# Patient Record
Sex: Male | Born: 1935
Health system: Southern US, Community
[De-identification: ages and names within clinical notes are randomized; demographics above are authoritative.]

## PROBLEM LIST (undated history)

## (undated) DIAGNOSIS — B029 Zoster without complications: Secondary | ICD-10-CM

## (undated) DIAGNOSIS — I499 Cardiac arrhythmia, unspecified: Secondary | ICD-10-CM

## (undated) DIAGNOSIS — I10 Essential (primary) hypertension: Secondary | ICD-10-CM

## (undated) DIAGNOSIS — E119 Type 2 diabetes mellitus without complications: Secondary | ICD-10-CM

## (undated) DIAGNOSIS — I480 Paroxysmal atrial fibrillation: Secondary | ICD-10-CM

## (undated) DIAGNOSIS — L0591 Pilonidal cyst without abscess: Secondary | ICD-10-CM

## (undated) DIAGNOSIS — I5032 Chronic diastolic (congestive) heart failure: Secondary | ICD-10-CM

## (undated) DIAGNOSIS — E7800 Pure hypercholesterolemia, unspecified: Secondary | ICD-10-CM

## (undated) DIAGNOSIS — H9190 Unspecified hearing loss, unspecified ear: Secondary | ICD-10-CM

## (undated) DIAGNOSIS — M1990 Unspecified osteoarthritis, unspecified site: Secondary | ICD-10-CM

## (undated) DIAGNOSIS — J189 Pneumonia, unspecified organism: Secondary | ICD-10-CM

## (undated) DIAGNOSIS — N400 Enlarged prostate without lower urinary tract symptoms: Secondary | ICD-10-CM

## (undated) DIAGNOSIS — M659 Synovitis and tenosynovitis, unspecified: Secondary | ICD-10-CM

## (undated) HISTORY — DX: Pilonidal cyst without abscess: L05.91

## (undated) HISTORY — PX: SHOULDER OPEN ROTATOR CUFF REPAIR: SHX2407

## (undated) HISTORY — PX: CHOLECYSTECTOMY OPEN: SUR202

## (undated) HISTORY — PX: VASECTOMY: SHX75

## (undated) HISTORY — PX: TONSILLECTOMY: SUR1361

## (undated) HISTORY — PX: JOINT REPLACEMENT: SHX530

## (undated) HISTORY — PX: EYE SURGERY: SHX253

## (undated) HISTORY — PX: APPENDECTOMY: SHX54

---

## 1999-11-03 ENCOUNTER — Encounter: Admission: RE | Admit: 1999-11-03 | Discharge: 1999-11-03 | Payer: Self-pay | Admitting: Orthopedic Surgery

## 1999-11-10 ENCOUNTER — Ambulatory Visit (HOSPITAL_BASED_OUTPATIENT_CLINIC_OR_DEPARTMENT_OTHER): Admission: RE | Admit: 1999-11-10 | Discharge: 1999-11-11 | Payer: Self-pay | Admitting: Orthopedic Surgery

## 2005-08-14 HISTORY — PX: COLONOSCOPY W/ BIOPSIES: SHX1374

## 2005-09-01 ENCOUNTER — Ambulatory Visit (HOSPITAL_COMMUNITY): Admission: RE | Admit: 2005-09-01 | Discharge: 2005-09-01 | Payer: Self-pay | Admitting: Gastroenterology

## 2005-09-01 ENCOUNTER — Encounter (INDEPENDENT_AMBULATORY_CARE_PROVIDER_SITE_OTHER): Payer: Self-pay | Admitting: *Deleted

## 2010-12-23 ENCOUNTER — Ambulatory Visit: Payer: Self-pay | Admitting: Cardiology

## 2011-01-01 ENCOUNTER — Telehealth (INDEPENDENT_AMBULATORY_CARE_PROVIDER_SITE_OTHER): Payer: Self-pay | Admitting: *Deleted

## 2011-01-05 ENCOUNTER — Encounter: Payer: Self-pay | Admitting: Internal Medicine

## 2011-01-05 ENCOUNTER — Encounter (HOSPITAL_COMMUNITY)
Admission: RE | Admit: 2011-01-05 | Discharge: 2011-01-13 | Payer: Self-pay | Source: Home / Self Care | Attending: Cardiology | Admitting: Cardiology

## 2011-01-05 ENCOUNTER — Ambulatory Visit: Admission: RE | Admit: 2011-01-05 | Discharge: 2011-01-05 | Payer: Self-pay | Source: Home / Self Care

## 2011-01-15 NOTE — Assessment & Plan Note (Signed)
Summary: Cardiology Nuclear Testing  Nuclear Med Background Indications for Stress Test: Evaluation for Ischemia    History Comments: No documented CAD  Symptoms: DOE    Nuclear Pre-Procedure Cardiac Risk Factors: Hypertension, Lipids Caffeine/Decaff Intake: none NPO After: 4:30 AM Lungs: Clear IV 0.9% NS with Angio Cath: 20g     IV Site: R Hand IV Started by: Cathlyn Parsons, RN Chest Size (in) 46     Height (in): 70 Weight (lb): 204 BMI: 29.38 Tech Comments: Patient held all diabetic meds this am.  BS 146 at 0920.    Nuclear Med Study 1 or 2 day study:  1 day     Stress Test Type:  Stress Reading MD:  Dietrich Pates, MD     Referring MD:  Cassell Clement, MD Resting Radionuclide:  Technetium 54m Tetrofosmin     Resting Radionuclide Dose:  10.7 mCi  Stress Radionuclide:  Technetium 44m Tetrofosmin     Stress Radionuclide Dose:  33.0 mCi   Stress Protocol Exercise Time (min):  4:15 min     Max HR:  137 bpm     Predicted Max HR:  135 bpm  Max Systolic BP: 180 mm Hg     Percent Max HR:  101.48 %     METS: 5.8 Rate Pressure Product:  84132    Stress Test Technologist:  Rea [employer], CMA-N     Nuclear Technologist:  Doyne Keel, CNMT  Rest Procedure  Myocardial perfusion imaging was performed at rest 45 minutes following the intravenous administration of Technetium 49m Tetrofosmin.  Stress Procedure  The patient exercised for 4:15.  The patient stopped due to fatigue and dyspnea .  He denied any chest pain.  There were diagnostic ST-T wave changes and occasional PAC's and fusion beats.  Technetium 41m Tetrofosmin was injected at peak exercise and myocardial perfusion imaging was performed after a brief delay.  QPS Raw Data Images:  Images were motion corrected.  Soft tissue (diaphragm, breast) surround heart. Stress Images:  Normal homogeneous uptake in all areas of the myocardium. Rest Images:  Normal homogeneous uptake in all areas of the myocardium. Subtraction (SDS):   Normal Transient Ischemic Dilatation:  1.04  (Normal <1.22)  Lung/Heart Ratio:  0.26  (Normal <0.45)  Quantitative Gated Spect Images QGS EDV:  102 ml QGS ESV:  44 ml QGS EF:  57 %   Overall Impression  Exercise Capacity: Poor exercise capacity. BP Response: Normal blood pressure response. Clinical Symptoms: No chest pain ECG Impression: 1 to 2 mm flat ST depression in lease II, III, AVF; V5, V6.  Near normalized by 1 min recovery Overall Impression: Electrically positive for ischemia.  Myoview with normal perfusion.  Appended Document: Cardiology Nuclear Testing copy sent brackbill

## 2011-01-15 NOTE — Progress Notes (Signed)
Summary: Nuc Pre-Procedure  Phone Note Outgoing Call Call back at [facility] Phone [phone]   Call placed by: Antionette Char RN,  January 01, 2011 2:37 PM Call placed to: Patient Reason for Call: Confirm/change Appt Summary of Call: Left message with information on Myoview Information Sheet (see scanned document for details).      Nuclear Med Background Indications for Stress Test: Evaluation for Ischemia     Symptoms: Dizziness, DOE, SOB    Nuclear Pre-Procedure Cardiac Risk Factors: Hypertension, Lipids

## 2011-04-18 ENCOUNTER — Emergency Department (HOSPITAL_COMMUNITY)
Admission: EM | Admit: 2011-04-18 | Discharge: 2011-04-18 | Disposition: A | Discharge status: Home / Self Care | Payer: Medicare Other | Attending: Emergency Medicine | Admitting: Emergency Medicine

## 2011-04-18 DIAGNOSIS — Z79899 Other long term (current) drug therapy: Secondary | ICD-10-CM | POA: Insufficient documentation

## 2011-04-18 DIAGNOSIS — M25529 Pain in unspecified elbow: Secondary | ICD-10-CM | POA: Insufficient documentation

## 2011-04-18 DIAGNOSIS — S5000XA Contusion of unspecified elbow, initial encounter: Secondary | ICD-10-CM | POA: Insufficient documentation

## 2011-04-18 DIAGNOSIS — Y9229 Other specified public building as the place of occurrence of the external cause: Secondary | ICD-10-CM | POA: Insufficient documentation

## 2011-04-18 DIAGNOSIS — E7800 Pure hypercholesterolemia, unspecified: Secondary | ICD-10-CM | POA: Insufficient documentation

## 2011-04-18 DIAGNOSIS — S60229A Contusion of unspecified hand, initial encounter: Secondary | ICD-10-CM | POA: Insufficient documentation

## 2011-04-18 DIAGNOSIS — S6990XA Unspecified injury of unspecified wrist, hand and finger(s), initial encounter: Secondary | ICD-10-CM | POA: Insufficient documentation

## 2011-04-18 DIAGNOSIS — IMO0002 Reserved for concepts with insufficient information to code with codable children: Secondary | ICD-10-CM | POA: Insufficient documentation

## 2011-04-18 DIAGNOSIS — S60219A Contusion of unspecified wrist, initial encounter: Secondary | ICD-10-CM | POA: Insufficient documentation

## 2011-04-18 DIAGNOSIS — S59909A Unspecified injury of unspecified elbow, initial encounter: Secondary | ICD-10-CM | POA: Insufficient documentation

## 2011-04-18 DIAGNOSIS — E119 Type 2 diabetes mellitus without complications: Secondary | ICD-10-CM | POA: Insufficient documentation

## 2011-04-18 DIAGNOSIS — I10 Essential (primary) hypertension: Secondary | ICD-10-CM | POA: Insufficient documentation

## 2011-04-18 LAB — GLUCOSE, CAPILLARY

## 2011-05-01 NOTE — Op Note (Signed)
NAMEMIRO, BALDERSON              ACCOUNT NO.:  [id number]   MEDICAL RECORD NO.:  [id number]          PATIENT TYPE:  AMB   LOCATION:  ENDO                         FACILITY:  [facility]   PHYSICIAN:  Graylin Shiver, M.D.   DATE OF BIRTH:  04-26-1924   DATE OF PROCEDURE:  09/01/2005  DATE OF DISCHARGE:                                 OPERATIVE REPORT   PROCEDURE:  Colonoscopy with biopsy.   INDICATIONS:  Screening.   Informed consent was obtained after explanation of the risks of bleeding,  infection and perforation.   PREMEDICATION:  Fentanyl 75 mcg IV, Versed 5 mg IV.   PROCEDURE:  With the patient in the left lateral decubitus position, a  rectal exam was performed.  No masses were felt.  The Olympus colonoscope  was inserted into the rectum and advanced around colon to the cecum.  Cecal  landmarks were identified.  The cecum looked normal.  In the descending  colon there were few scattered diverticula.  The transverse colon looked  normal.  The descending colon and sigmoid revealed moderate diverticulosis.  The rectum revealed a small 3 mm polyp that was biopsied off with cold  forceps.  He tolerated the procedure well without complications.   IMPRESSION:  1.  Diverticulosis.  2.  Small rectal polyp, biopsied off.   PLAN:  The pathology will be checked.           ______________________________  Graylin Shiver, M.D.     SFG/MEDQ  D:  09/01/2005  T:  09/01/2005  Job:  [id number]

## 2011-11-10 ENCOUNTER — Other Ambulatory Visit: Payer: Self-pay | Admitting: Orthopedic Surgery

## 2012-01-19 ENCOUNTER — Other Ambulatory Visit: Payer: Self-pay | Admitting: Orthopedic Surgery

## 2012-01-25 ENCOUNTER — Encounter (HOSPITAL_COMMUNITY): Payer: Self-pay | Admitting: Pharmacy Technician

## 2012-01-28 ENCOUNTER — Ambulatory Visit (HOSPITAL_COMMUNITY)
Admission: RE | Admit: 2012-01-28 | Discharge: 2012-01-28 | Disposition: A | Discharge status: Home / Self Care | Payer: Medicare Other | Source: Ambulatory Visit | Attending: Orthopedic Surgery | Admitting: Orthopedic Surgery

## 2012-01-28 ENCOUNTER — Encounter (HOSPITAL_COMMUNITY): Payer: Self-pay

## 2012-01-28 ENCOUNTER — Other Ambulatory Visit: Payer: Self-pay

## 2012-01-28 ENCOUNTER — Encounter (HOSPITAL_COMMUNITY)
Admission: RE | Admit: 2012-01-28 | Discharge: 2012-01-28 | Disposition: A | Discharge status: Home / Self Care | Payer: Medicare Other | Source: Ambulatory Visit | Attending: Orthopedic Surgery | Admitting: Orthopedic Surgery

## 2012-01-28 DIAGNOSIS — Z01818 Encounter for other preprocedural examination: Secondary | ICD-10-CM | POA: Insufficient documentation

## 2012-01-28 DIAGNOSIS — Z79899 Other long term (current) drug therapy: Secondary | ICD-10-CM | POA: Insufficient documentation

## 2012-01-28 DIAGNOSIS — E119 Type 2 diabetes mellitus without complications: Secondary | ICD-10-CM | POA: Insufficient documentation

## 2012-01-28 DIAGNOSIS — I10 Essential (primary) hypertension: Secondary | ICD-10-CM | POA: Insufficient documentation

## 2012-01-28 DIAGNOSIS — M1710 Unilateral primary osteoarthritis, unspecified knee: Secondary | ICD-10-CM | POA: Insufficient documentation

## 2012-01-28 DIAGNOSIS — Z01810 Encounter for preprocedural cardiovascular examination: Secondary | ICD-10-CM | POA: Insufficient documentation

## 2012-01-28 DIAGNOSIS — Z01812 Encounter for preprocedural laboratory examination: Secondary | ICD-10-CM | POA: Insufficient documentation

## 2012-01-28 HISTORY — DX: Pure hypercholesterolemia, unspecified: E78.00

## 2012-01-28 HISTORY — DX: Zoster without complications: B02.9

## 2012-01-28 HISTORY — DX: Benign prostatic hyperplasia without lower urinary tract symptoms: N40.0

## 2012-01-28 HISTORY — DX: Unspecified osteoarthritis, unspecified site: M19.90

## 2012-01-28 HISTORY — DX: Unspecified hearing loss, unspecified ear: H91.90

## 2012-01-28 LAB — CBC
Hemoglobin: 11.1 g/dL — ABNORMAL LOW (ref 13.0–17.0)
MCV: 92.3 fL (ref 78.0–100.0)
Platelets: 238 10*3/uL (ref 150–400)
RBC: 3.79 MIL/uL — ABNORMAL LOW (ref 4.22–5.81)
WBC: 5.8 10*3/uL (ref 4.0–10.5)

## 2012-01-28 LAB — COMPREHENSIVE METABOLIC PANEL
ALT: 18 U/L (ref 0–53)
AST: 20 U/L (ref 0–37)
CO2: 28 mEq/L (ref 19–32)
Chloride: 104 mEq/L (ref 96–112)
GFR calc non Af Amer: 49 mL/min — ABNORMAL LOW (ref >90)
Sodium: 140 mEq/L (ref 135–145)
Total Bilirubin: 0.7 mg/dL (ref 0.3–1.2)

## 2012-01-28 LAB — URINALYSIS, ROUTINE W REFLEX MICROSCOPIC
Bilirubin Urine: NEGATIVE
Glucose, UA: NEGATIVE mg/dL
Hgb urine dipstick: NEGATIVE
Ketones, ur: NEGATIVE mg/dL
Leukocytes, UA: NEGATIVE
pH: 5.5 (ref 5.0–8.0)

## 2012-01-28 LAB — SURGICAL PCR SCREEN: Staphylococcus aureus: NEGATIVE

## 2012-01-28 NOTE — Pre-Procedure Instructions (Signed)
PT'S BUN ELEVATED--FAITH AT DR. ALUISIO'S OFFICE NOTIFIED.

## 2012-01-28 NOTE — Pre-Procedure Instructions (Signed)
PREOP EKG, CXR, CBC, CMET, PT, PTT, UA WERE DONE TODAY AT [facility]. MEDICAL CLEARANCE FOR SURGERY ON CHART FROM DR. DONNA GATES. NUCLEAR STRESS TEST REPORT 01/05/11 ON CHART FROM [facility] "MYOVIEW WITH NORMAL PERFUSION"  "ELECTRICALLY POSITIVE FOR ISCHEMIA"--PT STATES HE WAS TOLD NO PROBLEMS WITH HIS HEART.

## 2012-01-28 NOTE — Patient Instructions (Signed)
20 Daniel Cooke  01/28/2012   Your procedure is scheduled on:  Monday 2/25  AT 7:15 AM  Report to [facility] at 5:15 AM.  Call this number if you have problems the morning of surgery: [phone]   Remember:DO NOT TAKE ANY DIABETIC MEDICATIONS THE AM OF YOUR SURGERY   Do not eat food OR DRINK ANYTHING AFTER MIDNIGHT THE NIGHT BEFORE YOUR SURGEY.    Take these medicines the morning of surgery with A SIP OF WATER: NO MEDICINES TO TAKE THE AM OF SURGERY.   Do not wear jewelry  Do not wear lotions    Do not bring valuables to the hospital.  Contacts, dentures or bridgework may not be worn into surgery.  Leave suitcase in the car. After surgery it may be brought to your room.  For patients admitted to the hospital, checkout time is 11:00 AM the day of discharge.   Patients discharged the day of surgery will not be allowed to drive home.    Special Instructions: CHG Shower Use Special Wash: 1/2 bottle night before surgery and 1/2 bottle morning of surgery.   Please read over the following fact sheets that you were given: Blood Transfusion Information and MRSA Information AND INCENTIVE SPIROMETER INFORMATION

## 2012-02-07 ENCOUNTER — Other Ambulatory Visit: Payer: Self-pay | Admitting: Orthopedic Surgery

## 2012-02-07 NOTE — H&P (Signed)
Daniel Cooke  DOB: June 13, 1924 Married / Language: English / Race: White / Male  Date of Admission:  02/08/2012  Chief Complaint:  Left knee pain  History of Present Illness  The patient is a 78 year old male who comes in today for a preoperative History and Physical. The patient is scheduled for a left total knee arthroplasty to be performed by Dr. Gus Rankin. Aluisio, MD at [facility] on 01/19/2012. The patient is a 78 year old male who presents with knee complaints. The patient is seen today for a second opinion. The patient reports left knee (worse) and right knee symptoms including: pain, swelling, instability, stiffness and soreness without any known injury.The patient feels that the symptoms are worsening. Previous work-up for this problem has included knee x-rays. Past treatment for this problem has included intra-articular injection of corticosteroids (and visco ). Mr. Maertens has been treated previously at [facility] by Dr. Farris Has. He has had multiple cortisone injections in both knees and multiple Visco supplement series in both knees. His left knee is currently more symptomatic than the right. It hurts at most times with most activities. Occasionally he has pain at night. It is limiting what he can and cannot do. He would like to go on walks, but doesn't. He has significant longevity in his family. His father lived past 100. His goal is to do the same. He feels like the knees are hampering him from being more active. He would like to proceed with surgery. They have been treated conservatively in the past for the above stated problem and despite conservative measures, they continue to have progressive pain and severe functional limitations and dysfunction. They have failed non-operative management including home exercise, medications, and injections. It is felt that they would benefit from undergoing total joint replacement. Risks and benefits of the procedure have  been discussed with the patient and they elect to proceed with surgery. There are no active contraindications to surgery such as ongoing infection or rapidly progressive neurological disease.  Allergies  Codeine/Codeine Derivatives. Nausea. "crazy"  Medication History  GlipiZIDE (10MG  Tablet, Oral) Active. MetFORMIN HCl (1000MG  Tablet, Oral) Active. Pioglitazone HCl (15MG  Tablet, Oral) Active. Terazosin HCl (5MG  Capsule, Oral) Active. Simvastatin (20MG  Tablet, Oral) Active. Lisinopril (20MG  Tablet, Oral) Active. Aspirin Adult Low Strength (81MG  Tablet DR, Oral) Active.  Past Surgical History  Gallbladder Surgery. open Rotator Cuff Repair. left Tonsillectomy Appendectomy Arthroscopy of Knee. right Cataract Surgery. left Vasectomy  Problem List/Past Medical  Osteoarthritis, Knee Shingles Measles Mumps Diabetes Mellitus, Type II Hypercholesterolemia  Family History  Diabetes Mellitus. mother Heart Disease. mother  Social History  Exercise. Exercises weekly; does running / walking Illicit drug use. no Living situation. live with spouse Current work status. working part time Drug/Alcohol Rehab (Currently). no Drug/Alcohol Rehab (Previously). no Tobacco use. never smoker Marital status. married Number of flights of stairs before winded. 2-3 Pain Contract. no Children. 3 Post-Surgical Plans. Plan to go home.  Review of Systems  General:Not Present- Chills, Fever, Night Sweats, Fatigue, Weight Gain, Weight Loss and Memory Loss. Skin:Not Present- Hives, Itching, Rash, Eczema and Lesions. HEENT:Not Present- Tinnitus, Headache, Double Vision, Visual Loss, Hearing Loss and Dentures. Respiratory:Not Present- Shortness of breath with exertion, Shortness of breath at rest, Allergies, Coughing up blood and Chronic Cough. Cardiovascular:Not Present- Chest Pain, Racing/skipping heartbeats, Difficulty Breathing Lying Down, Murmur, Swelling and  Palpitations. Gastrointestinal:Not Present- Bloody Stool, Heartburn, Abdominal Pain, Vomiting, Nausea, Constipation, Diarrhea, Difficulty Swallowing, Jaundice and Loss of appetitie.  Male Genitourinary:Not Present- Urinary frequency, Blood in Urine, Weak urinary stream, Discharge, Flank Pain, Incontinence, Painful Urination, Urgency, Urinary Retention and Urinating at Night. Musculoskeletal:Not Present- Muscle Weakness, Muscle Pain, Joint Swelling, Joint Pain, Back Pain, Morning Stiffness and Spasms. Neurological:Not Present- Tremor, Dizziness, Blackout spells, Paralysis, Difficulty with balance and Weakness. Psychiatric:Not Present- Insomnia.  Vitals  Weight: 209 lb Height: 70 in Body Surface Area: 2.16 m Body Mass Index: 29.99 kg/m Pulse: 78 (Regular) Resp.: 20 (Unlabored) BP: 136/54 (Sitting, Left Arm, Standard)  Physical Exam The physical exam findings are as follows: Patient is an 78 year old male with continued knee pain. Patient is accompanied today by his wife.  General Mental Status - Alert, cooperative and good historian. General Appearance- pleasant. Not in acute distress. Orientation- Oriented X3. Build & Nutrition- Well nourished and Well developed. Mental Status- cooperative.  Head and Neck Head- normocephalic, atraumatic . Neck Global Assessment- bruit auscultated on the right, bruit auscultated on the left and supple. Carotid Arteries- Left- bruit (scant trace). Right- bruit (faint).  Eye Pupil- Bilateral- Regular and Round. Motion- Bilateral- EOMI. wears glasses Ears  Bilateral hearing aids  Chest and Lung Exam Auscultation: Breath sounds:- clear at anterior chest wall and - clear at posterior chest wall. Adventitious sounds:- No Adventitious sounds.  Cardiovascular Auscultation:Rhythm- Regular rate and rhythm. Heart Sounds- S1 WNL and S2 WNL. Murmurs & Other Heart Sounds: Murmur 1:Location- Aortic Area and  Sternal Border - Left. Timing- Early systolic. Grade- II/VI. Character- Low pitched.  Abdomen Palpation/Percussion:Tenderness- Abdomen is non-tender to palpation. Rigidity (guarding)- Abdomen is soft. Auscultation:Auscultation of the abdomen reveals - Bowel sounds normal.  Male Genitourinary  Not done, not pertinent to present illness  Musculoskeletal Well developed male, alert and oriented in no apparent distress. Evaluation of his hips, normal range of motion with no discomfort. Both knees show no effusion. Left knee shows varus deformity. Range is about 7 to 110. There is marked crepitus on range of motion of the knee. He is somewhat tender medial greater than lateral. There is no instability noted. Pulse, sensation, and motor are intact. Right knee - no effusion. No deformity. Range is 5 to 125, moderate crepitus on range of motion, tender medial greater than lateral. No instability.  RADIOGRAPHS: AP of both knees and lateral show both knees have advanced arthritis, worse certainly on the left than the right. He has bone on bone medial and patellofemoral of both knees with varus deformities and with large osteophytes present.  Assessment & Plan Osteoarthritis, Knee Impression: Left greater than Right Knee  Patient is for a Left Total Knee Replacement and a Right Knee Cortisone Injection by Dr. Lequita Halt.  Plan is to go home after the hospital stay.  PCP - Dr. Shaune Pollack - Patient has been seen preoperatively by Dr. Kevan Ny and felt to be stable for surgery. Dr. Kevan Ny recommended that he not take his diabetic meds the day of surgery.  Avel Peace, PA-C

## 2012-02-08 ENCOUNTER — Encounter (HOSPITAL_COMMUNITY): Payer: Self-pay | Admitting: Anesthesiology

## 2012-02-08 ENCOUNTER — Encounter (HOSPITAL_COMMUNITY): Payer: Self-pay

## 2012-02-08 ENCOUNTER — Encounter (HOSPITAL_COMMUNITY)
Admission: RE | Disposition: A | Discharge status: Home Health | Payer: Self-pay | Source: Ambulatory Visit | Attending: Orthopedic Surgery

## 2012-02-08 ENCOUNTER — Inpatient Hospital Stay (HOSPITAL_COMMUNITY): Payer: Medicare Other | Admitting: Anesthesiology

## 2012-02-08 ENCOUNTER — Inpatient Hospital Stay (HOSPITAL_COMMUNITY)
Admission: RE | Admit: 2012-02-08 | Discharge: 2012-02-11 | DRG: 470 | Disposition: A | Discharge status: Home Health | Payer: Medicare Other | Source: Ambulatory Visit | Attending: Orthopedic Surgery | Admitting: Orthopedic Surgery

## 2012-02-08 ENCOUNTER — Other Ambulatory Visit: Payer: Self-pay

## 2012-02-08 DIAGNOSIS — D62 Acute posthemorrhagic anemia: Secondary | ICD-10-CM | POA: Diagnosis not present

## 2012-02-08 DIAGNOSIS — H9190 Unspecified hearing loss, unspecified ear: Secondary | ICD-10-CM | POA: Diagnosis present

## 2012-02-08 DIAGNOSIS — R112 Nausea with vomiting, unspecified: Secondary | ICD-10-CM | POA: Diagnosis not present

## 2012-02-08 DIAGNOSIS — Z96659 Presence of unspecified artificial knee joint: Secondary | ICD-10-CM

## 2012-02-08 DIAGNOSIS — E119 Type 2 diabetes mellitus without complications: Secondary | ICD-10-CM | POA: Diagnosis present

## 2012-02-08 DIAGNOSIS — R339 Retention of urine, unspecified: Secondary | ICD-10-CM | POA: Diagnosis not present

## 2012-02-08 DIAGNOSIS — M1710 Unilateral primary osteoarthritis, unspecified knee: Principal | ICD-10-CM | POA: Diagnosis present

## 2012-02-08 DIAGNOSIS — R7989 Other specified abnormal findings of blood chemistry: Secondary | ICD-10-CM | POA: Diagnosis not present

## 2012-02-08 DIAGNOSIS — M179 Osteoarthritis of knee, unspecified: Secondary | ICD-10-CM | POA: Diagnosis present

## 2012-02-08 DIAGNOSIS — I10 Essential (primary) hypertension: Secondary | ICD-10-CM | POA: Diagnosis present

## 2012-02-08 HISTORY — PX: TOTAL KNEE ARTHROPLASTY: SHX125

## 2012-02-08 LAB — ABO/RH: ABO/RH(D): A POS

## 2012-02-08 LAB — GLUCOSE, CAPILLARY
Glucose-Capillary: 73 mg/dL (ref 70–99)
Glucose-Capillary: 233 mg/dL — ABNORMAL HIGH (ref 70–99)

## 2012-02-08 LAB — TYPE AND SCREEN

## 2012-02-08 SURGERY — ARTHROPLASTY, KNEE, TOTAL
Anesthesia: Spinal | Site: Knee | Laterality: Left | Wound class: Clean

## 2012-02-08 MED ORDER — WARFARIN VIDEO
Freq: Once | Status: AC
  Administered 2012-02-09: 12:00:00

## 2012-02-08 MED ORDER — ACETAMINOPHEN 325 MG PO TABS
650.0000 mg | ORAL_TABLET | Freq: Four times a day (QID) | ORAL | Status: DC | PRN
  Administered 2012-02-09: 650 mg via ORAL
  Filled 2012-02-08: qty 2

## 2012-02-08 MED ORDER — CEFAZOLIN SODIUM 1-5 GM-% IV SOLN
1.0000 g | Freq: Four times a day (QID) | INTRAVENOUS | Status: AC
  Administered 2012-02-08 – 2012-02-09 (×3): 1 g via INTRAVENOUS
  Filled 2012-02-08 (×3): qty 50

## 2012-02-08 MED ORDER — WARFARIN SODIUM 2.5 MG PO TABS
2.5000 mg | ORAL_TABLET | Freq: Once | ORAL | Status: AC
  Administered 2012-02-08: 2.5 mg via ORAL
  Filled 2012-02-08: qty 1

## 2012-02-08 MED ORDER — INSULIN ASPART 100 UNIT/ML ~~LOC~~ SOLN
0.0000 [IU] | Freq: Three times a day (TID) | SUBCUTANEOUS | Status: DC
  Administered 2012-02-08: 5 [IU] via SUBCUTANEOUS
  Administered 2012-02-08 – 2012-02-09 (×3): 2 [IU] via SUBCUTANEOUS
  Administered 2012-02-09 – 2012-02-10 (×3): 3 [IU] via SUBCUTANEOUS
  Administered 2012-02-11 (×2): 2 [IU] via SUBCUTANEOUS
  Filled 2012-02-08: qty 3

## 2012-02-08 MED ORDER — MENTHOL 3 MG MT LOZG
1.0000 | LOZENGE | OROMUCOSAL | Status: DC | PRN
  Filled 2012-02-08: qty 9

## 2012-02-08 MED ORDER — LACTATED RINGERS IV SOLN
INTRAVENOUS | Status: DC | PRN
  Administered 2012-02-08 (×2): via INTRAVENOUS

## 2012-02-08 MED ORDER — ACETAMINOPHEN 10 MG/ML IV SOLN
1000.0000 mg | Freq: Four times a day (QID) | INTRAVENOUS | Status: AC
  Administered 2012-02-08 – 2012-02-09 (×4): 1000 mg via INTRAVENOUS
  Filled 2012-02-08 (×4): qty 100

## 2012-02-08 MED ORDER — SODIUM CHLORIDE 0.9 % IV SOLN: INTRAVENOUS | Status: DC

## 2012-02-08 MED ORDER — TRAMADOL HCL 50 MG PO TABS
50.0000 mg | ORAL_TABLET | Freq: Four times a day (QID) | ORAL | Status: DC | PRN
  Administered 2012-02-09 – 2012-02-10 (×3): 100 mg via ORAL
  Administered 2012-02-10: 50 mg via ORAL
  Administered 2012-02-10 – 2012-02-11 (×3): 100 mg via ORAL
  Filled 2012-02-08 (×3): qty 2
  Filled 2012-02-08: qty 1
  Filled 2012-02-08 (×3): qty 2

## 2012-02-08 MED ORDER — FENTANYL CITRATE 0.05 MG/ML IJ SOLN
INTRAMUSCULAR | Status: DC | PRN
  Administered 2012-02-08: 50 ug via INTRAVENOUS

## 2012-02-08 MED ORDER — DOCUSATE SODIUM 100 MG PO CAPS
100.0000 mg | ORAL_CAPSULE | Freq: Two times a day (BID) | ORAL | Status: DC
  Administered 2012-02-08 – 2012-02-11 (×7): 100 mg via ORAL
  Filled 2012-02-08 (×8): qty 1

## 2012-02-08 MED ORDER — SIMVASTATIN 20 MG PO TABS
20.0000 mg | ORAL_TABLET | Freq: Every day | ORAL | Status: DC
  Administered 2012-02-08 – 2012-02-10 (×3): 20 mg via ORAL
  Filled 2012-02-08 (×4): qty 1

## 2012-02-08 MED ORDER — FLEET ENEMA 7-19 GM/118ML RE ENEM: 1.0000 | ENEMA | Freq: Once | RECTAL | Status: AC | PRN

## 2012-02-08 MED ORDER — METFORMIN HCL 500 MG PO TABS
1000.0000 mg | ORAL_TABLET | Freq: Two times a day (BID) | ORAL | Status: DC
  Administered 2012-02-08: 1000 mg via ORAL
  Filled 2012-02-08 (×3): qty 2

## 2012-02-08 MED ORDER — SODIUM CHLORIDE 0.9 % IV SOLN
INTRAVENOUS | Status: DC
  Administered 2012-02-08 – 2012-02-10 (×4): via INTRAVENOUS

## 2012-02-08 MED ORDER — PIOGLITAZONE HCL 15 MG PO TABS
15.0000 mg | ORAL_TABLET | Freq: Every day | ORAL | Status: DC
  Administered 2012-02-08 – 2012-02-10 (×3): 15 mg via ORAL
  Filled 2012-02-08 (×4): qty 1

## 2012-02-08 MED ORDER — BUPIVACAINE IN DEXTROSE 0.75-8.25 % IT SOLN
INTRATHECAL | Status: DC | PRN
  Administered 2012-02-08: 2 mL via INTRATHECAL

## 2012-02-08 MED ORDER — POLYETHYLENE GLYCOL 3350 17 G PO PACK
17.0000 g | PACK | Freq: Every day | ORAL | Status: DC | PRN
  Filled 2012-02-08: qty 1

## 2012-02-08 MED ORDER — METOCLOPRAMIDE HCL 5 MG/ML IJ SOLN
5.0000 mg | Freq: Three times a day (TID) | INTRAMUSCULAR | Status: DC | PRN
  Administered 2012-02-08 – 2012-02-09 (×2): 10 mg via INTRAVENOUS
  Filled 2012-02-08 (×2): qty 2

## 2012-02-08 MED ORDER — BISACODYL 10 MG RE SUPP: 10.0000 mg | Freq: Every day | RECTAL | Status: DC | PRN

## 2012-02-08 MED ORDER — ACETAMINOPHEN 10 MG/ML IV SOLN
1000.0000 mg | Freq: Once | INTRAVENOUS | Status: AC
  Administered 2012-02-08: 1000 mg via INTRAVENOUS

## 2012-02-08 MED ORDER — LACTATED RINGERS IV SOLN: INTRAVENOUS | Status: DC

## 2012-02-08 MED ORDER — DIPHENHYDRAMINE HCL 12.5 MG/5ML PO ELIX: 12.5000 mg | ORAL_SOLUTION | ORAL | Status: DC | PRN

## 2012-02-08 MED ORDER — FENTANYL CITRATE 0.05 MG/ML IJ SOLN: 25.0000 ug | INTRAMUSCULAR | Status: DC | PRN

## 2012-02-08 MED ORDER — PHENOL 1.4 % MT LIQD
1.0000 | OROMUCOSAL | Status: DC | PRN
  Filled 2012-02-08: qty 177

## 2012-02-08 MED ORDER — CEFAZOLIN SODIUM-DEXTROSE 2-3 GM-% IV SOLR
2.0000 g | Freq: Once | INTRAVENOUS | Status: AC
  Administered 2012-02-08: 2 g via INTRAVENOUS

## 2012-02-08 MED ORDER — TERAZOSIN HCL 5 MG PO CAPS
5.0000 mg | ORAL_CAPSULE | Freq: Every day | ORAL | Status: DC
  Administered 2012-02-08 – 2012-02-10 (×3): 5 mg via ORAL
  Filled 2012-02-08 (×4): qty 1

## 2012-02-08 MED ORDER — ONDANSETRON HCL 4 MG PO TABS: 4.0000 mg | ORAL_TABLET | Freq: Four times a day (QID) | ORAL | Status: DC | PRN

## 2012-02-08 MED ORDER — BUPIVACAINE ON-Q PAIN PUMP (FOR ORDER SET NO CHG)
INJECTION | Status: DC
  Filled 2012-02-08: qty 1

## 2012-02-08 MED ORDER — GLIPIZIDE 10 MG PO TABS
10.0000 mg | ORAL_TABLET | Freq: Two times a day (BID) | ORAL | Status: DC
  Administered 2012-02-08 – 2012-02-11 (×6): 10 mg via ORAL
  Filled 2012-02-08 (×7): qty 1

## 2012-02-08 MED ORDER — PROMETHAZINE HCL 25 MG/ML IJ SOLN: 6.2500 mg | INTRAMUSCULAR | Status: DC | PRN

## 2012-02-08 MED ORDER — TEMAZEPAM 15 MG PO CAPS: 15.0000 mg | ORAL_CAPSULE | Freq: Every evening | ORAL | Status: DC | PRN

## 2012-02-08 MED ORDER — METHOCARBAMOL 500 MG PO TABS
500.0000 mg | ORAL_TABLET | Freq: Four times a day (QID) | ORAL | Status: DC | PRN
  Administered 2012-02-09 – 2012-02-11 (×7): 500 mg via ORAL
  Filled 2012-02-08 (×7): qty 1

## 2012-02-08 MED ORDER — PROMETHAZINE HCL 25 MG/ML IJ SOLN
12.5000 mg | Freq: Four times a day (QID) | INTRAMUSCULAR | Status: DC | PRN
Start: 2012-02-08 — End: 2012-02-11
  Administered 2012-02-08: 12.5 mg via INTRAVENOUS
  Filled 2012-02-08: qty 1

## 2012-02-08 MED ORDER — HYDROMORPHONE HCL 2 MG PO TABS
2.0000 mg | ORAL_TABLET | ORAL | Status: DC | PRN
  Administered 2012-02-08: 2 mg via ORAL
  Filled 2012-02-08: qty 1

## 2012-02-08 MED ORDER — CHLORHEXIDINE GLUCONATE 4 % EX LIQD: 60.0000 mL | Freq: Once | CUTANEOUS | Status: DC

## 2012-02-08 MED ORDER — ACETAMINOPHEN 650 MG RE SUPP: 650.0000 mg | Freq: Four times a day (QID) | RECTAL | Status: DC | PRN

## 2012-02-08 MED ORDER — HYDROMORPHONE HCL PF 1 MG/ML IJ SOLN
0.5000 mg | INTRAMUSCULAR | Status: DC | PRN
  Administered 2012-02-08: 0.5 mg via INTRAVENOUS
  Administered 2012-02-08 (×2): 1 mg via INTRAVENOUS
  Filled 2012-02-08 (×3): qty 1

## 2012-02-08 MED ORDER — SODIUM CHLORIDE 0.9 % IR SOLN
Status: DC | PRN
  Administered 2012-02-08: 3000 mL

## 2012-02-08 MED ORDER — BUPIVACAINE 0.25 % ON-Q PUMP SINGLE CATH 300ML: 300.0000 mL | INJECTION | Status: DC

## 2012-02-08 MED ORDER — METHOCARBAMOL 100 MG/ML IJ SOLN
500.0000 mg | Freq: Four times a day (QID) | INTRAVENOUS | Status: DC | PRN
  Administered 2012-02-08 (×2): 500 mg via INTRAVENOUS
  Filled 2012-02-08 (×2): qty 5

## 2012-02-08 MED ORDER — METOCLOPRAMIDE HCL 10 MG PO TABS: 5.0000 mg | ORAL_TABLET | Freq: Three times a day (TID) | ORAL | Status: DC | PRN

## 2012-02-08 MED ORDER — BUPIVACAINE 0.25 % ON-Q PUMP SINGLE CATH 300ML
INJECTION | Status: DC | PRN
  Administered 2012-02-08: 300 mL

## 2012-02-08 MED ORDER — EPHEDRINE SULFATE 50 MG/ML IJ SOLN
INTRAMUSCULAR | Status: DC | PRN
  Administered 2012-02-08: 5 mg via INTRAVENOUS

## 2012-02-08 MED ORDER — ONDANSETRON HCL 4 MG/2ML IJ SOLN
4.0000 mg | Freq: Four times a day (QID) | INTRAMUSCULAR | Status: DC | PRN
  Administered 2012-02-08 – 2012-02-09 (×2): 4 mg via INTRAVENOUS
  Filled 2012-02-08 (×2): qty 2

## 2012-02-08 MED ORDER — DEXAMETHASONE SODIUM PHOSPHATE 10 MG/ML IJ SOLN
10.0000 mg | Freq: Once | INTRAMUSCULAR | Status: AC
  Administered 2012-02-08: 10 mg via INTRAVENOUS

## 2012-02-08 MED ORDER — ZOLPIDEM TARTRATE 5 MG PO TABS
5.0000 mg | ORAL_TABLET | Freq: Every evening | ORAL | Status: DC | PRN
Start: 2012-02-08 — End: 2012-02-11

## 2012-02-08 MED ORDER — PROPOFOL 10 MG/ML IV EMUL
INTRAVENOUS | Status: DC | PRN
  Administered 2012-02-08: 75 ug/kg/min via INTRAVENOUS

## 2012-02-08 MED ORDER — COUMADIN BOOK
Freq: Once | Status: AC
  Administered 2012-02-08: 18:00:00
  Filled 2012-02-08: qty 1

## 2012-02-08 MED ORDER — SCOPOLAMINE 1 MG/3DAYS TD PT72
1.0000 | MEDICATED_PATCH | TRANSDERMAL | Status: DC
  Administered 2012-02-08: 1.5 mg via TRANSDERMAL
  Filled 2012-02-08: qty 1

## 2012-02-08 SURGICAL SUPPLY — 52 items
BAG SPEC THK2 15X12 ZIP CLS (MISCELLANEOUS) ×1
BAG ZIPLOCK 12X15 (MISCELLANEOUS) ×2 IMPLANT
BANDAGE ELASTIC 6 VELCRO ST LF (GAUZE/BANDAGES/DRESSINGS) ×2 IMPLANT
BANDAGE ESMARK 6X9 LF (GAUZE/BANDAGES/DRESSINGS) ×1 IMPLANT
BLADE SAG 18X100X1.27 (BLADE) ×2 IMPLANT
BLADE SAW SGTL 11.0X1.19X90.0M (BLADE) ×2 IMPLANT
BNDG CMPR 9X6 STRL LF SNTH (GAUZE/BANDAGES/DRESSINGS) ×1
BNDG ESMARK 6X9 LF (GAUZE/BANDAGES/DRESSINGS) ×2
BOWL SMART MIX CTS (DISPOSABLE) ×2 IMPLANT
CATH KIT ON-Q SILVERSOAK 5 (CATHETERS) ×1 IMPLANT
CATH KIT ON-Q SILVERSOAK 5IN (CATHETERS) ×2 IMPLANT
CEMENT HV SMART SET (Cement) ×3 IMPLANT
CLOTH BEACON ORANGE TIMEOUT ST (SAFETY) ×2 IMPLANT
CUFF TOURN SGL QUICK 34 (TOURNIQUET CUFF) ×2
CUFF TRNQT CYL 34X4X40X1 (TOURNIQUET CUFF) ×1 IMPLANT
DRAPE EXTREMITY T 121X128X90 (DRAPE) ×2 IMPLANT
DRAPE POUCH INSTRU U-SHP 10X18 (DRAPES) ×2 IMPLANT
DRAPE U-SHAPE 47X51 STRL (DRAPES) ×2 IMPLANT
DRSG ADAPTIC 3X8 NADH LF (GAUZE/BANDAGES/DRESSINGS) ×2 IMPLANT
DURAPREP 26ML APPLICATOR (WOUND CARE) ×2 IMPLANT
ELECT REM PT RETURN 9FT ADLT (ELECTROSURGICAL) ×2
ELECTRODE REM PT RTRN 9FT ADLT (ELECTROSURGICAL) ×1 IMPLANT
EVACUATOR 1/8 PVC DRAIN (DRAIN) ×2 IMPLANT
FACESHIELD LNG OPTICON STERILE (SAFETY) ×10 IMPLANT
GLOVE BIO SURGEON STRL SZ7.5 (GLOVE) ×2 IMPLANT
GLOVE BIO SURGEON STRL SZ8 (GLOVE) ×2 IMPLANT
GLOVE BIOGEL PI IND STRL 8 (GLOVE) ×2 IMPLANT
GLOVE BIOGEL PI INDICATOR 8 (GLOVE) ×2
GOWN STRL NON-REIN LRG LVL3 (GOWN DISPOSABLE) ×2 IMPLANT
GOWN STRL REIN XL XLG (GOWN DISPOSABLE) ×2 IMPLANT
HANDPIECE INTERPULSE COAX TIP (DISPOSABLE) ×2
IMMOBILIZER KNEE 20 (SOFTGOODS) ×2
IMMOBILIZER KNEE 20 THIGH 36 (SOFTGOODS) ×1 IMPLANT
KIT BASIN OR (CUSTOM PROCEDURE TRAY) ×2 IMPLANT
MANIFOLD NEPTUNE II (INSTRUMENTS) ×2 IMPLANT
NS IRRIG 1000ML POUR BTL (IV SOLUTION) ×2 IMPLANT
PACK TOTAL JOINT (CUSTOM PROCEDURE TRAY) ×2 IMPLANT
PAD ABD 7.5X8 STRL (GAUZE/BANDAGES/DRESSINGS) ×2 IMPLANT
PADDING CAST COTTON 6X4 STRL (CAST SUPPLIES) ×6 IMPLANT
POSITIONER SURGICAL ARM (MISCELLANEOUS) ×2 IMPLANT
SET HNDPC FAN SPRY TIP SCT (DISPOSABLE) ×1 IMPLANT
SPONGE GAUZE 4X4 12PLY (GAUZE/BANDAGES/DRESSINGS) ×2 IMPLANT
STRIP CLOSURE SKIN 1/2X4 (GAUZE/BANDAGES/DRESSINGS) ×4 IMPLANT
SUCTION FRAZIER 12FR DISP (SUCTIONS) ×2 IMPLANT
SUT MNCRL AB 4-0 PS2 18 (SUTURE) ×2 IMPLANT
SUT PDS AB 1 CT1 27 (SUTURE) ×6 IMPLANT
SUT VIC AB 2-0 CT1 27 (SUTURE) ×6
SUT VIC AB 2-0 CT1 TAPERPNT 27 (SUTURE) ×3 IMPLANT
TOWEL OR 17X26 10 PK STRL BLUE (TOWEL DISPOSABLE) ×4 IMPLANT
TRAY FOLEY CATH 14FRSI W/METER (CATHETERS) ×2 IMPLANT
WATER STERILE IRR 1500ML POUR (IV SOLUTION) ×2 IMPLANT
WRAP KNEE MAXI GEL POST OP (GAUZE/BANDAGES/DRESSINGS) ×4 IMPLANT

## 2012-02-08 NOTE — Anesthesia Preprocedure Evaluation (Signed)
Anesthesia Evaluation  Patient identified by MRN, date of birth, ID band Patient awake    Reviewed: Allergy & Precautions, H&P , NPO status , Patient's Chart, lab work & pertinent test results  History of Anesthesia Complications Negative for: history of anesthetic complications  Airway Mallampati: II TM Distance: >3 FB Neck ROM: Full    Dental  (+) Teeth Intact and Caps Patient grinds teeth: all upper and lower incisors ground down:   Pulmonary neg pulmonary ROS,  clear to auscultation  Pulmonary exam normal       Cardiovascular hypertension, Pt. on medications Regular Normal    Neuro/Psych Hard of hearing Negative Neurological ROS  Negative Psych ROS   GI/Hepatic negative GI ROS, Neg liver ROS,   Endo/Other  Diabetes mellitus-, Well Controlled, Type 2, Oral Hypoglycemic Agents  Renal/GU negative Renal ROS   BPH Genitourinary negative   Musculoskeletal negative musculoskeletal ROS (+)   Abdominal   Peds negative pediatric ROS (+)  Hematology negative hematology ROS (+)   Anesthesia Other Findings   Reproductive/Obstetrics negative OB ROS                           Anesthesia Physical Anesthesia Plan  ASA: II  Anesthesia Plan: Spinal   Post-op Pain Management:    Induction: Intravenous  Airway Management Planned: Simple Face Mask  Additional Equipment:   Intra-op Plan:   Post-operative Plan:   Informed Consent: I have reviewed the patients History and Physical, chart, labs and discussed the procedure including the risks, benefits and alternatives for the proposed anesthesia with the patient or authorized representative who has indicated his/her understanding and acceptance.   Dental advisory given  Plan Discussed with: CRNA  Anesthesia Plan Comments:         Anesthesia Quick Evaluation

## 2012-02-08 NOTE — Op Note (Signed)
Pre-operative diagnosis- Osteoarthritis  Left knee(s)  Post-operative diagnosis- Osteoarthritis Left knee(s)  Procedure-  Left  Total Knee Arthroplasty  Surgeon- Daniel Rankin. Eliana Lueth, MD  Assistant- Avel Peace, PA-C   Anesthesia-  Spinal EBL-* No blood loss amount entered *  Drains Hemovac  Tourniquet time-  Total Tourniquet Time Documented: Thigh (Left) - 31 minutes   Complications- None  Condition-PACU - hemodynamically stable.   Brief Clinical Note   Daniel Cooke is a 78 y.o. year old male with end stage OA of his left knee with progressively worsening pain and dysfunction. He has constant pain, with activity and at rest and significant functional deficits with difficulties even with ADLs. He has had extensive non-op management including analgesics, injections of cortisone and viscosupplements, and home exercise program, but remains in significant pain with significant dysfunction. Radiographs show bone on bone arthritis medial and patellofemoral with varus deformity. He presents now for left Total Knee Arthroplasty.    Procedure in detail---   The patient is brought into the operating room and positioned supine on the operating table. After successful administration of  Spinal,   a tourniquet is placed high on the  Left thigh(s) and the lower extremity is prepped and draped in the usual sterile fashion. Time out is performed by the operating team and then the  Left lower extremity is wrapped in Esmarch, knee flexed and the tourniquet inflated to 300 mmHg.       A midline incision is made with a ten blade through the subcutaneous tissue to the level of the extensor mechanism. A fresh blade is used to make a medial parapatellar arthrotomy. Soft tissue over the proximal medial tibia is subperiosteally elevated to the joint line with a knife and into the semimembranosus bursa with a Cobb elevator. Soft tissue over the proximal lateral tibia is elevated with attention being paid to  avoiding the patellar tendon on the tibial tubercle. The patella is everted, knee flexed 90 degrees and the ACL and PCL are removed. Findings are bone on bone medial and patellofemoral with large osteophytes.        The drill is used to create a starting hole in the distal femur and the canal is thoroughly irrigated with sterile saline to remove the fatty contents. The 5 degree Left  valgus alignment guide is placed into the femoral canal and the distal femoral cutting block is pinned to remove 11 mm off the distal femur. Resection is made with an oscillating saw.      The tibia is subluxed forward and the menisci are removed. The extramedullary alignment guide is placed referencing proximally at the medial aspect of the tibial tubercle and distally along the second metatarsal axis and tibial crest. The block is pinned to remove 2mm off the more deficient medial  side. Resection is made with an oscillating saw. Size 4is the most appropriate size for the tibia and the proximal tibia is prepared with the modular drill and keel punch for that size.      The femoral sizing guide is placed and size 4 is most appropriate. Rotation is marked off the epicondylar axis and confirmed by creating a rectangular flexion gap at 90 degrees. The size 4 cutting block is pinned in this rotation and the anterior, posterior and chamfer cuts are made with the oscillating saw. The intercondylar block is then placed and that cut is made.      Trial size 4 tibial component, trial size 4 posterior stabilized femur and  a 12.5  mm posterior stabilized rotating platform insert trial is placed. Full extension is achieved with excellent varus/valgus and anterior/posterior balance throughout full range of motion. The patella is everted and thickness measured to be 24  mm. Free hand resection is taken to 14 mm, a 38 template is placed, lug holes are drilled, trial patella is placed, and it tracks normally. Osteophytes are removed off the  posterior femur with the trial in place. All trials are removed and the cut bone surfaces prepared with pulsatile lavage. Cement is mixed and once ready for implantation, the size 4 tibial implant, size  4 posterior stabilized femoral component, and the size 38 patella are cemented in place and the patella is held with the clamp. The trial insert is placed and the knee held in full extension. All extruded cement is removed and once the cement is hard the permanent 12.5 mm posterior stabilized rotating platform insert is placed into the tibial tray.      The wound is copiously irrigated with saline solution and the extensor mechanism closed over a hemovac drain with #1 PDS suture. The tourniquet is released for a total tourniquet time of 31  minutes. Flexion against gravity is 135 degrees and the patella tracks normally. Subcutaneous tissue is closed with 2.0 vicryl and subcuticular with running 4.0 Monocryl. The catheter for the Marcaine pain pump is placed and the pump is initiated. The incision is cleaned and dried and steri-strips and a bulky sterile dressing are applied. The limb is placed into a knee immobilizer and the patient is awakened and transported to recovery in stable condition.      Please note that a surgical assistant was a medical necessity for this procedure in order to perform it in a safe and expeditious manner. Surgical assistant was necessary to retract the ligaments and vital neurovascular structures to prevent injury to them and also necessary for proper positioning of the limb to allow for anatomic placement of the prosthesis.   Daniel Rankin Ezriel Boffa, MD    02/08/2012, 8:19 AM

## 2012-02-08 NOTE — Interval H&P Note (Signed)
History and Physical Interval Note:  02/08/2012 6:59 AM  Daniel Cooke  has presented today for surgery, with the diagnosis of Osteoarthritis of the Left Knee  The various methods of treatment have been discussed with the patient and family. After consideration of risks, benefits and other options for treatment, the patient has consented to  Procedure(s) (LRB): TOTAL KNEE ARTHROPLASTY (Left) as a surgical intervention .  The patients' history has been reviewed, patient examined, no change in status, stable for surgery.  I have reviewed the patients' chart and labs.  Questions were answered to the patient's satisfaction.     [name]

## 2012-02-08 NOTE — Anesthesia Procedure Notes (Signed)
Spinal  Patient location during procedure: OR Start time: 02/08/2012 7:20 AM End time: 02/08/2012 7:25 AM Staffing Anesthesiologist: Lucille Passy F Performed by: anesthesiologist  Preanesthetic Checklist Completed: patient identified, site marked, surgical consent, pre-op evaluation, timeout performed, IV checked, risks and benefits discussed and monitors and equipment checked Spinal Block Patient position: sitting Prep: Betadine Patient monitoring: heart rate, continuous pulse ox and blood pressure Approach: midline Location: L3-4 Injection technique: single-shot Needle Needle type: Quincke  Needle gauge: 22 G Needle length: 9 cm Additional Notes Expiration date of kit checked and confirmed. Patient tolerated procedure well, without complications.  Lot [id number] DOE 05/2013

## 2012-02-08 NOTE — Progress Notes (Signed)
Utilization review completed.  

## 2012-02-08 NOTE — Progress Notes (Signed)
ANTICOAGULATION CONSULT NOTE - Initial Consult  Pharmacy Consult for Warfarin Indication: VTE prophylaxis s/p L-TKA  Allergies  Allergen Reactions  . Codeine Nausea And Vomiting  . Oxycodone Nausea And Vomiting    Patient Measurements: Height: 5\' 10"  (177.8 cm) Weight: 208 lb 15.9 oz (94.8 kg) IBW/kg (Calculated) : 73   Vital Signs: Temp: 97.3 F (36.3 C) (02/25 1036) Temp src: Oral (02/25 0527) BP: 150/75 mmHg (02/25 1036) Pulse Rate: 50  (02/25 1036)  Labs: No results found for this basename: HGB:2,HCT:3,PLT:3,APTT:3,LABPROT:3,INR:3,HEPARINUNFRC:3,CREATININE:3,CKTOTAL:3,CKMB:3,TROPONINI:3 in the last 72 hours Estimated Creatinine Clearance: 47.9 ml/min (by C-G formula based on Cr of 1.28).  Medical History: Past Medical History  Diagnosis Date  . Arthritis     both knees -left  pain is worse  . Shingles     "long time ago"  . Diabetes mellitus     oral meds - no insulin  . Hypercholesterolemia   . BPH (benign prostatic hypertrophy)   . Hard of hearing     wears bilateral hearing aids    Medications:  Scheduled:    . acetaminophen  1,000 mg Intravenous Once  . acetaminophen  1,000 mg Intravenous Q6H  .  ceFAZolin (ANCEF) IV  1 g Intravenous Q6H  .  ceFAZolin (ANCEF) IV  2 g Intravenous Once  . dexamethasone  10 mg Intravenous Once  . docusate sodium  100 mg Oral BID  . glipiZIDE  10 mg Oral BID AC  . insulin aspart  0-15 Units Subcutaneous TID WC  . metFORMIN  1,000 mg Oral BID WC  . pioglitazone  15 mg Oral QHS  . simvastatin  20 mg Oral QHS  . terazosin  5 mg Oral QHS  . DISCONTD: chlorhexidine  60 mL Topical Once    Assessment:  88 YOM  warfarin dosing  s/p L-TKA.  No anticoagulation therapy PTA  Baseline INR (2/14) - 1.04  Given patient age, expect increased sensitivity to anticoagulation therapy, will initiate warfarin therapy at 5mg   Goal of Therapy:   INR 2-3   Plan:   Warfarin 2.5mg  x1 tonight  Monitor INR and adjust dose as  necessary  Warfarin education materials and video  Marijo Conception, Pharmacy Student 02/08/2012,11:22 AM   Hessie Knows, PharmD, BCPS pager [phone] 02/08/2012 11:43 AM

## 2012-02-08 NOTE — Transfer of Care (Signed)
Immediate Anesthesia Transfer of Care Note  Patient: Daniel Cooke  Procedure(s) Performed: Procedure(s) (LRB): TOTAL KNEE ARTHROPLASTY (Left)  Patient Location: PACU  Anesthesia Type: Spinal  Level of Consciousness: sedated, patient cooperative and responds to stimulaton  Airway & Oxygen Therapy: Patient Spontanous Breathing and Patient connected to face mask oxgen  Post-op Assessment: Report given to PACU RN and Post -op Vital signs reviewed and stable  Post vital signs: Reviewed and stable  Complications: No apparent anesthesia complications

## 2012-02-08 NOTE — H&P (View-Only) (Signed)
Daniel Cooke  DOB: 06/03/1924 Married / Language: English / Race: White / Male  Date of Admission:  02/08/2012  Chief Complaint:  Left knee pain  History of Present Illness  The patient is a 78 year old male who comes in today for a preoperative History and Physical. The patient is scheduled for a left total knee arthroplasty to be performed by Dr. Frank V. Aluisio, MD at  Hospital on 01/19/2012. The patient is a 78 year old male who presents with knee complaints. The patient is seen today for a second opinion. The patient reports left knee (worse) and right knee symptoms including: pain, swelling, instability, stiffness and soreness without any known injury.The patient feels that the symptoms are worsening. Previous work-up for this problem has included knee x-rays. Past treatment for this problem has included intra-articular injection of corticosteroids (and visco ). Mr. [name] has been treated previously at [facility] by Dr. Kramer. He has had multiple cortisone injections in both knees and multiple Visco supplement series in both knees. His left knee is currently more symptomatic than the right. It hurts at most times with most activities. Occasionally he has pain at night. It is limiting what he can and cannot do. He would like to go on walks, but doesn't. He has significant longevity in his family. His father lived past 100. His goal is to do the same. He feels like the knees are hampering him from being more active. He would like to proceed with surgery. They have been treated conservatively in the past for the above stated problem and despite conservative measures, they continue to have progressive pain and severe functional limitations and dysfunction. They have failed non-operative management including home exercise, medications, and injections. It is felt that they would benefit from undergoing total joint replacement. Risks and benefits of the procedure have  been discussed with the patient and they elect to proceed with surgery. There are no active contraindications to surgery such as ongoing infection or rapidly progressive neurological disease.  Allergies  Codeine/Codeine Derivatives. Nausea. "crazy"  Medication History  GlipiZIDE (10MG Tablet, Oral) Active. MetFORMIN HCl (1000MG Tablet, Oral) Active. Pioglitazone HCl (15MG Tablet, Oral) Active. Terazosin HCl (5MG Capsule, Oral) Active. Simvastatin (20MG Tablet, Oral) Active. Lisinopril (20MG Tablet, Oral) Active. Aspirin Adult Low Strength (81MG Tablet DR, Oral) Active.  Past Surgical History  Gallbladder Surgery. open Rotator Cuff Repair. left Tonsillectomy Appendectomy Arthroscopy of Knee. right Cataract Surgery. left Vasectomy  Problem List/Past Medical  Osteoarthritis, Knee Shingles Measles Mumps Diabetes Mellitus, Type II Hypercholesterolemia  Family History  Diabetes Mellitus. mother Heart Disease. mother  Social History  Exercise. Exercises weekly; does running / walking Illicit drug use. no Living situation. live with spouse Current work status. working part time Drug/Alcohol Rehab (Currently). no Drug/Alcohol Rehab (Previously). no Tobacco use. never smoker Marital status. married Number of flights of stairs before winded. 2-3 Pain Contract. no Children. 3 Post-Surgical Plans. Plan to go home.  Review of Systems  General:Not Present- Chills, Fever, Night Sweats, Fatigue, Weight Gain, Weight Loss and Memory Loss. Skin:Not Present- Hives, Itching, Rash, Eczema and Lesions. HEENT:Not Present- Tinnitus, Headache, Double Vision, Visual Loss, Hearing Loss and Dentures. Respiratory:Not Present- Shortness of breath with exertion, Shortness of breath at rest, Allergies, Coughing up blood and Chronic Cough. Cardiovascular:Not Present- Chest Pain, Racing/skipping heartbeats, Difficulty Breathing Lying Down, Murmur, Swelling and  Palpitations. Gastrointestinal:Not Present- Bloody Stool, Heartburn, Abdominal Pain, Vomiting, Nausea, Constipation, Diarrhea, Difficulty Swallowing, Jaundice and Loss of appetitie.   Male Genitourinary:Not Present- Urinary frequency, Blood in Urine, Weak urinary stream, Discharge, Flank Pain, Incontinence, Painful Urination, Urgency, Urinary Retention and Urinating at Night. Musculoskeletal:Not Present- Muscle Weakness, Muscle Pain, Joint Swelling, Joint Pain, Back Pain, Morning Stiffness and Spasms. Neurological:Not Present- Tremor, Dizziness, Blackout spells, Paralysis, Difficulty with balance and Weakness. Psychiatric:Not Present- Insomnia.  Vitals  Weight: 209 lb Height: 70 in Body Surface Area: 2.16 m Body Mass Index: 29.99 kg/m Pulse: 78 (Regular) Resp.: 20 (Unlabored) BP: 136/54 (Sitting, Left Arm, Standard)  Physical Exam The physical exam findings are as follows: Patient is an 78 year old male with continued knee pain. Patient is accompanied today by his wife.  General Mental Status - Alert, cooperative and good historian. General Appearance- pleasant. Not in acute distress. Orientation- Oriented X3. Build & Nutrition- Well nourished and Well developed. Mental Status- cooperative.  Head and Neck Head- normocephalic, atraumatic . Neck Global Assessment- bruit auscultated on the right, bruit auscultated on the left and supple. Carotid Arteries- Left- bruit (scant trace). Right- bruit (faint).  Eye Pupil- Bilateral- Regular and Round. Motion- Bilateral- EOMI. wears glasses Ears  Bilateral hearing aids  Chest and Lung Exam Auscultation: Breath sounds:- clear at anterior chest wall and - clear at posterior chest wall. Adventitious sounds:- No Adventitious sounds.  Cardiovascular Auscultation:Rhythm- Regular rate and rhythm. Heart Sounds- S1 WNL and S2 WNL. Murmurs & Other Heart Sounds: Murmur 1:Location- Aortic Area and  Sternal Border - Left. Timing- Early systolic. Grade- II/VI. Character- Low pitched.  Abdomen Palpation/Percussion:Tenderness- Abdomen is non-tender to palpation. Rigidity (guarding)- Abdomen is soft. Auscultation:Auscultation of the abdomen reveals - Bowel sounds normal.  Male Genitourinary  Not done, not pertinent to present illness  Musculoskeletal Well developed male, alert and oriented in no apparent distress. Evaluation of his hips, normal range of motion with no discomfort. Both knees show no effusion. Left knee shows varus deformity. Range is about 7 to 110. There is marked crepitus on range of motion of the knee. He is somewhat tender medial greater than lateral. There is no instability noted. Pulse, sensation, and motor are intact. Right knee - no effusion. No deformity. Range is 5 to 125, moderate crepitus on range of motion, tender medial greater than lateral. No instability.  RADIOGRAPHS: AP of both knees and lateral show both knees have advanced arthritis, worse certainly on the left than the right. He has bone on bone medial and patellofemoral of both knees with varus deformities and with large osteophytes present.  Assessment & Plan Osteoarthritis, Knee Impression: Left greater than Right Knee  Patient is for a Left Total Knee Replacement and a Right Knee Cortisone Injection by Dr. Aluisio.  Plan is to go home after the hospital stay.  PCP - Dr. Donna Gates - Patient has been seen preoperatively by Dr. Gates and felt to be stable for surgery. Dr. Gates recommended that he not take his diabetic meds the day of surgery.  Drew Shimon [place], PA-C    

## 2012-02-08 NOTE — Progress Notes (Signed)
Pt vomited small amt of undigested food after lunch. Medicated w/ zofran, states he felt better after vomiting. No further nausea. Denies pain at present. Will monitor.

## 2012-02-08 NOTE — Progress Notes (Signed)
Pt has had bloody drainage from hemovac since arrival to floor (1045).  Drew, PA, aware and orders to clamp x 4 hrs. Pt VSS.

## 2012-02-08 NOTE — Anesthesia Postprocedure Evaluation (Signed)
Anesthesia Post Note  Patient: Daniel Cooke  Procedure(s) Performed: Procedure(s) (LRB): TOTAL KNEE ARTHROPLASTY (Left)  Anesthesia type: Spinal  Patient location: PACU  Post pain: Pain level controlled  Post assessment: Post-op Vital signs reviewed  Last Vitals:  Filed Vitals:   02/08/12 0840  BP:   Pulse:   Temp: 36.4 C  Resp: 20    Post vital signs: Reviewed  Level of consciousness: sedated  Complications: No apparent anesthesia complications

## 2012-02-08 NOTE — Progress Notes (Signed)
Pt arrived to room 1610 in bed on CPM. Pt A&Ox4, denies pain/nausea at present. Assessment as charted. IS taught and pt uses effectively. Oriented to callbell and environment.  POC discussed w/ pt and family at bedside. Sipping water at bedside.

## 2012-02-09 LAB — CBC
HCT: 26.7 % — ABNORMAL LOW (ref 39.0–52.0)
MCV: 90.5 fL (ref 78.0–100.0)
Platelets: 173 10*3/uL (ref 150–400)
RBC: 2.95 MIL/uL — ABNORMAL LOW (ref 4.22–5.81)
WBC: 9.2 10*3/uL (ref 4.0–10.5)

## 2012-02-09 LAB — BASIC METABOLIC PANEL
BUN: 44 mg/dL — ABNORMAL HIGH (ref 6–23)
CO2: 26 mEq/L (ref 19–32)
Calcium: 8.9 mg/dL (ref 8.4–10.5)
Chloride: 105 mEq/L (ref 96–112)
Creatinine, Ser: 1.45 mg/dL — ABNORMAL HIGH (ref 0.50–1.35)
Creatinine, Ser: 1.52 mg/dL — ABNORMAL HIGH (ref 0.50–1.35)
GFR calc non Af Amer: 40 mL/min — ABNORMAL LOW (ref >90)
Glucose, Bld: 172 mg/dL — ABNORMAL HIGH (ref 70–99)
Potassium: 5.2 mEq/L — ABNORMAL HIGH (ref 3.5–5.1)
Sodium: 139 mEq/L (ref 135–145)

## 2012-02-09 LAB — PROTIME-INR: Prothrombin Time: 14.8 seconds (ref 11.6–15.2)

## 2012-02-09 MED ORDER — MORPHINE SULFATE 2 MG/ML IJ SOLN
1.0000 mg | Freq: Once | INTRAMUSCULAR | Status: DC
  Filled 2012-02-09: qty 1

## 2012-02-09 MED ORDER — MORPHINE SULFATE 2 MG/ML IJ SOLN
1.0000 mg | INTRAMUSCULAR | Status: DC | PRN
  Administered 2012-02-09 (×3): 2 mg via INTRAVENOUS
  Filled 2012-02-09 (×2): qty 1

## 2012-02-09 MED ORDER — SODIUM CHLORIDE 0.9 % IV BOLUS (SEPSIS)
250.0000 mL | Freq: Once | INTRAVENOUS | Status: AC
  Administered 2012-02-09: 250 mL via INTRAVENOUS

## 2012-02-09 MED ORDER — MORPHINE SULFATE 2 MG/ML IJ SOLN
INTRAMUSCULAR | Status: AC
  Administered 2012-02-09: 1 mg via INTRAVENOUS
  Filled 2012-02-09: qty 1

## 2012-02-09 MED ORDER — FUROSEMIDE 10 MG/ML IJ SOLN
40.0000 mg | Freq: Once | INTRAMUSCULAR | Status: AC
  Administered 2012-02-09: 40 mg via INTRAVENOUS
  Filled 2012-02-09: qty 4

## 2012-02-09 MED ORDER — NON FORMULARY: 150.0000 | Status: DC

## 2012-02-09 MED ORDER — WARFARIN SODIUM 2.5 MG PO TABS
2.5000 mg | ORAL_TABLET | Freq: Once | ORAL | Status: AC
  Administered 2012-02-09: 2.5 mg via ORAL
  Filled 2012-02-09: qty 1

## 2012-02-09 MED ORDER — POLYSACCHARIDE IRON COMPLEX 150 MG PO CAPS
150.0000 mg | ORAL_CAPSULE | Freq: Every day | ORAL | Status: DC
  Administered 2012-02-09 – 2012-02-11 (×3): 150 mg via ORAL
  Filled 2012-02-09 (×3): qty 1

## 2012-02-09 NOTE — Progress Notes (Signed)
Subjective: 1 Day Post-Op Procedure(s) (LRB): TOTAL KNEE ARTHROPLASTY (Left) Patient reports pain as mild and moderate.   Patient seen in rounds with Dr. Lequita Halt. Patient has complaints of nausea and vomiting every time with pain meds either PO or IV forms. We will start therapy today. Plan is to go home after hospital stay.  Objective: Vital signs in last 24 hours: Temp:  [97.2 F (36.2 C)-98.4 F (36.9 C)] 97.6 F (36.4 C) (02/26 0441) Pulse Rate:  [47-67] 57  (02/26 0441) Resp:  [15-23] 16  (02/26 0441) BP: (121-150)/(44-75) 131/56 mmHg (02/26 0441) SpO2:  [96 %-100 %] 100 % (02/26 0441) Weight:  [94.8 kg (208 lb 15.9 oz)] 94.8 kg (208 lb 15.9 oz) (02/25 1036)  Intake/Output from previous day:  Intake/Output Summary (Last 24 hours) at 02/09/12 0755 Last data filed at 02/09/12 0602  Gross per 24 hour  Intake   4060 ml  Output   2437 ml  Net   1623 ml    Intake/Output this shift: UOP 250  Labs:  [facility] 02/09/12 0351  HGB 8.8*    Basename 02/09/12 0351  WBC 9.2  RBC 2.95*  HCT 26.7*  PLT 173    Basename 02/09/12 0351  NA 137  K 5.2*  CL 105  CO2 26  BUN 44*  CREATININE 1.45*  GLUCOSE 233*  CALCIUM 9.2    Basename 02/09/12 0351  LABPT --  INR 1.14    Exam - Neurovascular intact Sensation intact distally Dressing - clean, dry, no drainage Motor function intact - moving foot and toes well on exam.  Hemovac pulled without difficulty.  Past Medical History  Diagnosis Date  . Arthritis     both knees -left  pain is worse  . Shingles     "long time ago"  . Diabetes mellitus     oral meds - no insulin  . Hypercholesterolemia   . BPH (benign prostatic hypertrophy)   . Hard of hearing     wears bilateral hearing aids    Assessment/Plan: 1 Day Post-Op Procedure(s) (LRB): TOTAL KNEE ARTHROPLASTY (Left) Principal Problem:  *OA (osteoarthritis) of knee  Slight bump in BUN and Creat - Monitor, Give fluids due to nausea and vomiting.  Recheck  labs. Hold Glucophage Advance diet Up with therapy Continue foley due to strict I&O and urinary output monitoring  DVT Prophylaxis - Coumadin Protocol Weight-Bearing as tolerated to left leg Keep foley until tomorrow. No vaccines. D/C PCA Dilaudid, D/C IV push Dilaudid D/C O2 and Pulse OX and try on Room [address]  Patrica Duel 02/09/2012, 7:55 AM

## 2012-02-09 NOTE — Progress Notes (Addendum)
Physical Therapy Treatment Patient Details Name: Daniel Cooke MRN: [id number] DOB: June 25, 1924 Today's Date: 02/09/2012  PT Assessment/Plan  PT - Assessment/Plan Comments on Treatment Session: Tx focused on there ex for strengthening and ROM as well as gait training with RW.  Pt continues to make good progress in therapy, but needs cues for safety and technique.  PT Plan: Discharge plan remains appropriate PT Frequency: 7X/week Follow Up Recommendations: Home health PT;Supervision/Assistance - 24 hour Equipment Recommended: 3 in 1 bedside comode;Rolling walker with 5" wheels PT Goals  Acute Rehab PT Goals PT Goal: Sit to Supine/Side - Progress: Progressing toward goal PT Goal: Sit to Stand - Progress: Progressing toward goal PT Goal: Stand to Sit - Progress: Progressing toward goal PT Goal: Ambulate - Progress: Progressing toward goal PT Goal: Perform Home Exercise Program - Progress: Progressing toward goal  PT Treatment Precautions/Restrictions  Precautions Precautions: Knee;Fall Restrictions Weight Bearing Restrictions: No Mobility (including Balance) Bed Mobility Bed Mobility: Yes Sit to Supine: 4: Min assist;HOB flat Sit to Supine - Details (indicate cue type and reason): Cues for technique Transfers Transfers: Yes Sit to Stand: From toilet;With upper extremity assist;4: Min assist Sit to Stand Details (indicate cue type and reason): Min A for steadying, cues to keep RW with pt to turn to sink Stand to Sit: 5: Supervision;With upper extremity assist;To bed Stand to Sit Details: Cues for hand placement to control descent Ambulation/Gait Ambulation/Gait: Yes Ambulation/Gait Assistance: 4: Min assist Ambulation/Gait Assistance Details (indicate cue type and reason): Steadyign assist and cues for even step length, heel strike, and posture with fatigue Ambulation Distance (Feet): 160 Feet Assistive device: Rolling walker Gait Pattern: Step-through pattern;Decreased stride  length;Antalgic    Exercise  Total Joint Exercises Ankle Circles/Pumps: Supine;20 reps;Both;Strengthening;AROM Quad Sets: Supine;10 reps;Both;Strengthening;AROM Towel Squeeze: Supine;10 reps;Both;Strengthening;AROM Heel Slides: Supine;10 reps;Left;Strengthening;AAROM Hip ABduction/ADduction: Supine;10 reps;Left;Strengthening;AROM Straight Leg Raises: Supine;10 reps;Left;Strengthening;AROM Long Arc Quad: Seated;10 reps;Left;Strengthening;AROM Knee Flexion: Seated;10 reps;Left;AAROM End of Session PT - End of Session Equipment Utilized During Treatment: Gait belt Activity Tolerance: Patient tolerated treatment well Patient left: in bed;with call bell in reach;with family/visitor present General Behavior During Session: [facility] for tasks performed Cognition: [facility] for tasks performed  [facility], PT 02/09/2012, 3:39 PM

## 2012-02-09 NOTE — Evaluation (Addendum)
Physical Therapy Evaluation Patient Details Name: Daniel Cooke MRN: [id number] DOB: Aug 02, 1924 Today's Date: 02/09/2012  Problem List:  Patient Active Problem List  Diagnoses  . OA (osteoarthritis) of knee    Past Medical History:  Past Medical History  Diagnosis Date  . Arthritis     both knees -left  pain is worse  . Shingles     "long time ago"  . Diabetes mellitus     oral meds - no insulin  . Hypercholesterolemia   . BPH (benign prostatic hypertrophy)   . Hard of hearing     wears bilateral hearing aids   Past Surgical History:  Past Surgical History  Procedure Date  . Cholecystectomy   . Left rotator cuff repair   . Appendectomy   . Tonsillectomy   . Right knee arthroscopy   . Eye surgery     left traumatic cataract removed--injury to the eye--states his eyesight is ok in left eye  . Vascectomy     PT Assessment/Plan/Recommendation PT Assessment: Pt is 78 y.o. Male, young for his years, admitted for L TKA and presents with acute pain, decreased strength, and ROM, limiting gait and functional mobility.  Pt will benefit from skilled PT to address these deficits and reduce falls risk and caregiver burden at home. At time of eval, pt was able to ambulate 150' with RW. Pt will ideally be at S level for safe DC home. Pt educated on optimal positioning and alignment for TKA.  PT Recommendation/Assessment: Patient will need skilled PT in the acute care venue PT Problem List: Decreased strength;Decreased range of motion;Decreased activity tolerance;Decreased balance;Decreased mobility;Decreased knowledge of use of DME;Decreased knowledge of precautions;Decreased safety awareness;Pain Barriers to Discharge: None PT Therapy Diagnosis : Difficulty walking;Acute pain PT Plan PT Frequency: 7X/week PT Treatment/Interventions: DME instruction;Gait training;Stair training;Functional mobility training;Therapeutic activities;Therapeutic exercise;Balance training;Neuromuscular  re-education;Patient/family education PT Recommendation Follow Up Recommendations: Home health PT;Supervision/Assistance - 24 hour Equipment Recommended: Rolling walker with 5" wheels PT Goals  Acute Rehab PT Goals PT Goal Formulation: With patient Time For Goal Achievement: 7 days Pt will go Supine/Side to Sit: with modified independence;with HOB 0 degrees PT Goal: Supine/Side to Sit - Progress: Goal set today Pt will go Sit to Supine/Side: with modified independence;with HOB 0 degrees PT Goal: Sit to Supine/Side - Progress: Goal set today Pt will go Sit to Stand: with supervision;with upper extremity assist PT Goal: Sit to Stand - Progress: Goal set today Pt will go Stand to Sit: with supervision PT Goal: Stand to Sit - Progress: Goal set today Pt will Ambulate: >150 feet;with supervision;with rolling walker PT Goal: Ambulate - Progress: Goal set today Pt will Go Up / Down Stairs: 6-9 stairs;with supervision;with rail(s) (1 rail on R) PT Goal: Up/Down Stairs - Progress: Goal set today Pt will Perform Home Exercise Program: Independently PT Goal: Perform Home Exercise Program - Progress: Goal set today  PT Evaluation Precautions/Restrictions  Precautions Precautions: Knee;Fall Restrictions Weight Bearing Restrictions: No Prior Functioning  Home Living Lives With: Spouse Receives Help From: Family (2 Daughters live nearby but work days, flexible schedule) Type of Home: House Home Layout: One level Home Access: Stairs to enter Entrance Stairs-Rails: Right Secretary/administrator of Steps: 6 Bathroom Shower/Tub: Health visitor: Standard Bathroom Accessibility: Yes How Accessible: Accessible via walker Home Adaptive Equipment: None Additional Comments: Wife has macular degeneration, but can provide S assist at home Prior Function Level of Independence: Independent with basic ADLs;Independent with gait;Independent with transfers Able to Take  Stairs?:  Yes Driving: Yes Vocation: Part time employment Vocation Requirements: Owns Industrial/product designer Arousal/Alertness: Awake/alert Overall Cognitive Status: Appears within functional limits for tasks assessed Orientation Level: Oriented X4 Sensation/Coordination Sensation Light Touch: Appears Intact Coordination Gross Motor Movements are Fluid and Coordinated: Yes Extremity Assessment RLE Assessment RLE Assessment: Within Functional Limits LLE Assessment LLE Assessment: Exceptions to WFL LLE PROM (degrees) LLE Overall PROM Comments: Grossly -3 ext to 50 flexion LLE Strength LLE Overall Strength Comments: descent quad strength noted, all other joints WFL Mobility (including Balance) Bed Mobility Bed Mobility: Yes Supine to Sit: 5: Supervision Supine to Sit Details (indicate cue type and reason): Cues for sequencing, increased time required Transfers Transfers: Yes Sit to Stand: 4: Min assist;With upper extremity assist;From bed Sit to Stand Details (indicate cue type and reason): Cues for safe hand placment, steadying assist as pt with increase pain upon standing Stand to Sit: 4: Min assist;With upper extremity assist;To chair/3-in-1 Stand to Sit Details: Cues for hand placement and to extend LE for comfort Ambulation/Gait Ambulation/Gait: Yes Ambulation/Gait Assistance: 4: Min assist Ambulation/Gait Assistance Details (indicate cue type and reason): Cues for posture, gait pattern, and foot clearance due to fatigue Ambulation Distance (Feet): 150 Feet Assistive device: Rolling walker Gait Pattern: Step-to pattern;Decreased stance time - right;Decreased weight shift to left Stairs: No  Posture/Postural Control Posture/Postural Control: No significant limitations Exercise  Total Joint Exercises Ankle Circles/Pumps: Supine;20 reps;Both;Strengthening;AROM Quad Sets: Supine;10 reps;Both;Strengthening;AROM Heel Slides: Supine;10 reps;Left;AAROM Knee Flexion: Seated;10  reps;Left;AAROM End of Session PT - End of Session Equipment Utilized During Treatment: Gait belt Activity Tolerance: Patient tolerated treatment well Patient left: in chair;with call bell in reach Nurse Communication: Mobility status for transfers;Mobility status for ambulation General Behavior During Session: [facility] for tasks performed Cognition: [facility] for tasks performed  [facility], PT 02/09/2012, 11:02 AM

## 2012-02-09 NOTE — Progress Notes (Signed)
PT Note  PT orders received, chart reviewed and eval attempted, but will try back later this AM as pt about to receive bolus due to N/V with pain meds.   [facility] [place], [place]  02/09/2012, 8:41 AM

## 2012-02-09 NOTE — Progress Notes (Signed)
Pt c/o bladder fullness, states he feels like he has to void. Foley draining clear, yellow urine.  No kinks noted in tubing. Bladder scan shows no urine in bladder. Muscle relaxer given. Tubing manipulated, pt states some relief at present. Will monitor closely.

## 2012-02-09 NOTE — Progress Notes (Signed)
ANTICOAGULATION CONSULT NOTE - Follow Up Consult  Pharmacy Consult for Warfarin Indication: VTE prophylaxis s/p L TKA  Allergies  Allergen Reactions  . Codeine Nausea And Vomiting  . Oxycodone Nausea And Vomiting    Patient Measurements: Height: 5\' 10"  (177.8 cm) Weight: 208 lb 15.9 oz (94.8 kg) IBW/kg (Calculated) : 73    Vital Signs: Temp: 97.4 F (36.3 C) (02/26 1044) Temp src: Oral (02/26 1044) BP: 122/63 mmHg (02/26 1044) Pulse Rate: 66  (02/26 1044)  Labs:  [facility] 02/09/12 0351  HGB 8.8*  HCT 26.7*  PLT 173  APTT --  LABPROT 14.8  INR 1.14  HEPARINUNFRC --  CREATININE 1.45*  CKTOTAL --  CKMB --  TROPONINI --   Estimated Creatinine Clearance: 42.3 ml/min (by C-G formula based on Cr of 1.45).   Medications:  Scheduled:    . acetaminophen  1,000 mg Intravenous Q6H  .  ceFAZolin (ANCEF) IV  1 g Intravenous Q6H  . coumadin book   Does not apply Once  . docusate sodium  100 mg Oral BID  . glipiZIDE  10 mg Oral BID AC  . insulin aspart  0-15 Units Subcutaneous TID WC  . iron polysaccharides  150 mg Oral Daily  . pioglitazone  15 mg Oral QHS  . simvastatin  20 mg Oral QHS  . sodium chloride  250 mL Intravenous Once  . terazosin  5 mg Oral QHS  . warfarin  2.5 mg Oral ONCE-1800  . warfarin  2.5 mg Oral ONCE-1800  . warfarin   Does not apply Once  . DISCONTD: metFORMIN  1,000 mg Oral BID WC  . DISCONTD: NON FORMULARY 150 capsule  150 capsule Per post-pyloric tube 1 day or 1 dose  . DISCONTD: NON FORMULARY 150 capsule  150 capsule Oral 1 day or 1 dose  . DISCONTD: scopolamine  1 patch Transdermal Q72H    Assessment:  Baseline INR normal (1.04)  INR today 1.14 after 2.5mg  Coumadin  Goal of Therapy:  INR 2-3   Plan:  Repeat 2.5mg  Coumadin today. Daily protimes.   Otho Bellows PharmD Pager [phone] 02/09/2012,12:13 PM

## 2012-02-09 NOTE — Evaluation (Signed)
Occupational Therapy Evaluation Patient Details Name: Daniel Cooke MRN: [id number] DOB: 06-29-1924 Today's Date: 02/09/2012  Problem List:  Patient Active Problem List  Diagnoses  . OA (osteoarthritis) of knee    Past Medical History:  Past Medical History  Diagnosis Date  . Arthritis     both knees -left  pain is worse  . Shingles     "long time ago"  . Diabetes mellitus     oral meds - no insulin  . Hypercholesterolemia   . BPH (benign prostatic hypertrophy)   . Hard of hearing     wears bilateral hearing aids   Past Surgical History:  Past Surgical History  Procedure Date  . Cholecystectomy   . Left rotator cuff repair   . Appendectomy   . Tonsillectomy   . Right knee arthroscopy   . Eye surgery     left traumatic cataract removed--injury to the eye--states his eyesight is ok in left eye  . Vascectomy     OT Assessment/Plan/Recommendation OT Assessment Clinical Impression Statement: Pt is an 78 yo male doing exceptionally well POD#1 TKR. All education completed. Pt will have necessary level of A by family upon d/c. Will need 3:1 for home use. OT Recommendation/Assessment: Patient does not need any further OT services OT Recommendation Follow Up Recommendations: No OT follow up Equipment Recommended: 3 in 1 bedside comode;Rolling walker with 5" wheels OT Goals    OT Evaluation Precautions/Restrictions  Precautions Precautions: Knee;Fall Restrictions Weight Bearing Restrictions: No Prior Functioning Home Living Lives With: Spouse Receives Help From: Family Type of Home: House Home Layout: One level Home Access: Stairs to enter Entrance Stairs-Rails: Right Entrance Stairs-Number of Steps: 6 Bathroom Shower/Tub: Health visitor: Standard Bathroom Accessibility: Yes How Accessible: Accessible via walker Home Adaptive Equipment: None Additional Comments: Wife has macular degeneration, but can provide S assist at home Prior  Function Level of Independence: Independent with basic ADLs;Independent with transfers;Independent with gait;Independent with homemaking with ambulation Able to Take Stairs?: Yes Driving: Yes Vocation: Part time employment 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 ADL ADL Grooming: Simulated;Supervision/safety Where Assessed - Grooming: Standing at sink Upper Body Bathing: Simulated;Set up Where Assessed - Upper Body Bathing: Sitting, chair;Unsupported Lower Body Bathing: Simulated;Minimal assistance Where Assessed - Lower Body Bathing: Sit to stand from chair Upper Body Dressing: Simulated;Set up Where Assessed - Upper Body Dressing: Sit to stand from chair Lower Body Dressing: Simulated;Minimal assistance Where Assessed - Lower Body Dressing: Sit to stand from chair Toilet Transfer: Performed;Supervision/safety Toilet Transfer Method: Proofreader: Raised toilet seat with arms (or 3-in-1 over toilet) Toileting - Clothing Manipulation: Performed;Supervision/safety Where Assessed - Toileting Clothing Manipulation: Sit to stand from 3-in-1 or toilet Toileting - Hygiene: Simulated;Supervision/safety Where Assessed - Toileting Hygiene: Sit to stand from 3-in-1 or toilet Tub/Shower Transfer: Not assessed (did discuss with pt how to safely step into shower.) Tub/Shower Transfer Method: Not assessed Equipment Used: Rolling walker;Other (comment) (3:1) Vision/Perception    Cognition Cognition Arousal/Alertness: Awake/alert Overall Cognitive Status: Appears within functional limits for tasks assessed Orientation Level: Oriented X4 Sensation/Coordination Sensation Light Touch: Appears Intact Coordination Gross Motor Movements are Fluid and Coordinated: Yes Extremity Assessment RUE Assessment RUE Assessment: Within Functional Limits LUE Assessment LUE Assessment: Within Functional Limits Mobility  Bed Mobility Bed Mobility: Yes Supine to Sit: 5:  Supervision Supine to Sit Details (indicate cue type and reason): Cues for sequencing, increased time required Transfers Sit to Stand: From chair/3-in-1;From toilet;With upper extremity assist;With armrests (minguard A) Sit  to Stand Details (indicate cue type and reason): Cues for safe hand placment, steadying assist as pt with increase pain upon standing Stand to Sit: To chair/3-in-1;With upper extremity assist;With armrests (Minguard A) Stand to Sit Details: Cues for hand placement and to extend LE for comfort Exercises  End of Session OT - End of Session Equipment Utilized During Treatment: Gait belt Activity Tolerance: Patient tolerated treatment well Patient left: in chair;with call bell in reach General Behavior During Session: [facility] for tasks performed Cognition: [facility] for tasks performed   Oleda Borski A, OTR/L [phone] 02/09/2012, 12:55 PM

## 2012-02-10 LAB — PROTIME-INR: Prothrombin Time: 14.4 seconds (ref 11.6–15.2)

## 2012-02-10 LAB — CBC
HCT: 26.6 % — ABNORMAL LOW (ref 39.0–52.0)
MCH: 30 pg (ref 26.0–34.0)
MCHC: 32.7 g/dL (ref 30.0–36.0)
MCV: 91.7 fL (ref 78.0–100.0)
Platelets: 170 10*3/uL (ref 150–400)
RDW: 14.1 % (ref 11.5–15.5)
WBC: 8.2 10*3/uL (ref 4.0–10.5)

## 2012-02-10 LAB — BASIC METABOLIC PANEL
BUN: 41 mg/dL — ABNORMAL HIGH (ref 6–23)
Calcium: 9.1 mg/dL (ref 8.4–10.5)
Creatinine, Ser: 1.49 mg/dL — ABNORMAL HIGH (ref 0.50–1.35)
GFR calc Af Amer: 47 mL/min — ABNORMAL LOW (ref >90)
GFR calc non Af Amer: 41 mL/min — ABNORMAL LOW (ref >90)

## 2012-02-10 LAB — GLUCOSE, CAPILLARY
Glucose-Capillary: 155 mg/dL — ABNORMAL HIGH (ref 70–99)
Glucose-Capillary: 176 mg/dL — ABNORMAL HIGH (ref 70–99)
Glucose-Capillary: 151 mg/dL — ABNORMAL HIGH (ref 70–99)

## 2012-02-10 MED ORDER — METHOCARBAMOL 500 MG PO TABS
500.0000 mg | ORAL_TABLET | Freq: Four times a day (QID) | ORAL | Status: AC | PRN
Start: 2012-02-10 — End: 2012-02-20

## 2012-02-10 MED ORDER — WARFARIN SODIUM 5 MG PO TABS
5.0000 mg | ORAL_TABLET | Freq: Once | ORAL | Status: AC
  Administered 2012-02-10: 5 mg via ORAL
  Filled 2012-02-10: qty 1

## 2012-02-10 MED ORDER — WARFARIN - PHARMACIST DOSING INPATIENT
Freq: Every day | Status: DC
  Filled 2012-02-10 (×2): qty 1

## 2012-02-10 MED ORDER — POLYSACCHARIDE IRON COMPLEX 150 MG PO CAPS: 150.0000 mg | ORAL_CAPSULE | Freq: Every day | ORAL | Status: DC

## 2012-02-10 MED ORDER — TRAMADOL HCL 50 MG PO TABS: 50.0000 mg | ORAL_TABLET | Freq: Four times a day (QID) | ORAL | Status: AC | PRN

## 2012-02-10 NOTE — Progress Notes (Signed)
Coumadin education completed at 1400 on 2/27 Daughter and wife at beside. Daniel Cooke wife is on Coumadin currently - expressed understanding.    Geoffry Paradise, PharmD.   Pager:  [phone] 2:38 PM

## 2012-02-10 NOTE — Progress Notes (Signed)
Physical Therapy Treatment Patient Details Name: Daniel Cooke MRN: [id number] DOB: 27-Oct-1924 Today's Date: 02/10/2012  PT Assessment/Plan   PT Goals  Acute Rehab PT Goals PT Goal: Sit to Stand - Progress: Progressing toward goal PT Goal: Stand to Sit - Progress: Progressing toward goal PT Goal: Ambulate - Progress: Progressing toward goal PT Goal: Perform Home Exercise Program - Progress: Progressing toward goal  PT Treatment Precautions/Restrictions  Precautions Precautions: Knee;Fall Required Braces or Orthoses: Yes Knee Immobilizer: Discontinue once straight leg raise with < 10 degree lag Restrictions Weight Bearing Restrictions: No Mobility (including Balance) Bed Mobility Bed Mobility: No Transfers Transfers: Yes Sit to Stand: 4: Min assist Sit to Stand Details (indicate cue type and reason): assist to managed left leg while scooting Stand to Sit: 5: Supervision Ambulation/Gait Ambulation/Gait: Yes Ambulation/Gait Assistance: 4: Min assist Ambulation Distance (Feet): 72 Feet Assistive device: Rolling walker Gait Pattern: Step-through pattern;Decreased stride length;Antalgic    Exercise  Total Joint Exercises Ankle Circles/Pumps: 20 reps;Strengthening;AROM;Left;Seated Quad Sets: 10 reps;Both;Strengthening;AROM;Seated Heel Slides: AAROM;Left;Strengthening;10 reps;Seated Hip ABduction/ADduction: AAROM;Strengthening;Left;10 reps;Seated Straight Leg Raises: AAROM;Left;10 reps;Seated End of Session PT - End of Session Equipment Utilized During Treatment: Gait belt Activity Tolerance: Patient tolerated treatment well Patient left: in chair;with call bell in reach  Anza, PTA 02/10/2012, 12:02 PM

## 2012-02-10 NOTE — Progress Notes (Signed)
Physical Therapy Treatment Patient Details Name: Daniel Cooke MRN: [id number] DOB: June 15, 1924 Today's Date: 02/10/2012  PT Assessment/Plan  PT - Assessment/Plan Comments on Treatment Session: Pt progressing well. Pt did not want to perform therex this afternoon but agreed to ambulation. Pt still requires cueing for safety with RW PT Plan: Discharge plan remains appropriate PT Frequency: 7X/week Follow Up Recommendations: Home health PT;Supervision/Assistance - 24 hour Equipment Recommended: 3 in 1 bedside comode;Rolling walker with 5" wheels PT Goals  Acute Rehab PT Goals PT Goal Formulation: With patient PT Goal: Supine/Side to Sit - Progress: Progressing toward goal PT Goal: Sit to Supine/Side - Progress: Progressing toward goal PT Goal: Sit to Stand - Progress: Progressing toward goal PT Goal: Stand to Sit - Progress: Met PT Goal: Ambulate - Progress: Progressing toward goal  PT Treatment Precautions/Restrictions  Precautions Precautions: Knee;Fall Required Braces or Orthoses: Yes Knee Immobilizer: Discontinue once straight leg raise with < 10 degree lag Restrictions Weight Bearing Restrictions: No Mobility (including Balance) Bed Mobility Bed Mobility: Yes Supine to Sit: 4: Min assist;With rails;HOB elevated (Comment degrees) (30) Supine to Sit Details (indicate cue type and reason): Min assist with LLE into sitting. VC for sequencing Sitting - Scoot to Edge of Bed: 5: Supervision Sitting - Scoot to [place] of Bed Details (indicate cue type and reason): VC for weight shifting to edge of bed Sit to Supine: 4: Min assist;With rail;HOB elevated (comment degrees) (30) Sit to Supine - Details (indicate cue type and reason): Min assist with LLE. VC for sequencing and technique Transfers Transfers: Yes Sit to Stand: 4: Min assist;With upper extremity assist;From bed Sit to Stand Details (indicate cue type and reason): VC for hand placement. Assist for stabiltiy into standing and  for anterior translation Stand to Sit: 5: Supervision;With upper extremity assist;To bed Stand to Sit Details: VC for hand placement for safety from RW Ambulation/Gait Ambulation/Gait: Yes Ambulation/Gait Assistance: 4: Min assist (Minguard assist) Ambulation/Gait Assistance Details (indicate cue type and reason): VC throughout for safety with distance to RW as well as for proper gait sequencing  Ambulation Distance (Feet): 90 Feet Assistive device: Rolling walker Gait Pattern: Step-through pattern;Decreased stride length;Antalgic Gait velocity: Decreased gait speed Stairs: No    Exercise    End of Session PT - End of Session Equipment Utilized During Treatment: Gait belt Activity Tolerance: Patient tolerated treatment well Patient left: in bed;with call bell in reach Nurse Communication: Mobility status for transfers;Mobility status for ambulation General Behavior During Session: [facility] for tasks performed Cognition: [facility] for tasks performed  Milana Kidney 02/10/2012, 4:14 PM  02/10/2012 Milana Kidney DPT PAGER: [phone] OFFICE: [phone]

## 2012-02-10 NOTE — Progress Notes (Signed)
ANTICOAGULATION CONSULT NOTE - Follow Up Consult  Pharmacy Consult for Warfarin Indication: VTE prophylaxis s/p L TKA  Allergies  Allergen Reactions  . Codeine Nausea And Vomiting  . Oxycodone Nausea And Vomiting    Patient Measurements: Height: 5\' 10"  (177.8 cm) Weight: 208 lb 15.9 oz (94.8 kg) IBW/kg (Calculated) : 73    Vital Signs: Temp: 101.6 F (38.7 C) (02/27 0655) Temp src: Oral (02/27 0655) BP: 119/78 mmHg (02/27 0655) Pulse Rate: 97  (02/27 0655)  Labs:  Basename 02/10/12 0425 02/09/12 1545 02/09/12 0351  HGB 8.7* -- 8.8*  HCT 26.6* -- 26.7*  PLT 170 -- 173  APTT -- -- --  LABPROT 14.4 -- 14.8  INR 1.10 -- 1.14  HEPARINUNFRC -- -- --  CREATININE 1.49* 1.52* 1.45*  CKTOTAL -- -- --  CKMB -- -- --  TROPONINI -- -- --   Estimated Creatinine Clearance: 41.1 ml/min (by C-G formula based on Cr of 1.49).  Assessment:  78 YOM s/p L TKA, Coumadin started 2/25 for VTE prophylaxis  Coumadin started at low dose of 2.5 mg and repeated x 2 days given age of 78 YO.   Coumadin score = 5   INR remains at 1.10, Scr stable at 1.49  No bleeding/complications noted.  Hgb 8.7  Goal of Therapy:  INR 2-3   Plan:   Will increase Coumadin to 5 mg po x 1 tonight  Daily PT/INR  Will provide education today   Geoffry Paradise, PharmD.   Pager:  [phone] 11:01 AM

## 2012-02-10 NOTE — Discharge Instructions (Signed)
Knee Rehabilitation, Guidelines Following Surgery Results after knee surgery are often greatly improved when you follow the exercise, range of motion and muscle strengthening exercises prescribed by your doctor. Safety measures are also important to protect the knee from further injury. Any time any of these exercises cause you to have increased pain or swelling in your knee joint, decrease the amount until you are comfortable again and slowly increase them. If you have problems or questions, call your caregiver or physical therapist for advice. HOME CARE INSTRUCTIONS   Remove items at home which could result in a fall. This includes throw rugs or furniture in walking pathways.   Continue medications as instructed.   You may shower or take tub baths when your staples or stitches are removed or as instructed.   Walk using crutches or walker as instructed.   Put weight on your legs and walk as much as is comfortable.   You may resume a sexual relationship in one month or when given the OK by your doctor.   Return to work as instructed by your doctor.   Do not drive a car for 6 weeks or as instructed.   Wear elastic stockings until instructed not to.   Make sure you keep all of your appointments after your operation with all of your doctors and caregivers.  RANGE OF MOTION AND STRENGTHENING EXERCISES Rehabilitation of the knee is important following a knee injury or an operation. After just a few days of immobilization, the muscles of the thigh which control the knee become weakened and shrink (atrophy). Knee exercises are designed to build up the tone and strength of the thigh muscles and to improve knee motion. Often times heat used for twenty to thirty minutes before working out will loosen up your tissues and help with improving the range of motion. These exercises can be done on a training (exercise) mat, on the floor, on a table or on a bed. Use what ever works the best and is most  comfortable for you Knee exercises include:  Leg Lifts - While your knee is still immobilized in a splint or cast, you can do straight leg raises. Lift the leg to 60 degrees, hold for 3 sec, and slowly lower the leg. Repeat 10-20 times 2-3 times daily. Perform this exercise against resistance later as your knee gets better.   Quad and Hamstring Sets - Tighten up the muscle on the front of the thigh (Quad) and hold for 5-10 sec. Repeat this 10-20 times hourly. Hamstring sets are done by pushing the foot backward against an object and holding for 5-10 sec. Repeat as with quad sets.   A rehabilitation program following serious knee injuries can speed recovery and prevent re-injury in the future due to weakened muscles. Contact your doctor or a physical therapist for more information on knee rehabilitation. MAKE SURE YOU:   Understand these instructions.   Will watch your condition.   Will get help right away if you are not doing well or get worse.  Document Released: 11/30/2005 Document Revised: 08/12/2011 Document Reviewed: 05/20/2007 [employer] Patient Information 2012 [employer].  Pick up stool softner and laxative for home. Do not submerge incision under water. May shower. Continue to use ice for pain and swelling from surgery.  

## 2012-02-10 NOTE — Progress Notes (Signed)
Subjective: 2 Days Post-Op Procedure(s) (LRB): TOTAL KNEE ARTHROPLASTY (Left) Patient reports pain as mild.   Patient has complaints of pain yesterday with nausea and vomiting.  N/V has resolved and tolerating Ultram for now.  He had urinary retention yesterday requiring Lasix dose. He had a slight bump in his renal function also.  That is stable today.  We were watching his I&O's very closely and had ordered for the foley to stay in until today.  The foley was removed this morning due to hospital regulations for the 48 hour requirement despite having an order to remain in and not remove.  Fortunately, his renal function stabilized and his urinary output increased. Hi is voiding on his own now and probably has avoided any acute renal failure.  Overall, he is doing better now. Continue therapy and the home when met goals.  Objective: Vital signs in last 24 hours: Temp:  [97.4 F (36.3 C)-101.6 F (38.7 C)] 101.6 F (38.7 C) (02/27 0655) Pulse Rate:  [62-97] 97  (02/27 0655) Resp:  [16-18] 16  (02/27 0655) BP: (119-139)/(61-78) 119/78 mmHg (02/27 0655) SpO2:  [90 %-98 %] 98 % (02/27 0655)  Intake/Output from previous day:  Intake/Output Summary (Last 24 hours) at 02/10/12 1212 Last data filed at 02/10/12 0800  Gross per 24 hour  Intake 1387.5 ml  Output   3751 ml  Net -2363.5 ml    Intake/Output this shift: Total I/O In: 240 [P.O.:240] Out: 200 [Urine:200]  Labs:  [facility] 02/10/12 0425 02/09/12 0351  HGB 8.7* 8.8*    Basename 02/10/12 0425 02/09/12 0351  WBC 8.2 9.2  RBC 2.90* 2.95*  HCT 26.6* 26.7*  PLT 170 173    Basename 02/10/12 0425 02/09/12 1545  NA 143 139  K 4.6 4.5  CL 108 106  CO2 28 25  BUN 41* 47*  CREATININE 1.49* 1.52*  GLUCOSE 167* 172*  CALCIUM 9.1 8.9    Basename 02/10/12 0425 02/09/12 0351  LABPT -- --  INR 1.10 1.14    Exam - Neurovascular intact Sensation intact distally Dressing/Incision - clean, dry, no drainage Motor function intact  - moving foot and toes well on exam.   Past Medical History  Diagnosis Date  . Arthritis     both knees -left  pain is worse  . Shingles     "long time ago"  . Diabetes mellitus     oral meds - no insulin  . Hypercholesterolemia   . BPH (benign prostatic hypertrophy)   . Hard of hearing     wears bilateral hearing aids    Assessment/Plan: 2 Days Post-Op Procedure(s) (LRB): TOTAL KNEE ARTHROPLASTY (Left) Principal Problem:  *OA (osteoarthritis) of knee   Advance diet Up with therapy D/C IV fluids Discharge home with home health  DVT Prophylaxis - Coumadin Protocol Weight-Bearing as tolerated to left leg  Malyiah Fellows 02/10/2012, 12:12 PM

## 2012-02-11 LAB — CBC
MCV: 91.9 fL (ref 78.0–100.0)
Platelets: 157 10*3/uL (ref 150–400)
RBC: 2.84 MIL/uL — ABNORMAL LOW (ref 4.22–5.81)
WBC: 7.7 10*3/uL (ref 4.0–10.5)

## 2012-02-11 LAB — PROTIME-INR
INR: 1.3 (ref 0.00–1.49)
Prothrombin Time: 16.4 seconds — ABNORMAL HIGH (ref 11.6–15.2)

## 2012-02-11 LAB — GLUCOSE, CAPILLARY: Glucose-Capillary: 133 mg/dL — ABNORMAL HIGH (ref 70–99)

## 2012-02-11 MED ORDER — WARFARIN SODIUM 5 MG PO TABS
5.0000 mg | ORAL_TABLET | Freq: Every day | ORAL | Status: DC
  Filled 2012-02-11: qty 1

## 2012-02-11 MED ORDER — ENOXAPARIN SODIUM 40 MG/0.4ML ~~LOC~~ SOLN
40.0000 mg | SUBCUTANEOUS | Status: DC
  Administered 2012-02-11: 40 mg via SUBCUTANEOUS
  Filled 2012-02-11: qty 0.4

## 2012-02-11 MED ORDER — ENOXAPARIN SODIUM 40 MG/0.4ML ~~LOC~~ SOLN: 40.0000 mg | SUBCUTANEOUS | Status: DC

## 2012-02-11 MED ORDER — WARFARIN SODIUM 5 MG PO TABS: 5.0000 mg | ORAL_TABLET | Freq: Every day | ORAL | Status: DC

## 2012-02-11 NOTE — Progress Notes (Signed)
Physical Therapy Treatment Patient Details Name: Daniel Cooke MRN: [id number] DOB: Feb 02, 1924 Today's Date: 02/11/2012  L TKR POD #3 pm session 13:05 - 13:30 1 gt  1 ta  PT Assessment/Plan  PT - Assessment/Plan Comments on Treatment Session: Pt has met goals to D/C to home with family support.  Daughter present and educated on [place] use, when to D/C and proper application.  Daughter observed/instructed on proper tech to acend steps using one rail and one crutch.  Daughter educated on TKR HEP and use of ICE.   PT Plan: Discharge plan remains appropriate Follow Up Recommendations: Home health PT Equipment Recommended: Other (comment) (equipment delivered and in room) PT Goals  Acute Rehab PT Goals PT Goal Formulation: With patient Pt will go Supine/Side to Sit: with modified independence PT Goal: Supine/Side to Sit - Progress: Partly met Pt will go Sit to Supine/Side: with modified independence PT Goal: Sit to Supine/Side - Progress: Partly met Pt will go Sit to Stand: with supervision PT Goal: Sit to Stand - Progress: Met Pt will go Stand to Sit: with supervision PT Goal: Stand to Sit - Progress: Met Pt will Ambulate: >150 feet;with supervision;with rolling walker PT Goal: Ambulate - Progress: Partly met Pt will Go Up / Down Stairs: 6-9 stairs;with supervision;with least restrictive assistive device PT Goal: Up/Down Stairs - Progress: Partly met Pt will Perform Home Exercise Program: with supervision, verbal cues required/provided PT Goal: Perform Home Exercise Program - Progress: Partly met  PT Treatment Precautions/Restrictions  Precautions Precautions: Knee Precaution Comments: Pt instructed on KI use for amb and proper application Required Braces or Orthoses: Yes Knee Immobilizer: Discontinue once straight leg raise with < 10 degree lag Restrictions Weight Bearing Restrictions: No Other Position/Activity Restrictions: WBAT Mobility (including Balance) Bed Mobility Bed  Mobility: No (Pt OOB in recliner) Supine to Sit: 5: Supervision Supine to Sit Details (indicate cue type and reason): HOB increased 45', increased time and Min Assist to support L LE off bed Sitting - Scoot to Edge of Bed: 5: Supervision Sitting - Scoot to [place] of Bed Details (indicate cue type and reason): Min Assist to support L LE while pt scooted to EOB Transfers Transfers: Yes Sit to Stand: 5: Supervision Sit to Stand Details (indicate cue type and reason): 25% VC's on proper tech and hand placement to push up from bed vs pull up from walker Stand to Sit: 5: Supervision Stand to Sit Details: 25% VC's to extend L LE prior to sit and control decend with hand placement on chair Ambulation/Gait Ambulation/Gait: Yes Ambulation/Gait Assistance: 5: Supervision Ambulation/Gait Assistance Details (indicate cue type and reason): to and from steps approx 15" Ambulation Distance (Feet): 15 Feet Assistive device: Rolling walker Gait Pattern: Step-to pattern;Trunk flexed;Decreased stance time - left Gait velocity: Pt c/o 8/10 L Knee pain with act, not yet time for meds.  Applied ICE pack and rest. Stairs: Yes Stairs Assistance: 4: Min assist Stairs Assistance Details (indicate cue type and reason): with dghtr, one rail and one crutch.  Pt issued crutch to take home.  Advised dghtr she may need extra help as pt has 6 steps and fatigues quickly. Stair Management Technique: One rail Right;With crutches Number of Stairs: 4  Wheelchair Mobility Wheelchair Mobility: No    End of Session PT - End of Session Equipment Utilized During Treatment: Gait belt;Left knee immobilizer Activity Tolerance: Patient tolerated treatment well Patient left: in chair;with call bell in reach;with family/visitor present Nurse Communication: Other (comment) (Pt ready for D/C to  home) General Behavior During Session: [facility] for tasks performed Cognition: [facility] for tasks performed  Felecia Shelling  PTA [facility]  Acute   Rehab Pager     [phone]

## 2012-02-11 NOTE — Progress Notes (Signed)
Physical Therapy Treatment Patient Details Name: Daniel Cooke MRN: [id number] DOB: 09/29/1924 Today's Date: 02/11/2012  L TKR POD #3 am session 11:55 - 12:25 1 gt  1 te  PT Assessment/Plan  PT - Assessment/Plan Comments on Treatment Session: Pt plans to D/C to home today.  Instructed pt on KI use for amb and when to D/C.  Asssited pt OOB to amb in hallway, then to bathroom for a wash up then to recliner to perform TKR TE's.  Will return to practice steps when daughter arrives. PT Plan: Discharge plan remains appropriate Follow Up Recommendations: Home health PT Equipment Recommended: Other (comment) (equipment delivered and in room) PT Goals  Acute Rehab PT Goals PT Goal Formulation: With patient Pt will go Supine/Side to Sit: with modified independence PT Goal: Supine/Side to Sit - Progress: Progressing toward goal Pt will go Sit to Supine/Side: with modified independence PT Goal: Sit to Supine/Side - Progress: Progressing toward goal Pt will go Sit to Stand: with supervision PT Goal: Sit to Stand - Progress: Progressing toward goal Pt will go Stand to Sit: with supervision PT Goal: Stand to Sit - Progress: Progressing toward goal Pt will Ambulate: >150 feet;with rolling walker;with supervision PT Goal: Ambulate - Progress: Progressing toward goal Pt will Go Up / Down Stairs: 6-9 stairs;with supervision;with least restrictive assistive device PT Goal: Up/Down Stairs - Progress: Not met Pt will Perform Home Exercise Program: with supervision, verbal cues required/provided PT Goal: Perform Home Exercise Program - Progress: Progressing toward goal  PT Treatment Precautions/Restrictions  Precautions Precautions: Knee Precaution Comments: Pt instructed on KI use for amb and proper application Required Braces or Orthoses: Yes Knee Immobilizer: Discontinue once straight leg raise with < 10 degree lag Restrictions Weight Bearing Restrictions: No Other Position/Activity  Restrictions: WBAT Mobility (including Balance) Bed Mobility Bed Mobility: Yes Supine to Sit: 5: Supervision Supine to Sit Details (indicate cue type and reason): HOB increased 45', increased time and Min Assist to support L LE off bed Sitting - Scoot to Edge of Bed: 5: Supervision Sitting - Scoot to [employer] of Bed Details (indicate cue type and reason): Min Assist to support L LE while pt scooted to EOB Transfers Transfers: Yes Sit to Stand: Other (comment);From bed;From elevated surface (MinGuard Assist) Sit to Stand Details (indicate cue type and reason): 25% VC's on proper tech and hand placement to push up from bed vs pull up from walker Stand to Sit: To chair/3-in-1;5: Supervision Stand to Sit Details: 25% VC's to extend L LE prior to sit and control decend with hand placement on chair Ambulation/Gait Ambulation/Gait: Yes Ambulation/Gait Assistance: Other (comment) (MinGuard Assist) Ambulation/Gait Assistance Details (indicate cue type and reason): with KI and increased tiem.  50% VC's to increased WB thru L LE vs thru RW. Ambulation Distance (Feet): 110 Feet Assistive device: Rolling walker Gait Pattern: Step-to pattern;Decreased stance time - left;Trunk flexed Gait velocity: Pt c/o 8/10 L Knee pain with act, not yet time for meds.  Applied ICE pack and rest. Stairs: No (Will perform steps when daughter arrives for education) Wheelchair Mobility Wheelchair Mobility: No    Exercise  Total Joint Exercises Ankle Circles/Pumps: AROM;Both;10 reps;Seated Quad Sets: AROM;Both;10 reps;Seated Gluteal Sets: AROM;Both;10 reps;Seated Towel Squeeze: AROM;Both;10 reps;Seated Short Arc Quad: AAROM;Left;5 reps;Supine Heel Slides: AAROM;Left;5 reps;Supine Hip ABduction/ADduction: AAROM;Left;10 reps;Supine Straight Leg Raises: AAROM;Left;10 reps;Supine End of Session PT - End of Session Equipment Utilized During Treatment: Gait belt;Left knee immobilizer Activity Tolerance: Patient tolerated  treatment well Patient left: in chair;with call  bell in reach Nurse Communication: Other (comment) (will return to practice steps when dghtr arrives) General Behavior During Session: [facility] for tasks performed Cognition: [facility] for tasks performed  Felecia Shelling  PTA [facility]  Acute  Rehab Pager     [phone]

## 2012-02-11 NOTE — Progress Notes (Signed)
Subjective: 3 Days Post-Op Procedure(s) (LRB): TOTAL KNEE ARTHROPLASTY (Left) Patient reports pain as mild.  Patient has complaints of pain with therapy but doing OK.  Ready to go home.  Will get one more session before discharge.  His renal function stabilized.  He states that he is voiding well.  Plan home today after therapy.  Objective: Vital signs in last 24 hours: Temp:  [97.5 F (36.4 C)-99 F (37.2 C)] 98.3 F (36.8 C) (02/28 0607) Pulse Rate:  [84-95] 84  (02/28 0607) Resp:  [16] 16  (02/28 0607) BP: (139-148)/(61-75) 139/61 mmHg (02/28 0607) SpO2:  [92 %-95 %] 92 % (02/28 0607)  Intake/Output from previous day:  Intake/Output Summary (Last 24 hours) at 02/11/12 1011 Last data filed at 02/11/12 1000  Gross per 24 hour  Intake   2105 ml  Output   1850 ml  Net    255 ml    Intake/Output this shift: Total I/O In: 100 [P.O.:100] Out: 175 [Urine:175]  Labs: Results for orders placed during the hospital encounter of 02/08/12  TYPE AND SCREEN      Component Value Range   ABO/RH(D) A POS     Antibody Screen NEG     Sample Expiration 02/11/2012    GLUCOSE, CAPILLARY      Component Value Range   Glucose-Capillary 73  70 - 99 (mg/dL)  ABO/RH      Component Value Range   ABO/RH(D) A POS    GLUCOSE, CAPILLARY      Component Value Range   Glucose-Capillary 78  70 - 99 (mg/dL)   Comment 1 Documented in Chart    GLUCOSE, CAPILLARY      Component Value Range   Glucose-Capillary 146 (*) 70 - 99 (mg/dL)   Comment 1 Notify RN     Comment 2 Documented in Chart    CBC      Component Value Range   WBC 9.2  4.0 - 10.5 (K/uL)   RBC 2.95 (*) 4.22 - 5.81 (MIL/uL)   Hemoglobin 8.8 (*) 13.0 - 17.0 (g/dL)   HCT 16.1 (*) 09.6 - 52.0 (%)   MCV 90.5  78.0 - 100.0 (fL)   MCH 29.8  26.0 - 34.0 (pg)   MCHC 33.0  30.0 - 36.0 (g/dL)   RDW 04.5  40.9 - 81.1 (%)   Platelets 173  150 - 400 (K/uL)  BASIC METABOLIC PANEL      Component Value Range   Sodium 137  135 - 145 (mEq/L)   Potassium 5.2 (*) 3.5 - 5.1 (mEq/L)   Chloride 105  96 - 112 (mEq/L)   CO2 26  19 - 32 (mEq/L)   Glucose, Bld 233 (*) 70 - 99 (mg/dL)   BUN 44 (*) 6 - 23 (mg/dL)   Creatinine, Ser 9.14 (*) 0.50 - 1.35 (mg/dL)   Calcium 9.2  8.4 - 78.2 (mg/dL)   GFR calc non Af Amer 42 (*) >90 (mL/min)   GFR calc Af Amer 49 (*) >90 (mL/min)  PROTIME-INR      Component Value Range   Prothrombin Time 14.8  11.6 - 15.2 (seconds)   INR 1.14  0.00 - 1.49   GLUCOSE, CAPILLARY      Component Value Range   Glucose-Capillary 233 (*) 70 - 99 (mg/dL)   Comment 1 Notify RN     Comment 2 Documented in Chart    GLUCOSE, CAPILLARY      Component Value Range   Glucose-Capillary 261 (*) 70 - 99 (  mg/dL)  GLUCOSE, CAPILLARY      Component Value Range   Glucose-Capillary 159 (*) 70 - 99 (mg/dL)  BASIC METABOLIC PANEL      Component Value Range   Sodium 139  135 - 145 (mEq/L)   Potassium 4.5  3.5 - 5.1 (mEq/L)   Chloride 106  96 - 112 (mEq/L)   CO2 25  19 - 32 (mEq/L)   Glucose, Bld 172 (*) 70 - 99 (mg/dL)   BUN 47 (*) 6 - 23 (mg/dL)   Creatinine, Ser 1.61 (*) 0.50 - 1.35 (mg/dL)   Calcium 8.9  8.4 - 09.6 (mg/dL)   GFR calc non Af Amer 40 (*) >90 (mL/min)   GFR calc Af Amer 46 (*) >90 (mL/min)  GLUCOSE, CAPILLARY      Component Value Range   Glucose-Capillary 191 (*) 70 - 99 (mg/dL)  CBC      Component Value Range   WBC 8.2  4.0 - 10.5 (K/uL)   RBC 2.90 (*) 4.22 - 5.81 (MIL/uL)   Hemoglobin 8.7 (*) 13.0 - 17.0 (g/dL)   HCT 04.5 (*) 40.9 - 52.0 (%)   MCV 91.7  78.0 - 100.0 (fL)   MCH 30.0  26.0 - 34.0 (pg)   MCHC 32.7  30.0 - 36.0 (g/dL)   RDW 81.1  91.4 - 78.2 (%)   Platelets 170  150 - 400 (K/uL)  BASIC METABOLIC PANEL      Component Value Range   Sodium 143  135 - 145 (mEq/L)   Potassium 4.6  3.5 - 5.1 (mEq/L)   Chloride 108  96 - 112 (mEq/L)   CO2 28  19 - 32 (mEq/L)   Glucose, Bld 167 (*) 70 - 99 (mg/dL)   BUN 41 (*) 6 - 23 (mg/dL)   Creatinine, Ser 9.56 (*) 0.50 - 1.35 (mg/dL)   Calcium 9.1   8.4 - 10.5 (mg/dL)   GFR calc non Af Amer 41 (*) >90 (mL/min)   GFR calc Af Amer 47 (*) >90 (mL/min)  PROTIME-INR      Component Value Range   Prothrombin Time 14.4  11.6 - 15.2 (seconds)   INR 1.10  0.00 - 1.49   GLUCOSE, CAPILLARY      Component Value Range   Glucose-Capillary 140 (*) 70 - 99 (mg/dL)  GLUCOSE, CAPILLARY      Component Value Range   Glucose-Capillary 191 (*) 70 - 99 (mg/dL)  GLUCOSE, CAPILLARY      Component Value Range   Glucose-Capillary 155 (*) 70 - 99 (mg/dL)  GLUCOSE, CAPILLARY      Component Value Range   Glucose-Capillary 176 (*) 70 - 99 (mg/dL)  GLUCOSE, CAPILLARY      Component Value Range   Glucose-Capillary 118 (*) 70 - 99 (mg/dL)  CBC      Component Value Range   WBC 7.7  4.0 - 10.5 (K/uL)   RBC 2.84 (*) 4.22 - 5.81 (MIL/uL)   Hemoglobin 8.5 (*) 13.0 - 17.0 (g/dL)   HCT 21.3 (*) 08.6 - 52.0 (%)   MCV 91.9  78.0 - 100.0 (fL)   MCH 29.9  26.0 - 34.0 (pg)   MCHC 32.6  30.0 - 36.0 (g/dL)   RDW 57.8  46.9 - 62.9 (%)   Platelets 157  150 - 400 (K/uL)  PROTIME-INR      Component Value Range   Prothrombin Time 16.4 (*) 11.6 - 15.2 (seconds)   INR 1.30  0.00 - 1.49   GLUCOSE, CAPILLARY  Component Value Range   Glucose-Capillary 151 (*) 70 - 99 (mg/dL)  GLUCOSE, CAPILLARY      Component Value Range   Glucose-Capillary 133 (*) 70 - 99 (mg/dL)   Comment 1 Notify RN      Exam: Neurovascular intact Sensation intact distally Incision - clean, dry, no drainage Motor function intact - moving foot and toes well on exam.   Assessment/Plan: 3 Days Post-Op Procedure(s) (LRB): TOTAL KNEE ARTHROPLASTY (Left) Procedure(s) (LRB): TOTAL KNEE ARTHROPLASTY (Left) Past Medical History  Diagnosis Date  . Arthritis     both knees -left  pain is worse  . Shingles     "long time ago"  . Diabetes mellitus     oral meds - no insulin  . Hypercholesterolemia   . BPH (benign prostatic hypertrophy)   . Hard of hearing     wears bilateral hearing aids    Principal Problem:  *OA (osteoarthritis) of knee   Up with therapy Discharge home with home health Diet - carb modified - medium Follow up - in 2 weeks Activity - WBAT Condition Upon Discharge - Good D/C Meds - See DC Summary DVT Prophylaxis - Coumadin Protocol with Lovenox coverage.    Taji Sather 02/11/2012, 10:11 AM

## 2012-02-11 NOTE — Progress Notes (Signed)
CARE MANAGEMENT NOTE 02/11/2012  Patient:  Daniel Cooke, Daniel Cooke   Account Number:  [id number]  Date Initiated:  02/11/2012  Documentation initiated by:  Colleen Can  Subjective/Objective Assessment:   DX osteoarthritis left knee; Total knee replacemnt on day of admission     Action/Plan:   CM spoke with patient regarding d/c plans. Plans are for patient to return to his home in [place], [place] where his spouse and daughter will be caregivers. States he already has RW and 3N1   Anticipated DC Date:  02/11/2012   Anticipated DC Plan:  HOME W HOME HEALTH SERVICES  In-house referral  NA      DC Planning Services  CM consult      [facility] Choice  HOME HEALTH   Choice offered to / List presented to:  C-1 Patient   DME arranged  NA      DME agency  NA     HH arranged  HH-1 RN  HH-2 PT      [facility] agency  [facility]   Status of service:  Completed, signed off Medicare Important Message given?  NA - LOS <3 / Initial given by admissions (If response is "NO", the following Medicare IM given date fields will be blank) Date Medicare IM given:   Date Additional Medicare IM given:    Discharge Disposition:  HOME W HOME HEALTH SERVICES  Per UR Regulation:    Comments:  02/11/2012 Pt is requesting Genevieve Norlander for [facility] services. Service were pre-aranged prior to hospital admission. Genevieve Norlander will start [facility] services 02/12/2012 (RN for coumadin management and PT for therapy). List of agencies for [facility] placed on shadow chart.

## 2012-02-12 ENCOUNTER — Encounter (HOSPITAL_COMMUNITY): Payer: Self-pay | Admitting: Orthopedic Surgery

## 2012-02-15 DIAGNOSIS — R7989 Other specified abnormal findings of blood chemistry: Secondary | ICD-10-CM | POA: Diagnosis not present

## 2012-02-15 DIAGNOSIS — D62 Acute posthemorrhagic anemia: Secondary | ICD-10-CM | POA: Diagnosis not present

## 2012-02-15 NOTE — Discharge Summary (Signed)
Physician Discharge Summary   Patient ID: Daniel Cooke MRN: [id number] DOB/AGE: 16-Feb-1924 78 y.o.  Admit date: 02/08/2012 Discharge date: 02/11/2012  Primary Diagnosis: Osteoarthritis Left greater than Right Knee  Admission Diagnoses: Past Medical History  Diagnosis Date  . Arthritis     both knees -left  pain is worse  . Shingles     "long time ago"  . Diabetes mellitus     oral meds - no insulin  . Hypercholesterolemia   . BPH (benign prostatic hypertrophy)   . Hard of hearing     wears bilateral hearing aids    Discharge Diagnoses:  Principal Problem:  *OA (osteoarthritis) of knee Active Problems:  Postop Azotemia  Postop Acute blood loss anemia   Procedure: Procedure(s) (LRB): TOTAL KNEE ARTHROPLASTY (Left)   Consults: None  HPI:  Daniel Cooke is a 78 y.o. year old male with end stage OA of his left knee with progressively worsening pain and dysfunction. He has constant pain, with activity and at rest and significant functional deficits with difficulties even with ADLs. He has had extensive non-op management including analgesics, injections of cortisone and viscosupplements, and home exercise program, but remains in significant pain with significant dysfunction. Radiographs show bone on bone arthritis medial and patellofemoral with varus deformity. He presents now for left Total Knee Arthroplasty.   Laboratory Data: Hospital Outpatient Visit on 01/28/2012  Component Date Value Range Status  . MRSA, PCR  01/28/2012 NEGATIVE  NEGATIVE Final  . Staphylococcus aureus  01/28/2012 NEGATIVE  NEGATIVE Final   Comment:                                 The Xpert SA Assay (FDA                          approved for NASAL specimens                          only), is one component of                          a comprehensive surveillance                          program.  It is not intended                          to diagnose infection nor to    guide or monitor treatment.  Marland Kitchen aPTT (seconds) 01/28/2012 35  24-37 Final  . WBC (K/uL) 01/28/2012 5.8  4.0-10.5 Final  . RBC (MIL/uL) 01/28/2012 3.79* 4.22-5.81 Final  . Hemoglobin (g/dL) 40/98/1191 47.8* 29.5-62.1 Final  . HCT (%) 01/28/2012 35.0* 39.0-52.0 Final  . MCV (fL) 01/28/2012 92.3  78.0-100.0 Final  . MCH (pg) 01/28/2012 29.3  26.0-34.0 Final  . MCHC (g/dL) 30/86/5784 69.6  29.5-28.4 Final  . RDW (%) 01/28/2012 13.4  11.5-15.5 Final  . Platelets (K/uL) 01/28/2012 238  150-400 Final  . Sodium (mEq/L) 01/28/2012 140  135-145 Final  . Potassium (mEq/L) 01/28/2012 4.5  3.5-5.1 Final  . Chloride (mEq/L) 01/28/2012 104  96-112 Final  . CO2 (mEq/L) 01/28/2012 28  19-32 Final  . Glucose, Bld (mg/dL) 13/24/4010 83  27-25 Final  . BUN (mg/dL) 36/64/4034 32* 7-42 Final  .  Creatinine, Ser (mg/dL) 45/40/9811 9.14  7.82-9.56 Final  . Calcium (mg/dL) 21/30/8657 84.6  9.6-29.5 Final  . Total Protein (g/dL) 28/41/3244 6.9  0.1-0.2 Final  . Albumin (g/dL) 72/53/6644 3.8  0.3-4.7 Final  . AST (U/L) 01/28/2012 20  0-37 Final  . ALT (U/L) 01/28/2012 18  0-53 Final  . Alkaline Phosphatase (U/L) 01/28/2012 79  39-117 Final  . Total Bilirubin (mg/dL) 42/59/5638 0.7  7.5-6.4 Final  . GFR calc non Af Amer (mL/min) 01/28/2012 49* >90 Final  . GFR calc Af Amer (mL/min) 01/28/2012 57* >90 Final   Comment:                                 The eGFR has been calculated                          using the CKD EPI equation.                          This calculation has not been                          validated in all clinical                          situations.                          eGFR's persistently                          <90 mL/min signify                          possible Chronic Kidney Disease.  Marland Kitchen Prothrombin Time (seconds) 01/28/2012 13.8  11.6-15.2 Final  . INR  01/28/2012 1.04  0.00-1.49 Final  . Color, Urine  01/28/2012 YELLOW  YELLOW Final  . APPearance  01/28/2012 CLEAR  CLEAR  Final  . Specific Gravity, Urine  01/28/2012 1.013  1.005-1.030 Final  . pH  01/28/2012 5.5  5.0-8.0 Final  . Glucose, UA (mg/dL) 33/29/5188 NEGATIVE  NEGATIVE Final  . Hgb urine dipstick  01/28/2012 NEGATIVE  NEGATIVE Final  . Bilirubin Urine  01/28/2012 NEGATIVE  NEGATIVE Final  . Ketones, ur (mg/dL) 41/66/0630 NEGATIVE  NEGATIVE Final  . Protein, ur (mg/dL) 16/12/930 NEGATIVE  NEGATIVE Final  . Urobilinogen, UA (mg/dL) 35/57/3220 0.2  2.5-4.2 Final  . Nitrite  01/28/2012 NEGATIVE  NEGATIVE Final  . Leukocytes, UA  01/28/2012 NEGATIVE  NEGATIVE Final   MICROSCOPIC NOT DONE ON URINES WITH NEGATIVE PROTEIN, BLOOD, LEUKOCYTES, NITRITE, OR GLUCOSE <1000 mg/dL.   No results found for this basename: HGB:5 in the last 72 hours No results found for this basename: WBC:2,RBC:2,HCT:2,PLT:2 in the last 72 hours No results found for this basename: NA:2,K:2,CL:2,CO2:2,BUN:2,CREATININE:2,GLUCOSE:2,CALCIUM:2 in the last 72 hours No results found for this basename: LABPT:2,INR:2 in the last 72 hours  X-Rays: Chest 2 View  01/28/2012  *RADIOLOGY REPORT*  Clinical Data: Preop knee replacement surgery  CHEST - 2 VIEW  Comparison: None.  Findings: Vascular clips in the right upper abdomen.  Heart size upper limits normal.  Lungs clear.  No effusion. Minimal spurring in the mid thoracic spine.  IMPRESSION:  1.  No acute disease  Original Report Authenticated By: Osa Craver, M.D.    EKG: Orders placed during the hospital encounter of 02/08/12  . EKG     Hospital Course: Patient was admitted to [facility] and taken to the OR and underwent the above state procedure without complications.  Patient tolerated the procedure well and was later transferred to the recovery room and then to the orthopaedic floor for postoperative care.  They were given PO and IV analgesics for pain control following their surgery.  They were given 24 hours of postoperative antibiotics and started on DVT  prophylaxis.   PT and OT were ordered for total joint protocol.  Discharge planning consulted to help with postop disposition and equipment needs.  Patient had a very tough night on the evening of surgery due to pain and nausea and vomiting but started to get up with therapy on day one.  PCA was discontinued and they were weaned over to PO meds.  Hemovac drain was pulled without difficulty.  Slight bump in BUN and Creat - Monitor, Give fluids due to nausea and vomiting. Recheck labs. Hold Glucophage.  Seen on day two. Patient had complaints of pain yesterday with nausea and vomiting. N/V has resolved and tolerating Ultram for now. He had urinary retention yesterday requiring Lasix dose. He had a slight bump in his renal function also. That is stable today. We were watching his I&O's very closely and had ordered for the foley to stay in until POD 2. The foley was removed this morning due to hospital regulations for the 48 hour requirement despite having an order to remain in and not remove. Fortunately, his renal function stabilized and his urinary output increased. Hi was voiding on his own now and probably had avoided any acute renal failure. Overall, he was doing better now. He continued  to progress with therapy into day two once he was feeling better and walked over 90 feet.  Dressing was changed on day two and the incision was healing well.  By day three, the patient had progressed with therapy and meeting goals.  Incision was healing well. His renal function stabilized. He stated that he was voiding well. Patient was seen in rounds and was ready to go home.  Discharge Medications: Prior to Admission medications   Medication Sig Start Date End Date Taking? Authorizing Provider  glipiZIDE (GLUCOTROL) 10 MG tablet Take 10 mg by mouth 2 (two) times daily before a meal.   Yes Historical Provider, MD  lisinopril (PRINIVIL,ZESTRIL) 20 MG tablet Take 20 mg by mouth at bedtime.   Yes Historical Provider, MD    metFORMIN (GLUCOPHAGE) 1000 MG tablet Take 1,000 mg by mouth 2 (two) times daily with a meal.   Yes Historical Provider, MD  pioglitazone (ACTOS) 15 MG tablet Take 15 mg by mouth at bedtime.   Yes Historical Provider, MD  simvastatin (ZOCOR) 20 MG tablet Take 20 mg by mouth at bedtime.   Yes Historical Provider, MD  terazosin (HYTRIN) 5 MG capsule Take 5 mg by mouth at bedtime.   Yes Historical Provider, MD  aspirin EC 81 MG tablet Take 81 mg by mouth at bedtime.    Historical Provider, MD  enoxaparin (LOVENOX) 40 MG/0.4ML SOLN Inject 0.4 mLs (40 mg total) into the skin daily. Injection daily for three more days until Coumadin Level has been titrated up. 02/11/12   Annalia Metzger Julien Girt, PA  iron polysaccharides (FERREX 150) 150 MG capsule Take 1 capsule (150  mg total) by mouth daily. 02/10/12 02/09/13  Ebelin Dillehay Julien Girt, PA  methocarbamol (ROBAXIN) 500 MG tablet Take 1 tablet (500 mg total) by mouth every 6 (six) hours as needed. 02/10/12 02/20/12  Chimamanda Siegfried Julien Girt, PA  traMADol (ULTRAM) 50 MG tablet Take 1-2 tablets (50-100 mg total) by mouth every 6 (six) hours as needed. 02/10/12 02/20/12  Blanche Scovell, PA  warfarin (COUMADIN) 5 MG tablet Take 1 tablet (5 mg total) by mouth daily at 6 PM. Take for three weeks and then discontinue.  Dose may be adjusted to titrate INR level between 2.0 and 3.0. 02/11/12 02/10/13  Marquet Faircloth Julien Girt, PA    Diet: carb modified - medium Activity:WBAT Follow-up:in 2 weeks Disposition: Home Discharged Condition: good   Discharge Orders    Future Orders Please Complete By Expires   Diet - low sodium heart healthy      Call MD / Call 911      Comments:   If you experience chest pain or shortness of breath, CALL 911 and be transported to the hospital emergency room.  If you develope a fever above 101 F, pus (white drainage) or increased drainage or redness at the wound, or calf pain, call your surgeon's office.   Constipation Prevention      Comments:   Drink  plenty of fluids.  Prune juice may be helpful.  You may use a stool softener, such as Colace (over the counter) 100 mg twice a day.  Use MiraLax (over the counter) for constipation as needed.   Increase activity slowly as tolerated      Weight Bearing as taught in Physical Therapy      Comments:   Use a walker or crutches as instructed.   Discharge instructions      Comments:   Pick up stool softner and laxative for home. Do not submerge incision under water. May shower. Continue to use ice for pain and swelling from surgery.    Driving restrictions      Comments:   No driving   Lifting restrictions      Comments:   No lifting   TED hose      Comments:   Use stockings (TED hose) for 3 weeks on both leg(s).  You may remove them at night for sleeping.   Change dressing      Comments:   Change dressing daily with sterile 4 x 4 inch gauze dressing and apply TED hose.   Do not put a pillow under the knee. Place it under the heel.        Medication List  As of 02/15/2012 10:47 AM   STOP taking these medications         naproxen sodium 220 MG tablet         TAKE these medications         aspirin EC 81 MG tablet   Take 81 mg by mouth at bedtime.      enoxaparin 40 MG/0.4ML Soln   Commonly known as: LOVENOX   Inject 0.4 mLs (40 mg total) into the skin daily. Injection daily for three more days until Coumadin Level has been titrated up.      glipiZIDE 10 MG tablet   Commonly known as: GLUCOTROL   Take 10 mg by mouth 2 (two) times daily before a meal.      iron polysaccharides 150 MG capsule   Commonly known as: NIFEREX   Take 1 capsule (150 mg total) by mouth daily.  lisinopril 20 MG tablet   Commonly known as: PRINIVIL,ZESTRIL   Take 20 mg by mouth at bedtime.      metFORMIN 1000 MG tablet   Commonly known as: GLUCOPHAGE   Take 1,000 mg by mouth 2 (two) times daily with a meal.      methocarbamol 500 MG tablet   Commonly known as: ROBAXIN   Take 1 tablet (500 mg  total) by mouth every 6 (six) hours as needed.      pioglitazone 15 MG tablet   Commonly known as: ACTOS   Take 15 mg by mouth at bedtime.      simvastatin 20 MG tablet   Commonly known as: ZOCOR   Take 20 mg by mouth at bedtime.      terazosin 5 MG capsule   Commonly known as: HYTRIN   Take 5 mg by mouth at bedtime.      traMADol 50 MG tablet   Commonly known as: ULTRAM   Take 1-2 tablets (50-100 mg total) by mouth every 6 (six) hours as needed.      warfarin 5 MG tablet   Commonly known as: COUMADIN   Take 1 tablet (5 mg total) by mouth daily at 6 PM. Take for three weeks and then discontinue.  Dose may be adjusted to titrate INR level between 2.0 and 3.0.           Follow-up Information    Follow up with [name], MD. Schedule an appointment as soon as possible for a visit in 2 weeks.   Contact information:   [facility] [address] [place] [address] [phone]          Signed: Patrica Duel 02/15/2012, 10:47 AM

## 2012-06-13 ENCOUNTER — Ambulatory Visit (INDEPENDENT_AMBULATORY_CARE_PROVIDER_SITE_OTHER): Payer: Medicare Other | Admitting: Ophthalmology

## 2012-06-13 DIAGNOSIS — H35039 Hypertensive retinopathy, unspecified eye: Secondary | ICD-10-CM

## 2012-06-13 DIAGNOSIS — I10 Essential (primary) hypertension: Secondary | ICD-10-CM

## 2012-06-13 DIAGNOSIS — H33009 Unspecified retinal detachment with retinal break, unspecified eye: Secondary | ICD-10-CM

## 2012-06-13 DIAGNOSIS — E1139 Type 2 diabetes mellitus with other diabetic ophthalmic complication: Secondary | ICD-10-CM

## 2012-06-13 DIAGNOSIS — H43819 Vitreous degeneration, unspecified eye: Secondary | ICD-10-CM

## 2012-06-13 DIAGNOSIS — E11319 Type 2 diabetes mellitus with unspecified diabetic retinopathy without macular edema: Secondary | ICD-10-CM

## 2012-06-13 DIAGNOSIS — H3530 Unspecified macular degeneration: Secondary | ICD-10-CM

## 2012-06-13 DIAGNOSIS — E1165 Type 2 diabetes mellitus with hyperglycemia: Secondary | ICD-10-CM

## 2012-07-11 ENCOUNTER — Telehealth (INDEPENDENT_AMBULATORY_CARE_PROVIDER_SITE_OTHER): Payer: Self-pay | Admitting: General Surgery

## 2012-07-12 ENCOUNTER — Encounter (INDEPENDENT_AMBULATORY_CARE_PROVIDER_SITE_OTHER): Payer: Self-pay | Admitting: Surgery

## 2012-07-12 ENCOUNTER — Ambulatory Visit (INDEPENDENT_AMBULATORY_CARE_PROVIDER_SITE_OTHER): Payer: Medicare Other | Admitting: Surgery

## 2012-07-12 VITALS — BP 138/56 | HR 64 | Temp 97.8°F | Resp 16 | Ht 70.5 in | Wt 199.8 lb

## 2012-07-12 DIAGNOSIS — L0501 Pilonidal cyst with abscess: Secondary | ICD-10-CM

## 2012-07-12 NOTE — Progress Notes (Signed)
Subjective:     Patient ID: Daniel Cooke, male   DOB: May 29, 1924, 78 y.o.   MRN: [id number]  HPI  STEVIN BIELINSKI  Aug 29, 1924 [id number]  Patient Care Team: Hollice Espy, MD as PCP - General (Family Medicine)  This patient is a 78 y.o.male who presents today for surgical evaluation at the request of Dr. Kevan Ny.   Reason for visit: Drain off of buttock.  Probable abscess.  Patient is a pleasant elderly male.  No prior history of skin infections.  Felt pain last week on his tailbone.  Became worse.  Began to open up.  Saw his primary care physician yesterday.  She was concerned and recommended surgical evaluation.  He was started on oral doxycycline.  No fevers chills or sweats  Patient Active Problem List  Diagnosis  . OA (osteoarthritis) of knee  . Postop Azotemia  . Postop Acute blood loss anemia  . Pilonidal abscess    Past Medical History  Diagnosis Date  . Arthritis     both knees -left  pain is worse  . Shingles     "long time ago"  . Diabetes mellitus     oral meds - no insulin  . Hypercholesterolemia   . BPH (benign prostatic hypertrophy)   . Hard of hearing     wears bilateral hearing aids    Past Surgical History  Procedure Date  . Cholecystectomy   . Left rotator cuff repair   . Appendectomy   . Tonsillectomy   . Right knee arthroscopy   . Eye surgery     left traumatic cataract removed--injury to the eye--states his eyesight is ok in left eye  . Vascectomy   . Total knee arthroplasty 02/08/2012    Procedure: TOTAL KNEE ARTHROPLASTY;  Surgeon: [name], MD;  Location: [facility];  Service: Orthopedics;  Laterality: Left;    History   Social History  . Marital Status: Married    Spouse Name: N/A    Number of Children: N/A  . Years of Education: N/A   Occupational History  . Not on file.   Social History Main Topics  . Smoking status: Never Smoker   . Smokeless tobacco: Never Used  . Alcohol Use: Yes     occasionally  . Drug Use: No   . Sexually Active:    Other Topics Concern  . Not on file   Social History Narrative  . No narrative on file    History reviewed. No pertinent family history.  Current Outpatient Prescriptions  Medication Sig Dispense Refill  . aspirin EC 81 MG tablet Take 81 mg by mouth at bedtime.      Marland Kitchen doxycycline (VIBRA-TABS) 100 MG tablet Take 100 mg by mouth Twice daily.      Marland Kitchen enoxaparin (LOVENOX) 40 MG/0.4ML SOLN Inject 0.4 mLs (40 mg total) into the skin daily. Injection daily for three more days until Coumadin Level has been titrated up.  3 Syringe  0  . glipiZIDE (GLUCOTROL) 10 MG tablet Take 10 mg by mouth 2 (two) times daily before a meal.      . iron polysaccharides (FERREX 150) 150 MG capsule Take 1 capsule (150 mg total) by mouth daily.  21 capsule  0  . lisinopril (PRINIVIL,ZESTRIL) 20 MG tablet Take 20 mg by mouth at bedtime.      . metFORMIN (GLUCOPHAGE) 1000 MG tablet Take 1,000 mg by mouth 2 (two) times daily with a meal.      .  pioglitazone (ACTOS) 15 MG tablet Take 15 mg by mouth at bedtime.      . simvastatin (ZOCOR) 20 MG tablet Take 20 mg by mouth at bedtime.      Marland Kitchen terazosin (HYTRIN) 5 MG capsule Take 5 mg by mouth at bedtime.         Allergies  Allergen Reactions  . Codeine Nausea And Vomiting  . Oxycodone Nausea And Vomiting    BP 138/56  Pulse 64  Temp 97.8 F (36.6 C) (Temporal)  Resp 16  Ht 5' 10.5" (1.791 m)  Wt 199 lb 12.8 oz (90.629 kg)  BMI 28.26 kg/m2  No results found.   Review of Systems  Constitutional: Negative for fever, chills and diaphoresis.  HENT: Positive for hearing loss. Negative for sore throat, trouble swallowing and neck pain.   Eyes: Negative for photophobia and visual disturbance.  Respiratory: Negative for choking and shortness of breath.   Cardiovascular: Negative for chest pain and palpitations.  Gastrointestinal: Negative for nausea, vomiting, abdominal distention, anal bleeding and rectal pain.  Genitourinary: Negative for  dysuria, urgency, difficulty urinating and testicular pain.  Musculoskeletal: Positive for arthralgias. Negative for myalgias and gait problem.  Skin: Positive for wound. Negative for color change and rash.  Neurological: Negative for dizziness, speech difficulty, weakness and numbness.  Hematological: Negative for adenopathy.  Psychiatric/Behavioral: Negative for hallucinations, confusion and agitation.       Objective:   Physical Exam  Constitutional: He is oriented to person, place, and time. He appears well-developed and well-nourished. No distress.  HENT:  Head: Normocephalic.  Mouth/Throat: Oropharynx is clear and moist. No oropharyngeal exudate.  Eyes: Conjunctivae and EOM are normal. Pupils are equal, round, and reactive to light. No scleral icterus.  Neck: Normal range of motion. No tracheal deviation present.  Cardiovascular: Normal rate, normal heart sounds and intact distal pulses.   Pulmonary/Chest: Effort normal. No respiratory distress.  Abdominal: Soft. He exhibits no distension. There is no tenderness. Hernia confirmed negative in the right inguinal area and confirmed negative in the left inguinal area.  Musculoskeletal: Normal range of motion. He exhibits no tenderness.       Back:  Neurological: He is alert and oriented to person, place, and time. No cranial nerve deficit. He exhibits normal muscle tone. Coordination normal.  Skin: Skin is warm and dry. No rash noted. He is not diaphoretic.  Psychiatric: He has a normal mood and affect. His behavior is normal.       Assessment:     Pilonidal abscess - draining    Plan:     I explored the wound.  I debrided out some necrotic soft tissue and abscess.  I packed the wound with some iodoform gauze.  He tolerated well.  Continue oral doxycycline antibiotics.  Will need which change in a few days in followup to make sure this area heals up.  If it worsens or does not heal up, he may require a more aggressive  incision and debridement or even excision.  I would like to hold off and see if the area closes down with packing and antibiotics

## 2012-07-12 NOTE — Telephone Encounter (Signed)
openned in error

## 2012-07-12 NOTE — Patient Instructions (Addendum)
WOUND CARE  It is important that the wound be kept open.   -Keeping the skin edges apart will allow the wound to gradually heal from the base upwards.   - If the skin edges of the wound close too early, a new fluid pocket can form and infection can occur. -This is the reason to pack deeper wounds with gauze or ribbon -This is why drained wounds cannot be sewed closed right away  A healthy wound should form a lining of bright red "beefy" granulating tissue that will help shrink the wound and help the edges grow new skin into it.   -A little mucus / yellow discharge is normal (the body's natural way to try and form a scab) and should be gently washed off with soap and water with daily dressing changes.  -Green or foul smelling drainage implies bacterial colonization and can slow wound healing - a short course of antibiotic ointment (3-5 days) can help it clear up.  Call the doctor if it does not improve or worsens  -Avoid use of antibiotic ointments for more than a week as they can slow wound healing over time.    -Sometimes other wound care products will be used to reduce need for dressing changes and/or help clean up dirty wounds -Sometimes the surgeon needs to debride the wound in the office to remove dead or infected tissue out of the wound so it can heal more quickly and safely.    Change the dressing at least once a day -Wash the wound with mild soap and water gently every day.  It is good to shower or bathe the wound to help it clean out. -Use clean 4x4 gauze for medium/large wounds or ribbon plain NU-gauze for smaller wounds (it does not need to be sterile, just clean) -Keep the raw wound moist with a little saline or KY (saline) gel on the gauze.  -A dry wound will take longer to heal.  -Keep the skin dry around the wound to prevent breakdown and irritation. -Pack the wound down to the base -The goal is to keep the skin apart, not overpack the wound -Use a Q-tip or blunt-tipped kabob  stick toothpick to push the gauze down to the base in narrow or deep wounds   -Cover with a clean gauze and tape -paper or Medipore tape tend to be gentle on the skin -rotate the orientation of the tape to avoid repeated stress/trauma on the skin -using an ACE or Coban wrap on wounds on arms or legs can be used instead.  Complete all antibiotics through the entire prescription to help the infection heal and prevent new places of infection   Returning the see the surgeon is helpful to follow the healing process and help the wound close as fast as possible.  Pilonidal Cyst A pilonidal cyst occurs when hairs get trapped (ingrown) beneath the skin in the crease between the buttocks over your sacrum (the bone under that crease). Pilonidal cysts are most common in young men with a lot of body hair. When the cyst is ruptured (breaks) or leaking, fluid from the cyst may cause burning and itching. If the cyst becomes infected, it causes a painful swelling filled with pus (abscess). The pus and trapped hairs need to be removed (often by lancing) so that the infection can heal. However, recurrence is common and an operation may be needed to remove the cyst. HOME CARE INSTRUCTIONS   If the cyst was NOT INFECTED:   Keep the  area clean and dry. Bathe or shower daily. Wash the area well with a germ-killing soap. Warm tub baths may help prevent infection and help with drainage. Dry the area well with a towel.   Avoid tight clothing to keep area as moisture free as possible.   Keep area between buttocks as free of hair as possible. A depilatory may be used.   If the cyst WAS INFECTED and needed to be drained:   Your caregiver packed the wound with gauze to keep the wound open. This allows the wound to heal from the inside outwards and continue draining.   Return for a wound check in 1 day or as suggested.   If you take tub baths or showers, repack the wound with gauze following them. Sponge baths (at the  sink) are a good alternative.   If an antibiotic was ordered to fight the infection, take as directed.   Only take over-the-counter or prescription medicines for pain, discomfort, or fever as directed by your caregiver.   After the drain is removed, use sitz baths for 20 minutes 4 times per day. Clean the wound gently with mild unscented soap, pat dry, and then apply a dry dressing.  SEEK MEDICAL CARE IF:   You have increased pain, swelling, redness, drainage, or bleeding from the area.   You have a fever.   You have muscles aches, dizziness, or a general ill feeling.  Document Released: 11/27/2000 Document Revised: 11/19/2011 Document Reviewed: 01/25/2009 [facility] Patient Information 2012 [place], [place].

## 2012-07-15 ENCOUNTER — Ambulatory Visit (INDEPENDENT_AMBULATORY_CARE_PROVIDER_SITE_OTHER): Payer: Medicare Other | Admitting: General Surgery

## 2012-07-15 DIAGNOSIS — Z4800 Encounter for change or removal of nonsurgical wound dressing: Secondary | ICD-10-CM

## 2012-07-15 NOTE — Patient Instructions (Signed)
You can change the gauze daily until it stops draining. Keep appt next week.

## 2012-07-15 NOTE — Progress Notes (Signed)
Patient comes in status post I and D of a pilonidal abscess by Dr Michaell Cowing on Tuesday 07/12/12. He comes in today for a packing change. I removed gauze and there was no 1/4 inch iodoform in place. I had Dr Magnus Ivan look at the wound. He probed this and also found no iodoform. I repacked with the corner of a sterile 2x2 gauze per Dr Magnus Ivan. I advised patient he could change this outside packing daily and to call with any trouble prior to his follow up already scheduled with Dr Michaell Cowing.

## 2012-07-19 ENCOUNTER — Telehealth (INDEPENDENT_AMBULATORY_CARE_PROVIDER_SITE_OTHER): Payer: Self-pay | Admitting: General Surgery

## 2012-07-19 NOTE — Telephone Encounter (Signed)
Patient's daughter was concerned when she changed his dressing and there was no wick. I explained to the daughter we did not place another wick but just tucked the corner of a gauze into it and that would have came out with the dressing change. She will call with any problems with the wound prior to appt next Monday.

## 2012-07-25 ENCOUNTER — Ambulatory Visit (INDEPENDENT_AMBULATORY_CARE_PROVIDER_SITE_OTHER): Payer: Medicare Other | Admitting: Surgery

## 2012-07-25 ENCOUNTER — Encounter (INDEPENDENT_AMBULATORY_CARE_PROVIDER_SITE_OTHER): Payer: Self-pay | Admitting: Surgery

## 2012-07-25 VITALS — BP 132/68 | HR 72 | Temp 97.4°F | Resp 16 | Ht 70.5 in | Wt 200.0 lb

## 2012-07-25 DIAGNOSIS — L0501 Pilonidal cyst with abscess: Secondary | ICD-10-CM

## 2012-07-25 NOTE — Progress Notes (Signed)
Subjective:     Patient ID: Daniel Cooke, male   DOB: 11/04/1924, 78 y.o.   MRN: [id number]  HPI   Daniel Cooke  [birth year] [id number]  Patient Care Team: Hollice Espy, MD as PCP - General (Family Medicine)  This patient is a 78 y.o.male who presents today for surgical evaluation at the request of Dr. Kevan Ny.   Reason for visit: Followup on drainage of pilonidal abscess. 07/12/2012  Patient is a pleasant elderly male.  No prior history of skin infections.  Felt pain on his tailbone.  Became worse.  Began to open up.  Saw his primary care physician.  He was started on oral doxycycline.  No fevers chills or sweats.  I hope open up and drain an abscess near his tailbone.  Probably pilonidal.  He is no longer able to pack it.  His complete antibiotics.  He no longer has pain.  He feels much better.  Patient Active Problem List  Diagnosis  . OA (osteoarthritis) of knee  . Postop Azotemia  . Postop Acute blood loss anemia  . Pilonidal abscess    Past Medical History  Diagnosis Date  . Arthritis     both knees -left  pain is worse  . Shingles     "long time ago"  . Diabetes mellitus     oral meds - no insulin  . Hypercholesterolemia   . BPH (benign prostatic hypertrophy)   . Hard of hearing     wears bilateral hearing aids  . Pilonidal cyst     Past Surgical History  Procedure Date  . Cholecystectomy   . Left rotator cuff repair   . Appendectomy   . Tonsillectomy   . Right knee arthroscopy   . Eye surgery     left traumatic cataract removed--injury to the eye--states his eyesight is ok in left eye  . Vascectomy   . Total knee arthroplasty 02/08/2012    Procedure: TOTAL KNEE ARTHROPLASTY;  Surgeon: [name], MD;  Location: [facility];  Service: Orthopedics;  Laterality: Left;    History   Social History  . Marital Status: Married    Spouse Name: N/A    Number of Children: N/A  . Years of Education: N/A   Occupational History  . Not on file.    Social History Main Topics  . Smoking status: Never Smoker   . Smokeless tobacco: Never Used  . Alcohol Use: Yes     occasionally  . Drug Use: No  . Sexually Active: Not on file   Other Topics Concern  . Not on file   Social History Narrative  . No narrative on file    No family history on file.  Current Outpatient Prescriptions  Medication Sig Dispense Refill  . aspirin EC 81 MG tablet Take 81 mg by mouth at bedtime.      Marland Kitchen doxycycline (VIBRA-TABS) 100 MG tablet Take 100 mg by mouth Twice daily.      Marland Kitchen glipiZIDE (GLUCOTROL) 10 MG tablet Take 10 mg by mouth 2 (two) times daily before a meal.      . iron polysaccharides (FERREX 150) 150 MG capsule Take 1 capsule (150 mg total) by mouth daily.  21 capsule  0  . lisinopril (PRINIVIL,ZESTRIL) 20 MG tablet Take 20 mg by mouth at bedtime.      . metFORMIN (GLUCOPHAGE) 1000 MG tablet Take 1,000 mg by mouth 2 (two) times daily with a meal.      .  pioglitazone (ACTOS) 15 MG tablet Take 15 mg by mouth at bedtime.      . simvastatin (ZOCOR) 20 MG tablet Take 20 mg by mouth at bedtime.      Marland Kitchen terazosin (HYTRIN) 5 MG capsule Take 5 mg by mouth at bedtime.         Allergies  Allergen Reactions  . Codeine Nausea And Vomiting  . Oxycodone Nausea And Vomiting    BP 132/68  Pulse 72  Temp 97.4 F (36.3 C) (Temporal)  Resp 16  Ht 5' 10.5" (1.791 m)  Wt 200 lb (90.719 kg)  BMI 28.29 kg/m2  No results found.   Review of Systems  Constitutional: Negative for fever, chills and diaphoresis.  HENT: Positive for hearing loss. Negative for sore throat, trouble swallowing and neck pain.   Eyes: Negative for photophobia and visual disturbance.  Respiratory: Negative for choking and shortness of breath.   Cardiovascular: Negative for chest pain and palpitations.  Gastrointestinal: Negative for nausea, vomiting, abdominal distention, anal bleeding and rectal pain.  Genitourinary: Negative for dysuria, urgency, difficulty urinating and  testicular pain.  Musculoskeletal: Positive for arthralgias. Negative for myalgias and gait problem.  Skin: Positive for wound. Negative for color change and rash.  Neurological: Negative for dizziness, speech difficulty, weakness and numbness.  Hematological: Negative for adenopathy.  Psychiatric/Behavioral: Negative for hallucinations, confusion and agitation.       Objective:   Physical Exam  Constitutional: He is oriented to person, place, and time. He appears well-developed and well-nourished. No distress.  HENT:  Head: Normocephalic.  Mouth/Throat: Oropharynx is clear and moist. No oropharyngeal exudate.  Eyes: Conjunctivae and EOM are normal. Pupils are equal, round, and reactive to light. No scleral icterus.  Neck: Normal range of motion. No tracheal deviation present.  Cardiovascular: Normal rate, normal heart sounds and intact distal pulses.   Pulmonary/Chest: Effort normal. No respiratory distress.  Abdominal: Soft. He exhibits no distension. There is no tenderness. Hernia confirmed negative in the right inguinal area and confirmed negative in the left inguinal area.  Musculoskeletal: Normal range of motion. He exhibits no tenderness.       Back:  Neurological: He is alert and oriented to person, place, and time. No cranial nerve deficit. He exhibits normal muscle tone. Coordination normal.  Skin: Skin is warm and dry. No rash noted. He is not diaphoretic.  Psychiatric: He has a normal mood and affect. His behavior is normal.       Assessment:     Pilonidal abscess - nearly healed after draining    Plan:     Stay off antibiotics  Keep the area clean and dry.  If he has a persistent mass or it recurs, he may need more formal excision.  However given his advanced age with no prior history of problems, it is reasonable just to follow this conservatively.  Because he is improved I think he can followup when necessary.  He feels reassured.

## 2012-07-25 NOTE — Patient Instructions (Addendum)
Pilonidal Cyst A pilonidal cyst occurs when hairs get trapped (ingrown) beneath the skin in the crease between the buttocks over your sacrum (the bone under that crease). Pilonidal cysts are most common in young men with a lot of body hair. When the cyst is ruptured (breaks) or leaking, fluid from the cyst may cause burning and itching. If the cyst becomes infected, it causes a painful swelling filled with pus (abscess). The pus and trapped hairs need to be removed (often by lancing) so that the infection can heal. However, recurrence is common and an operation may be needed to remove the cyst. HOME CARE INSTRUCTIONS   If the cyst was NOT INFECTED:   Keep the area clean and dry. Bathe or shower daily. Wash the area well with a germ-killing soap. Warm tub baths may help prevent infection and help with drainage. Dry the area well with a towel.   Avoid tight clothing to keep area as moisture free as possible.   Keep area between buttocks as free of hair as possible. A depilatory may be used.   If the cyst WAS INFECTED and needed to be drained:   Your caregiver packed the wound with gauze to keep the wound open. This allows the wound to heal from the inside outwards and continue draining.   Return for a wound check in 1 day or as suggested.   If you take tub baths or showers, repack the wound with gauze following them. Sponge baths (at the sink) are a good alternative.   If an antibiotic was ordered to fight the infection, take as directed.   Only take over-the-counter or prescription medicines for pain, discomfort, or fever as directed by your caregiver.   After the drain is removed, use sitz baths for 20 minutes 4 times per day. Clean the wound gently with mild unscented soap, pat dry, and then apply a dry dressing.  SEEK MEDICAL CARE IF:   You have increased pain, swelling, redness, drainage, or bleeding from the area.   You have a fever.   You have muscles aches, dizziness, or a  general ill feeling.  Document Released: 11/27/2000 Document Revised: 11/19/2011 Document Reviewed: 01/25/2009 Exit[employer] Patient Information 2012 [employer]. 

## 2013-06-08 ENCOUNTER — Encounter: Payer: Self-pay | Admitting: Internal Medicine

## 2013-06-13 ENCOUNTER — Ambulatory Visit (INDEPENDENT_AMBULATORY_CARE_PROVIDER_SITE_OTHER): Payer: Self-pay | Admitting: Ophthalmology

## 2014-02-13 ENCOUNTER — Other Ambulatory Visit: Payer: Self-pay | Admitting: Orthopedic Surgery

## 2014-02-19 ENCOUNTER — Encounter (HOSPITAL_COMMUNITY): Payer: Self-pay | Admitting: Pharmacy Technician

## 2014-02-22 ENCOUNTER — Ambulatory Visit (HOSPITAL_COMMUNITY)
Admission: RE | Admit: 2014-02-22 | Discharge: 2014-02-22 | Disposition: A | Discharge status: Home / Self Care | Payer: Medicare Other | Source: Ambulatory Visit | Attending: Orthopedic Surgery | Admitting: Orthopedic Surgery

## 2014-02-22 ENCOUNTER — Encounter (HOSPITAL_COMMUNITY)
Admission: RE | Admit: 2014-02-22 | Discharge: 2014-02-22 | Disposition: A | Discharge status: Home / Self Care | Payer: Medicare Other | Source: Ambulatory Visit | Attending: Orthopedic Surgery | Admitting: Orthopedic Surgery

## 2014-02-22 ENCOUNTER — Encounter (HOSPITAL_COMMUNITY): Payer: Self-pay

## 2014-02-22 ENCOUNTER — Other Ambulatory Visit: Payer: Self-pay

## 2014-02-22 DIAGNOSIS — Z01812 Encounter for preprocedural laboratory examination: Secondary | ICD-10-CM | POA: Insufficient documentation

## 2014-02-22 DIAGNOSIS — Z01818 Encounter for other preprocedural examination: Secondary | ICD-10-CM | POA: Insufficient documentation

## 2014-02-22 DIAGNOSIS — Z01810 Encounter for preprocedural cardiovascular examination: Secondary | ICD-10-CM | POA: Insufficient documentation

## 2014-02-22 DIAGNOSIS — R9431 Abnormal electrocardiogram [ECG] [EKG]: Secondary | ICD-10-CM | POA: Insufficient documentation

## 2014-02-22 LAB — BASIC METABOLIC PANEL
BUN: 35 mg/dL — ABNORMAL HIGH (ref 6–23)
CHLORIDE: 107 meq/L (ref 96–112)
CO2: 27 meq/L (ref 19–32)
Calcium: 10 mg/dL (ref 8.4–10.5)
Creatinine, Ser: 1.59 mg/dL — ABNORMAL HIGH (ref 0.50–1.35)
GFR calc Af Amer: 43 mL/min — ABNORMAL LOW (ref >90)
GFR calc non Af Amer: 37 mL/min — ABNORMAL LOW (ref >90)
Glucose, Bld: 165 mg/dL — ABNORMAL HIGH (ref 70–99)
POTASSIUM: 4.5 meq/L (ref 3.7–5.3)
SODIUM: 144 meq/L (ref 137–147)

## 2014-02-22 LAB — CBC
HCT: 34.1 % — ABNORMAL LOW (ref 39.0–52.0)
Hemoglobin: 11.7 g/dL — ABNORMAL LOW (ref 13.0–17.0)
MCH: 31.9 pg (ref 26.0–34.0)
MCHC: 34.3 g/dL (ref 30.0–36.0)
MCV: 92.9 fL (ref 78.0–100.0)
PLATELETS: 168 10*3/uL (ref 150–400)
RBC: 3.67 MIL/uL — AB (ref 4.22–5.81)
RDW: 13.6 % (ref 11.5–15.5)
WBC: 5.2 10*3/uL (ref 4.0–10.5)

## 2014-02-22 NOTE — Pre-Procedure Instructions (Signed)
PT'S PREOP BMET REPORT FAXED TO DR. ALUISIO'S OFFICE FOR REVIEW OF ABNORMALS

## 2014-02-22 NOTE — Patient Instructions (Signed)
   YOUR SURGERY IS SCHEDULED AT [facility]  ON:  Wednesday  3/18  REPORT TO  SHORT STAY CENTER AT:  8:00 AM      PHONE # FOR SHORT STAY IS [phone]  DO NOT EAT OR DRINK ANYTHING AFTER MIDNIGHT THE NIGHT BEFORE YOUR SURGERY.  YOU MAY BRUSH YOUR TEETH, RINSE OUT YOUR MOUTH--BUT NO WATER, NO FOOD, NO CHEWING GUM, NO MINTS, NO CANDIES, NO CHEWING TOBACCO.  PLEASE TAKE THE FOLLOWING MEDICATIONS THE AM OF YOUR SURGERY WITH A FEW SIPS OF WATER:   NO MEDICINES TO TAKE AM SURGERY   IF YOU ARE DIABETIC:  DO NOT TAKE ANY DIABETIC MEDICATIONS THE AM OF YOUR SURGERY.    DO NOT BRING VALUABLES, MONEY, CREDIT CARDS.  DO NOT WEAR JEWELRY, MAKE-UP, NAIL POLISH AND NO METAL PINS OR CLIPS IN YOUR HAIR. CONTACT LENS, DENTURES / PARTIALS, GLASSES SHOULD NOT BE WORN TO SURGERY AND IN MOST CASES-HEARING AIDS WILL NEED TO BE REMOVED.  BRING YOUR GLASSES CASE, ANY EQUIPMENT NEEDED FOR YOUR CONTACT LENS. FOR PATIENTS ADMITTED TO THE HOSPITAL--CHECK OUT TIME THE DAY OF DISCHARGE IS 11:00 AM.  ALL INPATIENT ROOMS ARE PRIVATE - WITH BATHROOM, TELEPHONE, TELEVISION AND WIFI INTERNET.  IF YOU ARE BEING DISCHARGED THE SAME DAY OF YOUR SURGERY--YOU CAN NOT DRIVE YOURSELF HOME--AND SHOULD NOT GO HOME ALONE BY TAXI OR BUS.  NO DRIVING OR OPERATING MACHINERY, OR MAKING LEGAL DECISIONS FOR 24 HOURS FOLLOWING ANESTHESIA / PAIN MEDICATIONS.  PLEASE MAKE ARRANGEMENTS FOR SOMEONE TO BE WITH YOU AT HOME THE FIRST 24 HOURS AFTER SURGERY. RESPONSIBLE DRIVER'S NAME / PHONE                            CONNIE DODGIN  [phone]      -  PT'S DAUGHTER                                                   PLEASE READ OVER ANY  FACT SHEETS THAT YOU WERE GIVEN: INCENTIVE SPIROMETER INFORMATION.  FAILURE TO FOLLOW THESE INSTRUCTIONS MAY RESULT IN THE CANCELLATION OF YOUR SURGERY. PLEASE BE AWARE THAT YOU MAY NEED ADDITIONAL BLOOD DRAWN DAY OF YOUR SURGERY  PATIENT SIGNATURE_________________________________

## 2014-02-22 NOTE — Pre-Procedure Instructions (Signed)
EKG AND CXR WERE DONE TODAY - PREOP AT [facility]. 

## 2014-02-27 DIAGNOSIS — M659 Synovitis and tenosynovitis, unspecified: Secondary | ICD-10-CM | POA: Diagnosis present

## 2014-02-27 DIAGNOSIS — M65969 Unspecified synovitis and tenosynovitis, unspecified lower leg: Secondary | ICD-10-CM

## 2014-02-27 HISTORY — DX: Unspecified synovitis and tenosynovitis, unspecified lower leg: M65.969

## 2014-02-27 HISTORY — DX: Synovitis and tenosynovitis, unspecified: M65.9

## 2014-02-27 NOTE — H&P (Signed)
CC- Daniel Cooke is a 80 y.o. male who presents with left knee pain.  HPI- . Knee Pain: Patient presents with knee pain involving the  left knee. Onset of the symptoms was several months ago. Inciting event: none known. Current symptoms include crepitus sensation and swelling. Pain is aggravated by going up and down stairs and rising after sitting.  Patient has had prior knee problems. Evaluation to date: plain films: TKA in normal position with no periprosthetic abnormalities. Treatment to date: aspiration with negative infection work-up. He has an exam and history consistent with patellar clunk syndrome and presents now for arthroscopy and synovectomy..  Past Medical History  Diagnosis Date  . Arthritis     both knees -left  pain is worse  . Shingles     "long time ago"  . Diabetes mellitus     oral meds - no insulin  . Hypercholesterolemia   . BPH (benign prostatic hypertrophy)   . Hard of hearing     wears bilateral hearing aids  . Pilonidal cyst     PAST HX - NO PROBLEM NOW    Past Surgical History  Procedure Laterality Date  . Cholecystectomy    . Left rotator cuff repair    . Appendectomy    . Tonsillectomy    . Right knee arthroscopy    . Eye surgery      left traumatic cataract removed--injury to the eye--states his eyesight is ok in left eye  . Vascectomy    . Total knee arthroplasty  02/08/2012    Procedure: TOTAL KNEE ARTHROPLASTY;  Surgeon: [name], MD;  Location: [facility];  Service: Orthopedics;  Laterality: Left;    Prior to Admission medications   Medication Sig Start Date End Date Taking? Authorizing Provider  aspirin EC 81 MG tablet Take 81 mg by mouth at bedtime.    Historical Provider, MD  glipiZIDE (GLUCOTROL) 10 MG tablet Take 10 mg by mouth every evening.     Historical Provider, MD  iron polysaccharides (FERREX 150) 150 MG capsule Take 1 capsule (150 mg total) by mouth daily. 02/10/12 02/09/13  Alexzandrew Perkins, PA-C  lisinopril  (PRINIVIL,ZESTRIL) 20 MG tablet Take 20 mg by mouth at bedtime.    Historical Provider, MD  metFORMIN (GLUCOPHAGE) 1000 MG tablet Take 1,000 mg by mouth 2 (two) times daily with a meal.    Historical Provider, MD  Multiple Vitamin (MULTIVITAMIN WITH MINERALS) TABS tablet Take 1 tablet by mouth daily.    Historical Provider, MD  naproxen sodium (ANAPROX) 220 MG tablet Take 440 mg by mouth 2 (two) times daily as needed (pain).    Historical Provider, MD  pioglitazone (ACTOS) 15 MG tablet Take 15 mg by mouth at bedtime.    Historical Provider, MD  simvastatin (ZOCOR) 20 MG tablet Take 20 mg by mouth at bedtime.    Historical Provider, MD  tamsulosin (FLOMAX) 0.4 MG CAPS capsule Take 0.4 mg by mouth daily after supper.    Historical Provider, MD  terazosin (HYTRIN) 5 MG capsule Take 5 mg by mouth at bedtime.    Historical Provider, MD   KNEE EXAM antalgic gait, effusion, collateral ligaments intact, no warmth or erythema,ROM- 0-120  Physical Examination: General appearance - alert, well appearing, and in no distress Mental status - alert, oriented to person, place, and time Chest - clear to auscultation, no wheezes, rales or rhonchi, symmetric air entry Heart - normal rate, regular rhythm, normal S1, S2, no murmurs, rubs, clicks or  gallops Abdomen - soft, nontender, nondistended, no masses or organomegaly Neurological - alert, oriented, normal speech, no focal findings or movement disorder noted   Asessment/Plan--- Left knee hypertrophic synovitis- - Plan left knee arthroscopy with synovectomy. Procedure risks and potential comps discussed with patient who elects to proceed. Goals are decreased pain and increased function with a high likelihood of achieving both

## 2014-02-28 ENCOUNTER — Ambulatory Visit (HOSPITAL_COMMUNITY): Payer: Medicare Other | Admitting: Certified Registered Nurse Anesthetist

## 2014-02-28 ENCOUNTER — Encounter (HOSPITAL_COMMUNITY)
Admission: RE | Disposition: A | Discharge status: Home / Self Care | Payer: Self-pay | Source: Ambulatory Visit | Attending: Orthopedic Surgery

## 2014-02-28 ENCOUNTER — Ambulatory Visit (HOSPITAL_COMMUNITY)
Admission: RE | Admit: 2014-02-28 | Discharge: 2014-02-28 | Disposition: A | Discharge status: Home / Self Care | Payer: Medicare Other | Source: Ambulatory Visit | Attending: Orthopedic Surgery | Admitting: Orthopedic Surgery

## 2014-02-28 ENCOUNTER — Encounter (HOSPITAL_COMMUNITY): Payer: Medicare Other | Admitting: Certified Registered Nurse Anesthetist

## 2014-02-28 ENCOUNTER — Encounter (HOSPITAL_COMMUNITY): Payer: Self-pay | Admitting: Certified Registered Nurse Anesthetist

## 2014-02-28 DIAGNOSIS — E119 Type 2 diabetes mellitus without complications: Secondary | ICD-10-CM | POA: Insufficient documentation

## 2014-02-28 DIAGNOSIS — I10 Essential (primary) hypertension: Secondary | ICD-10-CM | POA: Insufficient documentation

## 2014-02-28 DIAGNOSIS — M65969 Unspecified synovitis and tenosynovitis, unspecified lower leg: Secondary | ICD-10-CM | POA: Diagnosis present

## 2014-02-28 DIAGNOSIS — M6580 Other synovitis and tenosynovitis, unspecified site: Secondary | ICD-10-CM | POA: Insufficient documentation

## 2014-02-28 DIAGNOSIS — M659 Synovitis and tenosynovitis, unspecified: Secondary | ICD-10-CM | POA: Diagnosis present

## 2014-02-28 HISTORY — PX: KNEE ARTHROSCOPY: SHX127

## 2014-02-28 LAB — GLUCOSE, CAPILLARY
Glucose-Capillary: 126 mg/dL — ABNORMAL HIGH (ref 70–99)
Glucose-Capillary: 127 mg/dL — ABNORMAL HIGH (ref 70–99)

## 2014-02-28 SURGERY — ARTHROSCOPY, KNEE
Anesthesia: General | Site: Knee | Laterality: Left

## 2014-02-28 MED ORDER — TRAMADOL HCL 50 MG PO TABS: 50.0000 mg | ORAL_TABLET | Freq: Four times a day (QID) | ORAL | Status: DC | PRN

## 2014-02-28 MED ORDER — SODIUM CHLORIDE 0.9 % IV SOLN: INTRAVENOUS | Status: DC

## 2014-02-28 MED ORDER — GLYCOPYRROLATE 0.2 MG/ML IJ SOLN
INTRAMUSCULAR | Status: DC | PRN
  Administered 2014-02-28: .3 mg via INTRAVENOUS

## 2014-02-28 MED ORDER — GLYCOPYRROLATE 0.2 MG/ML IJ SOLN
INTRAMUSCULAR | Status: AC
  Filled 2014-02-28: qty 3

## 2014-02-28 MED ORDER — SODIUM CHLORIDE 0.9 % IJ SOLN
INTRAMUSCULAR | Status: AC
  Filled 2014-02-28: qty 10

## 2014-02-28 MED ORDER — PROPOFOL 10 MG/ML IV BOLUS
INTRAVENOUS | Status: AC
  Filled 2014-02-28: qty 20

## 2014-02-28 MED ORDER — METHOCARBAMOL 500 MG PO TABS: 500.0000 mg | ORAL_TABLET | Freq: Four times a day (QID) | ORAL | Status: DC

## 2014-02-28 MED ORDER — CHLORHEXIDINE GLUCONATE 4 % EX LIQD: 60.0000 mL | Freq: Once | CUTANEOUS | Status: DC

## 2014-02-28 MED ORDER — ONDANSETRON HCL 4 MG/2ML IJ SOLN
INTRAMUSCULAR | Status: DC | PRN
  Administered 2014-02-28 (×2): 2 mg via INTRAVENOUS

## 2014-02-28 MED ORDER — CEFAZOLIN SODIUM-DEXTROSE 2-3 GM-% IV SOLR
INTRAVENOUS | Status: AC
  Filled 2014-02-28: qty 50

## 2014-02-28 MED ORDER — FENTANYL CITRATE 0.05 MG/ML IJ SOLN
INTRAMUSCULAR | Status: AC
  Filled 2014-02-28: qty 5

## 2014-02-28 MED ORDER — PROMETHAZINE HCL 25 MG/ML IJ SOLN: 6.2500 mg | INTRAMUSCULAR | Status: DC | PRN

## 2014-02-28 MED ORDER — DEXAMETHASONE SODIUM PHOSPHATE 10 MG/ML IJ SOLN: 10.0000 mg | Freq: Once | INTRAMUSCULAR | Status: DC

## 2014-02-28 MED ORDER — PROPOFOL 10 MG/ML IV BOLUS
INTRAVENOUS | Status: DC | PRN
  Administered 2014-02-28: 150 mg via INTRAVENOUS

## 2014-02-28 MED ORDER — LIDOCAINE HCL (CARDIAC) 20 MG/ML IV SOLN
INTRAVENOUS | Status: DC | PRN
  Administered 2014-02-28: 100 mg via INTRAVENOUS

## 2014-02-28 MED ORDER — LACTATED RINGERS IR SOLN
Status: DC | PRN
  Administered 2014-02-28: 8000 mL

## 2014-02-28 MED ORDER — FENTANYL CITRATE 0.05 MG/ML IJ SOLN
INTRAMUSCULAR | Status: DC | PRN
  Administered 2014-02-28: 50 ug via INTRAVENOUS
  Administered 2014-02-28 (×2): 25 ug via INTRAVENOUS
  Administered 2014-02-28 (×2): 50 ug via INTRAVENOUS

## 2014-02-28 MED ORDER — EPHEDRINE SULFATE 50 MG/ML IJ SOLN
INTRAMUSCULAR | Status: AC
  Filled 2014-02-28: qty 1

## 2014-02-28 MED ORDER — LACTATED RINGERS IV SOLN
INTRAVENOUS | Status: DC | PRN
  Administered 2014-02-28 (×2): via INTRAVENOUS

## 2014-02-28 MED ORDER — ACETAMINOPHEN 10 MG/ML IV SOLN
1000.0000 mg | Freq: Once | INTRAVENOUS | Status: AC
  Administered 2014-02-28: 1000 mg via INTRAVENOUS
  Filled 2014-02-28: qty 100

## 2014-02-28 MED ORDER — LIDOCAINE HCL (CARDIAC) 20 MG/ML IV SOLN
INTRAVENOUS | Status: AC
  Filled 2014-02-28: qty 5

## 2014-02-28 MED ORDER — MEPERIDINE HCL 50 MG/ML IJ SOLN: 6.2500 mg | INTRAMUSCULAR | Status: DC | PRN

## 2014-02-28 MED ORDER — FENTANYL CITRATE 0.05 MG/ML IJ SOLN: 25.0000 ug | INTRAMUSCULAR | Status: DC | PRN

## 2014-02-28 MED ORDER — ATROPINE SULFATE 0.4 MG/ML IJ SOLN
INTRAMUSCULAR | Status: AC
  Filled 2014-02-28: qty 1

## 2014-02-28 MED ORDER — CEFAZOLIN SODIUM-DEXTROSE 2-3 GM-% IV SOLR
2.0000 g | INTRAVENOUS | Status: AC
  Administered 2014-02-28: 2 g via INTRAVENOUS

## 2014-02-28 MED ORDER — BUPIVACAINE-EPINEPHRINE PF 0.25-1:200000 % IJ SOLN
INTRAMUSCULAR | Status: AC
  Filled 2014-02-28: qty 30

## 2014-02-28 MED ORDER — PHENYLEPHRINE HCL 10 MG/ML IJ SOLN
INTRAMUSCULAR | Status: AC
Start: 2014-02-28 — End: 2014-02-28
  Filled 2014-02-28: qty 1

## 2014-02-28 MED ORDER — BUPIVACAINE-EPINEPHRINE 0.25% -1:200000 IJ SOLN
INTRAMUSCULAR | Status: DC | PRN
  Administered 2014-02-28: 20 mL

## 2014-02-28 SURGICAL SUPPLY — 26 items
BANDAGE ELASTIC 6 VELCRO ST LF (GAUZE/BANDAGES/DRESSINGS) ×1 IMPLANT
BLADE 4.2CUDA (BLADE) ×2 IMPLANT
CLOTH BEACON ORANGE TIMEOUT ST (SAFETY) ×2 IMPLANT
COUNTER NEEDLE 20 DBL MAG RED (NEEDLE) ×2 IMPLANT
CUFF TOURN SGL QUICK 34 (TOURNIQUET CUFF) ×2
CUFF TRNQT CYL 34X4X40X1 (TOURNIQUET CUFF) ×1 IMPLANT
DRAPE U-SHAPE 47X51 STRL (DRAPES) ×2 IMPLANT
DRSG EMULSION OIL 3X3 NADH (GAUZE/BANDAGES/DRESSINGS) ×2 IMPLANT
DURAPREP 26ML APPLICATOR (WOUND CARE) ×2 IMPLANT
GLOVE BIO SURGEON STRL SZ8 (GLOVE) ×2 IMPLANT
GLOVE BIOGEL PI IND STRL 8 (GLOVE) ×1 IMPLANT
GLOVE BIOGEL PI INDICATOR 8 (GLOVE) ×1
GOWN STRL REUS W/TWL LRG LVL3 (GOWN DISPOSABLE) ×2 IMPLANT
MANIFOLD NEPTUNE II (INSTRUMENTS) ×2 IMPLANT
PACK ARTHROSCOPY WL (CUSTOM PROCEDURE TRAY) ×2 IMPLANT
PACK ICE MAXI GEL EZY WRAP (MISCELLANEOUS) ×6 IMPLANT
PAD ABD 8X10 STRL (GAUZE/BANDAGES/DRESSINGS) ×1 IMPLANT
PADDING CAST COTTON 6X4 STRL (CAST SUPPLIES) ×3 IMPLANT
POSITIONER SURGICAL ARM (MISCELLANEOUS) ×2 IMPLANT
SET ARTHROSCOPY TUBING (MISCELLANEOUS) ×2
SET ARTHROSCOPY TUBING LN (MISCELLANEOUS) ×1 IMPLANT
SPONGE GAUZE 4X4 12PLY (GAUZE/BANDAGES/DRESSINGS) ×1 IMPLANT
SUT ETHILON 4 0 PS 2 18 (SUTURE) ×2 IMPLANT
TOWEL OR 17X26 10 PK STRL BLUE (TOWEL DISPOSABLE) ×2 IMPLANT
WAND 90 DEG TURBOVAC W/CORD (SURGICAL WAND) ×1 IMPLANT
WRAP KNEE MAXI GEL POST OP (GAUZE/BANDAGES/DRESSINGS) ×2 IMPLANT

## 2014-02-28 NOTE — Anesthesia Preprocedure Evaluation (Addendum)
Anesthesia Evaluation  Patient identified by MRN, date of birth, ID band Patient awake    Reviewed: Allergy & Precautions, H&P , NPO status , Patient's Chart, lab work & pertinent test results  History of Anesthesia Complications Negative for: history of anesthetic complications  Airway Mallampati: II TM Distance: >3 FB Neck ROM: Full    Dental  (+) Teeth Intact, Caps Patient grinds teeth: all upper and lower incisors ground down:   Pulmonary neg pulmonary ROS,  breath sounds clear to auscultation  Pulmonary exam normal       Cardiovascular hypertension, Pt. on medications Rhythm:Regular Rate:Normal     Neuro/Psych Hard of hearing negative neurological ROS  negative psych ROS   GI/Hepatic negative GI ROS, Neg liver ROS,   Endo/Other  diabetes, Well Controlled, Type 2, Oral Hypoglycemic Agents  Renal/GU negative Renal ROS   BPH negative genitourinary   Musculoskeletal negative musculoskeletal ROS (+)   Abdominal   Peds negative pediatric ROS (+)  Hematology negative hematology ROS (+)   Anesthesia Other Findings   Reproductive/Obstetrics negative OB ROS                           Anesthesia Physical  Anesthesia Plan  ASA: II  Anesthesia Plan:    Post-op Pain Management:    Induction: Intravenous  Airway Management Planned: LMA  Additional Equipment:   Intra-op Plan:   Post-operative Plan:   Informed Consent: I have reviewed the patients History and Physical, chart, labs and discussed the procedure including the risks, benefits and alternatives for the proposed anesthesia with the patient or authorized representative who has indicated his/her understanding and acceptance.   Dental advisory given  Plan Discussed with: CRNA  Anesthesia Plan Comments:        Anesthesia Quick Evaluation

## 2014-02-28 NOTE — Op Note (Signed)
NAMBing Neighbors:  Panchal, Eliyohu              ACCOUNT NO.:  [id number]  MEDICAL RECORD NO.:  [id number]  LOCATION:  [facility]                         [facility]  PHYSICIAN:  Ollen GrossFrank Kerrington Sova, M.D.    DATE OF BIRTH:  1924/11/28  DATE OF PROCEDURE:  02/28/2014 DATE OF DISCHARGE:  02/28/2014                              OPERATIVE REPORT   PREOPERATIVE DIAGNOSIS:  Left knee hypertrophic synovitis.  POSTOPERATIVE DIAGNOSIS:  Left knee hypertrophic synovitis.  PROCEDURE:  Left knee arthroscopy with synovectomy.  SURGEON:  Ollen GrossFrank Yannis Gumbs, MD.  ASSISTANT:  None.  ANESTHESIA:  General.  ESTIMATED BLOOD LOSS:  Minimal.  DRAINS:  None.  COMPLICATIONS:  None.  CONDITION:  Stable to recovery.  BRIEF CLINICAL NOTE:  Mr. Mary SellaCockman is an 80 year old male, who had a left total knee arthroplasty done several years ago.  He was doing well and then developed painful popping in the knee consistent with the patellar clunk syndrome.  He presents now for arthroscopy and synovectomy.  PROCEDURE IN DETAIL:  After successful administration of general anesthetic, a tourniquet was placed high on the left thigh.  The left lower extremity was prepped and draped in the usual sterile fashion. Standard superomedial and inferolateral portals were placed.  The inferior cannula passed, superomedial camera passed inferolateral. Arthroscopic visualization proceeded.  There was a large area of hypertrophic tissue at the junction of the quadriceps tendon and the patellar component.  This was occupying a large amount of the suprapatellar pouch.  The knife was passed in the site marked for the superolateral portal and then the dilator placed.  We then placed a shaver, and using a combination of the shaver and the ArthroCare, the hypertrophic tissue was debrided.  The ArthroCare was used for hemostasis.  This effectively recreated the suprapatellar pouch.  We similarly removed any inflamed tissue from the lateral medial  gutters. The joint looked fine at this point.  The arthroscopic equipments were removed from the lateral portals, which were closed with interrupted 4-0 nylon.  A 20 mL of 0.25% Marcaine with epinephrine was injected through the inflow cannula which was removed and that portal closed with nylon.  Incisions cleaned and dried and bulky sterile dressing applied.  He was then awakened and transported to recovery in stable condition.     Ollen GrossFrank Aprill Banko, M.D.     FA/MEDQ  D:  02/28/2014  T:  02/28/2014  Job:  [id number]

## 2014-02-28 NOTE — Preoperative (Addendum)
Beta Blockers   Reason not to administer Beta Blockers:Not Applicable 

## 2014-02-28 NOTE — Interval H&P Note (Signed)
History and Physical Interval Note:  02/28/2014 9:45 AM  Daniel Cooke  has presented today for surgery, with the diagnosis of Left Knee Hypertrophic Synovitis  The various methods of treatment have been discussed with the patient and family. After consideration of risks, benefits and other options for treatment, the patient has consented to  Procedure(s): ARTHROSCOPY LEFT KNEE WITH SYNOVECTOMY (Left) as a surgical intervention .  The patient's history has been reviewed, patient examined, no change in status, stable for surgery.  I have reviewed the patient's chart and labs.  Questions were answered to the patient's satisfaction.     Loanne DrillingALUISIO,Tamsin Nader V

## 2014-02-28 NOTE — Brief Op Note (Signed)
02/28/2014  10:35 AM  PATIENT:  Floy SabinaErnest P Lasota  80 y.o. male  PRE-OPERATIVE DIAGNOSIS:  Left Knee Hypertrophic Synovitis  POST-OPERATIVE DIAGNOSIS:  Left Knee Hypertrophic Synovitis  PROCEDURE:  Procedure(s): ARTHROSCOPY LEFT KNEE WITH SYNOVECTOMY (Left)  SURGEON:  Surgeon(s) and Role:    * Loanne DrillingFrank V Gerrie Castiglia, MD - Primary  PHYSICIAN ASSISTANT:   ASSISTANTS: none   ANESTHESIA:   general  EBL:  Total I/O In: 700 [I.V.:700] Out: -   BLOOD ADMINISTERED:none  DRAINS: none   LOCAL MEDICATIONS USED:  MARCAINE     SPECIMEN:  No Specimen  DISPOSITION OF SPECIMEN:  N/A  COUNTS:  YES  TOURNIQUET:    DICTATION: .Other Dictation: Dictation Number [id number]  PLAN OF CARE: Discharge to home after PACU  PATIENT DISPOSITION:  PACU - hemodynamically stable.

## 2014-02-28 NOTE — Discharge Instructions (Signed)
Arthroscopic Procedure, Knee °An arthroscopic procedure can find what is wrong with your knee. °PROCEDURE °Arthroscopy is a surgical technique that allows your orthopedic surgeon to diagnose and treat your knee injury with accuracy. They will look into your knee through a small instrument. This is almost like a small (pencil sized) telescope. Because arthroscopy affects your knee less than open knee surgery, you can anticipate a more rapid recovery. Taking an active role by following your caregiver's instructions will help with rapid and complete recovery. Use crutches, rest, elevation, ice, and knee exercises as instructed. The length of recovery depends on various factors including type of injury, age, physical condition, medical conditions, and your rehabilitation. °Your knee is the joint between the large bones (femur and tibia) in your leg. Cartilage covers these bone ends which are smooth and slippery and allow your knee to bend and move smoothly. Two menisci, thick, semi-lunar shaped pads of cartilage which form a rim inside the joint, help absorb shock and stabilize your knee. Ligaments bind the bones together and support your knee joint. Muscles move the joint, help support your knee, and take stress off the joint itself. Because of this all programs and physical therapy to rehabilitate an injured or repaired knee require rebuilding and strengthening your muscles. °AFTER THE PROCEDURE °· After the procedure, you will be moved to a recovery area until most of the effects of the medication have worn off. Your caregiver will discuss the test results with you.  °· Only take over-the-counter or prescription medicines for pain, discomfort, or fever as directed by your caregiver.  °SEEK MEDICAL CARE IF:  °· You have increased bleeding from your wounds.  °· You see redness, swelling, or have increasing pain in your wounds.  °· You have pus coming from your wound.  °· You have an oral temperature above 102° F (38.9°  C).  °· You notice a bad smell coming from the wound or dressing.  °· You have severe pain with any motion of your knee.  °SEEK IMMEDIATE MEDICAL CARE IF:  °· You develop a rash.  °· You have difficulty breathing.  °· You have any allergic problems.  °FURTHER INSTRUCTIONS: °· You may start showering two days after being discharged home but do not submerge the incisions under water.  °· Change dressing 48 hours after the procedure and then cover the small incisions with band aids until your follow up visit. °· Avoid periods of inactivity such as sitting longer than an hour when not asleep. This helps prevent blood clots.  °· You may put full weight on your legs and walk as much as is comfortable.  °· Do not drive while taking narcotics.  °Wear the elastic stockings for three weeks following surgery during the day but you may remove then at night. °· Make sure you keep all of your appointments after your operation with all of your doctors and caregivers. You should call the office at [phone] and make an appointment for approximately one week after the date of your surgery. °· Please pick up a stool softener and laxative for home use as long as you are requiring pain medications. °· Continue to use ice on the knee for pain and swelling from surgery. You may notice swelling that will progress down to the foot and ankle.  This is normal after surgery.  Elevate the leg when you are not up walking on it.   °RANGE OF MOTION AND STRENGTHENING EXERCISES  °Rehabilitation of the knee is   important following a knee injury or an operation. After just a few days of immobilization, the muscles of the thigh which control the knee become weakened and shrink (atrophy). Knee exercises are designed to build up the tone and strength of the thigh muscles and to improve knee motion. Often times heat used for twenty to thirty minutes before working out will loosen up your tissues and help with improving the range of motion but do not  use heat for the first two weeks following surgery. These exercises can be done on a training (exercise) mat, on the floor, on a table or on a bed. Use what ever works the best and is most comfortable for you Knee exercises include: ° ° ° ° ° ° °QUAD STRENGTHENING EXERCISES °Strengthening Quadriceps Sets ° °Tighten muscles on top of thigh by pushing knees down into floor or table. °Hold for 20 seconds. Repeat 10 times. °Do 2 sessions per day. ° ° ° °Strengthening Terminal Knee Extension ° °With knee bent over bolster, straighten knee by tightening muscle on top of thigh. Be sure to keep bottom of knee on bolster. °Hold for 20 seconds. Repeat 10 times. °Do 2 sessions per day. ° ° °Straight Leg with Bent Knee ° °Lie on back with opposite leg bent. Keep involved knee slightly bent at knee and raise leg 4-6". Hold for 10 seconds. °Repeat 20 times per set. °Do 2 sets per session. °Do 2 sessions per day. ° °

## 2014-02-28 NOTE — Transfer of Care (Signed)
Immediate Anesthesia Transfer of Care Note  Patient: Daniel Cooke  Procedure(s) Performed: Procedure(s): ARTHROSCOPY LEFT KNEE WITH SYNOVECTOMY (Left)  Patient Location: PACU  Anesthesia Type:General  Level of Consciousness: awake, alert , oriented and patient cooperative  Airway & Oxygen Therapy: Patient Spontanous Breathing and Patient connected to face mask oxygen  Post-op Assessment: Report given to PACU RN, Post -op Vital signs reviewed and stable and Patient moving all extremities  Post vital signs: Reviewed and stable  Complications: No apparent anesthesia complications

## 2014-02-28 NOTE — Anesthesia Procedure Notes (Signed)
Procedure Name: LMA Insertion Date/Time: 02/28/2014 9:54 AM Performed by: Edison PaceGRAY, Jameson Tormey E Pre-anesthesia Checklist: Patient identified, Patient being monitored, Timeout performed, Emergency Drugs available and Suction available Patient Re-evaluated:Patient Re-evaluated prior to inductionOxygen Delivery Method: Circle system utilized Preoxygenation: Pre-oxygenation with 100% oxygen Intubation Type: IV induction LMA: LMA inserted LMA Size: 4.0 Number of attempts: 1 Placement Confirmation: positive ETCO2 Tube secured with: Tape Dental Injury: Teeth and Oropharynx as per pre-operative assessment

## 2014-03-01 ENCOUNTER — Encounter (HOSPITAL_COMMUNITY): Payer: Self-pay | Admitting: Orthopedic Surgery

## 2014-03-01 NOTE — Anesthesia Postprocedure Evaluation (Addendum)
  Anesthesia Post-op Note  Patient: Daniel Cooke  Procedure(s) Performed: Procedure(s) (LRB): ARTHROSCOPY LEFT KNEE WITH SYNOVECTOMY (Left)  Patient Location: PACU  Anesthesia Type: GA  Level of Consciousness: awake and alert   Airway and Oxygen Therapy: Patient Spontanous Breathing  Post-op Pain: mild  Post-op Assessment: Post-op Vital signs reviewed, Patient's Cardiovascular Status Stable, Respiratory Function Stable, Patent Airway and No signs of Nausea or vomiting  Last Vitals:  Filed Vitals:   02/28/14 1143  BP: 138/64  Pulse: 66  Temp: 36.1 C  Resp: 18    Post-op Vital Signs: stable   Complications: No apparent anesthesia complications

## 2014-03-02 NOTE — Addendum Note (Signed)
Addendum created 03/02/14 1310 by Phillips GroutPeter Katrinka Herbison, MD   Modules edited: Anesthesia Responsible Staff

## 2015-12-15 HISTORY — PX: KYPHOPLASTY: SHX5884

## 2016-01-16 ENCOUNTER — Other Ambulatory Visit: Payer: Self-pay | Admitting: Physical Medicine and Rehabilitation

## 2016-01-16 DIAGNOSIS — M4856XA Collapsed vertebra, not elsewhere classified, lumbar region, initial encounter for fracture: Secondary | ICD-10-CM | POA: Diagnosis not present

## 2016-01-16 DIAGNOSIS — S32010A Wedge compression fracture of first lumbar vertebra, initial encounter for closed fracture: Secondary | ICD-10-CM

## 2016-01-16 DIAGNOSIS — M545 Low back pain: Secondary | ICD-10-CM | POA: Diagnosis not present

## 2016-01-17 DIAGNOSIS — S32000A Wedge compression fracture of unspecified lumbar vertebra, initial encounter for closed fracture: Secondary | ICD-10-CM | POA: Diagnosis not present

## 2016-01-17 DIAGNOSIS — M4856XD Collapsed vertebra, not elsewhere classified, lumbar region, subsequent encounter for fracture with routine healing: Secondary | ICD-10-CM | POA: Diagnosis not present

## 2016-01-21 ENCOUNTER — Other Ambulatory Visit: Payer: Self-pay | Admitting: Physical Medicine and Rehabilitation

## 2016-01-21 ENCOUNTER — Ambulatory Visit
Admission: RE | Admit: 2016-01-21 | Discharge: 2016-01-21 | Disposition: A | Discharge status: Home / Self Care | Payer: Medicare Other | Source: Ambulatory Visit | Attending: Physical Medicine and Rehabilitation | Admitting: Physical Medicine and Rehabilitation

## 2016-01-21 DIAGNOSIS — S32010A Wedge compression fracture of first lumbar vertebra, initial encounter for closed fracture: Secondary | ICD-10-CM

## 2016-01-21 DIAGNOSIS — S32010S Wedge compression fracture of first lumbar vertebra, sequela: Secondary | ICD-10-CM
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
## 2016-01-23 ENCOUNTER — Ambulatory Visit
Admission: RE | Admit: 2016-01-23 | Discharge: 2016-01-23 | Disposition: A | Discharge status: Home / Self Care | Payer: Medicare Other | Source: Ambulatory Visit | Attending: Physical Medicine and Rehabilitation | Admitting: Physical Medicine and Rehabilitation

## 2016-01-23 ENCOUNTER — Other Ambulatory Visit: Payer: Self-pay | Admitting: Physical Medicine and Rehabilitation

## 2016-01-23 VITALS — BP 136/76 | HR 81 | Temp 97.7°F | Resp 21

## 2016-01-23 DIAGNOSIS — S32010S Wedge compression fracture of first lumbar vertebra, sequela: Secondary | ICD-10-CM

## 2016-01-23 DIAGNOSIS — S32010G Wedge compression fracture of first lumbar vertebra, subsequent encounter for fracture with delayed healing: Secondary | ICD-10-CM

## 2016-01-23 DIAGNOSIS — S32000G Wedge compression fracture of unspecified lumbar vertebra, subsequent encounter for fracture with delayed healing: Secondary | ICD-10-CM

## 2016-01-23 MED ORDER — KETOROLAC TROMETHAMINE 30 MG/ML IJ SOLN
30.0000 mg | Freq: Once | INTRAMUSCULAR | Status: AC
  Administered 2016-01-23: 30 mg via INTRAVENOUS

## 2016-01-23 MED ORDER — FENTANYL CITRATE (PF) 100 MCG/2ML IJ SOLN
25.0000 ug | INTRAMUSCULAR | Status: DC | PRN
  Administered 2016-01-23 (×2): 25 ug via INTRAVENOUS

## 2016-01-23 MED ORDER — MIDAZOLAM HCL 2 MG/2ML IJ SOLN
1.0000 mg | INTRAMUSCULAR | Status: DC | PRN
  Administered 2016-01-23: 1 mg via INTRAVENOUS

## 2016-01-23 MED ORDER — CEFAZOLIN SODIUM-DEXTROSE 2-3 GM-% IV SOLR
2.0000 g | Freq: Once | INTRAVENOUS | Status: AC
  Administered 2016-01-23: 2 g via INTRAVENOUS

## 2016-01-23 MED ORDER — SODIUM CHLORIDE 0.9 % IV SOLN
Freq: Once | INTRAVENOUS | Status: AC
  Administered 2016-01-23: 10:00:00 via INTRAVENOUS

## 2016-01-23 NOTE — Progress Notes (Addendum)
34 minutes of sedation for L1 VP.  Dr. Alfredo Batty notified at 1110 of patient being in AFib/AFlutter.  Ventricular rate 70s-90s.  Vital signs stable, otherwise.

## 2016-01-23 NOTE — Discharge Instructions (Signed)
Vertebroplasty Post Procedure Discharge Instructions  1. May resume a regular diet and any medications that you routinely take (including pain medications). 2. No driving day of procedure. 3. Upon discharge go home and rest for at least 4 hours.  May use an ice pack as needed to injection sites on back. 4. Remove bandades after shower in the morning, replace with bandade daily until healed. 5. Follow up with your physician re: bone therapy. 6. Follow up with Dr. Alfredo Batty in 2 weeks.    Please contact our office at [phone] for the following symptoms:   Fever greater than 100 degrees  Increased swelling, pain, or redness at injection site.   Thank you for visiting [facility].

## 2016-02-02 NOTE — Progress Notes (Signed)
Electrophysiology Office Note   Date:  02/04/2016   ID:  Daniel Cooke, DOB 14-Dec-1924, MRN [id number]  PCP:  Hollice Espy, MD  Electrophysiologist:  Regan Lemming, MD    Chief Complaint  Patient presents with  . New Patient (Initial Visit)     History of Present Illness: Daniel Cooke is a 82 y.o. male who presents today for electrophysiology evaluation.   He has a history of hypertension, diabetes, and hyperlipidemia. He fell 2 weeks ago and had a vertebral fracture. He says that when he was being seen for that it was noticed that he was in atrial fibrillation. He says that he has some minor shortness of breath and fatigue that he initially attributed to old age. He says that he is very active and continues to work on the weekends at a [employer]. He is otherwise asymptomatic and has no palpitations.   Today, he denies symptoms of palpitations, chest pain, shortness of breath, orthopnea, PND, lower extremity edema, claudication, dizziness, presyncope, syncope, bleeding, or neurologic sequela. The patient is tolerating medications without difficulties and is otherwise without complaint today.    Past Medical History  Diagnosis Date  . Arthritis     both knees -left  pain is worse  . Shingles     "long time ago"  . Diabetes mellitus     oral meds - no insulin  . Hypercholesterolemia   . BPH (benign prostatic hypertrophy)   . Hard of hearing     wears bilateral hearing aids  . Pilonidal cyst     PAST HX - NO PROBLEM NOW   Past Surgical History  Procedure Laterality Date  . Cholecystectomy    . Left rotator cuff repair    . Appendectomy    . Tonsillectomy    . Right knee arthroscopy    . Eye surgery      left traumatic cataract removed--injury to the eye--states his eyesight is ok in left eye  . Vascectomy    . Total knee arthroplasty  02/08/2012    Procedure: TOTAL KNEE ARTHROPLASTY;  Surgeon: [name], MD;  Location: [facility];  Service:  Orthopedics;  Laterality: Left;  . Knee arthroscopy Left 02/28/2014    Procedure: ARTHROSCOPY LEFT KNEE WITH SYNOVECTOMY;  Surgeon: [name], MD;  Location: [facility];  Service: Orthopedics;  Laterality: Left;     Current Outpatient Prescriptions  Medication Sig Dispense Refill  . aspirin EC 81 MG tablet Take 81 mg by mouth at bedtime.    . calcium carbonate (OSCAL) 1500 (600 Ca) MG TABS tablet Take 1 tablet by mouth 2 (two) times daily with a meal.    . glipiZIDE (GLUCOTROL) 10 MG tablet Take 10 mg by mouth every evening.     Marland Kitchen lisinopril (PRINIVIL,ZESTRIL) 20 MG tablet Take 20 mg by mouth at bedtime.    . metFORMIN (GLUCOPHAGE) 1000 MG tablet Take 1,000 mg by mouth 2 (two) times daily with a meal.    . Multiple Vitamin (MULTIVITAMIN WITH MINERALS) TABS tablet Take 1 tablet by mouth daily.    . pioglitazone (ACTOS) 15 MG tablet Take 15 mg by mouth at bedtime.    . simvastatin (ZOCOR) 20 MG tablet Take 20 mg by mouth at bedtime.    Marland Kitchen terazosin (HYTRIN) 5 MG capsule Take 5 mg by mouth at bedtime.     No current facility-administered medications for this visit.    Allergies:   Codeine and Oxycodone  Social History:  The patient  reports that he has never smoked. He has never used smokeless tobacco. He reports that he drinks alcohol. He reports that he does not use illicit drugs.   Family History:  The patient's family history includes Heart attack in his brother; Heart disease in his brother and mother.    ROS:  Please see the history of present illness.   Otherwise, review of systems is positive for snoring, back pain, muscle pain, bruising.   All other systems are reviewed and negative.    PHYSICAL EXAM: VS:  BP 130/72 mmHg  Pulse 69  Ht  (1.778 m)  Wt 191 lb 12.8 oz (87 kg)  BMI 27.52 kg/m2 , BMI Body mass index is 27.52 kg/(m^2). GEN: Well nourished, well developed, in no acute distress HEENT: normal Neck: no JVD, carotid bruits, or masses Cardiac: irregular; no  murmurs, rubs, or gallops,no edema  Respiratory:  clear to auscultation bilaterally, normal work of breathing GI: soft, nontender, nondistended, + BS MS: no deformity or atrophy Skin: warm and dry Neuro:  Strength and sensation are intact Psych: euthymic mood, full affect  EKG:  EKG is ordered today. The ekg ordered today shows atrial fibrillatin, rate 69  Recent Labs: 02/03/2016: BUN 27*; Creat 1.36*; Potassium 4.8; Sodium 143; TSH 4.27    Lipid Panel  No results found for: CHOL, TRIG, HDL, CHOLHDL, VLDL, LDLCALC, LDLDIRECT   Wt Readings from Last 3 Encounters:  02/03/16 191 lb 12.8 oz (87 kg)  02/22/14 203 lb (92.08 kg)  07/25/12 200 lb (90.719 kg)      Other studies Reviewed: Additional studies/ records that were reviewed today include: 12/2010 MPI  Review of the above records today demonstrates:  ECG positive for ischemia, myoview with normal perfusion   ASSESSMENT AND PLAN:  1.  Atrial fibrillation: was noted to incidentally have atrial fibrillation. He is minimally some symptomatic with shortness of breath and fatigue. He has a CHADS2VASC score of 4 and therefore does require anticoagulation.I have discussed with him the options including Coumadin versus normal anticoagulants. He prefers a novel anticoagulant and we will start him on his relatively today. We will also get an echocardiogram to see what his LV function is and he has any further valvular abnormalities. We'll also get a TSH.  Due to his symptomatic atrial fibrillation, we will plan cardioversion 3 weeks after the initiation of his relative. I will see him back in 3 weeks.   Current medicines are reviewed at length with the patient today.   The patient does not have concerns regarding his medicines.  The following changes were made today:  Xarelto  Labs/ tests ordered today include:  Orders Placed This Encounter  Procedures  . TSH  . Basic metabolic panel  . EKG 12-Lead  . ECHOCARDIOGRAM COMPLETE      Disposition:   FU with Will Camnitz 3 months  Signed, Will Jorja Loa, MD  02/04/2016 7:45 AM     [facility] [address] [place] [address] [phone] (office) [phone] (fax)

## 2016-02-03 ENCOUNTER — Ambulatory Visit (INDEPENDENT_AMBULATORY_CARE_PROVIDER_SITE_OTHER): Payer: Medicare Other | Admitting: Cardiology

## 2016-02-03 ENCOUNTER — Encounter: Payer: Self-pay | Admitting: Cardiology

## 2016-02-03 VITALS — BP 130/72 | HR 69 | Ht 70.0 in | Wt 191.8 lb

## 2016-02-03 DIAGNOSIS — I4891 Unspecified atrial fibrillation: Secondary | ICD-10-CM | POA: Diagnosis not present

## 2016-02-03 LAB — BASIC METABOLIC PANEL
BUN: 27 mg/dL — AB (ref 7–25)
CHLORIDE: 104 mmol/L (ref 98–110)
CO2: 30 mmol/L (ref 20–31)
Calcium: 9.8 mg/dL (ref 8.6–10.3)
Creat: 1.36 mg/dL — ABNORMAL HIGH (ref 0.70–1.11)
Glucose, Bld: 83 mg/dL (ref 65–99)
Potassium: 4.8 mmol/L (ref 3.5–5.3)
Sodium: 143 mmol/L (ref 135–146)

## 2016-02-03 NOTE — Patient Instructions (Addendum)
Medication Instructions:  Your physician has recommended you make the following change in your medication:  1) START Xarelto - we will determine dosage once blood work has resulted.  Labwork: BMET & TSH  today  Testing/Procedures: Your physician has requested that you have an echocardiogram. Echocardiography is a painless test that uses sound waves to create images of your heart. It provides your doctor with information about the size and shape of your heart and how well your heart's chambers and valves are working. This procedure takes approximately one hour. There are no restrictions for this procedure.  Your physician has recommended that you have a Cardioversion (DCCV). Electrical Cardioversion uses a jolt of electricity to your heart either through paddles or wired patches attached to your chest. This is a controlled, usually prescheduled, procedure. Defibrillation is done under light anesthesia in the hospital, and you usually go home the day of the procedure. This is done to get your heart back into a normal rhythm. You are not awake for the procedure.   Sherri will call you to arrange this procedure.  You must taking Xarelto for 3 weeks before you can have this procedure done.  Follow-Up: Your physician recommends that you schedule a follow-up appointment in: 3 months with Dr. Elberta Fortis.  If you need a refill on your cardiac medications before your next appointment, please call your pharmacy.  Thank you for choosing [facility]!!   Dory Horn, RN [phone]  Electrical Cardioversion Electrical cardioversion is the delivery of a jolt of electricity to change the rhythm of the heart. Sticky patches or metal paddles are placed on the chest to deliver the electricity from a device. This is done to restore a normal rhythm. A rhythm that is too fast or not regular keeps the heart from pumping well. Electrical cardioversion is done in an emergency if:   There is low or no blood  pressure as a result of the heart rhythm.   Normal rhythm must be restored as fast as possible to protect the brain and heart from further damage.   It may save a life. Cardioversion may be done for heart rhythms that are not immediately life threatening, such as atrial fibrillation or flutter, in which:   The heart is beating too fast or is not regular.   Medicine to change the rhythm has not worked.   It is safe to wait in order to allow time for preparation.  Symptoms of the abnormal rhythm are bothersome.  The risk of stroke and other serious problems can be reduced. LET [facility] CARE PROVIDER KNOW ABOUT:   Any allergies you have.  All medicines you are taking, including vitamins, herbs, eye drops, creams, and over-the-counter medicines.  Previous problems you or members of your family have had with the use of anesthetics.   Any blood disorders you have.   Previous surgeries you have had.   Medical conditions you have. RISKS AND COMPLICATIONS  Generally, this is a safe procedure. However, problems can occur and include:   Breathing problems related to the anesthetic used.  A blood clot that breaks free and travels to other parts of your body. This could cause a stroke or other problems. The risk of this is lowered by use of blood-thinning medicine (anticoagulant) prior to the procedure.  Cardiac arrest (rare). BEFORE THE PROCEDURE   You may have tests to detect blood clots in your heart and to evaluate heart function.  You may start taking anticoagulants so your blood  does not clot as easily.   Medicines may be given to help stabilize your heart rate and rhythm. PROCEDURE  You will be given medicine through an IV tube to reduce discomfort and make you sleepy (sedative).   An electrical shock will be delivered. AFTER THE PROCEDURE Your heart rhythm will be watched to make sure it does not change.    This information is not intended to replace advice  given to you by your health care provider. Make sure you discuss any questions you have with your health care provider.   Document Released: 11/20/2002 Document Revised: 12/21/2014 Document Reviewed: 06/14/2013 Elsevier Interactive Patient Education 2016 Elsevier Inc.   Atrial Fibrillation Atrial fibrillation is a type of irregular or rapid heartbeat (arrhythmia). In atrial fibrillation, the heart quivers continuously in a chaotic pattern. This occurs when parts of the heart receive disorganized signals that make the heart unable to pump blood normally. This can increase the risk for stroke, heart failure, and other heart-related conditions. There are different types of atrial fibrillation, including:  Paroxysmal atrial fibrillation. This type starts suddenly, and it usually stops on its own shortly after it starts.  Persistent atrial fibrillation. This type often lasts longer than a week. It may stop on its own or with treatment.  Long-lasting persistent atrial fibrillation. This type lasts longer than 12 months.  Permanent atrial fibrillation. This type does not go away. Talk with your health care provider to learn about the type of atrial fibrillation that you have. CAUSES This condition is caused by some heart-related conditions or procedures, including:  A heart attack.  Coronary artery disease.  Heart failure.  Heart valve conditions.  High blood pressure.  Inflammation of the sac that surrounds the heart (pericarditis).  Heart surgery.  Certain heart rhythm disorders, such as Wolf-Parkinson-White syndrome. Other causes include:  Pneumonia.  Obstructive sleep apnea.  Blockage of an artery in the lungs (pulmonary embolism, or PE).  Lung cancer.  Chronic lung disease.  Thyroid problems, especially if the thyroid is overactive (hyperthyroidism).  Caffeine.  Excessive alcohol use or illegal drug use.  Use of some medicines, including certain decongestants and  diet pills. Sometimes, the cause cannot be found. RISK FACTORS This condition is more likely to develop in:  People who are older in age.  People who smoke.  People who have diabetes mellitus.  People who are overweight (obese).  Athletes who exercise vigorously. SYMPTOMS Symptoms of this condition include:  A feeling that your heart is beating rapidly or irregularly.  A feeling of discomfort or pain in your chest.  Shortness of breath.  Sudden light-headedness or weakness.  Getting tired easily during exercise. In some cases, there are no symptoms. DIAGNOSIS Your health care provider may be able to detect atrial fibrillation when taking your pulse. If detected, this condition may be diagnosed with:  An electrocardiogram (ECG).  A Holter monitor test that records your heartbeat patterns over a 24-hour period.  Transthoracic echocardiogram (TTE) to evaluate how blood flows through your heart.  Transesophageal echocardiogram (TEE) to view more detailed images of your heart.  A stress test.  Imaging tests, such as a CT scan or chest X-ray.  Blood tests. TREATMENT The main goals of treatment are to prevent blood clots from forming and to keep your heart beating at a normal rate and rhythm. The type of treatment that you receive depends on many factors, such as your underlying medical conditions and how you feel when you are experiencing atrial fibrillation.  This condition may be treated with:  Medicine to slow down the heart rate, bring the heart's rhythm back to normal, or prevent clots from forming.  Electrical cardioversion. This is a procedure that resets your heart's rhythm by delivering a controlled, low-energy shock to the heart through your skin.  Different types of ablation, such as catheter ablation, catheter ablation with pacemaker, or surgical ablation. These procedures destroy the heart tissues that send abnormal signals. When the pacemaker is used, it is  placed under your skin to help your heart beat in a regular rhythm. HOME CARE INSTRUCTIONS  Take over-the counter and prescription medicines only as told by your health care provider.  If your health care provider prescribed a blood-thinning medicine (anticoagulant), take it exactly as told. Taking too much blood-thinning medicine can cause bleeding. If you do not take enough blood-thinning medicine, you will not have the protection that you need against stroke and other problems.  Do not use tobacco products, including cigarettes, chewing tobacco, and e-cigarettes. If you need help quitting, ask your health care provider.  If you have obstructive sleep apnea, manage your condition as told by your health care provider.  Do not drink alcohol.  Do not drink beverages that contain caffeine, such as coffee, soda, and tea.  Maintain a healthy weight. Do not use diet pills unless your health care provider approves. Diet pills may make heart problems worse.  Follow diet instructions as told by your health care provider.  Exercise regularly as told by your health care provider.  Keep all follow-up visits as told by your health care provider. This is important. PREVENTION  Avoid drinking beverages that contain caffeine or alcohol.  Avoid certain medicines, especially medicines that are used for breathing problems.  Avoid certain herbs and herbal medicines, such as those that contain ephedra or ginseng.  Do not use illegal drugs, such as cocaine and amphetamines.  Do not smoke.  Manage your high blood pressure. SEEK MEDICAL CARE IF:  You notice a change in the rate, rhythm, or strength of your heartbeat.  You are taking an anticoagulant and you notice increased bruising.  You tire more easily when you exercise or exert yourself. SEEK IMMEDIATE MEDICAL CARE IF:  You have chest pain, abdominal pain, sweating, or weakness.  You feel nauseous.  You notice blood in your vomit, bowel  movement, or urine.  You have shortness of breath.  You suddenly have swollen feet and ankles.  You feel dizzy.  You have sudden weakness or numbness of the face, arm, or leg, especially on one side of the body.  You have trouble speaking, trouble understanding, or both (aphasia).  Your face or your eyelid droops on one side. These symptoms may represent a serious problem that is an emergency. Do not wait to see if the symptoms will go away. Get medical help right away. Call your local emergency services (911 in the [place] Do not drive yourself to the hospital.   This information is not intended to replace advice given to you by your health care provider. Make sure you discuss any questions you have with your health care provider.   Document Released: 11/30/2005 Document Revised: 08/21/2015 Document Reviewed: 03/27/2015 Elsevier Interactive Patient Education [employer]

## 2016-02-04 ENCOUNTER — Other Ambulatory Visit: Payer: Self-pay | Admitting: *Deleted

## 2016-02-04 LAB — TSH: TSH: 4.27 mIU/L (ref 0.40–4.50)

## 2016-02-04 MED ORDER — RIVAROXABAN 15 MG PO TABS: 15.0000 mg | ORAL_TABLET | Freq: Every day | ORAL | Status: DC

## 2016-02-11 ENCOUNTER — Ambulatory Visit
Admission: RE | Admit: 2016-02-11 | Discharge: 2016-02-11 | Disposition: A | Discharge status: Home / Self Care | Payer: Medicare Other | Source: Ambulatory Visit | Attending: Physical Medicine and Rehabilitation | Admitting: Physical Medicine and Rehabilitation

## 2016-02-11 DIAGNOSIS — S32010S Wedge compression fracture of first lumbar vertebra, sequela: Secondary | ICD-10-CM | POA: Diagnosis not present

## 2016-02-11 DIAGNOSIS — S32000G Wedge compression fracture of unspecified lumbar vertebra, subsequent encounter for fracture with delayed healing: Secondary | ICD-10-CM

## 2016-02-11 NOTE — Consult Note (Signed)
Chief Complaint: Patient was seen in follow up today for L1 compression fracture treated with vertebroplasty 01/23/16.   Referring Physician(s): Clarene Duke, Rosanne Ashing  History of Present Illness: Daniel Cooke is a 82 y.o. male who reports significant improvement in his low back pain following vertebroplasty 01/23/16. His pain is now more fluctuant. He describes pain across is low back but not into his hips. He is able perform most activities of daily living. His pain is fairly low today, but he did have more pain yesterday.  His score is 18/24 on the [employer] disability questionnaire. He did not assign a score on the visual analog pain scale.    Past Medical History  Diagnosis Date  . Arthritis     both knees -left  pain is worse  . Shingles     "long time ago"  . Diabetes mellitus     oral meds - no insulin  . Hypercholesterolemia   . BPH (benign prostatic hypertrophy)   . Hard of hearing     wears bilateral hearing aids  . Pilonidal cyst     PAST HX - NO PROBLEM NOW    Past Surgical History  Procedure Laterality Date  . Cholecystectomy    . Left rotator cuff repair    . Appendectomy    . Tonsillectomy    . Right knee arthroscopy    . Eye surgery      left traumatic cataract removed--injury to the eye--states his eyesight is ok in left eye  . Vascectomy    . Total knee arthroplasty  02/08/2012    Procedure: TOTAL KNEE ARTHROPLASTY;  Surgeon: [name], MD;  Location: [facility];  Service: Orthopedics;  Laterality: Left;  . Knee arthroscopy Left 02/28/2014    Procedure: ARTHROSCOPY LEFT KNEE WITH SYNOVECTOMY;  Surgeon: [name], MD;  Location: [facility];  Service: Orthopedics;  Laterality: Left;    Allergies: Codeine and Oxycodone  Medications: Prior to Admission medications   Medication Sig Start Date End Date Taking? Authorizing Provider  calcium carbonate (OSCAL) 1500 (600 Ca) MG TABS tablet Take 1 tablet by mouth 2 (two) times daily with a  meal.    Historical Provider, MD  glipiZIDE (GLUCOTROL) 10 MG tablet Take 10 mg by mouth every evening.     Historical Provider, MD  lisinopril (PRINIVIL,ZESTRIL) 20 MG tablet Take 20 mg by mouth at bedtime.    Historical Provider, MD  metFORMIN (GLUCOPHAGE) 1000 MG tablet Take 1,000 mg by mouth 2 (two) times daily with a meal.    Historical Provider, MD  Multiple Vitamin (MULTIVITAMIN WITH MINERALS) TABS tablet Take 1 tablet by mouth daily.    Historical Provider, MD  pioglitazone (ACTOS) 15 MG tablet Take 15 mg by mouth at bedtime.    Historical Provider, MD  Rivaroxaban (XARELTO) 15 MG TABS tablet Take 1 tablet (15 mg total) by mouth daily with supper. 02/04/16   Will Jorja Loa, MD  Rivaroxaban (XARELTO) 15 MG TABS tablet Take 1 tablet (15 mg total) by mouth daily with supper. 02/04/16   Will Jorja Loa, MD  simvastatin (ZOCOR) 20 MG tablet Take 20 mg by mouth at bedtime.    Historical Provider, MD  terazosin (HYTRIN) 5 MG capsule Take 5 mg by mouth at bedtime.    Historical Provider, MD     Family History  Problem Relation Age of Onset  . Heart disease Mother   . Heart disease Brother   . Heart attack Brother  Social History   Social History  . Marital Status: Married    Spouse Name: N/A  . Number of Children: N/A  . Years of Education: N/A   Social History Main Topics  . Smoking status: Never Smoker   . Smokeless tobacco: Never Used  . Alcohol Use: Yes     Comment: occasionally  . Drug Use: No  . Sexual Activity: Not on file   Other Topics Concern  . Not on file   Social History Narrative    ECOG Status: 1 - Symptomatic but completely ambulatory  Review of Systems: A 12 point ROS discussed and pertinent positives are indicated in the HPI above.  All other systems are negative.  Review of Systems  Vital Signs: BP 135/64 mmHg  Pulse 78  Temp(Src) 97.6 F (36.4 C) (Oral)  SpO2 95%  Physical Exam  He does not have pain with palpation at the  thorocolumbar junction.  He is mildly tender to palpation over the lower lumbar spine.  His gait is unremarkable.  He is able to easily get up from a chair and ambulate without assistance.    Mallampati Score:     Imaging: Dg Epidural Veno/verte Bropl  01/30/2016  ADDENDUM REPORT: 01/30/2016 10:43 ADDENDUM: The exam should read:  L1 vertebroplasty. Electronically Signed   By: Marin Roberts M.D.   On: 01/30/2016 10:43  01/30/2016  INDICATION: Symptomatic nonhealing osteoporotic compression fracture after a fall 6 weeks ago. EXAM: EPIDURAL VENOGRAPHY MEDICATIONS: As antibiotic prophylaxis, Ancef 2 g was ordered pre-procedure and administered intravenously within 1 hour of incision. ANESTHESIA/SEDATION: Moderate (conscious) sedation was employed during this procedure. A total of Versed 1 mg and Fentanyl 50 mcg was administered intravenously. Moderate Sedation Time: 34 minutes. The patient's level of consciousness and vital signs were monitored continuously by radiology nursing throughout the procedure under my direct supervision. FLUOROSCOPY TIME:  Fluoroscopy Time: 4 minutes minutes 35 seconds COMPLICATIONS: None immediate. TECHNIQUE: Informed written consent was obtained from the patient after a thorough discussion of the procedural risks, benefits and alternatives. All questions were addressed. Maximal Sterile Barrier Technique was utilized including caps, mask, sterile gowns, sterile gloves, sterile drape, hand hygiene and skin antiseptic. A timeout was performed prior to the initiation of the procedure. PROCEDURE: The patient was placed prone on the fluoroscopic table. Nasal oxygen was administered. Physiologic monitoring was performed throughout the duration of the procedure. The skin overlying the upper lumbar region was prepped and draped in the usual sterile fashion. The L1 vertebral body was identified and the right pedicle was infiltrated with 2% lidocaine. This was then followed by the  advancement of a 13-gauge Cook needle through the right pedicle into the anterior one-third at L1. At this time, methylmethacrylate mixture was reconstituted. Under intermittent fluoroscopy, the methylmethacrylate was then injected into the L1 vertebral body with filling of the vertebral body and superior endplate fissure. No extravasation was noted into the disk spaces or posteriorly into the spinal canal. No epidural venous contamination was seen. The needle was then removed. Hemostasis was achieved at the skin entry site. There were no acute complications. Patient tolerated the procedure well. The patient was observed for 1.5 hours and discharged in good condition. IMPRESSION: 1. Status post vertebral body augmentation for painful compression fracture at L1 using vertebroplasty technique. Electronically Signed: By: Marin Roberts M.D. On: 01/23/2016 14:54    Labs:  CBC: No results for input(s): WBC, HGB, HCT, PLT in the last 8760 hours.  COAGS: No results for  input(s): INR, APTT in the last 8760 hours.  BMP:  Recent Labs  02/03/16 1504  NA 143  K 4.8  CL 104  CO2 30  GLUCOSE 83  BUN 27*  CALCIUM 9.8  CREATININE 1.36*    LIVER FUNCTION TESTS: No results for input(s): BILITOT, AST, ALT, ALKPHOS, PROT, ALBUMIN in the last 8760 hours.  TUMOR MARKERS: No results for input(s): AFPTM, CEA, CA199, CHROMGRNA in the last 8760 hours.  Assessment and Plan:  1.  Healed L1 Comrpession fracture following vertebroplasty 01/23/16.  2.  Possible osteoporosis.  Recommend evaluation of bone density and treatment as appropriate.  3.  Atrial Fibrillation - Evaluated and followed by Dr. Loman Brooklyn, Cardiology  I greatly enjoyed working  [employer].  A copy of this report was sent to the requesting provider on this date.  Electronically Signed: Hamdi Kley W 02/11/2016, 10:08 AM   I spent a total of 10 Minutes in face to face in clinical consultation, greater than 50% of  which was counseling/coordinating care for Mr. Creason

## 2016-02-13 ENCOUNTER — Ambulatory Visit (HOSPITAL_COMMUNITY): Payer: Medicare Other | Attending: Cardiology

## 2016-02-13 ENCOUNTER — Other Ambulatory Visit: Payer: Self-pay

## 2016-02-13 DIAGNOSIS — E119 Type 2 diabetes mellitus without complications: Secondary | ICD-10-CM | POA: Diagnosis not present

## 2016-02-13 DIAGNOSIS — I351 Nonrheumatic aortic (valve) insufficiency: Secondary | ICD-10-CM | POA: Insufficient documentation

## 2016-02-13 DIAGNOSIS — M545 Low back pain: Secondary | ICD-10-CM | POA: Diagnosis not present

## 2016-02-13 DIAGNOSIS — I119 Hypertensive heart disease without heart failure: Secondary | ICD-10-CM | POA: Insufficient documentation

## 2016-02-13 DIAGNOSIS — I4891 Unspecified atrial fibrillation: Secondary | ICD-10-CM | POA: Insufficient documentation

## 2016-02-13 DIAGNOSIS — I340 Nonrheumatic mitral (valve) insufficiency: Secondary | ICD-10-CM | POA: Diagnosis not present

## 2016-02-13 DIAGNOSIS — M4856XD Collapsed vertebra, not elsewhere classified, lumbar region, subsequent encounter for fracture with routine healing: Secondary | ICD-10-CM | POA: Diagnosis not present

## 2016-02-13 DIAGNOSIS — M47817 Spondylosis without myelopathy or radiculopathy, lumbosacral region: Secondary | ICD-10-CM | POA: Diagnosis not present

## 2016-02-20 ENCOUNTER — Other Ambulatory Visit: Payer: Self-pay | Admitting: *Deleted

## 2016-02-20 ENCOUNTER — Telehealth: Payer: Self-pay | Admitting: *Deleted

## 2016-02-20 ENCOUNTER — Encounter: Payer: Self-pay | Admitting: *Deleted

## 2016-02-20 MED ORDER — CARVEDILOL 12.5 MG PO TABS: 12.5000 mg | ORAL_TABLET | Freq: Two times a day (BID) | ORAL | Status: DC

## 2016-02-20 NOTE — Telephone Encounter (Signed)
Called patient to schedule DCCV. Patient tells me that he is and has been feeling much better.  Denies any fatigue/SOB (mild symptoms he voiced at 2/20 OV). He tells me that he actually worked last weekend. States he does not feel that he needs DCCV at this time. Patient aware I will review update w/ Dr. Elberta Fortisamnitz and call him back with recommendations.

## 2016-02-20 NOTE — Telephone Encounter (Signed)
Attempted to reach pt's dtr.  Phone number is changed/disconnected/or no longer in service. Called pt who confirmed that number is correct for his dtr and to try calling her tomorrow, states she is going to pay her bill.

## 2016-02-20 NOTE — Telephone Encounter (Addendum)
error 

## 2016-02-20 NOTE — Telephone Encounter (Signed)
Please call his daughter,she wants to know about his Xarelto.

## 2016-02-20 NOTE — Telephone Encounter (Signed)
This encounter was created in error - please disregard.

## 2016-02-26 NOTE — Telephone Encounter (Signed)
Check in with patient who states he is still feeling ok and would like to hold off on DCCV at this time. Denies SOB/fatigue/palpitations or other symptoms. Advised to call office if he develops symptoms and we will discuss DCCV, if needed, at that time. Patient verbalized understanding and agreeable to plan.   Left another message for dtr to call me back to discuss her call about Xarelto.

## 2016-03-10 ENCOUNTER — Telehealth: Payer: Self-pay | Admitting: Cardiology

## 2016-03-10 DIAGNOSIS — M25561 Pain in right knee: Secondary | ICD-10-CM | POA: Diagnosis not present

## 2016-03-10 DIAGNOSIS — Z471 Aftercare following joint replacement surgery: Secondary | ICD-10-CM | POA: Diagnosis not present

## 2016-03-10 DIAGNOSIS — Z96652 Presence of left artificial knee joint: Secondary | ICD-10-CM | POA: Diagnosis not present

## 2016-03-10 DIAGNOSIS — R400 Somnolence: Secondary | ICD-10-CM

## 2016-03-10 DIAGNOSIS — M1711 Unilateral primary osteoarthritis, right knee: Secondary | ICD-10-CM | POA: Diagnosis not present

## 2016-03-10 DIAGNOSIS — R0683 Snoring: Secondary | ICD-10-CM

## 2016-03-10 NOTE — Telephone Encounter (Signed)
Spoke with wife (ok per pt). He cannot afford Xarelto and they would like to switch to Coumadin. She states she has been taking for years and done fine and he would like to switch. She is aware I will her her tomorrow after reviewing request w/ Dr. Elberta Fortisamnitz. She was very thankful for calling her back and staying late to take care of patients.

## 2016-03-10 NOTE — Telephone Encounter (Signed)
New Message:  Pt's wife is calling back to speak with Roanna RaiderSherri because the husband did not understand the instructions she gave him after the he had his Echo. Please f/u with the wife

## 2016-03-11 NOTE — Telephone Encounter (Signed)
Spoke to patient and discussed sleep study.  He is aware office will call to arrange this testing. (I will finish arranging when I am back in the office on Friday)  Informed ok to switch to Coumadin, per Dr. Elberta Fortisamnitz. Spoke with his wife and explained that I will discuss this with coumadin clinic on Friday and we would contact them to arrange. She thanks me for "saving their life" since other medication is not affordable for them.

## 2016-03-13 ENCOUNTER — Telehealth: Payer: Self-pay | Admitting: *Deleted

## 2016-03-13 NOTE — Telephone Encounter (Signed)
Sleep study order placed in [facility]. [facility]

## 2016-03-13 NOTE — Telephone Encounter (Signed)
-----   Message from Baird LyonsSherri L Price, RN sent at 03/13/2016  9:24 AM EDT ----- Regarding: Switch to Coumadin Patient would like to switch to Coumadin secondary to cost of Xarelto.  Pt's wife comes here for her coumadin checks and they would like to come together once he is established here.   thx :)

## 2016-03-13 NOTE — Telephone Encounter (Signed)
Pt will overlap Coumadin 5mg s with Xarelto for 3 days starting today, then discontinue Xarelto and continue coumadin 5mg  QD until follow up on Tuesday.

## 2016-03-16 ENCOUNTER — Telehealth: Payer: Self-pay | Admitting: Cardiology

## 2016-03-16 ENCOUNTER — Other Ambulatory Visit: Payer: Self-pay | Admitting: *Deleted

## 2016-03-16 MED ORDER — WARFARIN SODIUM 5 MG PO TABS: 5.0000 mg | ORAL_TABLET | Freq: Every day | ORAL | Status: DC

## 2016-03-16 NOTE — Telephone Encounter (Signed)
New Message  Pt calling to speak w/ Coum- about starting coumadin- Please call back today and discuss.

## 2016-03-16 NOTE — Telephone Encounter (Signed)
Spoke with pt & sent in prescription for Coumadin to requested Pharmacy.  Instructed pt to overlap Xarelto & Coumadin for the next 3 days then take Coumadin only. Appt made for new CVRR follow-up on Friday, April 7th.

## 2016-03-20 ENCOUNTER — Ambulatory Visit (INDEPENDENT_AMBULATORY_CARE_PROVIDER_SITE_OTHER): Payer: Medicare Other | Admitting: Pharmacist

## 2016-03-20 DIAGNOSIS — Z5181 Encounter for therapeutic drug level monitoring: Secondary | ICD-10-CM | POA: Diagnosis not present

## 2016-03-20 DIAGNOSIS — I4891 Unspecified atrial fibrillation: Secondary | ICD-10-CM

## 2016-03-20 DIAGNOSIS — I482 Chronic atrial fibrillation, unspecified: Secondary | ICD-10-CM | POA: Insufficient documentation

## 2016-03-20 LAB — POCT INR: INR: 1.5

## 2016-03-27 ENCOUNTER — Ambulatory Visit (INDEPENDENT_AMBULATORY_CARE_PROVIDER_SITE_OTHER): Payer: Medicare Other | Admitting: Pharmacist

## 2016-03-27 DIAGNOSIS — Z5181 Encounter for therapeutic drug level monitoring: Secondary | ICD-10-CM

## 2016-03-27 DIAGNOSIS — I4891 Unspecified atrial fibrillation: Secondary | ICD-10-CM | POA: Diagnosis not present

## 2016-03-27 LAB — POCT INR: INR: 2.4

## 2016-04-03 ENCOUNTER — Ambulatory Visit (INDEPENDENT_AMBULATORY_CARE_PROVIDER_SITE_OTHER): Payer: Medicare Other | Admitting: Surgery

## 2016-04-03 DIAGNOSIS — I4891 Unspecified atrial fibrillation: Secondary | ICD-10-CM | POA: Diagnosis not present

## 2016-04-03 DIAGNOSIS — Z5181 Encounter for therapeutic drug level monitoring: Secondary | ICD-10-CM

## 2016-04-03 LAB — POCT INR: INR: 2.1

## 2016-04-13 ENCOUNTER — Ambulatory Visit (INDEPENDENT_AMBULATORY_CARE_PROVIDER_SITE_OTHER): Payer: Medicare Other | Admitting: Pharmacist

## 2016-04-13 DIAGNOSIS — Z5181 Encounter for therapeutic drug level monitoring: Secondary | ICD-10-CM | POA: Diagnosis not present

## 2016-04-13 DIAGNOSIS — I4891 Unspecified atrial fibrillation: Secondary | ICD-10-CM

## 2016-04-13 LAB — POCT INR: INR: 2.1

## 2016-04-24 ENCOUNTER — Other Ambulatory Visit: Payer: Self-pay | Admitting: Physical Medicine and Rehabilitation

## 2016-04-24 DIAGNOSIS — IMO0002 Reserved for concepts with insufficient information to code with codable children: Secondary | ICD-10-CM

## 2016-04-29 ENCOUNTER — Ambulatory Visit (INDEPENDENT_AMBULATORY_CARE_PROVIDER_SITE_OTHER): Payer: Medicare Other | Admitting: *Deleted

## 2016-04-29 DIAGNOSIS — Z5181 Encounter for therapeutic drug level monitoring: Secondary | ICD-10-CM

## 2016-04-29 DIAGNOSIS — I4891 Unspecified atrial fibrillation: Secondary | ICD-10-CM

## 2016-04-29 LAB — POCT INR: INR: 2.8

## 2016-05-04 NOTE — Progress Notes (Signed)
Electrophysiology Office Note   Date:  05/05/2016   ID:  Daniel Sabinarnest P Dobie, DOB 01-Oct-1924, MRN [id number]  PCP:  Hollice EspyGATES,DONNA RUTH, MD  Electrophysiologist:  Regan LemmingWill Martin Camnitz, MD    Chief Complaint  Patient presents with  . Follow-up    3 months  . Atrial Fibrillation     History of Present Illness: Daniel Cooke is a 82 y.o. male who presents today for electrophysiology evaluation.   He has a history of hypertension, diabetes, and hyperlipidemia. He was found to be in atrial fibrillation after a fall where he suffered a vertebral fracture.  Had planned cardioversion but was feeling well and thus canceled.  He is currently feeling well without any major complaints. He says that he is continuing to have back pain and is seeing his surgeon soon.  Today, he denies symptoms of palpitations, chest pain, shortness of breath, orthopnea, PND, lower extremity edema, claudication, dizziness, presyncope, syncope, bleeding, or neurologic sequela. The patient is tolerating medications without difficulties and is otherwise without complaint today.   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  Past Medical History  Diagnosis Date  . Arthritis     both knees -left  pain is worse  . Shingles     "long time ago"  . Diabetes mellitus     oral meds - no insulin  . Hypercholesterolemia   . BPH (benign prostatic hypertrophy)   . Hard of hearing     wears bilateral hearing aids  . Pilonidal cyst     PAST HX - NO PROBLEM NOW   Past Surgical History  Procedure Laterality Date  . Cholecystectomy    . Left rotator cuff repair    . Appendectomy    . Tonsillectomy    . Right knee arthroscopy    . Eye surgery      left traumatic cataract removed--injury to the eye--states his eyesight is ok in left eye  . Vascectomy    . Total knee arthroplasty  02/08/2012    Procedure: TOTAL KNEE ARTHROPLASTY;  Surgeon: Loanne DrillingFrank V Aluisio, MD;  Location: [facility];  Service: Orthopedics;  Laterality: Left;  . Knee arthroscopy Left 02/28/2014    Procedure: ARTHROSCOPY LEFT KNEE WITH SYNOVECTOMY;  Surgeon: Loanne DrillingFrank V Aluisio, MD;  Location: [facility];  Service: Orthopedics;  Laterality: Left;     Current Outpatient Prescriptions  Medication Sig Dispense Refill  . calcium carbonate (OSCAL) 1500 (600 Ca) MG TABS tablet Take 1 tablet by mouth 2 (two) times daily with a meal.    . glipiZIDE (GLUCOTROL) 10 MG tablet Take 10 mg by mouth every evening.     Marland Kitchen. lisinopril (PRINIVIL,ZESTRIL) 20 MG tablet Take 20 mg by mouth at bedtime.    . metFORMIN (GLUCOPHAGE) 1000 MG tablet Take 1,000 mg by mouth 2 (two) times daily with a meal.    . Multiple Vitamin (MULTIVITAMIN WITH MINERALS) TABS tablet Take 1 tablet by mouth daily.    . pioglitazone (ACTOS) 15 MG tablet Take 15 mg by mouth at bedtime.    . simvastatin (ZOCOR) 20 MG tablet Take 20 mg by mouth at bedtime.    Marland Kitchen. terazosin (HYTRIN) 5 MG capsule Take 5 mg by mouth at bedtime.    Marland Kitchen. warfarin (COUMADIN) 5 MG tablet Take 1 tablet (5 mg total) by mouth daily. 30 tablet 1   No current facility-administered medications for this visit.  Allergies:   Codeine and Oxycodone   Social History:  The patient  reports that he has never smoked. He has never used smokeless tobacco. He reports that he drinks alcohol. He reports that he does not use illicit drugs.   Family History:  The patient's family history includes Heart attack in his brother; Heart disease in his brother and mother.    ROS:  Please see the history of present illness.   Otherwise, review of systems is positive for hearing loss.   All other systems are reviewed and negative.    PHYSICAL EXAM: VS:  BP 120/72 mmHg  Pulse 58  Ht 5' 10.5" (1.791 m)  Wt 191 lb (86.637 kg)  BMI 27.01 kg/m2 , BMI Body mass index is 27.01 kg/(m^2). GEN: Well nourished, well developed, in no acute distress HEENT: normal Neck: no JVD, carotid bruits, or masses Cardiac:  RRR; no murmurs, rubs, or gallops,no edema  Respiratory:  clear to auscultation bilaterally, normal work of breathing GI: soft, nontender, nondistended, + BS MS: no deformity or atrophy Skin: warm and dry Neuro:  Strength and sensation are intact Psych: euthymic mood, full affect  EKG:  EKG is ordered today. The ekg ordered today shows atrial fibrillatin, rate 69  Recent Labs: 02/03/2016: BUN 27*; Creat 1.36*; Potassium 4.8; Sodium 143; TSH 4.27    Lipid Panel  No results found for: CHOL, TRIG, HDL, CHOLHDL, VLDL, LDLCALC, LDLDIRECT   Wt Readings from Last 3 Encounters:  05/05/16 191 lb (86.637 kg)  02/03/16 191 lb 12.8 oz (87 kg)  02/22/14 203 lb (92.08 kg)      Other studies Reviewed: Additional studies/ records that were reviewed today include: 12/2010 MPI  Review of the above records today demonstrates:  ECG positive for ischemia, myoview with normal perfusion  02/13/16 TTE - Left ventricle: The cavity size was mildly dilated. Systolic  function was normal. The estimated ejection fraction was in the  range of 60% to 65%. Wall motion was normal; there were no  regional wall motion abnormalities. - Aortic valve: There was trivial regurgitation. - Mitral valve: There was mild regurgitation. - Left atrium: The atrium was mildly to moderately dilated. - Right ventricle: The cavity size was mildly dilated. Wall  thickness was normal. - Right atrium: The atrium was moderately dilated. - Pulmonary arteries: Systolic pressure was moderately increased.  PA peak pressure: 71 mm Hg (S).  ASSESSMENT AND PLAN:  1.  Atrial fibrillation: was noted to incidentally have atrial fibrillation. He is minimally some symptomatic with shortness of breath and fatigue. He has a CHADS2VASC score of 4 and therefore does require anticoagulation.  On warfarin for anticoagulation.  Was planned for DCCV but canceled as he was feeling well.On auscultation, his heart is regular and likely is back in  sinus rhythm. No changes made today.   Current medicines are reviewed at length with the patient today.   The patient does not have concerns regarding his medicines.  The following changes were made today:  none  Labs/ tests ordered today include:  No orders of the defined types were placed in this encounter.     Disposition:   FU with Will Camnitz 6  months  Signed, Will Jorja Loa, MD  05/05/2016 9:46 AM     [facility] [address] [place] [address] [phone] (office) [phone] (fax)

## 2016-05-05 ENCOUNTER — Other Ambulatory Visit: Payer: Self-pay | Admitting: *Deleted

## 2016-05-05 ENCOUNTER — Encounter: Payer: Self-pay | Admitting: Cardiology

## 2016-05-05 ENCOUNTER — Telehealth: Payer: Self-pay | Admitting: Cardiology

## 2016-05-05 ENCOUNTER — Ambulatory Visit (INDEPENDENT_AMBULATORY_CARE_PROVIDER_SITE_OTHER): Payer: Medicare Other | Admitting: Cardiology

## 2016-05-05 VITALS — BP 120/72 | HR 58 | Ht 70.5 in | Wt 191.0 lb

## 2016-05-05 DIAGNOSIS — I480 Paroxysmal atrial fibrillation: Secondary | ICD-10-CM

## 2016-05-05 MED ORDER — WARFARIN SODIUM 5 MG PO TABS: ORAL_TABLET | ORAL | Status: DC

## 2016-05-05 NOTE — Telephone Encounter (Signed)
Routing to [place] to call patient to address upcoming sleep study scheduled for 6/1

## 2016-05-05 NOTE — Telephone Encounter (Signed)
New Message:   Pt saw Dr Elberta Fortisamnitz this morning,at one time he had said something about a sleep study. Does he still want him to have one?

## 2016-05-05 NOTE — Patient Instructions (Signed)
Medication Instructions:    Your physician recommends that you continue on your current medications as directed. Please refer to the Current Medication list given to you today.  Labwork:  None ordered  Testing/Procedures:  None ordered  Follow-Up:  Your physician wants you to follow-up in: 6 months with Dr. Camnitz.  You will receive a reminder letter in the mail two months in advance. If you don't receive a letter, please call our office to schedule the follow-up appointment.  - If you need a refill on your cardiac medications before your next appointment, please call your pharmacy.    Thank you for choosing [facility]!!   Leron Stoffers, RN [phone]         

## 2016-05-05 NOTE — Telephone Encounter (Signed)
Attempted to call multiple times, sounded as though  Someone would answer but then never say anything.  Will try again.

## 2016-05-05 NOTE — Telephone Encounter (Signed)
Refill done as requested 

## 2016-05-06 ENCOUNTER — Telehealth: Payer: Self-pay | Admitting: Cardiology

## 2016-05-06 NOTE — Telephone Encounter (Signed)
F/u  Pt returning Call- Please call back and discuss.

## 2016-05-07 ENCOUNTER — Ambulatory Visit
Admission: RE | Admit: 2016-05-07 | Discharge: 2016-05-07 | Disposition: A | Discharge status: Home / Self Care | Payer: Medicare Other | Source: Ambulatory Visit | Attending: Physical Medicine and Rehabilitation | Admitting: Physical Medicine and Rehabilitation

## 2016-05-07 DIAGNOSIS — IMO0002 Reserved for concepts with insufficient information to code with codable children: Secondary | ICD-10-CM

## 2016-05-07 NOTE — Telephone Encounter (Signed)
See previous phone note.  

## 2016-05-07 NOTE — Telephone Encounter (Signed)
Per Dr. Elberta Fortisamnitz, it is okay to cancel sleep study.  I have called patients daughter to let her know, and I will call the sleep lab for her and cancel that appointment.  DPR form has been faxed to daughter for them to fill out.  Until we receive that, I have made a comment in the chart that he gave verbal permission to disclose information to his daughter. Daughter was very appreciative for the assistance.

## 2016-05-07 NOTE — Telephone Encounter (Signed)
Spoke with patient's daughter and she says that her father really does not want to have the sleep study.  I spoke with Dr. Elberta Fortisamnitz nurse, and she has sent him a message to see if it is okay to cancel this study.   I have spoken with the patient, and he gives verbal approval for us to speak with his daughter Nicola PoliceGayle Myrick concerning his health/results.    I called Inocencio HomesGayle and let her know that I will be sending them a DPR to fill out, and she stated verbal understanding.  I let her know that I would enter her contact information and that I would call her as soon as we hear back from Dr. Elberta Fortisamnitz.

## 2016-05-08 ENCOUNTER — Other Ambulatory Visit: Payer: Self-pay | Admitting: Physical Medicine and Rehabilitation

## 2016-05-08 DIAGNOSIS — M545 Low back pain: Secondary | ICD-10-CM

## 2016-05-14 ENCOUNTER — Encounter (HOSPITAL_BASED_OUTPATIENT_CLINIC_OR_DEPARTMENT_OTHER): Payer: Medicare Other

## 2016-05-18 ENCOUNTER — Other Ambulatory Visit: Payer: Medicare Other

## 2016-05-19 ENCOUNTER — Ambulatory Visit
Admission: RE | Admit: 2016-05-19 | Discharge: 2016-05-19 | Disposition: A | Discharge status: Home / Self Care | Payer: Medicare Other | Source: Ambulatory Visit | Attending: Physical Medicine and Rehabilitation | Admitting: Physical Medicine and Rehabilitation

## 2016-05-19 DIAGNOSIS — M5126 Other intervertebral disc displacement, lumbar region: Secondary | ICD-10-CM | POA: Diagnosis not present

## 2016-05-19 DIAGNOSIS — M545 Low back pain: Secondary | ICD-10-CM

## 2016-05-20 ENCOUNTER — Ambulatory Visit (INDEPENDENT_AMBULATORY_CARE_PROVIDER_SITE_OTHER): Payer: Medicare Other | Admitting: Pharmacist

## 2016-05-20 DIAGNOSIS — I4891 Unspecified atrial fibrillation: Secondary | ICD-10-CM | POA: Diagnosis not present

## 2016-05-20 DIAGNOSIS — Z5181 Encounter for therapeutic drug level monitoring: Secondary | ICD-10-CM

## 2016-05-20 LAB — POCT INR: INR: 2.8

## 2016-05-22 NOTE — Progress Notes (Signed)
Chief Complaint: Patient was seen in follow-up today for persistent low back and radicular pain despite L1 Vertebrolplasty  Referring Physician(s): Ibazebo,[place]  Patient Status: In-pt     History of Present Illness: Daniel Cooke is a 82 y.o. male who underwent L1 vertebroplasty 01/21/16.  He had some relief of his low back pain, but continues to have radicular symptoms, right greater than left.  He is able to drive and perform most activities of daily living.  A repeat Roland-Morris disability questionnaire revealed a score of 16/24.    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    Past Medical History  Diagnosis Date  . Arthritis     both knees -left  pain is worse  . Shingles     "long time ago"  . Diabetes mellitus     oral meds - no insulin  . Hypercholesterolemia   . BPH (benign prostatic hypertrophy)   . Hard of hearing     wears bilateral hearing aids  . Pilonidal cyst     PAST HX - NO PROBLEM NOW    Past Surgical History  Procedure Laterality Date  . Cholecystectomy    . Left rotator cuff repair    . Appendectomy    . Tonsillectomy    . Right knee arthroscopy    . Eye surgery      left traumatic cataract removed--injury to the eye--states his eyesight is ok in left eye  . Vascectomy    . Total knee arthroplasty  02/08/2012    Procedure: TOTAL KNEE ARTHROPLASTY;  Surgeon: [name], MD;  Location: [facility];  Service: Orthopedics;  Laterality: Left;  . Knee arthroscopy Left 02/28/2014    Procedure: ARTHROSCOPY LEFT KNEE WITH SYNOVECTOMY;  Surgeon: [name], MD;  Location: [facility];  Service: Orthopedics;  Laterality: Left;    Allergies: Codeine and Oxycodone  Medications: Prior to Admission medications   Medication Sig Start Date End Date Taking? Authorizing Provider  calcium carbonate (OSCAL) 1500 (600 Ca) MG TABS tablet Take 1 tablet by mouth 2 (two) times daily with a meal.    Historical  Provider, MD  glipiZIDE (GLUCOTROL) 10 MG tablet Take 10 mg by mouth every evening.     Historical Provider, MD  lisinopril (PRINIVIL,ZESTRIL) 20 MG tablet Take 20 mg by mouth at bedtime.    Historical Provider, MD  metFORMIN (GLUCOPHAGE) 1000 MG tablet Take 1,000 mg by mouth 2 (two) times daily with a meal.    Historical Provider, MD  Multiple Vitamin (MULTIVITAMIN WITH MINERALS) TABS tablet Take 1 tablet by mouth daily.    Historical Provider, MD  pioglitazone (ACTOS) 15 MG tablet Take 15 mg by mouth at bedtime.    Historical Provider, MD  simvastatin (ZOCOR) 20 MG tablet Take 20 mg by mouth at bedtime.    Historical Provider, MD  terazosin (HYTRIN) 5 MG capsule Take 5 mg by mouth at bedtime.    Historical Provider, MD  warfarin (COUMADIN) 5 MG tablet Take as directed by coumadin clinic 05/05/16   Will Jorja Loa, MD     Family History  Problem Relation Age of Onset  . Heart disease Mother   . Heart disease Brother   . Heart attack Brother     Social History   Social History  . Marital Status: Married    Spouse Name: N/A  . Number of Children: N/A  . Years of Education: N/A  Social History Main Topics  . Smoking status: Never Smoker   . Smokeless tobacco: Never Used  . Alcohol Use: Yes     Comment: occasionally  . Drug Use: No  . Sexual Activity: Not Asked   Other Topics Concern  . None   Social History Narrative     Review of Systems: A 12 point ROS discussed and pertinent positives are indicated in the HPI above.  All other systems are negative.  Review of Systems  Vital Signs: BP 137/65 mmHg  Pulse 61  Temp(Src) 97.5 F (36.4 C)  Resp 20  SpO2 97%  Physical Exam   The wound site has healed well.  He has no tenderness to palpation at the L1 level.  He does have some tenderness in the lower lumbar spine with palpation.     Assessment and Plan:  1.  Persistent radicular symptoms and lumbago despite L1 vertebroplasty  These symptoms appear to be  radicular in nature.  He has known spinal stenosis.  I recommend a repeat MRi of the lumbar spine to evaluate the L1 vertebroplasty and the possibility of any new fractures.  Assessment of spinal stensosis could direct therapy for the radicular symptoms as well.   Electronically Signed: Arliene Rosenow W 05/22/2016, 11:02 AM   I spent a total of  10 Minutes in face to face in clinical consultation, greater than 50% of which was counseling/coordinating care for low back pain and lower extremity radiculitis.

## 2016-05-28 DIAGNOSIS — M4856XD Collapsed vertebra, not elsewhere classified, lumbar region, subsequent encounter for fracture with routine healing: Secondary | ICD-10-CM | POA: Diagnosis not present

## 2016-05-28 DIAGNOSIS — M545 Low back pain: Secondary | ICD-10-CM | POA: Diagnosis not present

## 2016-05-28 DIAGNOSIS — M47817 Spondylosis without myelopathy or radiculopathy, lumbosacral region: Secondary | ICD-10-CM | POA: Diagnosis not present

## 2016-06-05 ENCOUNTER — Telehealth: Payer: Self-pay | Admitting: Pharmacist

## 2016-06-05 NOTE — Telephone Encounter (Signed)
Received fax from Dr. Maurice SmallIbazebo with [facility] that pt needs lumbar injection. Pt takes Coumadin for afib and has a CHADS2 score of 3 (age, HTN, DM) but no hx of stroke. Ok to hold Coumadin x5 days prior to procedure, pt will need INR morning of his injection. Clearance faxed back to #[phone].

## 2016-06-12 ENCOUNTER — Ambulatory Visit (INDEPENDENT_AMBULATORY_CARE_PROVIDER_SITE_OTHER): Payer: Medicare Other

## 2016-06-12 DIAGNOSIS — I4891 Unspecified atrial fibrillation: Secondary | ICD-10-CM | POA: Diagnosis not present

## 2016-06-12 DIAGNOSIS — Z5181 Encounter for therapeutic drug level monitoring: Secondary | ICD-10-CM | POA: Diagnosis not present

## 2016-06-12 DIAGNOSIS — M47817 Spondylosis without myelopathy or radiculopathy, lumbosacral region: Secondary | ICD-10-CM | POA: Diagnosis not present

## 2016-06-12 DIAGNOSIS — M545 Low back pain: Secondary | ICD-10-CM | POA: Diagnosis not present

## 2016-06-12 LAB — POCT INR: INR: 1.3

## 2016-07-06 DIAGNOSIS — M47817 Spondylosis without myelopathy or radiculopathy, lumbosacral region: Secondary | ICD-10-CM | POA: Diagnosis not present

## 2016-07-06 DIAGNOSIS — M545 Low back pain: Secondary | ICD-10-CM | POA: Diagnosis not present

## 2016-07-06 DIAGNOSIS — M25561 Pain in right knee: Secondary | ICD-10-CM | POA: Diagnosis not present

## 2016-07-22 ENCOUNTER — Ambulatory Visit (INDEPENDENT_AMBULATORY_CARE_PROVIDER_SITE_OTHER): Payer: Medicare Other | Admitting: *Deleted

## 2016-07-22 DIAGNOSIS — Z5181 Encounter for therapeutic drug level monitoring: Secondary | ICD-10-CM | POA: Diagnosis not present

## 2016-07-22 DIAGNOSIS — I4891 Unspecified atrial fibrillation: Secondary | ICD-10-CM

## 2016-07-22 LAB — POCT INR: INR: 1.8

## 2016-07-30 DIAGNOSIS — I10 Essential (primary) hypertension: Secondary | ICD-10-CM | POA: Diagnosis not present

## 2016-07-30 DIAGNOSIS — H9222 Otorrhagia, left ear: Secondary | ICD-10-CM | POA: Diagnosis not present

## 2016-07-30 DIAGNOSIS — E119 Type 2 diabetes mellitus without complications: Secondary | ICD-10-CM | POA: Diagnosis not present

## 2016-07-30 DIAGNOSIS — E7800 Pure hypercholesterolemia, unspecified: Secondary | ICD-10-CM | POA: Diagnosis not present

## 2016-07-30 DIAGNOSIS — Z794 Long term (current) use of insulin: Secondary | ICD-10-CM | POA: Diagnosis not present

## 2016-07-30 DIAGNOSIS — Z0000 Encounter for general adult medical examination without abnormal findings: Secondary | ICD-10-CM | POA: Diagnosis not present

## 2016-07-30 DIAGNOSIS — I4891 Unspecified atrial fibrillation: Secondary | ICD-10-CM | POA: Diagnosis not present

## 2016-07-30 DIAGNOSIS — N3281 Overactive bladder: Secondary | ICD-10-CM | POA: Diagnosis not present

## 2016-08-10 ENCOUNTER — Other Ambulatory Visit: Payer: Self-pay | Admitting: Family Medicine

## 2016-08-10 DIAGNOSIS — R7989 Other specified abnormal findings of blood chemistry: Secondary | ICD-10-CM

## 2016-08-10 DIAGNOSIS — R945 Abnormal results of liver function studies: Principal | ICD-10-CM

## 2016-08-11 ENCOUNTER — Ambulatory Visit
Admission: RE | Admit: 2016-08-11 | Discharge: 2016-08-11 | Disposition: A | Discharge status: Home / Self Care | Payer: Medicare Other | Source: Ambulatory Visit | Attending: Family Medicine | Admitting: Family Medicine

## 2016-08-11 DIAGNOSIS — R7989 Other specified abnormal findings of blood chemistry: Secondary | ICD-10-CM

## 2016-08-11 DIAGNOSIS — R945 Abnormal results of liver function studies: Principal | ICD-10-CM

## 2016-08-19 ENCOUNTER — Encounter (INDEPENDENT_AMBULATORY_CARE_PROVIDER_SITE_OTHER): Payer: Self-pay

## 2016-08-19 ENCOUNTER — Ambulatory Visit (INDEPENDENT_AMBULATORY_CARE_PROVIDER_SITE_OTHER): Payer: Medicare Other | Admitting: *Deleted

## 2016-08-19 DIAGNOSIS — Z5181 Encounter for therapeutic drug level monitoring: Secondary | ICD-10-CM | POA: Diagnosis not present

## 2016-08-19 DIAGNOSIS — I4891 Unspecified atrial fibrillation: Secondary | ICD-10-CM

## 2016-08-19 LAB — POCT INR: INR: 1.8

## 2016-08-20 DIAGNOSIS — R748 Abnormal levels of other serum enzymes: Secondary | ICD-10-CM | POA: Diagnosis not present

## 2016-09-02 ENCOUNTER — Ambulatory Visit (INDEPENDENT_AMBULATORY_CARE_PROVIDER_SITE_OTHER): Payer: Medicare Other | Admitting: *Deleted

## 2016-09-02 DIAGNOSIS — I4891 Unspecified atrial fibrillation: Secondary | ICD-10-CM

## 2016-09-02 DIAGNOSIS — Z5181 Encounter for therapeutic drug level monitoring: Secondary | ICD-10-CM | POA: Diagnosis not present

## 2016-09-02 LAB — POCT INR: INR: 2.2

## 2016-09-10 ENCOUNTER — Encounter (INDEPENDENT_AMBULATORY_CARE_PROVIDER_SITE_OTHER): Payer: Self-pay | Admitting: Ophthalmology

## 2016-09-10 DIAGNOSIS — R748 Abnormal levels of other serum enzymes: Secondary | ICD-10-CM | POA: Diagnosis not present

## 2016-09-15 DIAGNOSIS — Z974 Presence of external hearing-aid: Secondary | ICD-10-CM | POA: Diagnosis not present

## 2016-09-15 DIAGNOSIS — H6123 Impacted cerumen, bilateral: Secondary | ICD-10-CM | POA: Insufficient documentation

## 2016-09-15 DIAGNOSIS — H6122 Impacted cerumen, left ear: Secondary | ICD-10-CM | POA: Diagnosis not present

## 2016-09-15 DIAGNOSIS — Z7901 Long term (current) use of anticoagulants: Secondary | ICD-10-CM | POA: Diagnosis not present

## 2016-09-15 DIAGNOSIS — H9193 Unspecified hearing loss, bilateral: Secondary | ICD-10-CM | POA: Diagnosis not present

## 2016-09-16 ENCOUNTER — Ambulatory Visit (INDEPENDENT_AMBULATORY_CARE_PROVIDER_SITE_OTHER): Payer: Medicare Other | Admitting: *Deleted

## 2016-09-16 DIAGNOSIS — Z5181 Encounter for therapeutic drug level monitoring: Secondary | ICD-10-CM | POA: Diagnosis not present

## 2016-09-16 DIAGNOSIS — I4891 Unspecified atrial fibrillation: Secondary | ICD-10-CM

## 2016-09-16 LAB — POCT INR: INR: 1.6

## 2016-09-18 ENCOUNTER — Encounter (INDEPENDENT_AMBULATORY_CARE_PROVIDER_SITE_OTHER): Payer: Medicare Other | Admitting: Ophthalmology

## 2016-09-18 DIAGNOSIS — I10 Essential (primary) hypertension: Secondary | ICD-10-CM | POA: Diagnosis not present

## 2016-09-18 DIAGNOSIS — H4423 Degenerative myopia, bilateral: Secondary | ICD-10-CM

## 2016-09-18 DIAGNOSIS — E11319 Type 2 diabetes mellitus with unspecified diabetic retinopathy without macular edema: Secondary | ICD-10-CM | POA: Diagnosis not present

## 2016-09-18 DIAGNOSIS — H338 Other retinal detachments: Secondary | ICD-10-CM | POA: Diagnosis not present

## 2016-09-18 DIAGNOSIS — H2512 Age-related nuclear cataract, left eye: Secondary | ICD-10-CM | POA: Diagnosis not present

## 2016-09-18 DIAGNOSIS — H43813 Vitreous degeneration, bilateral: Secondary | ICD-10-CM

## 2016-09-18 DIAGNOSIS — E113293 Type 2 diabetes mellitus with mild nonproliferative diabetic retinopathy without macular edema, bilateral: Secondary | ICD-10-CM | POA: Diagnosis not present

## 2016-09-18 DIAGNOSIS — H35033 Hypertensive retinopathy, bilateral: Secondary | ICD-10-CM | POA: Diagnosis not present

## 2016-09-22 DIAGNOSIS — Z23 Encounter for immunization: Secondary | ICD-10-CM | POA: Diagnosis not present

## 2016-09-30 ENCOUNTER — Ambulatory Visit (INDEPENDENT_AMBULATORY_CARE_PROVIDER_SITE_OTHER): Payer: Medicare Other | Admitting: *Deleted

## 2016-09-30 DIAGNOSIS — Z5181 Encounter for therapeutic drug level monitoring: Secondary | ICD-10-CM

## 2016-09-30 DIAGNOSIS — I4891 Unspecified atrial fibrillation: Secondary | ICD-10-CM

## 2016-09-30 LAB — POCT INR: INR: 1.8

## 2016-09-30 MED ORDER — WARFARIN SODIUM 5 MG PO TABS: ORAL_TABLET | ORAL | 2 refills | Status: DC

## 2016-10-01 DIAGNOSIS — H43822 Vitreomacular adhesion, left eye: Secondary | ICD-10-CM | POA: Diagnosis not present

## 2016-10-01 DIAGNOSIS — H2512 Age-related nuclear cataract, left eye: Secondary | ICD-10-CM | POA: Diagnosis not present

## 2016-10-01 DIAGNOSIS — E113292 Type 2 diabetes mellitus with mild nonproliferative diabetic retinopathy without macular edema, left eye: Secondary | ICD-10-CM | POA: Diagnosis not present

## 2016-10-01 DIAGNOSIS — H35033 Hypertensive retinopathy, bilateral: Secondary | ICD-10-CM | POA: Diagnosis not present

## 2016-10-01 DIAGNOSIS — E113291 Type 2 diabetes mellitus with mild nonproliferative diabetic retinopathy without macular edema, right eye: Secondary | ICD-10-CM | POA: Diagnosis not present

## 2016-10-01 DIAGNOSIS — H25012 Cortical age-related cataract, left eye: Secondary | ICD-10-CM | POA: Diagnosis not present

## 2016-10-13 DIAGNOSIS — H2512 Age-related nuclear cataract, left eye: Secondary | ICD-10-CM | POA: Diagnosis not present

## 2016-10-14 ENCOUNTER — Ambulatory Visit (INDEPENDENT_AMBULATORY_CARE_PROVIDER_SITE_OTHER): Payer: Medicare Other | Admitting: *Deleted

## 2016-10-14 DIAGNOSIS — Z5181 Encounter for therapeutic drug level monitoring: Secondary | ICD-10-CM

## 2016-10-14 DIAGNOSIS — I4891 Unspecified atrial fibrillation: Secondary | ICD-10-CM | POA: Diagnosis not present

## 2016-10-14 HISTORY — PX: CATARACT EXTRACTION W/ INTRAOCULAR LENS IMPLANT: SHX1309

## 2016-10-14 LAB — POCT INR: INR: 1.9

## 2016-10-23 ENCOUNTER — Ambulatory Visit (INDEPENDENT_AMBULATORY_CARE_PROVIDER_SITE_OTHER): Payer: Medicare Other | Admitting: *Deleted

## 2016-10-23 DIAGNOSIS — Z5181 Encounter for therapeutic drug level monitoring: Secondary | ICD-10-CM

## 2016-10-23 DIAGNOSIS — I4891 Unspecified atrial fibrillation: Secondary | ICD-10-CM | POA: Diagnosis not present

## 2016-10-23 LAB — POCT INR: INR: 2.3

## 2016-10-26 DIAGNOSIS — S32010S Wedge compression fracture of first lumbar vertebra, sequela: Secondary | ICD-10-CM | POA: Diagnosis not present

## 2016-10-26 DIAGNOSIS — M5489 Other dorsalgia: Secondary | ICD-10-CM | POA: Diagnosis not present

## 2016-10-26 DIAGNOSIS — M47816 Spondylosis without myelopathy or radiculopathy, lumbar region: Secondary | ICD-10-CM | POA: Diagnosis not present

## 2016-10-26 DIAGNOSIS — M5126 Other intervertebral disc displacement, lumbar region: Secondary | ICD-10-CM | POA: Diagnosis not present

## 2016-10-26 DIAGNOSIS — M5416 Radiculopathy, lumbar region: Secondary | ICD-10-CM | POA: Diagnosis not present

## 2016-10-29 ENCOUNTER — Telehealth: Payer: Self-pay | Admitting: Pharmacist

## 2016-10-29 NOTE — Telephone Encounter (Signed)
Received fax from [facility] that patient needs an epidural steroid injection. Request to hold Coumadin for 5 days. Patient takes Coumadin for afib with a CHADS2 score of 3 (age, HTN, and DM). Ok to hold Coumadin for 5 days prior to procedure. Clearance faxed to [phone].

## 2016-11-02 ENCOUNTER — Ambulatory Visit: Payer: Medicare Other | Admitting: Cardiology

## 2016-11-02 NOTE — Progress Notes (Deleted)
Electrophysiology Office Note   Date:  11/02/2016   ID:  Daniel Cooke, DOB Apr 14, 1924, MRN [id number]  PCP:  Hollice EspyGATES,DONNA RUTH, MD  Electrophysiologist:  Regan LemmingWill Martin [place] Cawood, MD    No chief complaint on file.    History of Present Illness: Daniel Cooke is a 82 y.o. male who presents today for electrophysiology evaluation.   He has a history of hypertension, diabetes, and hyperlipidemia. He was found to be in atrial fibrillation after a fall where he suffered a vertebral fracture.  Had planned cardioversion but was feeling well and thus canceled.  He is currently feeling well without any major complaints. He says that he is continuing to have back pain and is seeing his surgeon soon.  Today, he denies symptoms of palpitations, chest pain, shortness of breath, orthopnea, PND, lower extremity edema, claudication, dizziness, presyncope, syncope, bleeding, or neurologic sequela. The patient is tolerating medications without difficulties and is otherwise without complaint today.   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  Past Medical History:  Diagnosis Date  . Arthritis    both knees -left  pain is worse  . BPH (benign prostatic hypertrophy)   . Diabetes mellitus    oral meds - no insulin  . Hard of hearing    wears bilateral hearing aids  . Hypercholesterolemia   . Pilonidal cyst    PAST HX - NO PROBLEM NOW  . Shingles    "long time ago"   Past Surgical History:  Procedure Laterality Date  . APPENDECTOMY    . CHOLECYSTECTOMY    . EYE SURGERY     left traumatic cataract removed--injury to the eye--states his eyesight is ok in left eye  . KNEE ARTHROSCOPY Left 02/28/2014   Procedure: ARTHROSCOPY LEFT KNEE WITH SYNOVECTOMY;  Surgeon: Loanne DrillingFrank V Aluisio, MD;  Location: [facility];  Service: Orthopedics;  Laterality: Left;  . left rotator cuff repair    . right knee arthroscopy    . TONSILLECTOMY    . TOTAL KNEE ARTHROPLASTY   02/08/2012   Procedure: TOTAL KNEE ARTHROPLASTY;  Surgeon: Loanne DrillingFrank V Aluisio, MD;  Location: [facility];  Service: Orthopedics;  Laterality: Left;  . vascectomy       Current Outpatient Prescriptions  Medication Sig Dispense Refill  . calcium carbonate (OSCAL) 1500 (600 Ca) MG TABS tablet Take 1 tablet by mouth 2 (two) times daily with a meal.    . Cyanocobalamin (VITAMIN B 12 PO) Take by mouth daily.    Marland Kitchen. glipiZIDE (GLUCOTROL) 10 MG tablet Take 10 mg by mouth every evening.     Marland Kitchen. lisinopril (PRINIVIL,ZESTRIL) 20 MG tablet Take 20 mg by mouth at bedtime.    . metFORMIN (GLUCOPHAGE) 1000 MG tablet Take 1,000 mg by mouth 2 (two) times daily with a meal.    . Multiple Vitamin (MULTIVITAMIN WITH MINERALS) TABS tablet Take 1 tablet by mouth daily.    . pioglitazone (ACTOS) 15 MG tablet Take 15 mg by mouth at bedtime.    . simvastatin (ZOCOR) 20 MG tablet Take 20 mg by mouth at bedtime.    Marland Kitchen. terazosin (HYTRIN) 5 MG capsule Take 5 mg by mouth at bedtime.    Marland Kitchen. warfarin (COUMADIN) 5 MG tablet Take as directed by coumadin clinic 50 tablet 2   No current facility-administered medications for this visit.     Allergies:   Codeine and Oxycodone   Social History:  The patient  reports that he has never smoked. He has never used smokeless tobacco. He reports that he drinks alcohol. He reports that he does not use drugs.   Family History:  The patient's family history includes Heart attack in his brother; Heart disease in his brother and mother.    ROS:  Please see the history of present illness.   Otherwise, review of systems is positive for ***.   All other systems are reviewed and negative.    PHYSICAL EXAM: VS:  There were no vitals taken for this visit. , BMI There is no height or weight on file to calculate BMI. GEN: Well nourished, well developed, in no acute distress HEENT: normal Neck: no JVD, carotid bruits, or masses Cardiac: ***RRR; no murmurs, rubs, or gallops,no edema  Respiratory:  clear  to auscultation bilaterally, normal work of breathing GI: soft, nontender, nondistended, + BS MS: no deformity or atrophy Skin: warm and dry Neuro:  Strength and sensation are intact Psych: euthymic mood, full affect  EKG:  EKG is ordered today. The ekg ordered today shows atrial fibrillatin, rate 69***  Recent Labs: 02/03/2016: BUN 27; Creat 1.36; Potassium 4.8; Sodium 143; TSH 4.27    Lipid Panel  No results found for: CHOL, TRIG, HDL, CHOLHDL, VLDL, LDLCALC, LDLDIRECT   Wt Readings from Last 3 Encounters:  05/19/16 191 lb (86.6 kg)  05/05/16 191 lb (86.6 kg)  02/03/16 191 lb 12.8 oz (87 kg)      Other studies Reviewed: Additional studies/ records that were reviewed today include: 12/2010 MPI  Review of the above records today demonstrates:  ECG positive for ischemia, myoview with normal perfusion  02/13/16 TTE - Left ventricle: The cavity size was mildly dilated. Systolic  function was normal. The estimated ejection fraction was in the  range of 60% to 65%. Wall motion was normal; there were no  regional wall motion abnormalities. - Aortic valve: There was trivial regurgitation. - Mitral valve: There was mild regurgitation. - Left atrium: The atrium was mildly to moderately dilated. - Right ventricle: The cavity size was mildly dilated. Wall  thickness was normal. - Right atrium: The atrium was moderately dilated. - Pulmonary arteries: Systolic pressure was moderately increased.  PA peak pressure: 71 mm Hg (S).  ASSESSMENT AND PLAN:  1.  Atrial fibrillation: was noted to incidentally have atrial fibrillation. He is minimally some symptomatic with shortness of breath and fatigue. He has a CHADS2VASC score of 4 and therefore does require anticoagulation.  On warfarin for anticoagulation.  Was planned for DCCV but canceled as he was feeling well.On auscultation, his heart is regular and likely is back in sinus rhythm. No changes made today.   Current medicines are  reviewed at length with the patient today.   The patient does not have concerns regarding his medicines.  The following changes were made today:  none  Labs/ tests ordered today include:  No orders of the defined types were placed in this encounter.    Disposition:   FU with Jesiel Garate 6  months  Signed, Serenitee Fuertes Jorja LoaMartin Jamesmichael Shadd, MD  11/02/2016 9:41 AM     [facility] [address] [place] [address] [phone] (office) [phone] (fax)

## 2016-11-03 ENCOUNTER — Ambulatory Visit: Payer: Medicare Other | Admitting: Cardiology

## 2016-11-03 ENCOUNTER — Encounter: Payer: Self-pay | Admitting: Cardiology

## 2016-11-16 ENCOUNTER — Ambulatory Visit (INDEPENDENT_AMBULATORY_CARE_PROVIDER_SITE_OTHER): Payer: Medicare Other | Admitting: Cardiology

## 2016-11-16 ENCOUNTER — Encounter: Payer: Self-pay | Admitting: Cardiology

## 2016-11-16 VITALS — BP 142/66 | HR 62 | Ht 70.0 in | Wt 216.2 lb

## 2016-11-16 DIAGNOSIS — I480 Paroxysmal atrial fibrillation: Secondary | ICD-10-CM

## 2016-11-16 NOTE — Progress Notes (Signed)
Electrophysiology Office Note   Date:  11/16/2016   ID:  Daniel Sabinarnest P Perfetti, DOB Nov 08, 1924, MRN [id number]  PCP:  Hollice EspyGATES,DONNA RUTH, MD  Electrophysiologist:  Regan LemmingWill Martin Zael Shuman, MD    Chief Complaint  Patient presents with  . Follow-up    PAF     History of Present Illness: Daniel Cooke is a 82 y.o. male who presents today for electrophysiology evaluation.   He has a history of hypertension, diabetes, and hyperlipidemia. He was found to be in atrial fibrillation after a fall where he suffered a vertebral fracture.  He was found to have a pinched nerve around that vertebra and is planned to have injections performed. He has not been taking his Coumadin to prepare for injections. He does say that he has been more short of breath with walking and exertion. He also gets lower extremity edema when he works on the weekends. 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 His lower extremity edema is noticeable towards the end of the day.  Today, he denies symptoms of palpitations, chest pain, shortness of breath, orthopnea, PND, lower extremity edema, claudication, dizziness, presyncope, syncope, bleeding, or neurologic sequela. The patient is tolerating medications without difficulties and is otherwise without complaint today.   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  Past Medical History:  Diagnosis Date  . Arthritis    both knees -left  pain is worse  . BPH (benign prostatic hypertrophy)   . Diabetes mellitus    oral meds - no insulin  . Hard of hearing    wears bilateral hearing aids  . Hypercholesterolemia   . Pilonidal cyst    PAST HX - NO PROBLEM NOW  . Shingles    "long time ago"   Past Surgical History:  Procedure Laterality Date  . APPENDECTOMY    . CHOLECYSTECTOMY    . EYE SURGERY     left traumatic cataract removed--injury to the eye--states his eyesight is  ok in left eye  . KNEE ARTHROSCOPY Left 02/28/2014   Procedure: ARTHROSCOPY LEFT KNEE WITH SYNOVECTOMY;  Surgeon: Loanne DrillingFrank V Aluisio, MD;  Location: [facility];  Service: Orthopedics;  Laterality: Left;  . left rotator cuff repair    . right knee arthroscopy    . TONSILLECTOMY    . TOTAL KNEE ARTHROPLASTY  02/08/2012   Procedure: TOTAL KNEE ARTHROPLASTY;  Surgeon: Loanne DrillingFrank V Aluisio, MD;  Location: [facility];  Service: Orthopedics;  Laterality: Left;  . vascectomy       Current Outpatient Prescriptions  Medication Sig Dispense Refill  . calcium carbonate (OSCAL) 1500 (600 Ca) MG TABS tablet Take 1 tablet by mouth 2 (two) times daily with a meal.    . Cyanocobalamin (VITAMIN B 12 PO) Take by mouth daily.    Marland Kitchen. glipiZIDE (GLUCOTROL) 10 MG tablet Take 10 mg by mouth every evening.     Marland Kitchen. lisinopril (PRINIVIL,ZESTRIL) 20 MG tablet Take 20 mg by mouth at bedtime.    . metFORMIN (GLUCOPHAGE) 1000 MG tablet Take 1,000 mg by mouth 2 (two) times daily with a meal.    . Multiple Vitamin (MULTIVITAMIN WITH MINERALS) TABS tablet Take 1 tablet by mouth daily.    . pioglitazone (ACTOS) 15 MG tablet Take 15 mg by mouth at bedtime.    . simvastatin (ZOCOR) 20 MG tablet Take 20 mg  by mouth at bedtime.    Marland Kitchen. terazosin (HYTRIN) 5 MG capsule Take 5 mg by mouth at bedtime.    Marland Kitchen. warfarin (COUMADIN) 5 MG tablet Take as directed by coumadin clinic 50 tablet 2   No current facility-administered medications for this visit.     Allergies:   Codeine and Oxycodone   Social History:  The patient  reports that he has never smoked. He has never used smokeless tobacco. He reports that he drinks alcohol. He reports that he does not use drugs.   Family History:  The patient's family history includes Heart attack in his brother; Heart disease in his brother and mother.    ROS:  Please see the history of present illness.   Otherwise, review of systems is positive for leg swelling, DOE, back and muscle pain.   All other systems are  reviewed and negative.    PHYSICAL EXAM: VS:  BP (!) 142/66   Pulse 62   Ht 5\' 10"  (1.778 m)   Wt 216 lb 3.2 oz (98.1 kg)   BMI 31.02 kg/m  , BMI Body mass index is 31.02 kg/m. GEN: Well nourished, well developed, in no acute distress  HEENT: normal  Neck: no JVD, carotid bruits, or masses Cardiac: iRRR; no murmurs, rubs, or gallops,no edema  Respiratory:  clear to auscultation bilaterally, normal work of breathing GI: soft, nontender, nondistended, + BS MS: no deformity or atrophy  Skin: warm and dry Neuro:  Strength and sensation are intact Psych: euthymic mood, full affect  EKG:  EKG is ordered today. The ekg ordered today shows atrial fibrillatin, rate 62  Recent Labs: 02/03/2016: BUN 27; Creat 1.36; Potassium 4.8; Sodium 143; TSH 4.27    Lipid Panel  No results found for: CHOL, TRIG, HDL, CHOLHDL, VLDL, LDLCALC, LDLDIRECT   Wt Readings from Last 3 Encounters:  11/16/16 216 lb 3.2 oz (98.1 kg)  05/19/16 191 lb (86.6 kg)  05/05/16 191 lb (86.6 kg)      Other studies Reviewed: Additional studies/ records that were reviewed today include: 12/2010 MPI  Review of the above records today demonstrates:  ECG positive for ischemia, myoview with normal perfusion  02/13/16 TTE - Left ventricle: The cavity size was mildly dilated. Systolic  function was normal. The estimated ejection fraction was in the  range of 60% to 65%. Wall motion was normal; there were no  regional wall motion abnormalities. - Aortic valve: There was trivial regurgitation. - Mitral valve: There was mild regurgitation. - Left atrium: The atrium was mildly to moderately dilated. - Right ventricle: The cavity size was mildly dilated. Wall  thickness was normal. - Right atrium: The atrium was moderately dilated. - Pulmonary arteries: Systolic pressure was moderately increased.  PA peak pressure: 71 mm Hg (S).  ASSESSMENT AND PLAN:  1.  Atrial fibrillation: was noted to incidentally have atrial  fibrillation. He is minimally some symptomatic with shortness of breath and fatigue. He has a CHADS2VASC score of 4 and therefore does require anticoagulation.  On warfarin for anticoagulation.  He is currently short of breath and unfortunately in atrial fibrillation. We Tye Juarez plan for cardioversion. He is planning on having an injection in the spine, and we Niomie Englert continue to monitor his INRs. When he has had 3 successive INRs, we Lylla Eifler have him follow-up in clinic to further discuss.    2. Lower extremity swelling: Likely due to poor venous and lymphatic drainage. I have told him to try compression stockings.  3. Hyperlipidemia: Continue  statin   Current medicines are reviewed at length with the patient today.   The patient does not have concerns regarding his medicines.  The following changes were made today:  none  Labs/ tests ordered today include:  Orders Placed This Encounter  Procedures  . EKG 12-Lead     Disposition:   FU with Danaria Larsen 3  months  Signed, Breken Nazari Jorja Loa, MD  11/16/2016 11:18 AM     [facility] [address] [place] [address] [phone] (office) [phone] (fax)

## 2016-11-16 NOTE — Patient Instructions (Addendum)
Medication Instructions:    Your physician recommends that you continue on your current medications as directed. Please refer to the Current Medication list given to you today.  --- If you need a refill on your cardiac medications before your next appointment, please call your pharmacy. ---  Labwork:  None ordered  Testing/Procedures:  Your provider has recommended a cardioversion. We will call you to arrange an office visit with Dr. Elberta Fortisamnitz once you have restarted your Coumadin.  Follow-Up:  Fabiola Mudgett, RN will call you soon to arrange follow up appointment    Any Other Special Instructions Will Be Listed Below (If Applicable).    Thank you for choosing [facility]!!   Dory HornSherri Lemonte Al, RN [phone]

## 2016-11-24 DIAGNOSIS — M5416 Radiculopathy, lumbar region: Secondary | ICD-10-CM | POA: Diagnosis not present

## 2016-11-24 DIAGNOSIS — M5126 Other intervertebral disc displacement, lumbar region: Secondary | ICD-10-CM | POA: Diagnosis not present

## 2016-11-25 ENCOUNTER — Inpatient Hospital Stay (HOSPITAL_COMMUNITY)
Admission: EM | Admit: 2016-11-25 | Discharge: 2016-11-27 | DRG: 291 | Disposition: A | Discharge status: Home / Self Care | Payer: Medicare Other | Attending: Internal Medicine | Admitting: Internal Medicine

## 2016-11-25 ENCOUNTER — Emergency Department (HOSPITAL_COMMUNITY): Payer: Medicare Other

## 2016-11-25 ENCOUNTER — Encounter (HOSPITAL_COMMUNITY): Payer: Self-pay | Admitting: Emergency Medicine

## 2016-11-25 DIAGNOSIS — Z885 Allergy status to narcotic agent status: Secondary | ICD-10-CM

## 2016-11-25 DIAGNOSIS — I5033 Acute on chronic diastolic (congestive) heart failure: Secondary | ICD-10-CM | POA: Diagnosis present

## 2016-11-25 DIAGNOSIS — I10 Essential (primary) hypertension: Secondary | ICD-10-CM | POA: Diagnosis not present

## 2016-11-25 DIAGNOSIS — N1832 Chronic kidney disease, stage 3b: Secondary | ICD-10-CM | POA: Diagnosis present

## 2016-11-25 DIAGNOSIS — I480 Paroxysmal atrial fibrillation: Secondary | ICD-10-CM | POA: Diagnosis present

## 2016-11-25 DIAGNOSIS — N183 Chronic kidney disease, stage 3 unspecified: Secondary | ICD-10-CM | POA: Diagnosis present

## 2016-11-25 DIAGNOSIS — R0602 Shortness of breath: Secondary | ICD-10-CM | POA: Diagnosis not present

## 2016-11-25 DIAGNOSIS — N400 Enlarged prostate without lower urinary tract symptoms: Secondary | ICD-10-CM | POA: Diagnosis not present

## 2016-11-25 DIAGNOSIS — I130 Hypertensive heart and chronic kidney disease with heart failure and stage 1 through stage 4 chronic kidney disease, or unspecified chronic kidney disease: Secondary | ICD-10-CM | POA: Diagnosis not present

## 2016-11-25 DIAGNOSIS — N179 Acute kidney failure, unspecified: Secondary | ICD-10-CM | POA: Diagnosis present

## 2016-11-25 DIAGNOSIS — E785 Hyperlipidemia, unspecified: Secondary | ICD-10-CM | POA: Diagnosis present

## 2016-11-25 DIAGNOSIS — E119 Type 2 diabetes mellitus without complications: Secondary | ICD-10-CM

## 2016-11-25 DIAGNOSIS — Z7901 Long term (current) use of anticoagulants: Secondary | ICD-10-CM

## 2016-11-25 DIAGNOSIS — E1122 Type 2 diabetes mellitus with diabetic chronic kidney disease: Secondary | ICD-10-CM | POA: Diagnosis present

## 2016-11-25 DIAGNOSIS — Z974 Presence of external hearing-aid: Secondary | ICD-10-CM

## 2016-11-25 DIAGNOSIS — D631 Anemia in chronic kidney disease: Secondary | ICD-10-CM | POA: Diagnosis present

## 2016-11-25 DIAGNOSIS — Z96652 Presence of left artificial knee joint: Secondary | ICD-10-CM | POA: Diagnosis present

## 2016-11-25 DIAGNOSIS — H9190 Unspecified hearing loss, unspecified ear: Secondary | ICD-10-CM | POA: Diagnosis not present

## 2016-11-25 DIAGNOSIS — N5089 Other specified disorders of the male genital organs: Secondary | ICD-10-CM | POA: Diagnosis not present

## 2016-11-25 DIAGNOSIS — I482 Chronic atrial fibrillation, unspecified: Secondary | ICD-10-CM | POA: Diagnosis present

## 2016-11-25 DIAGNOSIS — D638 Anemia in other chronic diseases classified elsewhere: Secondary | ICD-10-CM | POA: Diagnosis present

## 2016-11-25 DIAGNOSIS — I509 Heart failure, unspecified: Secondary | ICD-10-CM

## 2016-11-25 DIAGNOSIS — R339 Retention of urine, unspecified: Secondary | ICD-10-CM | POA: Diagnosis not present

## 2016-11-25 DIAGNOSIS — E876 Hypokalemia: Secondary | ICD-10-CM | POA: Diagnosis present

## 2016-11-25 DIAGNOSIS — Z8249 Family history of ischemic heart disease and other diseases of the circulatory system: Secondary | ICD-10-CM

## 2016-11-25 DIAGNOSIS — Z7984 Long term (current) use of oral hypoglycemic drugs: Secondary | ICD-10-CM

## 2016-11-25 DIAGNOSIS — Z79899 Other long term (current) drug therapy: Secondary | ICD-10-CM

## 2016-11-25 DIAGNOSIS — I5031 Acute diastolic (congestive) heart failure: Secondary | ICD-10-CM | POA: Diagnosis not present

## 2016-11-25 HISTORY — DX: Chronic diastolic (congestive) heart failure: I50.32

## 2016-11-25 HISTORY — DX: Unspecified osteoarthritis, unspecified site: M19.90

## 2016-11-25 HISTORY — DX: Synovitis and tenosynovitis, unspecified: M65.9

## 2016-11-25 HISTORY — DX: Type 2 diabetes mellitus without complications: E11.9

## 2016-11-25 HISTORY — DX: Essential (primary) hypertension: I10

## 2016-11-25 HISTORY — DX: Paroxysmal atrial fibrillation: I48.0

## 2016-11-25 HISTORY — DX: Pneumonia, unspecified organism: J18.9

## 2016-11-25 LAB — CBC
HCT: 27.8 % — ABNORMAL LOW (ref 39.0–52.0)
Hemoglobin: 9.1 g/dL — ABNORMAL LOW (ref 13.0–17.0)
MCH: 31.5 pg (ref 26.0–34.0)
MCHC: 32.7 g/dL (ref 30.0–36.0)
MCV: 96.2 fL (ref 78.0–100.0)
PLATELETS: 188 10*3/uL (ref 150–400)
RBC: 2.89 MIL/uL — ABNORMAL LOW (ref 4.22–5.81)
RDW: 17.7 % — AB (ref 11.5–15.5)
WBC: 8.2 10*3/uL (ref 4.0–10.5)

## 2016-11-25 LAB — GLUCOSE, CAPILLARY: Glucose-Capillary: 162 mg/dL — ABNORMAL HIGH (ref 65–99)

## 2016-11-25 LAB — BASIC METABOLIC PANEL
Anion gap: 10 (ref 5–15)
BUN: 49 mg/dL — AB (ref 6–20)
CALCIUM: 9.4 mg/dL (ref 8.9–10.3)
CHLORIDE: 107 mmol/L (ref 101–111)
CO2: 25 mmol/L (ref 22–32)
Creatinine, Ser: 1.56 mg/dL — ABNORMAL HIGH (ref 0.61–1.24)
GFR calc Af Amer: 43 mL/min — ABNORMAL LOW (ref >60)
GFR, EST NON AFRICAN AMERICAN: 37 mL/min — AB (ref >60)
Glucose, Bld: 220 mg/dL — ABNORMAL HIGH (ref 65–99)
Potassium: 3.8 mmol/L (ref 3.5–5.1)
SODIUM: 142 mmol/L (ref 135–145)

## 2016-11-25 LAB — TROPONIN I: Troponin I: 0.04 ng/mL (ref <0.03)

## 2016-11-25 LAB — I-STAT TROPONIN, ED: TROPONIN I, POC: 0 ng/mL (ref 0.00–0.08)

## 2016-11-25 LAB — PROTIME-INR
INR: 1.27
Prothrombin Time: 15.9 seconds — ABNORMAL HIGH (ref 11.4–15.2)

## 2016-11-25 LAB — BRAIN NATRIURETIC PEPTIDE: B NATRIURETIC PEPTIDE 5: 1068.2 pg/mL — AB (ref 0.0–100.0)

## 2016-11-25 MED ORDER — ONDANSETRON HCL 4 MG/2ML IJ SOLN: 4.0000 mg | Freq: Four times a day (QID) | INTRAMUSCULAR | Status: DC | PRN

## 2016-11-25 MED ORDER — FUROSEMIDE 10 MG/ML IJ SOLN
40.0000 mg | Freq: Once | INTRAMUSCULAR | Status: AC
  Administered 2016-11-25: 40 mg via INTRAVENOUS
  Filled 2016-11-25: qty 4

## 2016-11-25 MED ORDER — LISINOPRIL 20 MG PO TABS
20.0000 mg | ORAL_TABLET | Freq: Every day | ORAL | Status: DC
  Administered 2016-11-26: 20 mg via ORAL
  Filled 2016-11-25: qty 1

## 2016-11-25 MED ORDER — SODIUM CHLORIDE 0.9% FLUSH: 3.0000 mL | INTRAVENOUS | Status: DC | PRN

## 2016-11-25 MED ORDER — INSULIN ASPART 100 UNIT/ML ~~LOC~~ SOLN
0.0000 [IU] | Freq: Three times a day (TID) | SUBCUTANEOUS | Status: DC
  Administered 2016-11-26: 4 [IU] via SUBCUTANEOUS
  Administered 2016-11-26: 3 [IU] via SUBCUTANEOUS

## 2016-11-25 MED ORDER — WARFARIN SODIUM 5 MG PO TABS
5.0000 mg | ORAL_TABLET | Freq: Every day | ORAL | Status: DC
  Administered 2016-11-25: 5 mg via ORAL
  Filled 2016-11-25 (×2): qty 1

## 2016-11-25 MED ORDER — INSULIN ASPART 100 UNIT/ML ~~LOC~~ SOLN: 0.0000 [IU] | Freq: Every day | SUBCUTANEOUS | Status: DC

## 2016-11-25 MED ORDER — ACETAMINOPHEN 325 MG PO TABS: 650.0000 mg | ORAL_TABLET | ORAL | Status: DC | PRN

## 2016-11-25 MED ORDER — SODIUM CHLORIDE 0.9 % IV SOLN: 250.0000 mL | INTRAVENOUS | Status: DC | PRN

## 2016-11-25 MED ORDER — SIMVASTATIN 20 MG PO TABS
20.0000 mg | ORAL_TABLET | Freq: Every day | ORAL | Status: DC
  Administered 2016-11-25 – 2016-11-26 (×2): 20 mg via ORAL
  Filled 2016-11-25 (×2): qty 1

## 2016-11-25 MED ORDER — SODIUM CHLORIDE 0.9% FLUSH
3.0000 mL | Freq: Two times a day (BID) | INTRAVENOUS | Status: DC
  Administered 2016-11-25 – 2016-11-26 (×3): 3 mL via INTRAVENOUS

## 2016-11-25 MED ORDER — TERAZOSIN HCL 5 MG PO CAPS
5.0000 mg | ORAL_CAPSULE | Freq: Every day | ORAL | Status: DC
  Administered 2016-11-25 – 2016-11-26 (×2): 5 mg via ORAL
  Filled 2016-11-25 (×2): qty 1

## 2016-11-25 MED ORDER — FUROSEMIDE 10 MG/ML IJ SOLN
40.0000 mg | Freq: Two times a day (BID) | INTRAMUSCULAR | Status: DC
  Administered 2016-11-26 – 2016-11-27 (×3): 40 mg via INTRAVENOUS
  Filled 2016-11-25 (×2): qty 4

## 2016-11-25 MED ORDER — WARFARIN - PHYSICIAN DOSING INPATIENT: Freq: Every day | Status: DC

## 2016-11-25 NOTE — Progress Notes (Signed)
EDCM spoke to patient at bedside.  Patient reports he lives at home with his wife.  Patient reports he is able to complete his ADL's on his own.  Patient reports he has received home health services for PT in the past but cannot remember the agency.  Patient reports he does not have any dme at home.  Patient confirms his pcp is Dr. Shaune Pollackonna Gates at [facility].  Patient reports he does not have any discharge needs at this time.  No further EDCM needs at this time.

## 2016-11-25 NOTE — ED Notes (Signed)
[facility] has picked up patient for transport to [facility].  Belongings with patient.

## 2016-11-25 NOTE — Progress Notes (Signed)
CRITICAL VALUE ALERT  Critical value received:  Troponin 0.04  Date of notification:  11/25/16  Time of notification:  2344  Critical value read back:Yes.    Nurse who received alert:  Leanor KailAmor Roseanna Koplin  MD notified (1st page):  K.Kirby  Time of first page:  2344  MD notified (2nd page):  Time of second page:  Responding MD:  awaiting  Time MD responded: awaiting

## 2016-11-25 NOTE — ED Provider Notes (Signed)
Complains of dyspnea on exertion for the past 2-3 weeks. He also complains of difficulty urinating and empty his bladder for approximately the past 3 months. On exam patient appears in no distress HEENT exam no facial asymmetry neck supple positive JVD lungs Rales right base no respiratory distress heart regular rate and rhythm abdomen obese, nontender. Extremities with trace pretibial pitting edema. Skin warm dry genitalia normal male   Doug SouSam Laquan Beier, MD 11/25/16 2349

## 2016-11-25 NOTE — H&P (Signed)
History and Physical    Daniel Cooke AVW:[id number] DOB: [birth year] DOA: 11/25/2016  PCP: Hollice EspyGATES,DONNA RUTH, MD  [facility]  Patient coming from: Urology office  Chief Complaint: Progressive shortness of breath and swelling x several weeks  HPI: Daniel Cooke is a 82 y.o. gentleman with a history of chronic atrial fibrillation (CHADS-Vasc score of 4, anticoagulated with warfarin), HTN, HLD, DM, BPH, and OA who reports several weeks of progressive DOE and lower extremity edema.  He estimates that he has gained at least 15 lbs in the past few weeks.  He has noticed increased abdominal girth and his clothes are fitting tighter.  Over the past few days, he has had progressive scrotal swelling, which prompted presentation to his PCP's office.  He was seen by one of her partners, who ultimately referred him to urology, who ultimately [facility].    Of note, warfarin had been on hold since 12/6 for epidural injection for back pain, which was done yesterday by neurosurgery.  Warfarin was resumed last night, without a bridge.  ED Course: EKG shows rate controlled atrial fibrillation.  Chest xray shows cardiomegaly with mild pulmonary vascular congestion and small effusions consistent with CHF.  Troponin negative.  BNP 1068.  The patient has received lasix 40mg  IV one time in the ED.  Hospitalist asked to place in observation.  Family requests transfer to [facility].  Review of Systems: As per HPI otherwise 10 point review of systems negative.    Past Medical History:  Diagnosis Date  . Arthritis    both knees -left  pain is worse  . BPH (benign prostatic hypertrophy)   . Diabetes mellitus    oral meds - no insulin  . Hard of hearing    wears bilateral hearing aids  . Hypercholesterolemia   . Pilonidal cyst    PAST HX - NO PROBLEM NOW  . Shingles    "long time ago"    Past Surgical History:  Procedure Laterality Date  .  APPENDECTOMY    . CHOLECYSTECTOMY    . EYE SURGERY     left traumatic cataract removed--injury to the eye--states his eyesight is ok in left eye  . KNEE ARTHROSCOPY Left 02/28/2014   Procedure: ARTHROSCOPY LEFT KNEE WITH SYNOVECTOMY;  Surgeon: Loanne DrillingFrank V Aluisio, MD;  Location: [facility];  Service: Orthopedics;  Laterality: Left;  . left rotator cuff repair    . right knee arthroscopy    . TONSILLECTOMY    . TOTAL KNEE ARTHROPLASTY  02/08/2012   Procedure: TOTAL KNEE ARTHROPLASTY;  Surgeon: Loanne DrillingFrank V Aluisio, MD;  Location: [facility];  Service: Orthopedics;  Laterality: Left;  . vascectomy       reports that he has never smoked. He has never used smokeless tobacco. He reports that he drinks alcohol. He reports that he does not use drugs.  He is married.  He has three adult children.  He does not have a living will or advance directive.  He does not have a designated medical POA.  Allergies  Allergen Reactions  . Codeine Nausea And Vomiting and Other (See Comments)    MAKES ME CRAZY  . Oxycodone Nausea And Vomiting and Other (See Comments)    MAKES ME CRAZY    Family History  Problem Relation Age of Onset  . Heart disease Mother   . Heart disease Brother   . Heart attack Brother      Prior to Admission  medications   Medication Sig Start Date End Date Taking? Authorizing Provider  acetaminophen (TYLENOL) 325 MG tablet Take 325 mg by mouth every 6 (six) hours as needed for mild pain.   Yes Historical Provider, MD  calcium carbonate (OSCAL) 1500 (600 Ca) MG TABS tablet Take 1 tablet by mouth 2 (two) times daily with a meal.   Yes Historical Provider, MD  Cyanocobalamin (VITAMIN B 12 PO) Take by mouth daily.   Yes Historical Provider, MD  glipiZIDE (GLUCOTROL) 10 MG tablet Take 10 mg by mouth every evening.    Yes Historical Provider, MD  lisinopril (PRINIVIL,ZESTRIL) 20 MG tablet Take 20 mg by mouth at bedtime.   Yes Historical Provider, MD  metFORMIN (GLUCOPHAGE) 1000 MG tablet Take 1,000 mg  by mouth 2 (two) times daily with a meal.   Yes Historical Provider, MD  Multiple Vitamin (MULTIVITAMIN WITH MINERALS) TABS tablet Take 1 tablet by mouth daily.   Yes Historical Provider, MD  pioglitazone (ACTOS) 15 MG tablet Take 15 mg by mouth at bedtime.   Yes Historical Provider, MD  simvastatin (ZOCOR) 20 MG tablet Take 20 mg by mouth at bedtime.   Yes Historical Provider, MD  terazosin (HYTRIN) 5 MG capsule Take 5 mg by mouth at bedtime.   Yes Historical Provider, MD  warfarin (COUMADIN) 5 MG tablet Take as directed by coumadin clinic 09/30/16  Yes Will Jorja Loa, MD    Physical Exam: Vitals:   11/25/16 1701 11/25/16 1730 11/25/16 1830 11/25/16 1900  BP: 160/87 145/77 140/78 139/72  Pulse: 77 77 72 78  Resp: 22 19 23 23   Temp:      TempSrc:      SpO2: 100% 100% 93% 98%      Constitutional: NAD, calm, comfortable Vitals:   11/25/16 1701 11/25/16 1730 11/25/16 1830 11/25/16 1900  BP: 160/87 145/77 140/78 139/72  Pulse: 77 77 72 78  Resp: 22 19 23 23   Temp:      TempSrc:      SpO2: 100% 100% 93% 98%   Eyes: PERRL, lids and conjunctivae normal ENMT: Mucous membranes are moist. Posterior pharynx clear of any exudate or lesions. Normal dentition.  Neck: normal appearance, supple Respiratory: clear to auscultation bilaterally, no wheezing, no crackles. Normal respiratory effort. No accessory muscle use.  Cardiovascular: Normal rate but irregular.  No murmurs / rubs / gallops. 2+ pitting edema in both legs, scrotal edema, abdominal wall edema.  2+ pedal pulses. GI: abdomen is compressible but firm, protuberant.  No tenderness.  Bowel sounds are present. Musculoskeletal:  No joint deformity in upper and lower extremities. Good ROM, no contractures. Normal muscle tone.  Skin: no rashes, warm and dry Neurologic: No focal deficits. Psychiatric: Normal judgment and insight. Alert and oriented x 3. Normal mood.     Labs on Admission: I have personally reviewed following labs  and imaging studies  CBC:  Recent Labs Lab 11/25/16 1437  WBC 8.2  HGB 9.1*  HCT 27.8*  MCV 96.2  PLT 188   Basic Metabolic Panel:  Recent Labs Lab 11/25/16 1437  NA 142  K 3.8  CL 107  CO2 25  GLUCOSE 220*  BUN 49*  CREATININE 1.56*  CALCIUM 9.4   GFR: Estimated Creatinine Clearance: 36.9 mL/min (by C-G formula based on SCr of 1.56 mg/dL (H)).  Coagulation Profile:  Recent Labs Lab 11/25/16 1448  INR 1.27   Troponin negative.  Radiological Exams on Admission: Dg Chest 2 View  Result Date: 11/25/2016 CLINICAL  DATA:  Shortness of breath for 3-4 weeks intermittently, urinary frequency EXAM: CHEST  2 VIEW COMPARISON:  Chest x-ray of 02/22/2014 FINDINGS: There is cardiomegaly present with mild pulmonary vascular congestion and small effusions consistent with mild congestive heart failure. No pneumonia is seen. There are degenerative changes in the lower thoracic spine. IMPRESSION: Mild CHF with small effusions. Electronically Signed   By: Dwyane DeePaul  Barry M.D.   On: 11/25/2016 15:33    EKG: Independently reviewed. Rate controlled atrial fibrillation.  Assessment/Plan Principal Problem:   Acute CHF (congestive heart failure) (HCC) Active Problems:   Atrial fibrillation (HCC) [I48.91]   CKD (chronic kidney disease)   Anemia   HTN (hypertension)   HLD (hyperlipidemia)   Diabetes (HCC)      Acute CHF --Telemetry monitoring --Will need to call cardiology in the AM; family expecting this. --Diuresis with IV lasix 40mg  q12h for now, next dose upon arrival to the floor --Strict I/O, daily weights --Serial troponin --Complete echo in the AM --1500cc fluid restriction  Mild AKI on CKD 3, baseline creatinine around 1.4, creatinine tonight 1.56 with elevated BUN.  Likely pre-renal and related to volume overload. --Follow trend with diuresis --Will continue lisinopril for now  Chronic atrial fibrillation --Continue home dose of warfarin, no lovenox bridge due to  epidural injection yesterday  HTN --Lisinopril  DM --Hold oral meds.  SSI  Coverage AC/HS.  HLD --Zocor  BPH --Terazosin   DVT prophylaxis: Full anticoagulation with warfarin Code Status: FULL Family Communication: Patient's wife and two daughters present [facility] at [facility]. Disposition Plan: Expect he will go home at discharge. Consults called: None but cardiology/heart failure will need to be called in the AM. Admission status: Place in observation with telemetry monitoring.   TIME SPENT: 70 minutes   Jerene Bearsarter,Kaelene Elliston Harrison MD [facility] Pager [phone]  If 7PM-7AM, please contact night-coverage [email or web address] Password [email or web address]  11/25/2016, 8:03 PM

## 2016-11-25 NOTE — ED Provider Notes (Signed)
[facility]-EMERGENCY DEPT Provider Note   CSN: [id number] Arrival date & time: 11/25/16  1413     History   Chief Complaint Chief Complaint  Patient presents with  . Shortness of Breath    HPI Daniel Cooke is a 82 y.o. male.  HPI   Pt with hx afib, HTN, HLD, DM presents with 3-4 weeks of intermittent SOB and DOE.  Denies any CP.  Has had some increased swelling in his legs and abdomen.  Denies any fevers, cough, abdominal pain, change in urinary or bowel habits.  Was sent to ED by urology for fluid overload.    Past Medical History:  Diagnosis Date  . Arthritis    both knees -left  pain is worse  . BPH (benign prostatic hypertrophy)   . Diabetes mellitus    oral meds - no insulin  . Hard of hearing    wears bilateral hearing aids  . Hypercholesterolemia   . Pilonidal cyst    PAST HX - NO PROBLEM NOW  . Shingles    "long time ago"    Patient Active Problem List   Diagnosis Date Noted  . Acute CHF (congestive heart failure) (HCC) 11/25/2016  . Atrial fibrillation (HCC) [I48.91] 03/20/2016  . Encounter for therapeutic drug monitoring 03/20/2016  . Synovitis of knee 02/27/2014  . Pilonidal abscess 07/12/2012  . Postop Azotemia 02/15/2012  . Postop Acute blood loss anemia 02/15/2012  . OA (osteoarthritis) of knee 02/08/2012    Past Surgical History:  Procedure Laterality Date  . APPENDECTOMY    . CHOLECYSTECTOMY    . EYE SURGERY     left traumatic cataract removed--injury to the eye--states his eyesight is ok in left eye  . KNEE ARTHROSCOPY Left 02/28/2014   Procedure: ARTHROSCOPY LEFT KNEE WITH SYNOVECTOMY;  Surgeon: Loanne DrillingFrank V Aluisio, MD;  Location: [facility];  Service: Orthopedics;  Laterality: Left;  . left rotator cuff repair    . right knee arthroscopy    . TONSILLECTOMY    . TOTAL KNEE ARTHROPLASTY  02/08/2012   Procedure: TOTAL KNEE ARTHROPLASTY;  Surgeon: Loanne DrillingFrank V Aluisio, MD;  Location: [facility];  Service: Orthopedics;  Laterality: Left;  . vascectomy          Home Medications    Prior to Admission medications   Medication Sig Start Date End Date Taking? Authorizing Provider  acetaminophen (TYLENOL) 325 MG tablet Take 325 mg by mouth every 6 (six) hours as needed for mild pain.   Yes Historical Provider, MD  calcium carbonate (OSCAL) 1500 (600 Ca) MG TABS tablet Take 1 tablet by mouth 2 (two) times daily with a meal.   Yes Historical Provider, MD  Cyanocobalamin (VITAMIN B 12 PO) Take by mouth daily.   Yes Historical Provider, MD  glipiZIDE (GLUCOTROL) 10 MG tablet Take 10 mg by mouth every evening.    Yes Historical Provider, MD  lisinopril (PRINIVIL,ZESTRIL) 20 MG tablet Take 20 mg by mouth at bedtime.   Yes Historical Provider, MD  metFORMIN (GLUCOPHAGE) 1000 MG tablet Take 1,000 mg by mouth 2 (two) times daily with a meal.   Yes Historical Provider, MD  Multiple Vitamin (MULTIVITAMIN WITH MINERALS) TABS tablet Take 1 tablet by mouth daily.   Yes Historical Provider, MD  pioglitazone (ACTOS) 15 MG tablet Take 15 mg by mouth at bedtime.   Yes Historical Provider, MD  simvastatin (ZOCOR) 20 MG tablet Take 20 mg by mouth at bedtime.   Yes Historical Provider, MD  terazosin (HYTRIN) 5 MG  capsule Take 5 mg by mouth at bedtime.   Yes Historical Provider, MD  warfarin (COUMADIN) 5 MG tablet Take as directed by coumadin clinic 09/30/16  Yes Will Jorja Loa, MD    Family History Family History  Problem Relation Age of Onset  . Heart disease Mother   . Heart disease Brother   . Heart attack Brother     Social History Social History  Substance Use Topics  . Smoking status: Never Smoker  . Smokeless tobacco: Never Used  . Alcohol use Yes     Comment: occasionally     Allergies   Codeine and Oxycodone   Review of Systems Review of Systems  All other systems reviewed and are negative.    Physical Exam Updated Vital Signs BP 140/78   Pulse 72   Temp 97.7 F (36.5 C) (Oral)   Resp 23   SpO2 93%   Physical Exam   Constitutional: He appears well-developed and well-nourished. No distress.  HENT:  Head: Normocephalic and atraumatic.  Neck: Neck supple.  Cardiovascular: Normal rate and regular rhythm.   Pulmonary/Chest: Effort normal and breath sounds normal. No respiratory distress. He has no wheezes. He has no rales.  Abdominal: Soft. He exhibits no distension and no mass. There is no tenderness. There is no rebound and no guarding.  General fullness  Musculoskeletal: He exhibits edema.  Pitting edema to bilateral lower extremities through the knee  Neurological: He is alert. He exhibits normal muscle tone.  Skin: He is not diaphoretic.  Nursing note and vitals reviewed.    ED Treatments / Results  Labs (all labs ordered are listed, but only abnormal results are displayed) Labs Reviewed  BASIC METABOLIC PANEL - Abnormal; Notable for the following:       Result Value   Glucose, Bld 220 (*)    BUN 49 (*)    Creatinine, Ser 1.56 (*)    GFR calc non Af Amer 37 (*)    GFR calc Af Amer 43 (*)    All other components within normal limits  CBC - Abnormal; Notable for the following:    RBC 2.89 (*)    Hemoglobin 9.1 (*)    HCT 27.8 (*)    RDW 17.7 (*)    All other components within normal limits  BRAIN NATRIURETIC PEPTIDE - Abnormal; Notable for the following:    B Natriuretic Peptide 1,068.2 (*)    All other components within normal limits  PROTIME-INR  I-STAT TROPOININ, ED    EKG  EKG Interpretation  Date/Time:  Wednesday November 25 2016 14:28:32 EST Ventricular Rate:  67 PR Interval:    QRS Duration: 89 QT Interval:  422 QTC Calculation: 446 R Axis:   120 Text Interpretation:  Age not entered, assumed to be  82 years old for purpose of ECG interpretation Atrial fibrillation Low voltage, precordial leads Probable right ventricular hypertrophy Nonspecific T abnrm, anterolateral leads No significant change since last tracing Confirmed by Ethelda Chick  MD, SAM [id number]) on 11/25/2016  2:37:56 PM       Radiology Dg Chest 2 View  Result Date: 11/25/2016 CLINICAL DATA:  Shortness of breath for 3-4 weeks intermittently, urinary frequency EXAM: CHEST  2 VIEW COMPARISON:  Chest x-ray of 02/22/2014 FINDINGS: There is cardiomegaly present with mild pulmonary vascular congestion and small effusions consistent with mild congestive heart failure. No pneumonia is seen. There are degenerative changes in the lower thoracic spine. IMPRESSION: Mild CHF with small effusions. Electronically Signed   By:  Dwyane DeePaul  Barry M.D.   On: 11/25/2016 15:33    Procedures Procedures (including critical care time)  Medications Ordered in ED Medications  furosemide (LASIX) injection 40 mg (40 mg Intravenous Given 11/25/16 1703)     Initial Impression / Assessment and Plan / ED Course  I have reviewed the triage vital signs and the nursing notes.  Pertinent labs & imaging results that were available during my care of the patient were reviewed by me and considered in my medical decision making (see chart for details).  Clinical Course     Afebrile nontoxic patient with hx afib, HTN, HLD, DM with 3-4 weeks of increasing SOB and peripheral edema.  Had echo 02/2016 with EF 60-65%.  Given lasix 40mL.  Pt also seen and examined by Dr Ethelda ChickJacubowitz.  Admitted to [facility], Dr Montez Moritaarter accepting.    After admission family expressed a desire for patient to be admitted at [facility] instead of [facility].  Flow manager will reroute accepting MD to Dr Julian ReilGardner at [facility].  Dr Montez Moritaarter will see patient in ED to start process.    Final Clinical Impressions(s) / ED Diagnoses   Final diagnoses:  Acute congestive heart failure, unspecified congestive heart failure type [facility](HCC)    New Prescriptions New Prescriptions   No medications on file     Trixie Dredgemily Shray Hunley, PA-C 11/25/16 1836    Trixie Dredgemily Amaiya Scruton, PA-C 11/25/16 Wanda Plump1935    Sam Jacubowitz, MD 11/25/16 [phone]

## 2016-11-25 NOTE — ED Triage Notes (Signed)
Per pt, states SOB on and off for 3-4 weeks-states was seen at urology today for urinary frequency-sent here for fluid overload-saw cardiology last week and had a normal eval

## 2016-11-25 NOTE — ED Notes (Signed)
Bed: WA05 Expected date:  Expected time:  Means of arrival:  Comments: Hold for triage 

## 2016-11-25 NOTE — ED Notes (Signed)
Per NP at [facility], patient having SOB, scrotal edema, and LE edema-coming to ED for eval

## 2016-11-26 ENCOUNTER — Observation Stay (HOSPITAL_COMMUNITY): Payer: Medicare Other

## 2016-11-26 ENCOUNTER — Encounter (HOSPITAL_COMMUNITY): Payer: Self-pay | Admitting: Nurse Practitioner

## 2016-11-26 DIAGNOSIS — I482 Chronic atrial fibrillation: Secondary | ICD-10-CM | POA: Diagnosis not present

## 2016-11-26 DIAGNOSIS — I5031 Acute diastolic (congestive) heart failure: Secondary | ICD-10-CM | POA: Diagnosis not present

## 2016-11-26 DIAGNOSIS — N183 Chronic kidney disease, stage 3 (moderate): Secondary | ICD-10-CM | POA: Diagnosis not present

## 2016-11-26 DIAGNOSIS — D638 Anemia in other chronic diseases classified elsewhere: Secondary | ICD-10-CM | POA: Diagnosis not present

## 2016-11-26 DIAGNOSIS — E876 Hypokalemia: Secondary | ICD-10-CM

## 2016-11-26 DIAGNOSIS — H9190 Unspecified hearing loss, unspecified ear: Secondary | ICD-10-CM | POA: Diagnosis present

## 2016-11-26 DIAGNOSIS — I130 Hypertensive heart and chronic kidney disease with heart failure and stage 1 through stage 4 chronic kidney disease, or unspecified chronic kidney disease: Secondary | ICD-10-CM | POA: Diagnosis present

## 2016-11-26 DIAGNOSIS — Z7901 Long term (current) use of anticoagulants: Secondary | ICD-10-CM | POA: Diagnosis not present

## 2016-11-26 DIAGNOSIS — N400 Enlarged prostate without lower urinary tract symptoms: Secondary | ICD-10-CM | POA: Diagnosis present

## 2016-11-26 DIAGNOSIS — Z7984 Long term (current) use of oral hypoglycemic drugs: Secondary | ICD-10-CM | POA: Diagnosis not present

## 2016-11-26 DIAGNOSIS — E1122 Type 2 diabetes mellitus with diabetic chronic kidney disease: Secondary | ICD-10-CM | POA: Diagnosis not present

## 2016-11-26 DIAGNOSIS — Z96652 Presence of left artificial knee joint: Secondary | ICD-10-CM | POA: Diagnosis present

## 2016-11-26 DIAGNOSIS — Z885 Allergy status to narcotic agent status: Secondary | ICD-10-CM | POA: Diagnosis not present

## 2016-11-26 DIAGNOSIS — Z974 Presence of external hearing-aid: Secondary | ICD-10-CM | POA: Diagnosis not present

## 2016-11-26 DIAGNOSIS — I10 Essential (primary) hypertension: Secondary | ICD-10-CM | POA: Diagnosis not present

## 2016-11-26 DIAGNOSIS — I480 Paroxysmal atrial fibrillation: Secondary | ICD-10-CM | POA: Diagnosis present

## 2016-11-26 DIAGNOSIS — I5033 Acute on chronic diastolic (congestive) heart failure: Secondary | ICD-10-CM | POA: Diagnosis not present

## 2016-11-26 DIAGNOSIS — I509 Heart failure, unspecified: Secondary | ICD-10-CM

## 2016-11-26 DIAGNOSIS — E785 Hyperlipidemia, unspecified: Secondary | ICD-10-CM

## 2016-11-26 DIAGNOSIS — D631 Anemia in chronic kidney disease: Secondary | ICD-10-CM

## 2016-11-26 DIAGNOSIS — N179 Acute kidney failure, unspecified: Secondary | ICD-10-CM | POA: Diagnosis present

## 2016-11-26 DIAGNOSIS — Z79899 Other long term (current) drug therapy: Secondary | ICD-10-CM | POA: Diagnosis not present

## 2016-11-26 DIAGNOSIS — Z8249 Family history of ischemic heart disease and other diseases of the circulatory system: Secondary | ICD-10-CM | POA: Diagnosis not present

## 2016-11-26 LAB — TROPONIN I
TROPONIN I: 0.04 ng/mL — AB (ref <0.03)
Troponin I: 0.04 ng/mL (ref <0.03)

## 2016-11-26 LAB — BASIC METABOLIC PANEL
ANION GAP: 8 (ref 5–15)
BUN: 44 mg/dL — ABNORMAL HIGH (ref 6–20)
CHLORIDE: 103 mmol/L (ref 101–111)
CO2: 28 mmol/L (ref 22–32)
Calcium: 9.6 mg/dL (ref 8.9–10.3)
Creatinine, Ser: 1.5 mg/dL — ABNORMAL HIGH (ref 0.61–1.24)
GFR calc non Af Amer: 39 mL/min — ABNORMAL LOW (ref >60)
GFR, EST AFRICAN AMERICAN: 45 mL/min — AB (ref >60)
Glucose, Bld: 145 mg/dL — ABNORMAL HIGH (ref 65–99)
POTASSIUM: 3.3 mmol/L — AB (ref 3.5–5.1)
Sodium: 139 mmol/L (ref 135–145)

## 2016-11-26 LAB — ECHOCARDIOGRAM COMPLETE
Height: 70 in
WEIGHTICAEL: 3276.8 [oz_av]

## 2016-11-26 LAB — GLUCOSE, CAPILLARY
GLUCOSE-CAPILLARY: 110 mg/dL — AB (ref 65–99)
Glucose-Capillary: 178 mg/dL — ABNORMAL HIGH (ref 65–99)
Glucose-Capillary: 139 mg/dL — ABNORMAL HIGH (ref 65–99)
Glucose-Capillary: 150 mg/dL — ABNORMAL HIGH (ref 65–99)

## 2016-11-26 MED ORDER — WARFARIN - PHARMACIST DOSING INPATIENT
Freq: Every day | Status: DC
  Administered 2016-11-26: 1

## 2016-11-26 MED ORDER — WARFARIN SODIUM 5 MG PO TABS
5.0000 mg | ORAL_TABLET | Freq: Once | ORAL | Status: AC
  Administered 2016-11-26: 5 mg via ORAL

## 2016-11-26 MED ORDER — POTASSIUM CHLORIDE CRYS ER 20 MEQ PO TBCR
20.0000 meq | EXTENDED_RELEASE_TABLET | Freq: Two times a day (BID) | ORAL | Status: DC
  Administered 2016-11-26 – 2016-11-27 (×3): 20 meq via ORAL
  Filled 2016-11-26 (×3): qty 1

## 2016-11-26 NOTE — Consult Note (Signed)
Cardiology Consult    Patient ID: Daniel Cooke MRN: [id number], DOB/AGE: July 18, 1924   Admit date: 11/25/2016 Date of Consult: 11/26/2016  Primary Physician: Hollice Espy, MD Primary Cardiologist: Carleene Mains, MD  Requesting Provider: C. Rama  Patient Profile    82 y/o ? with a h/o Paroxysmal Afib, HTN, HL, DM, BPH, and OA, who was admitted 12/13 with progressive dyspnea and edema.  Past Medical History   Past Medical History:  Diagnosis Date  . Arthritis    "was in left knee before replacement; now have it in my right knee" (11/25/2016)  . BPH (benign prostatic hypertrophy)   . Chronic diastolic CHF (congestive heart failure) (HCC)    a. 02/2016 Echo: EF 60-65%, no rwma, triv AI, mild MR, mildly to mod dil LA, mod dil RA, PASP .  . Essential hypertension   . Hard of hearing    wears bilateral hearing aids  . Hypercholesterolemia   . Osteoarthritis   . PAF (paroxysmal atrial fibrillation) (HCC)    a. CHADS2VASC score of 4 --> coumadin.  . Pilonidal cyst    PAST HX - NO PROBLEM NOW  . Pneumonia    "one time; years ago" (11/25/2016)  . Shingles    "long time ago"  . Type II diabetes mellitus (HCC)    oral meds - no insulin    Past Surgical History:  Procedure Laterality Date  . APPENDECTOMY    . CATARACT EXTRACTION W/ INTRAOCULAR LENS IMPLANT Left 10/2016  . CHOLECYSTECTOMY OPEN    . COLONOSCOPY W/ BIOPSIES  08/2005   [name] 04/27/2011  . EYE SURGERY Right    "right" traumatic cataract removed--injury to the eye--states his eyesight is ok in left eye  . JOINT REPLACEMENT    . KNEE ARTHROSCOPY Left 02/28/2014   Procedure: ARTHROSCOPY LEFT KNEE WITH SYNOVECTOMY;  Surgeon: [name], MD;  Location: [facility] OR[facility];  Service: Orthopedics;  Laterality: Left;  . SHOULDER OPEN ROTATOR CUFF REPAIR Left   . TONSILLECTOMY    . TOTAL KNEE ARTHROPLASTY  02/08/2012   Procedure: TOTAL KNEE ARTHROPLASTY;  Surgeon: [name], MD;  Location: [facility];  Service:  Orthopedics;  Laterality: Left;  Marland Kitchen VASECTOMY       Allergies  Allergies  Allergen Reactions  . Codeine Nausea And Vomiting and Other (See Comments)    MAKES ME CRAZY  . Oxycodone Nausea And Vomiting and Other (See Comments)    MAKES ME CRAZY    History of Present Illness    82 y/o ? with a h/o AF (persistent since at least 01/2016), HTN, HL, DM, BPH, OA, and vertebral fracture s/p vertebroplasty earlier this year .  He was dx with Afib @ that time and had some dyspnea.  He was eval by Dr. Elberta Fortis and was well rate-controlled.  He has been on coumadin and plan was for cardioversion in Feb/March, however he ended up cancelling it as he was feeling well.  He has remained in afib over the year and last saw Dr. Elberta Fortis on 12/4.  Pt says that he has been getting progressively more dyspneic with lower ext and scrotal edema.  At his office visit, recommendation was for DCCV.  Pt was scheduled for epidural injection on 12/12 and had to come off of coumadin on 12/6 in preparation.  As a result, it was felt that he could have DCCV after resumption of coumadin and 3 wks of therapeutic INR, likely in early January.    Unfortunately, Daniel Cooke  has continued to have worsening of dyspnea and edema.  His wt climbed from a baseline of 191-192 in June to 216 12/4.  He went ahead with his epidural on 12/12 and was advised to f/u with his PCP 2/2 dyspnea and edema.  He did so on 12/13 and was initially referred to urology and [facility].  There, BNP was elevated @ 1068 and trop was mildly elevated @ 0.04.  [facility] and he was transferred here for medicine admission.  Since admission he has diuresed a net negative of 4L and his wt is down to 205 lbs.  He continues to have volume overload.  He remains in Afib though is well rate-controlled.  Inpatient Medications    . furosemide  40 mg Intravenous Q12H  . insulin aspart  0-20 Units Subcutaneous TID WC  . insulin aspart   0-5 Units Subcutaneous QHS  . lisinopril  20 mg Oral QHS  . potassium chloride  20 mEq Oral BID  . simvastatin  20 mg Oral QHS  . sodium chloride flush  3 mL Intravenous Q12H  . terazosin  5 mg Oral QHS  . warfarin  5 mg Oral q1800  . Warfarin - Physician Dosing Inpatient   Does not apply 49q1800    Family History    Family History  Problem Relation Age of Onset  . Heart disease Mother   . Heart disease Brother   . Heart attack Brother     Social History    Social History   Social History  . Marital status: Married    Spouse name: N/A  . Number of children: N/A  . Years of education: N/A   Occupational History  . Not on file.   Social History Main Topics  . Smoking status: Never Smoker  . Smokeless tobacco: Never Used  . Alcohol use No  . Drug use: No  . Sexual activity: Not on file   Other Topics Concern  . Not on file   Social History Narrative  . No narrative on file     Review of Systems    General:  No chills, fever, night sweats or weight changes.  Cardiovascular:  No chest pain, +++ dyspnea on exertion, +++ LE and scrotal edema, +++ orthopnea, no palpitations, paroxysmal nocturnal dyspnea. Dermatological: No rash, lesions/masses Respiratory: No cough, +++ dyspnea Urologic: No hematuria, dysuria Abdominal:   No nausea, vomiting, diarrhea, bright red blood per rectum, melena, or hematemesis Neurologic:  No visual changes, wkns, changes in mental status. All other systems reviewed and are otherwise negative except as noted above.  Physical Exam    Blood pressure 136/64, pulse 66, temperature 97.7 F (36.5 C), temperature source Oral, resp. rate 18, height 5\' 10"  (1.778 m), weight 204 lb 12.8 oz (92.9 kg), SpO2 96 %.  General: Pleasant, NAD. Psych: Normal affect. Neuro: Alert and oriented X 3. Moves all extremities spontaneously. HEENT: Normal  Neck: Supple without bruits.  JVP to jaw. Lungs:  Resp regular and unlabored, CTA. Heart: IR, IR, no s3,  s4, or murmurs. Abdomen: Soft, non-tender, non-distended, BS + x 4.  Extremities: No clubbing, cyanosis.  2+ bilat ankle edema. DP/PT/Radials 2+ and equal bilaterally.   Labs    Troponin [facility](Point of Care Test)  Recent Labs  11/25/16 1451  TROPIPOC 0.00    Recent Labs  11/25/16 2121 11/26/16 0321 11/26/16 0901  TROPONINI 0.04* 0.04* 0.04*   Lab Results  Component Value Date  WBC 8.2 11/25/2016   HGB 9.1 (L) 11/25/2016   HCT 27.8 (L) 11/25/2016   MCV 96.2 11/25/2016   PLT 188 11/25/2016     Recent Labs Lab 11/26/16 0321  NA 139  K 3.3*  CL 103  CO2 28  BUN 44*  CREATININE 1.50*  CALCIUM 9.6  GLUCOSE 145*    Radiology Studies    Dg Chest 2 View  Result Date: 11/25/2016 CLINICAL DATA:  Shortness of breath for 3-4 weeks intermittently, urinary frequency EXAM: CHEST  2 VIEW COMPARISON:  Chest x-ray of 02/22/2014 FINDINGS: There is cardiomegaly present with mild pulmonary vascular congestion and small effusions consistent with mild congestive heart failure. No pneumonia is seen. There are degenerative changes in the lower thoracic spine. IMPRESSION: Mild CHF with small effusions. Electronically Signed   By: Dwyane DeePaul  Barry M.D.   On: 11/25/2016 15:33    ECG & Cardiac Imaging    Afib, 67, RAD, RVH, non-specific st/t changes.  No acute changes.  Assessment & Plan    1.  Acute on chronic diastolic chf:  Pt presented to the ED on 12/13 with a 3-4 wk h/o progressive dyspnea, lower ext edema, increasing abd girth, orthopnea, and scrotal edema.  BNP was over 1000 in the ED.  He denies any recent change in diet - wife cooks w/o salt.  He is in afib, though this is well rate controlled.  BP stable.  Echo earlier today shows nl LV fxn.  Suspect that he doesn't tolerate ongoing Afib and loss of atrial kick despite rate control.  He has been diuresing well and is minus 4L since admission.  Wt is down from a peak of 216 on 12/4 to 205 today.  His dry wt was in the low 190's over the  summer.  He continues to have volume overload with JVD to the jaw, LE, scrotal edema, and distended abd.  Cont current dose of IV lasix.  He will need oral lasix as an outpt, at least until we can perform DCCV.  2.  Persistent Atrial Fibrillation:  First noted in early 2017.  He has been rate controlled and recently noted worsening of DOE, edema, etc as above.  He saw Dr. Elberta Fortisamnitz on 12/4 with plan for DCCV after back injection and 3 wks of therapeutic anticoagulation on coumadin.  His injection took place the 12th and coumadin was resumed 12/13.  As above, it appears likely that he does not tolerate afib, despite good rate control.  Diurese as above and stick with plan for DCCV following three wks of therapeutic INR's.  Would not pursue TEE/DCCV sooner if we don't have to.  Note, he is not on an AVN blocking agent.  3.  Elevated troponin:  Mild trop elevation with flat trend (0.04 x 3).  No chest pain.  Suspect demand ischemia in setting of CHF.  Echo showed nl EF w/o rwma.  Would not pursue further ischemic eval @ this time.  4.  CKD III:  Creat stable. Follow with ongoing diuresis.  5.  Hypokalemia:  supp.   Signed, Nicolasa Duckinghristopher Berge, NP 11/26/2016, 3:28 PM   I have examined the patient and reviewed assessment and plan and discussed with patient.  Agree with above as stated.  Continue diuresis.  He needs another back injection in January.  THis would delay potential cardioversion of AFib as well.  Resume Coumadin.  No heparin due to recent injection.  No ischemic eval.   Lance MussJayadeep Patryck Kilgore

## 2016-11-26 NOTE — Progress Notes (Signed)
Progress Note    Daniel Cooke  ZOX:[id number] DOB: Jun 12, 1924  DOA: 11/25/2016 PCP: Hollice EspyGATES,DONNA RUTH, MD    Brief Narrative:   Chief complaint: Follow-up CHF  Daniel Cooke is an 82 y.o. male with a PMH of chronic atrial fibrillation with a CHADS-Vasc score of 4, on chronic Coumadin, hypertension, hyperlipidemia, diabetes (uncertain long time control) who was admitted 11/25/16 after being advised to come to the ED by his urologist due to progressive weight gain and scrotal swelling. Chest x-ray on admission showed mild CHF with small effusions.  Assessment/Plan:   Principal Problem:   Acute Diastolic CHF 2-D echo done, EF 60-65 percent with no regional wall motion abnormalities. Suspect decompensation secondary to rapid A. fib in the setting of recent neurosurgical procedure (was noted to have an RVR at that time). Family requests cardiology consultation which was called this morning. Continue Lasix 40 mg every 12 hours. I/O balance -4 L. Weight down almost 7 pounds.  Active Problems:   Atrial fibrillation (HCC) [I48.91] Rate controlled with heart rate in the 60s-70s. Not on rate controlling medications at baseline. Continue Coumadin. INR not yet therapeutic, but Coumadin had been held in anticipation of neurosurgical procedure.    Stage III CKD (chronic kidney disease) Baseline creatinine is around 1.5. Current creatinine consistent with usual baseline values.    Anemia of chronic kidney disease Hemoglobin stable with no indication for transfusion.    HTN (hypertension) Blood pressure currently controlled. Continue Lasix and lisinopril.    HLD (hyperlipidemia) Continue simvastatin.    Diabetes (HCC) with complications Check hemoglobin A1c. Currently being managed with insulin resistant SSI. CBGs 110-178.    Hypokalemia Placed on potassium supplementation.   Family Communication/Anticipated D/C date and plan/Code Status   DVT prophylaxis: Coumadin  ordered. Code Status: Full Code.  Family Communication: No family at the bedside. Disposition Plan: Likely will be stable for discharge in the next 24 hours.   Medical Consultants:    Cardiology   Procedures:    2-D echo 11/26/16: Normal EF. No regional wall motion abnormalities.  Anti-Infectives:    None  Subjective:   The patient reports that He feels better and that his breathing is better. Review of symptoms is positive for mild back pain and negative for chest pain, nausea, vomiting, palpitations.  Objective:    Vitals:   11/25/16 2120 11/26/16 0012 11/26/16 0518 11/26/16 1126  BP: (!) 145/62 (!) 137/58 133/64 136/64  Pulse: 69 65 71 66  Resp: 20 20 20 18   Temp: 98 F (36.7 C) 98.1 F (36.7 C) 97.9 F (36.6 C) 97.7 F (36.5 C)  TempSrc: Oral Oral Oral Oral  SpO2: 98% 99% 96% 96%  Weight: 95.9 kg (211 lb 6.4 oz)  92.9 kg (204 lb 12.8 oz)   Height: 5\' 10"  (1.778 m)       Intake/Output Summary (Last 24 hours) at 11/26/16 1411 Last data filed at 11/26/16 1314  Gross per 24 hour  Intake              480 ml  Output             4535 ml  Net            -4055 ml   Filed Weights   11/25/16 2120 11/26/16 0518  Weight: 95.9 kg (211 lb 6.4 oz) 92.9 kg (204 lb 12.8 oz)    Exam: General exam: Appears calm and comfortable.  Respiratory system: Diminished. Respiratory effort mildly increased.  Cardiovascular system: S1 & S2 heard, RRR. No JVD,  rubs, gallops or clicks. No murmurs. Gastrointestinal system: Abdomen is nondistended, soft and nontender. No organomegaly or masses felt. Normal bowel sounds heard. Central nervous system: Alert and oriented 2. No focal neurological deficits. Extremities: No clubbing,  or cyanosis. 3+ edema. Skin: No rashes, lesions or ulcers. Psychiatry: Judgement and insight appear mildly impaired. Mood & affect appropriate.   Data Reviewed:   I have personally reviewed following labs and imaging studies:  Labs: Basic Metabolic  Panel:  Recent Labs Lab 11/25/16 1437 11/26/16 0321  NA 142 139  K 3.8 3.3*  CL 107 103  CO2 25 28  GLUCOSE 220* 145*  BUN 49* 44*  CREATININE 1.56* 1.50*  CALCIUM 9.4 9.6   GFR Estimated Creatinine Clearance: 37.5 mL/min (by C-G formula based on SCr of 1.5 mg/dL (H)). Coagulation profile  Recent Labs Lab 11/25/16 1448  INR 1.27    CBC:  Recent Labs Lab 11/25/16 1437  WBC 8.2  HGB 9.1*  HCT 27.8*  MCV 96.2  PLT 188   Cardiac Enzymes:  Recent Labs Lab 11/25/16 2121 11/26/16 0321 11/26/16 0901  TROPONINI 0.04* 0.04* 0.04*   CBG:  Recent Labs Lab 11/25/16 2127 11/26/16 0547 11/26/16 1129  GLUCAP 162* 110* 178*   Hgb A1c: No results for input(s): HGBA1C in the last 72 hours.  Microbiology No results found for this or any previous visit (from the past 240 hour(s)).  Radiology: Dg Chest 2 View  Result Date: 11/25/2016 CLINICAL DATA:  Shortness of breath for 3-4 weeks intermittently, urinary frequency EXAM: CHEST  2 VIEW COMPARISON:  Chest x-ray of 02/22/2014 FINDINGS: There is cardiomegaly present with mild pulmonary vascular congestion and small effusions consistent with mild congestive heart failure. No pneumonia is seen. There are degenerative changes in the lower thoracic spine. IMPRESSION: Mild CHF with small effusions. Electronically Signed   By: Dwyane DeePaul  Barry M.D.   On: 11/25/2016 15:33    Medications:   . furosemide  40 mg Intravenous Q12H  . insulin aspart  0-20 Units Subcutaneous TID WC  . insulin aspart  0-5 Units Subcutaneous QHS  . lisinopril  20 mg Oral QHS  . simvastatin  20 mg Oral QHS  . sodium chloride flush  3 mL Intravenous Q12H  . terazosin  5 mg Oral QHS  . warfarin  5 mg Oral q1800  . Warfarin - Physician Dosing Inpatient   Does not apply q1800   Continuous Infusions:  High complexity decision-making with multiple data points to review. Level III visit.    LOS: 0 days   RAMA,CHRISTINA  [facility] Pager  [phone]. If unable to reach me by pager, please call my cell phone at [phone].  *Please refer to [email or web address], password [email or web address] to get updated schedule on who will round on this patient, as hospitalists switch teams weekly. If 7PM-7AM, please contact night-coverage at [email or web address], password [email or web address] for any overnight needs.  11/26/2016, 2:11 PM

## 2016-11-26 NOTE — Progress Notes (Signed)
ANTICOAGULATION CONSULT NOTE - Initial Consult  Pharmacy Consult for Warfarin Indication: atrial fibrillation  Allergies  Allergen Reactions  . Codeine Nausea And Vomiting and Other (See Comments)    MAKES ME CRAZY  . Oxycodone Nausea And Vomiting and Other (See Comments)    MAKES ME CRAZY    Patient Measurements: Height: 5\' 10"  (177.8 cm) Weight: 204 lb 12.8 oz (92.9 kg) IBW/kg (Calculated) : 73  Vital Signs: Temp: 97.7 F (36.5 C) (12/14 1126) Temp Source: Oral (12/14 1126) BP: 136/64 (12/14 1126) Pulse Rate: 66 (12/14 1126)  Labs:  Recent Labs  11/25/16 1437 11/25/16 1448 11/25/16 2121 11/26/16 0321 11/26/16 0901  HGB 9.1*  --   --   --   --   HCT 27.8*  --   --   --   --   PLT 188  --   --   --   --   LABPROT  --  15.9*  --   --   --   INR  --  1.27  --   --   --   CREATININE 1.56*  --   --  1.50*  --   TROPONINI  --   --  0.04* 0.04* 0.04*    Estimated Creatinine Clearance: 37.5 mL/min (by C-G formula based on SCr of 1.5 mg/dL (H)).   Medical History: Past Medical History:  Diagnosis Date  . Arthritis    "was in left knee before replacement; now have it in my right knee" (11/25/2016)  . BPH (benign prostatic hypertrophy)   . Chronic diastolic CHF (congestive heart failure) (HCC)    a. 02/2016 Echo: EF 60-65%, no rwma, triv AI, mild MR, mildly to mod dil LA, mod dil RA, PASP 71mmHg.  . Essential hypertension   . Hard of hearing    wears bilateral hearing aids  . Hypercholesterolemia   . Osteoarthritis   . PAF (paroxysmal atrial fibrillation) (HCC)    a. CHADS2VASC score of 4 --> coumadin.  . Pilonidal cyst    PAST HX - NO PROBLEM NOW  . Pneumonia    "one time; years ago" (11/25/2016)  . Shingles    "long time ago"  . Type II diabetes mellitus (HCC)    oral meds - no insulin    Medications:  Prescriptions Prior to Admission  Medication Sig Dispense Refill Last Dose  . acetaminophen (TYLENOL) 325 MG tablet Take 325 mg by mouth every 6 (six)  hours as needed for mild pain.   Past Month at Unknown time  . calcium carbonate (OSCAL) 1500 (600 Ca) MG TABS tablet Take 1 tablet by mouth 2 (two) times daily with a meal.   11/25/2016 at Unknown time  . Cyanocobalamin (VITAMIN B 12 PO) Take by mouth daily.   11/25/2016 at Unknown time  . glipiZIDE (GLUCOTROL) 10 MG tablet Take 10 mg by mouth every evening.    Past Week at Unknown time  . lisinopril (PRINIVIL,ZESTRIL) 20 MG tablet Take 20 mg by mouth at bedtime.   11/25/2016 at Unknown time  . metFORMIN (GLUCOPHAGE) 1000 MG tablet Take 1,000 mg by mouth 2 (two) times daily with a meal.   11/25/2016 at Unknown time  . Multiple Vitamin (MULTIVITAMIN WITH MINERALS) TABS tablet Take 1 tablet by mouth daily.   11/25/2016 at Unknown time  . pioglitazone (ACTOS) 15 MG tablet Take 15 mg by mouth at bedtime.   11/24/2016 at Unknown time  . simvastatin (ZOCOR) 20 MG tablet Take 20 mg by mouth  at bedtime.   11/24/2016 at Unknown time  . terazosin (HYTRIN) 5 MG capsule Take 5 mg by mouth at bedtime.   11/24/2016 at Unknown time  . warfarin (COUMADIN) 5 MG tablet Take as directed by coumadin clinic 50 tablet 2 11/24/2016 at 9pm   Scheduled:  . furosemide  40 mg Intravenous Q12H  . insulin aspart  0-20 Units Subcutaneous TID WC  . insulin aspart  0-5 Units Subcutaneous QHS  . lisinopril  20 mg Oral QHS  . potassium chloride  20 mEq Oral BID  . simvastatin  20 mg Oral QHS  . sodium chloride flush  3 mL Intravenous Q12H  . terazosin  5 mg Oral QHS   Infusions:   PRN: sodium chloride, acetaminophen, ondansetron (ZOFRAN) IV, sodium chloride flush Anti-infectives    None      Assessment: Patient is a 82 yo male admitted 11/25/16 for progressive weight gain. He was on chronic warfarin for atrial fibrillation (CHADS-VASC=4). Warfarin was on hold since 12/6 for epidural injection, and was restarted 12/13 without bridge.   INR is subtherapeutic at 1.27, as expected after held doses. Hemoglobin was low at  9.1 (stable from past CBCs) and platelet count was within normal limits. No bleeding noted.   PTA Dose: MWFSS 7.5mg , TTh 5mg  per [facility] tracker and confirmed with patient and his wife.  Will give patient dose consistent with home dose and monitor.   Goal of Therapy:  INR 2-3 Monitor platelets by anticoagulation protocol: Yes   Plan:  Warfarin 5mg  PO x 1 tonight Monitor daily INRs and signs of bleeding  Carylon PerchesMaggie Shuda, PharmD Acute Care Pharmacy Resident  Pager: [phone] 11/26/2016

## 2016-11-26 NOTE — Progress Notes (Signed)
  Echocardiogram 2D Echocardiogram has been performed.  [name], Daniel Cooke R 11/26/2016, 10:11 AM

## 2016-11-27 ENCOUNTER — Encounter (HOSPITAL_COMMUNITY): Payer: Self-pay | Admitting: Internal Medicine

## 2016-11-27 DIAGNOSIS — I5033 Acute on chronic diastolic (congestive) heart failure: Secondary | ICD-10-CM | POA: Diagnosis present

## 2016-11-27 DIAGNOSIS — D638 Anemia in other chronic diseases classified elsewhere: Secondary | ICD-10-CM

## 2016-11-27 LAB — BASIC METABOLIC PANEL
ANION GAP: 9 (ref 5–15)
BUN: 42 mg/dL — AB (ref 6–20)
CALCIUM: 10.1 mg/dL (ref 8.9–10.3)
CO2: 32 mmol/L (ref 22–32)
Chloride: 101 mmol/L (ref 101–111)
Creatinine, Ser: 1.55 mg/dL — ABNORMAL HIGH (ref 0.61–1.24)
GFR calc Af Amer: 44 mL/min — ABNORMAL LOW (ref >60)
GFR calc non Af Amer: 38 mL/min — ABNORMAL LOW (ref >60)
GLUCOSE: 115 mg/dL — AB (ref 65–99)
POTASSIUM: 3.6 mmol/L (ref 3.5–5.1)
Sodium: 142 mmol/L (ref 135–145)

## 2016-11-27 LAB — GLUCOSE, CAPILLARY
GLUCOSE-CAPILLARY: 101 mg/dL — AB (ref 65–99)
Glucose-Capillary: 215 mg/dL — ABNORMAL HIGH (ref 65–99)

## 2016-11-27 LAB — HEMOGLOBIN A1C
HEMOGLOBIN A1C: 7 % — AB (ref 4.8–5.6)
Mean Plasma Glucose: 154 mg/dL

## 2016-11-27 LAB — PROTIME-INR
INR: 1.4
Prothrombin Time: 17.2 seconds — ABNORMAL HIGH (ref 11.4–15.2)

## 2016-11-27 MED ORDER — FUROSEMIDE 40 MG PO TABS: 40.0000 mg | ORAL_TABLET | Freq: Every day | ORAL | 2 refills | Status: DC

## 2016-11-27 MED ORDER — POTASSIUM CHLORIDE CRYS ER 20 MEQ PO TBCR: 20.0000 meq | EXTENDED_RELEASE_TABLET | Freq: Every day | ORAL | 2 refills | Status: DC

## 2016-11-27 MED ORDER — WARFARIN SODIUM 7.5 MG PO TABS: 7.5000 mg | ORAL_TABLET | Freq: Once | ORAL | Status: DC

## 2016-11-27 NOTE — Progress Notes (Signed)
ANTICOAGULATION CONSULT NOTE - Initial Consult  Pharmacy Consult for Warfarin Indication: atrial fibrillation  Allergies  Allergen Reactions  . Codeine Nausea And Vomiting and Other (See Comments)    MAKES ME CRAZY  . Oxycodone Nausea And Vomiting and Other (See Comments)    MAKES ME CRAZY    Patient Measurements: Height: 5\' 10"  (177.8 cm) Weight: 191 lb 8 oz (86.9 kg) (scale a) IBW/kg (Calculated) : 73  Vital Signs: Temp: 98.5 F (36.9 C) (12/15 0529) Temp Source: Oral (12/15 0529) BP: 126/61 (12/15 0529) Pulse Rate: 76 (12/15 0529)  Labs:  Recent Labs  11/25/16 1437 11/25/16 1448 11/25/16 2121 11/26/16 0321 11/26/16 0901 11/27/16 0327  HGB 9.1*  --   --   --   --   --   HCT 27.8*  --   --   --   --   --   PLT 188  --   --   --   --   --   LABPROT  --  15.9*  --   --   --  17.2*  INR  --  1.27  --   --   --  1.40  CREATININE 1.56*  --   --  1.50*  --  1.55*  TROPONINI  --   --  0.04* 0.04* 0.04*  --     Estimated Creatinine Clearance: 32.7 mL/min (by C-G formula based on SCr of 1.55 mg/dL (H)).   Medical History: Past Medical History:  Diagnosis Date  . Arthritis    "was in left knee before replacement; now have it in my right knee" (11/25/2016)  . BPH (benign prostatic hypertrophy)   . Chronic diastolic CHF (congestive heart failure) (HCC)    a. 02/2016 Echo: EF 60-65%, no rwma, triv AI, mild MR, mildly to mod dil LA, mod dil RA, PASP 71mmHg.  . Essential hypertension   . Hard of hearing    wears bilateral hearing aids  . Hypercholesterolemia   . Osteoarthritis   . PAF (paroxysmal atrial fibrillation) (HCC)    a. CHADS2VASC score of 4 --> coumadin.  . Pilonidal cyst    PAST HX - NO PROBLEM NOW  . Pneumonia    "one time; years ago" (11/25/2016)  . Shingles    "long time ago"  . Type II diabetes mellitus (HCC)    oral meds - no insulin    Medications:  Prescriptions Prior to Admission  Medication Sig Dispense Refill Last Dose  . acetaminophen  (TYLENOL) 325 MG tablet Take 325 mg by mouth every 6 (six) hours as needed for mild pain.   Past Month at Unknown time  . calcium carbonate (OSCAL) 1500 (600 Ca) MG TABS tablet Take 1 tablet by mouth 2 (two) times daily with a meal.   11/25/2016 at Unknown time  . Cyanocobalamin (VITAMIN B 12 PO) Take by mouth daily.   11/25/2016 at Unknown time  . glipiZIDE (GLUCOTROL) 10 MG tablet Take 10 mg by mouth every evening.    Past Week at Unknown time  . lisinopril (PRINIVIL,ZESTRIL) 20 MG tablet Take 20 mg by mouth at bedtime.   11/25/2016 at Unknown time  . metFORMIN (GLUCOPHAGE) 1000 MG tablet Take 1,000 mg by mouth 2 (two) times daily with a meal.   11/25/2016 at Unknown time  . Multiple Vitamin (MULTIVITAMIN WITH MINERALS) TABS tablet Take 1 tablet by mouth daily.   11/25/2016 at Unknown time  . pioglitazone (ACTOS) 15 MG tablet Take 15 mg by mouth at  bedtime.   11/24/2016 at Unknown time  . simvastatin (ZOCOR) 20 MG tablet Take 20 mg by mouth at bedtime.   11/24/2016 at Unknown time  . terazosin (HYTRIN) 5 MG capsule Take 5 mg by mouth at bedtime.   11/24/2016 at Unknown time  . warfarin (COUMADIN) 5 MG tablet Take as directed by coumadin clinic 50 tablet 2 11/24/2016 at 9pm   Scheduled:  . furosemide  40 mg Intravenous Q12H  . insulin aspart  0-20 Units Subcutaneous TID WC  . insulin aspart  0-5 Units Subcutaneous QHS  . lisinopril  20 mg Oral QHS  . potassium chloride  20 mEq Oral BID  . simvastatin  20 mg Oral QHS  . sodium chloride flush  3 mL Intravenous Q12H  . terazosin  5 mg Oral QHS  . Warfarin - Pharmacist Dosing Inpatient   Does not apply q1800   Infusions:   PRN: sodium chloride, acetaminophen, ondansetron (ZOFRAN) IV, sodium chloride flush Anti-infectives    None      Assessment: Patient is a 82 yo male admitted 11/25/16 for progressive weight gain. He was on chronic warfarin for atrial fibrillation (CHADS-VASC=4). Warfarin was on hold since 12/6 for epidural injection,  and was restarted 12/13 without bridge.   INR is subtherapeutic at 1.40, as expected after held doses. Last hemoglobin was low at 9.1 (stable from past CBCs) and platelet count was within normal limits. No bleeding noted.   PTA Dose: MWFSS 7.5mg , TTh [facility]  [facility]  Will give patient dose consistent with home dose and monitor.   Goal of Therapy:  INR 2-3 Monitor platelets by anticoagulation protocol: Yes   Plan:  Warfarin 7.5mg  PO x 1 tonight Monitor daily INRs and signs of bleeding  Carylon PerchesMaggie Shuda, PharmD Acute Care Pharmacy Resident  Pager: [phone] 11/27/2016

## 2016-11-27 NOTE — Discharge Instructions (Addendum)
° °Heart Failure °Heart failure is a condition in which the heart has trouble pumping blood because it has become weak or stiff. This means that the heart does not pump blood efficiently for the body to work well. For some people with heart failure, fluid may back up into the lungs and there may be swelling (edema) in the lower legs. Heart failure is usually a long-term (chronic) condition. It is important for you to take good care of yourself and follow the treatment plan from your health care provider. °What are the causes? °This condition is caused by some health problems, including: °· High blood pressure (hypertension). Hypertension causes the heart muscle to work harder than normal. High blood pressure eventually causes the heart to become stiff and weak. °· Coronary artery disease (CAD). CAD is the buildup of cholesterol and fat (plaques) in the arteries of the heart. °· Heart attack (myocardial infarction). Injured tissue, which is caused by the heart attack, does not contract as well and the heart's ability to pump blood is weakened. °· Abnormal heart valves. When the heart valves do not open and close properly, the heart muscle must pump harder to keep the blood flowing. °· Heart muscle disease (cardiomyopathy or myocarditis). Heart muscle disease is damage to the heart muscle from a variety of causes, such as drug or alcohol abuse, infections, or unknown causes. These can increase the risk of heart failure. °· Lung disease. When the lungs do not work properly, the heart must work harder. °What increases the risk? °Risk of heart failure increases as a person ages. This condition is also more likely to develop in people who: °· Are overweight. °· Are male. °· Smoke or chew tobacco. °· Abuse alcohol or illegal drugs. °· Have taken medicines that can damage the heart, such as chemotherapy drugs. °· Have diabetes. °¨ High blood sugar (glucose) is associated with high fat (lipid) levels in the  blood. °¨ Diabetes can also damage tiny blood vessels that carry nutrients to the heart muscle. °· Have abnormal heart rhythms. °· Have thyroid problems. °· Have low blood counts (anemia). °What are the signs or symptoms? °Symptoms of this condition include: °· Shortness of breath with activity, such as when climbing stairs. °· Persistent cough. °· Swelling of the feet, ankles, legs, or abdomen. °· Unexplained weight gain. °· Difficulty breathing when lying flat (orthopnea). °· Waking from sleep because of the need to sit up and get more air. °· Rapid heartbeat. °· Fatigue and loss of energy. °· Feeling light-headed, dizzy, or close to fainting. °· Loss of appetite. °· Nausea. °· Increased urination during the night (nocturia). °· Confusion. °How is this diagnosed? °This condition is diagnosed based on: °· Medical history, symptoms, and a physical exam. °· Diagnostic tests, which may include: °¨ Echocardiogram. °¨ Electrocardiogram (ECG). °¨ Chest X-ray. °¨ Blood tests. °¨ Exercise stress test. °¨ Radionuclide scans. °¨ Cardiac catheterization and angiogram. °How is this treated? °Treatment for this condition is aimed at managing the symptoms of heart failure. Medicines, behavioral changes, or other treatments may be necessary to treat heart failure. °Medicines  °These may include: °· Angiotensin-converting enzyme (ACE) inhibitors. This type of medicine blocks the effects of a blood protein called angiotensin-converting enzyme. ACE inhibitors relax (dilate) the blood vessels and help to lower blood pressure. °· Angiotensin receptor blockers (ARBs). This type of medicine blocks the actions of a blood protein called angiotensin. ARBs dilate the blood vessels and help to lower blood pressure. °· Water pills (diuretics).   Diuretics cause the kidneys to remove salt and water from the blood. The extra fluid is removed through urination, leaving a lower volume of blood that the heart has to pump. °· Beta blockers. These  improve heart muscle strength and they prevent the heart from beating too quickly. °· Digoxin. This increases the force of the heartbeat. °Healthy behavior changes  °These may include: °· Reaching and maintaining a healthy weight. °· Stopping smoking or chewing tobacco. °· Eating heart-healthy foods. °· Limiting or avoiding alcohol. °· Stopping use of street drugs (illegal drugs). °· Physical activity. °Other treatments  °These may include: °· Surgery to open blocked coronary arteries or repair damaged heart valves. °· Placement of a biventricular pacemaker to improve heart muscle function (cardiac resynchronization therapy). This device paces both the right ventricle and left ventricle. °· Placement of a device to treat serious abnormal heart rhythms (implantable cardioverter defibrillator, or ICD). °· Placement of a device to improve the pumping ability of the heart (left ventricular assist device, or LVAD). °· Heart transplant. This can cure heart failure, and it is considered for certain patients who do not improve with other therapies. °Follow these instructions at home: °Medicines °· Take over-the-counter and prescription medicines only as told by your health care provider. Medicines are important in reducing the workload of your heart, slowing the progression of heart failure, and improving your symptoms. °¨ Do not stop taking your medicine unless your health care provider told you to do that. °¨ Do not skip any dose of medicine. °¨ Refill your prescriptions before you run out of medicine. You need your medicines every day. °Eating and drinking °· Eat heart-healthy foods. Talk with a dietitian to make an eating plan that is right for you. °¨ Choose foods that contain no trans fat and are low in saturated fat and cholesterol. Healthy choices include fresh or frozen fruits and vegetables, fish, lean meats, legumes, fat-free or low-fat dairy products, and whole-grain or high-fiber foods. °¨ Limit salt (sodium)  if directed by your health care provider. Sodium restriction may reduce symptoms of heart failure. Ask a dietitian to recommend heart-healthy seasonings. °¨ Use healthy cooking methods instead of frying. Healthy methods include roasting, grilling, broiling, baking, poaching, steaming, and stir-frying. °· Limit your fluid intake if directed by your health care provider. Fluid restriction may reduce symptoms of heart failure. °Lifestyle °· Stop smoking or using chewing tobacco. Nicotine and tobacco can damage your heart and your blood vessels. Do not use nicotine gum or patches before talking to your health care provider. °· Limit alcohol intake to no more than 1 drink per day for non-pregnant women and 2 drinks per day for men. One drink equals 12 oz of beer, 5 oz of wine, or 1½ oz of hard liquor. °¨ Drinking more than that is harmful to your heart. Tell your health care provider if you drink alcohol several times a week. °¨ Talk with your health care provider about whether any level of alcohol use is safe for you. °¨ If your heart has already been damaged by alcohol or you have severe heart failure, drinking alcohol should be stopped completely. °· Stop use of illegal drugs. °· Lose weight if directed by your health care provider. Weight loss may reduce symptoms of heart failure. °· Do moderate physical activity if directed by your health care provider. People who are elderly and people with severe heart failure should consult with a health care provider for physical activity recommendations. °Monitor important information °·   Weigh yourself every day. Keeping track of your weight daily helps you to notice excess fluid sooner. °¨ Weigh yourself every morning after you urinate and before you eat breakfast. °¨ Wear the same amount of clothing each time you weigh yourself. °¨ Record your daily weight. Provide your health care provider with your weight record. °· Monitor and record your blood pressure as told by your  health care provider. °· Check your pulse as told by your health care provider. °Dealing with extreme temperatures °· If the weather is extremely hot: °¨ Avoid vigorous physical activity. °¨ Use air conditioning or fans or seek a cooler location. °¨ Avoid caffeine and alcohol. °¨ Wear loose-fitting, lightweight, and light-colored clothing. °· If the weather is extremely cold: °¨ Avoid vigorous physical activity. °¨ Layer your clothes. °¨ Wear mittens or gloves, a hat, and a scarf when you go outside. °¨ Avoid alcohol. °General instructions °· Manage other health conditions such as hypertension, diabetes, thyroid disease, or abnormal heart rhythms as told by your health care provider. °· Learn to manage stress. If you need help to do this, ask your health care provider. °· Plan rest periods when fatigued. °· Get ongoing education and support as needed. °· Participate in or seek rehabilitation as needed to maintain or improve independence and quality of life. °· Stay up to date with immunizations. Keeping current on pneumococcal and influenza immunizations is especially important to prevent respiratory infections. °· Keep all follow-up visits as told by your health care provider. This is important. °Contact a health care provider if: °· You have a rapid weight gain. °· You have increasing shortness of breath that is unusual for you. °· You are unable to participate in your usual physical activities. °· You tire easily. °· You cough more than normal, especially with physical activity. °· You have any swelling or more swelling in areas such as your hands, feet, ankles, or abdomen. °· You are unable to sleep because it is hard to breathe. °· You feel like your heart is beating quickly (palpitations). °· You become dizzy or light-headed when you stand up. °Get help right away if: °· You have difficulty breathing. °· You notice or your family notices a change in your awareness, such as having trouble staying awake or  having difficulty with concentration. °· You have pain or discomfort in your chest. °· You have an episode of fainting (syncope). °This information is not intended to replace advice given to you by your health care provider. Make sure you discuss any questions you have with your health care provider. °Document Released: 11/30/2005 Document Revised: 08/04/2016 Document Reviewed: 06/24/2016 °[employer] Interactive Patient Education © 2017 [employer] °Information on my medicine - Coumadin®   (Warfarin) ° °This medication education was reviewed with me or my healthcare representative as part of my discharge preparation.  The pharmacist that spoke with me during my hospital stay was:  Margaret E Shuda, RPH ° °Why was Coumadin prescribed for you? °Coumadin was prescribed for you because you have a blood clot or a medical condition that can cause an increased risk of forming blood clots. Blood clots can cause serious health problems by blocking the flow of blood to the heart, lung, or brain. Coumadin can prevent harmful blood clots from forming. °As a reminder your indication for Coumadin is:   Stroke Prevention Because Of Atrial Fibrillation ° °What test will check on my response to Coumadin? °While on Coumadin (warfarin) you will need to have an INR test   regularly to ensure that your dose is keeping you in the desired range. The INR (international normalized ratio) number is calculated from the result of the laboratory test called prothrombin time (PT). ° °If an INR APPOINTMENT HAS NOT ALREADY BEEN MADE FOR YOU please schedule an appointment to have this lab work done by your health care provider within 7 days. °Your INR goal is usually a number between:  2 to 3 or your provider may give you a more narrow range like 2-2.5.  Ask your health care provider during an office visit what your goal INR is. ° °What  do you need to  know  About  COUMADIN? °Take Coumadin (warfarin) exactly as prescribed by your healthcare provider  about the same time each day.  DO NOT stop taking without talking to the doctor who prescribed the medication.  Stopping without other blood clot prevention medication to take the place of Coumadin may increase your risk of developing a new clot or stroke.  Get refills before you run out. ° °What do you do if you miss a dose? °If you miss a dose, take it as soon as you remember on the same day then continue your regularly scheduled regimen the next day.  Do not take two doses of Coumadin at the same time. ° °Important Safety Information °A possible side effect of Coumadin (Warfarin) is an increased risk of bleeding. You should call your healthcare provider right away if you experience any of the following: °? Bleeding from an injury or your nose that does not stop. °? Unusual colored urine (red or dark brown) or unusual colored stools (red or black). °? Unusual bruising for unknown reasons. °? A serious fall or if you hit your head (even if there is no bleeding). ° °Some foods or medicines interact with Coumadin® (warfarin) and might alter your response to warfarin. To help avoid this: °? Eat a balanced diet, maintaining a consistent amount of Vitamin K. °? Notify your provider about major diet changes you plan to make. °? Avoid alcohol or limit your intake to 1 drink for women and 2 drinks for men per day. °(1 drink is 5 oz. wine, 12 oz. beer, or 1.5 oz. liquor.) ° °Make sure that ANY health care provider who prescribes medication for you knows that you are taking Coumadin (warfarin).  Also make sure the healthcare provider who is monitoring your Coumadin knows when you have started a new medication including herbals and non-prescription products. ° °Coumadin® (Warfarin)  Major Drug Interactions  °Increased Warfarin Effect Decreased Warfarin Effect  °Alcohol (large quantities) °Antibiotics (esp. Septra/Bactrim, Flagyl, Cipro) °Amiodarone (Cordarone) °Aspirin (ASA) °Cimetidine (Tagamet) °Megestrol (Megace) °NSAIDs  (ibuprofen, naproxen, etc.) °Piroxicam (Feldene) °Propafenone (Rythmol SR) °Propranolol (Inderal) °Isoniazid (INH) °Posaconazole (Noxafil) Barbiturates (Phenobarbital) °Carbamazepine (Tegretol) °Chlordiazepoxide (Librium) °Cholestyramine (Questran) °Griseofulvin °Oral Contraceptives °Rifampin °Sucralfate (Carafate) °Vitamin K  ° °Coumadin® (Warfarin) Major Herbal Interactions  °Increased Warfarin Effect Decreased Warfarin Effect  °Garlic °Ginseng °Ginkgo biloba Coenzyme Q10 °Green tea °St. John’s wort   ° °Coumadin® (Warfarin) FOOD Interactions  °Eat a consistent number of servings per week of foods HIGH in Vitamin K °(1 serving = ½ cup)  °Collards (cooked, or boiled & drained) °Kale (cooked, or boiled & drained) °Mustard greens (cooked, or boiled & drained) °Parsley *serving size only = ¼ cup °Spinach (cooked, or boiled & drained) °Swiss chard (cooked, or boiled & drained) °Turnip greens (cooked, or boiled & drained)  °Eat a consistent number of servings per week of foods   MEDIUM-HIGH in Vitamin K °(1 serving = 1 cup)  °Asparagus (cooked, or boiled & drained) °Broccoli (cooked, boiled & drained, or raw & chopped) °Brussel sprouts (cooked, or boiled & drained) *serving size only = ½ cup °Lettuce, raw (green leaf, endive, romaine) °Spinach, raw °Turnip greens, raw & chopped  ° °These websites have more information on Coumadin (warfarin):  www.coumadin.com; °www.ahrq.gov/consumer/coumadin.htm; ° ° ° °

## 2016-11-27 NOTE — Progress Notes (Signed)
Pt is currently weak dizzy/ head headed, blood 97/63 HR 80s after standing MD and Cardiologist aware, with parameters to give diuretics and blood pressure meds however,  patient is concerned about blood pressure and dizziness will recheck blood pressure with in 1 hour before given.

## 2016-11-27 NOTE — Discharge Summary (Signed)
Physician Discharge Summary  Daniel Cooke ZOX:[id number] DOB: [birth year] DOA: 11/25/2016  PCP: Hollice EspyGATES,DONNA RUTH, MD  Admit date: 11/25/2016 Discharge date: 11/27/2016  Admitted From: Home Discharge disposition: Home   Recommendations for Outpatient Follow-Up:   1. Note: Actos Has been discontinued due to its association with heart failure. Needs close follow-up of diabetes. 2. Follow-up in one week to evaluate control of heart failure symptoms on Lasix.   Discharge Diagnosis:   Principal Problem:    Acute On chronic diastolic CHF (congestive heart failure) (HCC) Active Problems:    Atrial fibrillation (HCC) [I48.91]    Stage III CKD (chronic kidney disease)    Anemia of chronic disease    HTN (hypertension)    HLD (hyperlipidemia)    Diabetes (HCC) with renal complications   Discharge Condition: Improved.  Diet recommendation: Low sodium, heart healthy.  Carbohydrate-modified.  R  History of Present Illness:   Daniel Cooke is an 82 y.o. male with a PMH of chronic atrial fibrillation with a CHADS-Vasc score of 4, on chronic Coumadin, hypertension, hyperlipidemia, diabetes (uncertain long time control) who was admitted 11/25/16 after being advised to come to the ED by his urologist due to progressive weight gain and scrotal swelling. Chest x-ray on admission showed mild CHF with small effusions.  Hospital Course by Problem:   Principal Problem:   Acute On chronic Diastolic CHF 2-D echo done, EF 60-65 percent with no regional wall motion abnormalities. Dry weight is low 190s. Has been diuresed to dry weight. Creatinine stable with diuresis. Evaluated by cardiologist. Will need to go home on oral Lasix.  Active Problems:   Atrial fibrillation (HCC) [I48.91] Rate controlled with heart rate in the 60s-70s. Not on rate controlling medications at baseline. Continue Coumadin. Cardiologist recommends plan for DCCV following 3 weeks of therapeutic INRs.   Stage III CKD (chronic kidney disease) Baseline creatinine is around 1.5. Current creatinine consistent with usual baseline values.    Anemia of chronic kidney disease Hemoglobin stable with no indication for transfusion.    HTN (hypertension) Blood pressure currently controlled. Continue Lasix and lisinopril.    HLD (hyperlipidemia) Continue simvastatin.    Diabetes (HCC) with complications Hemoglobin A1c 7%, reasonable control. Actos discontinued in light of association with heart failure. Will need close follow-up glycemic control.    Hypokalemia Placed on potassium supplementation.    Medical Consultants:    Cardiology: Corky CraftsJayadeep S Varanasi, MD   Discharge Exam:   Vitals:   11/26/16 2009 11/27/16 0529  BP:  126/61  Pulse:  76  Resp: 18 18  Temp: 98.4 F (36.9 C) 98.5 F (36.9 C)   Vitals:   11/26/16 0518 11/26/16 1126 11/26/16 2009 11/27/16 0529  BP: 133/64 136/64 128/77 126/61  Pulse: 71 66 75 76  Resp: 20 18 18 18   Temp: 97.9 F (36.6 C) 97.7 F (36.5 C) 98.4 F (36.9 C) 98.5 F (36.9 C)  TempSrc: Oral Oral Oral Oral  SpO2: 96% 96% 94% 96%  Weight: 92.9 kg (204 lb 12.8 oz)   86.9 kg (191 lb 8 oz)  Height:        General exam: Appears calm and comfortable.  Respiratory system: Clear to auscultation. Respiratory effort normal. Cardiovascular system: HSIR. No JVD,  rubs, gallops or clicks. No murmurs. Gastrointestinal system: Abdomen is nondistended, soft and nontender. No organomegaly or masses felt. Normal bowel sounds heard. Central nervous system: Alert and oriented. No focal neurological deficits. Extremities: No clubbing,  or cyanosis. 1+ edema. Skin:  No rashes, lesions or ulcers. Psychiatry: Judgement and insight appear normal. Mood & affect appropriate.    The results of significant diagnostics from this hospitalization (including imaging, microbiology, ancillary and laboratory) are listed below for reference.     Procedures and  Diagnostic Studies:   Dg Chest 2 View  Result Date: 11/25/2016 CLINICAL DATA:  Shortness of breath for 3-4 weeks intermittently, urinary frequency EXAM: CHEST  2 VIEW COMPARISON:  Chest x-ray of 02/22/2014 FINDINGS: There is cardiomegaly present with mild pulmonary vascular congestion and small effusions consistent with mild congestive heart failure. No pneumonia is seen. There are degenerative changes in the lower thoracic spine. IMPRESSION: Mild CHF with small effusions. Electronically Signed   By: Dwyane DeePaul  Barry M.D.   On: 11/25/2016 15:33     Labs:   Basic Metabolic Panel:  Recent Labs Lab 11/25/16 1437 11/26/16 0321 11/27/16 0327  NA 142 139 142  K 3.8 3.3* 3.6  CL 107 103 101  CO2 25 28 32  GLUCOSE 220* 145* 115*  BUN 49* 44* 42*  CREATININE 1.56* 1.50* 1.55*  CALCIUM 9.4 9.6 10.1   GFR Estimated Creatinine Clearance: 32.7 mL/min (by C-G formula based on SCr of 1.55 mg/dL (H)). Coagulation profile  Recent Labs Lab 11/25/16 1448 11/27/16 0327  INR 1.27 1.40    CBC:  Recent Labs Lab 11/25/16 1437  WBC 8.2  HGB 9.1*  HCT 27.8*  MCV 96.2  PLT 188   Cardiac Enzymes:  Recent Labs Lab 11/25/16 2121 11/26/16 0321 11/26/16 0901  TROPONINI 0.04* 0.04* 0.04*   CBG:  Recent Labs Lab 11/26/16 1129 11/26/16 1716 11/26/16 2104 11/27/16 0618 11/27/16 1133  GLUCAP 178* 139* 150* 101* 215*   Hgb A1c  Recent Labs  11/26/16 1445  HGBA1C 7.0*     Discharge Instructions:   Discharge Instructions    (HEART FAILURE PATIENTS) Call MD:  Anytime you have any of the following symptoms: 1) 3 pound weight gain in 24 hours or 5 pounds in 1 week 2) shortness of breath, with or without a dry hacking cough 3) swelling in the hands, feet or stomach 4) if you have to sleep on extra pillows at night in order to breathe.    Complete by:  As directed    Call MD for:  extreme fatigue    Complete by:  As directed    Call MD for:  persistant dizziness or light-headedness     Complete by:  As directed    Diet - low sodium heart healthy    Complete by:  As directed    Diet Carb Modified    Complete by:  As directed    Discharge instructions    Complete by:  As directed    You were treated for heart failure in the hospital.  To prevent exacerbations of your heart failure, it is important that you check your weight at the same time every day, and that if you gain over 3 pounds in 24 hours or 5 lbs in 1 week, OR you develop worsening swelling to the legs, experience more shortness of breath or chest pain, take an extra dose of Lasix and call your Primary MD or cardiologist.   Follow a heart healthy, low salt diet and restrict your fluid intake to 1.5 liters a day or less.  STOP taking ACTOS (diabetes medication) as this medication can be associated with exacerbations of heart failure.    Follow up in 1 week with your PCP so they can  check you and make sure your heart failure and diabetes remains under good control.   Increase activity slowly    Complete by:  As directed      Allergies as of 11/27/2016      Reactions   Codeine Nausea And Vomiting, Other (See Comments)   MAKES ME CRAZY   Oxycodone Nausea And Vomiting, Other (See Comments)   MAKES ME CRAZY      Medication List    STOP taking these medications   pioglitazone 15 MG tablet Commonly known as:  ACTOS     TAKE these medications   acetaminophen 325 MG tablet Commonly known as:  TYLENOL Take 325 mg by mouth every 6 (six) hours as needed for mild pain.   calcium carbonate 1500 (600 Ca) MG Tabs tablet Commonly known as:  OSCAL Take 1 tablet by mouth 2 (two) times daily with a meal.   furosemide 40 MG tablet Commonly known as:  LASIX Take 1 tablet (40 mg total) by mouth daily.   glipiZIDE 10 MG tablet Commonly known as:  GLUCOTROL Take 10 mg by mouth every evening.   lisinopril 20 MG tablet Commonly known as:  PRINIVIL,ZESTRIL Take 20 mg by mouth at bedtime.   metFORMIN 1000 MG  tablet Commonly known as:  GLUCOPHAGE Take 1,000 mg by mouth 2 (two) times daily with a meal.   multivitamin with minerals Tabs tablet Take 1 tablet by mouth daily.   potassium chloride SA 20 MEQ tablet Commonly known as:  K-DUR,KLOR-CON Take 1 tablet (20 mEq total) by mouth daily.   simvastatin 20 MG tablet Commonly known as:  ZOCOR Take 20 mg by mouth at bedtime.   terazosin 5 MG capsule Commonly known as:  HYTRIN Take 5 mg by mouth at bedtime.   VITAMIN B 12 PO Take by mouth daily.   warfarin 5 MG tablet Commonly known as:  COUMADIN Take as directed by coumadin clinic Notes to patient:  Every 3 or 4 weeks      Follow-up Information    Hollice Espy, MD. Go on 12/09/2016.   Specialty:  Family Medicine Why:  Hospital follow up of heart failure and diabetes.@3 :30PM Contact information: [address] [place] [address] [phone]            Time coordinating discharge: > 35 minutes.  Signed:  RAMA,CHRISTINA  Pager [phone] [facility] 11/27/2016, 12:21 PM

## 2016-12-02 ENCOUNTER — Ambulatory Visit (INDEPENDENT_AMBULATORY_CARE_PROVIDER_SITE_OTHER): Payer: Medicare Other | Admitting: *Deleted

## 2016-12-02 DIAGNOSIS — Z5181 Encounter for therapeutic drug level monitoring: Secondary | ICD-10-CM

## 2016-12-02 DIAGNOSIS — I4891 Unspecified atrial fibrillation: Secondary | ICD-10-CM | POA: Diagnosis not present

## 2016-12-02 DIAGNOSIS — I482 Chronic atrial fibrillation, unspecified: Secondary | ICD-10-CM

## 2016-12-02 LAB — POCT INR: INR: 1.2

## 2016-12-02 NOTE — Patient Instructions (Signed)
Reviewed discharge instructions with patient and wife of what medications to d/c and what medications to d/c, reinforced medication management

## 2016-12-09 DIAGNOSIS — Z7984 Long term (current) use of oral hypoglycemic drugs: Secondary | ICD-10-CM | POA: Diagnosis not present

## 2016-12-09 DIAGNOSIS — I5022 Chronic systolic (congestive) heart failure: Secondary | ICD-10-CM | POA: Diagnosis not present

## 2016-12-09 DIAGNOSIS — E119 Type 2 diabetes mellitus without complications: Secondary | ICD-10-CM | POA: Diagnosis not present

## 2016-12-10 ENCOUNTER — Ambulatory Visit (INDEPENDENT_AMBULATORY_CARE_PROVIDER_SITE_OTHER): Payer: Medicare Other | Admitting: *Deleted

## 2016-12-10 DIAGNOSIS — Z5181 Encounter for therapeutic drug level monitoring: Secondary | ICD-10-CM

## 2016-12-10 DIAGNOSIS — I4891 Unspecified atrial fibrillation: Secondary | ICD-10-CM

## 2016-12-10 DIAGNOSIS — I482 Chronic atrial fibrillation, unspecified: Secondary | ICD-10-CM

## 2016-12-10 LAB — POCT INR: INR: 1.5

## 2016-12-11 ENCOUNTER — Encounter: Payer: Self-pay | Admitting: Cardiology

## 2016-12-11 DIAGNOSIS — E875 Hyperkalemia: Secondary | ICD-10-CM | POA: Diagnosis not present

## 2016-12-17 DIAGNOSIS — Z5181 Encounter for therapeutic drug level monitoring: Secondary | ICD-10-CM | POA: Diagnosis not present

## 2016-12-17 DIAGNOSIS — E119 Type 2 diabetes mellitus without complications: Secondary | ICD-10-CM | POA: Diagnosis not present

## 2016-12-22 ENCOUNTER — Telehealth: Payer: Self-pay | Admitting: Pharmacist

## 2016-12-22 ENCOUNTER — Ambulatory Visit (INDEPENDENT_AMBULATORY_CARE_PROVIDER_SITE_OTHER): Payer: Medicare Other

## 2016-12-22 DIAGNOSIS — I4891 Unspecified atrial fibrillation: Secondary | ICD-10-CM | POA: Diagnosis not present

## 2016-12-22 DIAGNOSIS — R748 Abnormal levels of other serum enzymes: Secondary | ICD-10-CM | POA: Diagnosis not present

## 2016-12-22 DIAGNOSIS — I482 Chronic atrial fibrillation, unspecified: Secondary | ICD-10-CM

## 2016-12-22 DIAGNOSIS — Z5181 Encounter for therapeutic drug level monitoring: Secondary | ICD-10-CM

## 2016-12-22 LAB — POCT INR: INR: 3.2

## 2016-12-22 NOTE — Telephone Encounter (Signed)
Patient here for INR check today and reports that will have spinal injection sometime next week. Called [facility] to get procedure date and see since clearance was never faxed to our office. Spoke to [place] and she reports pt had injection in December without event. Instructed that a new clearance needs to be faxed each time the patient must hold Coumadin as patient medical status could change. Per Victorino DikeJennifer procedure scheduled for 12/28/16. Per protocol ok to hold for Afib with CHADS2 of 4 and no history of stroke. Hold coumadin 5 days prior to procedure, resume evening of unless otherwise directed.  Clearance faxed today.

## 2016-12-28 DIAGNOSIS — M5126 Other intervertebral disc displacement, lumbar region: Secondary | ICD-10-CM | POA: Diagnosis not present

## 2016-12-28 DIAGNOSIS — M5416 Radiculopathy, lumbar region: Secondary | ICD-10-CM | POA: Diagnosis not present

## 2017-01-01 DIAGNOSIS — E119 Type 2 diabetes mellitus without complications: Secondary | ICD-10-CM | POA: Diagnosis not present

## 2017-01-01 DIAGNOSIS — I5022 Chronic systolic (congestive) heart failure: Secondary | ICD-10-CM | POA: Diagnosis not present

## 2017-01-01 DIAGNOSIS — I10 Essential (primary) hypertension: Secondary | ICD-10-CM | POA: Diagnosis not present

## 2017-01-04 DIAGNOSIS — N183 Chronic kidney disease, stage 3 (moderate): Secondary | ICD-10-CM | POA: Diagnosis not present

## 2017-01-04 DIAGNOSIS — R42 Dizziness and giddiness: Secondary | ICD-10-CM | POA: Diagnosis not present

## 2017-01-07 ENCOUNTER — Ambulatory Visit (INDEPENDENT_AMBULATORY_CARE_PROVIDER_SITE_OTHER): Payer: Medicare Other | Admitting: *Deleted

## 2017-01-07 ENCOUNTER — Ambulatory Visit (INDEPENDENT_AMBULATORY_CARE_PROVIDER_SITE_OTHER): Payer: Medicare Other | Admitting: Cardiology

## 2017-01-07 ENCOUNTER — Telehealth: Payer: Self-pay | Admitting: Cardiology

## 2017-01-07 ENCOUNTER — Encounter: Payer: Self-pay | Admitting: Cardiology

## 2017-01-07 VITALS — BP 110/56 | HR 72 | Ht 68.0 in | Wt 176.0 lb

## 2017-01-07 DIAGNOSIS — Z5181 Encounter for therapeutic drug level monitoring: Secondary | ICD-10-CM | POA: Diagnosis not present

## 2017-01-07 DIAGNOSIS — I10 Essential (primary) hypertension: Secondary | ICD-10-CM | POA: Diagnosis not present

## 2017-01-07 DIAGNOSIS — I482 Chronic atrial fibrillation, unspecified: Secondary | ICD-10-CM

## 2017-01-07 DIAGNOSIS — I4891 Unspecified atrial fibrillation: Secondary | ICD-10-CM | POA: Diagnosis not present

## 2017-01-07 DIAGNOSIS — N183 Chronic kidney disease, stage 3 (moderate): Secondary | ICD-10-CM | POA: Diagnosis not present

## 2017-01-07 DIAGNOSIS — E119 Type 2 diabetes mellitus without complications: Secondary | ICD-10-CM | POA: Diagnosis not present

## 2017-01-07 DIAGNOSIS — I480 Paroxysmal atrial fibrillation: Secondary | ICD-10-CM | POA: Diagnosis not present

## 2017-01-07 DIAGNOSIS — Z7984 Long term (current) use of oral hypoglycemic drugs: Secondary | ICD-10-CM | POA: Diagnosis not present

## 2017-01-07 LAB — POCT INR: INR: 2.3

## 2017-01-07 NOTE — Progress Notes (Signed)
Electrophysiology Office Note   Date:  01/07/2017   ID:  Daniel Cooke, DOB 04-13-1924, MRN [id number]  PCP:  Hollice Espy, MD  Electrophysiologist:  Regan Lemming, MD    Chief Complaint  Patient presents with  . Follow-up    PAF     History of Present Illness: Daniel Cooke is a 83 y.o. male who presents today for electrophysiology evaluation.   He has a history of hypertension, diabetes, and hyperlipidemia. He was found to be in atrial fibrillation after a fall where he suffered a vertebral fracture.  He was found to have a pinched nerve around that vertebra and is planned to have injections performed. He was admitted for a HF exacerbation with worsening DOE and edema. His weight had increased by 20 lbs. BNP was elevated at 1068. It was thought that his AF was contributing to his HF episode. He was not cardioverted as he was planned to have a second steroid injection in his back in January.  Today, he denies symptoms of palpitations, chest pain, shortness of breath, orthopnea, PND, lower extremity edema, claudication, presyncope, syncope, or neurologic sequela. The patient is tolerating medications without difficulties and is otherwise without complaint today. He does say that he has had some fatigue and balance issues since being discharged from the hospital. These are now much different than prior to his hospital visit. He does continue to be in atrial fibrillation.  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  Past Medical History:  Diagnosis Date  . Arthritis    "was in left knee before replacement; now have it in my right knee" (11/25/2016)  . BPH (benign prostatic hypertrophy)   . Chronic diastolic CHF (congestive heart failure) (HCC)    a. 02/2016 Echo: EF 60-65%, no rwma, triv AI, mild MR, mildly to mod dil LA, mod dil RA, PASP .  . Essential hypertension   . Hard of hearing    wears bilateral hearing  aids  . Hypercholesterolemia   . Osteoarthritis   . PAF (paroxysmal atrial fibrillation) (HCC)    a. CHADS2VASC score of 4 --> coumadin.  . Pilonidal cyst    PAST HX - NO PROBLEM NOW  . Pneumonia    "one time; years ago" (11/25/2016)  . Shingles    "long time ago"  . Synovitis of knee 02/27/2014  . Type II diabetes mellitus (HCC)    oral meds - no insulin   Past Surgical History:  Procedure Laterality Date  . APPENDECTOMY    . CATARACT EXTRACTION W/ INTRAOCULAR LENS IMPLANT Left 10/2016  . CHOLECYSTECTOMY OPEN    . COLONOSCOPY W/ BIOPSIES  08/2005   [name] 04/27/2011  . EYE SURGERY Right    "right" traumatic cataract removed--injury to the eye--states his eyesight is ok in left eye  . JOINT REPLACEMENT    . KNEE ARTHROSCOPY Left 02/28/2014   Procedure: ARTHROSCOPY LEFT KNEE WITH SYNOVECTOMY;  Surgeon: [name], MD;  Location: [facility];  Service: Orthopedics;  Laterality: Left;  . SHOULDER OPEN ROTATOR CUFF REPAIR Left   . TONSILLECTOMY    . TOTAL KNEE ARTHROPLASTY  02/08/2012   Procedure: TOTAL KNEE ARTHROPLASTY;  Surgeon: [name], MD;  Location: [facility];  Service: Orthopedics;  Laterality: Left;  Marland Kitchen VASECTOMY       Current Outpatient Prescriptions  Medication Sig Dispense Refill  . acetaminophen (TYLENOL) 325 MG tablet Take 325  mg by mouth every 6 (six) hours as needed for mild pain.    . Cyanocobalamin (VITAMIN B 12 PO) Take by mouth daily.    Marland Kitchen glipiZIDE (GLUCOTROL) 10 MG tablet Take 10 mg by mouth every evening.     Marland Kitchen lisinopril (PRINIVIL,ZESTRIL) 20 MG tablet Take 20 mg by mouth at bedtime.    . metFORMIN (GLUCOPHAGE) 1000 MG tablet Take 1,000 mg by mouth 2 (two) times daily with a meal.    . potassium chloride SA (K-DUR,KLOR-CON) 20 MEQ tablet Take 1 tablet (20 mEq total) by mouth daily. 30 tablet 2  . simvastatin (ZOCOR) 20 MG tablet Take 20 mg by mouth at bedtime.    Marland Kitchen terazosin (HYTRIN) 5 MG capsule Take 5 mg by mouth at bedtime.    Marland Kitchen warfarin (COUMADIN) 5  MG tablet Take as directed by coumadin clinic 50 tablet 2   No current facility-administered medications for this visit.     Allergies:   Codeine and Oxycodone   Social History:  The patient  reports that he has never smoked. He has never used smokeless tobacco. He reports that he does not drink alcohol or use drugs.   Family History:  The patient's family history includes Heart attack in his brother; Heart disease in his brother and mother.    ROS:  Please see the history of present illness.   Otherwise, review of systems is positive for hearing loss, snoring, diarrhea, back pain, balance problems, dizziness, easy bruising.   All other systems are reviewed and negative.    PHYSICAL EXAM: VS:  BP (!) 110/56   Pulse 72   Ht 5\' 8"  (1.727 m)   Wt 176 lb (79.8 kg)   BMI 26.76 kg/m  , BMI Body mass index is 26.76 kg/m. GEN: Well nourished, well developed, in no acute distress  HEENT: normal  Neck: no JVD, carotid bruits, or masses Cardiac: iRRR; no murmurs, rubs, or gallops,no edema  Respiratory:  clear to auscultation bilaterally, normal work of breathing GI: soft, nontender, nondistended, + BS MS: no deformity or atrophy  Skin: warm and dry Neuro:  Strength and sensation are intact Psych: euthymic mood, full affect  EKG:  EKG is not ordered today. Personal review of the EKG ordered 11/26/16 shows atrial fibrillation, rate 67  Recent Labs: 02/03/2016: TSH 4.27 11/25/2016: B Natriuretic Peptide 1,068.2; Hemoglobin 9.1; Platelets 188 11/27/2016: BUN 42; Creatinine, Ser 1.55; Potassium 3.6; Sodium 142    Lipid Panel  No results found for: CHOL, TRIG, HDL, CHOLHDL, VLDL, LDLCALC, LDLDIRECT   Wt Readings from Last 3 Encounters:  01/07/17 176 lb (79.8 kg)  11/27/16 191 lb 8 oz (86.9 kg)  11/16/16 216 lb 3.2 oz (98.1 kg)      Other studies Reviewed: Additional studies/ records that were reviewed today include: 12/2010 MPI  Review of the above records today demonstrates:    ECG positive for ischemia, myoview with normal perfusion  11/26/16 TTE - Left ventricle: The cavity size was normal. Wall thickness was   increased in a pattern of moderate LVH. Systolic function was   normal. The estimated ejection fraction was in the range of 60%   to 65%. Wall motion was normal; there were no regional wall   motion abnormalities. - Mitral valve: There was moderate regurgitation. - Left atrium: The atrium was moderately dilated. - Right ventricle: The cavity size was mildly dilated. Wall   thickness was normal. - Right atrium: The atrium was severely dilated. - Tricuspid valve: There  was moderate regurgitation. - Pulmonary arteries: Systolic pressure was moderately increased.   PA peak pressure: 65 mm Hg (S).  ASSESSMENT AND PLAN:  1.  Atrial fibrillation: was noted to incidentally have atrial fibrillation. He is minimally some symptomatic with shortness of breath and fatigue. He has a CHADS2VASC score of 4 and therefore does require anticoagulation.  On warfarin for anticoagulation.  We'll plan for cardioversion once he has had 3 consecutive therapeutic INRs. Risks and benefits of the procedure were discussed. He has agreed to the procedure.   2. Diastolic heart failure: Currently well compensated today. We'll continue his Lasix. He Anysha Frappier likely need Lasix up until he gets cardioverted.  3. Hyperlipidemia: Continue statin   Current medicines are reviewed at length with the patient today.   The patient does not have concerns regarding his medicines.  The following changes were made today:  none  Labs/ tests ordered today include:  No orders of the defined types were placed in this encounter.    Disposition:   FU with Jeff Mccallum 3  months  Signed, Taci Sterling Jorja LoaMartin Myria Steenbergen, MD  01/07/2017 9:07 AM     [facility] [address] [place] [address] [phone] (office) [phone] (fax)

## 2017-01-07 NOTE — Telephone Encounter (Signed)
NEW MESSAGE      1. HAS QUESTION ABOUT HAVING HER DAD HEART SHOCKED.  2. PCP CHANGED 2 OF HIS MEDICATION , IS THERE A PROBLEM WITH THEY CHANGE THE  lisinopril (PRINIVIL,ZESTRIL) 20 MG tablet Take 20 mg by mouth at bedtime.    (THIS MEDICATION WAS STOPPED)  HE IS ONLY TAKING HALF THE DOSAGE OF LASIK?  3. CONFUSED ABOUT HAVING SHOTS DONE WITH THE WARAFRIN?     COULD YOU PLEASE CALL HER AND GO OVER NOTES FROM HIS VISIT TODAY

## 2017-01-07 NOTE — Patient Instructions (Signed)
Medication Instructions:    Your physician recommends that you continue on your current medications as directed. Please refer to the Current Medication list given to you today.  --- If you need a refill on your cardiac medications before your next appointment, please call your pharmacy. ---  Labwork:  None ordered  Testing/Procedures:  Your physician has recommended that you have a Cardioversion (DCCV). Electrical Cardioversion uses a jolt of electricity to your heart either through paddles or wired patches attached to your chest. This is a controlled, usually prescheduled, procedure. Defibrillation is done under light anesthesia in the hospital, and you usually go home the day of the procedure. This is done to get your heart back into a normal rhythm. You are not awake for the procedure.   -- Raeya Merritts, RN will call you to arrange this procedure once you have had 3-4 weeks  of therapeutic INRs.  Follow-Up:  To be determined once cardioversion is scheduled and completed.   Thank you for choosing [facility]!!   Dory HornSherri Tashema Tiller, RN [phone]

## 2017-01-07 NOTE — Telephone Encounter (Signed)
F/u message  Pt daughter call requesting to speak with RN about getting pts bp reading from appt on today. Please call back to discuss

## 2017-01-07 NOTE — Telephone Encounter (Signed)
dtr [name](Gayle) informs me patient is taking Lasix 20 mg daily and wanted it added to his list.  She also explained that his Lisinopril was stopped and wanted this updated also.  Informed that I would update pts chart to reflect med changes.

## 2017-01-13 DIAGNOSIS — N183 Chronic kidney disease, stage 3 (moderate): Secondary | ICD-10-CM | POA: Diagnosis not present

## 2017-01-14 ENCOUNTER — Ambulatory Visit (INDEPENDENT_AMBULATORY_CARE_PROVIDER_SITE_OTHER): Payer: Medicare Other | Admitting: Pharmacist

## 2017-01-14 DIAGNOSIS — I482 Chronic atrial fibrillation, unspecified: Secondary | ICD-10-CM

## 2017-01-14 DIAGNOSIS — I4891 Unspecified atrial fibrillation: Secondary | ICD-10-CM | POA: Diagnosis not present

## 2017-01-14 DIAGNOSIS — Z5181 Encounter for therapeutic drug level monitoring: Secondary | ICD-10-CM

## 2017-01-14 LAB — POCT INR: INR: 3

## 2017-01-21 ENCOUNTER — Ambulatory Visit (INDEPENDENT_AMBULATORY_CARE_PROVIDER_SITE_OTHER): Payer: Medicare Other | Admitting: Pharmacist

## 2017-01-21 ENCOUNTER — Encounter (INDEPENDENT_AMBULATORY_CARE_PROVIDER_SITE_OTHER): Payer: Self-pay

## 2017-01-21 DIAGNOSIS — I482 Chronic atrial fibrillation, unspecified: Secondary | ICD-10-CM

## 2017-01-21 DIAGNOSIS — Z5181 Encounter for therapeutic drug level monitoring: Secondary | ICD-10-CM

## 2017-01-21 DIAGNOSIS — I4891 Unspecified atrial fibrillation: Secondary | ICD-10-CM

## 2017-01-21 LAB — POCT INR: INR: 5

## 2017-01-28 DIAGNOSIS — M47816 Spondylosis without myelopathy or radiculopathy, lumbar region: Secondary | ICD-10-CM | POA: Diagnosis not present

## 2017-01-28 DIAGNOSIS — M5126 Other intervertebral disc displacement, lumbar region: Secondary | ICD-10-CM | POA: Diagnosis not present

## 2017-01-29 ENCOUNTER — Telehealth: Payer: Self-pay | Admitting: *Deleted

## 2017-01-29 ENCOUNTER — Ambulatory Visit (INDEPENDENT_AMBULATORY_CARE_PROVIDER_SITE_OTHER): Payer: Medicare Other | Admitting: *Deleted

## 2017-01-29 ENCOUNTER — Other Ambulatory Visit: Payer: Self-pay | Admitting: Cardiology

## 2017-01-29 ENCOUNTER — Telehealth: Payer: Self-pay | Admitting: Cardiology

## 2017-01-29 DIAGNOSIS — I4891 Unspecified atrial fibrillation: Secondary | ICD-10-CM | POA: Diagnosis not present

## 2017-01-29 DIAGNOSIS — I482 Chronic atrial fibrillation, unspecified: Secondary | ICD-10-CM

## 2017-01-29 DIAGNOSIS — Z5181 Encounter for therapeutic drug level monitoring: Secondary | ICD-10-CM | POA: Diagnosis not present

## 2017-01-29 LAB — POCT INR: INR: 3.6

## 2017-01-29 NOTE — Telephone Encounter (Signed)
Spoke with dtr -- arranged DCCV.  DCCV 2/22,  INR check prior to at [address] office.  Dtr [facility] for a 2:00 DCCV. Clear liquid breakfast prior to 7am No lasix morning of the procedure.  We will speak in the next week/two to arrange post DCCV f/u  Dtr is agreeable to plan and thanks me for helping.

## 2017-01-29 NOTE — Telephone Encounter (Signed)
New message    .pt states he spoke with someone in the coumadin clinic (tried to call - no asnwer), and he has already taken the coumadin he was supposed to take tonight and he has concerns.

## 2017-01-29 NOTE — Telephone Encounter (Signed)
Spoke with pt's daughter and she states that his pill box is empty so he has taken coumadin for today so instructed to have pt take 1/2 tablet tomorrow Feb 17th and she states understanding

## 2017-02-03 DIAGNOSIS — E119 Type 2 diabetes mellitus without complications: Secondary | ICD-10-CM | POA: Diagnosis not present

## 2017-02-03 DIAGNOSIS — E7800 Pure hypercholesterolemia, unspecified: Secondary | ICD-10-CM | POA: Diagnosis not present

## 2017-02-03 DIAGNOSIS — I10 Essential (primary) hypertension: Secondary | ICD-10-CM | POA: Diagnosis not present

## 2017-02-03 DIAGNOSIS — N183 Chronic kidney disease, stage 3 (moderate): Secondary | ICD-10-CM | POA: Diagnosis not present

## 2017-02-04 ENCOUNTER — Ambulatory Visit (HOSPITAL_COMMUNITY)
Admission: RE | Admit: 2017-02-04 | Discharge: 2017-02-04 | Disposition: A | Discharge status: Home / Self Care | Payer: Medicare Other | Source: Ambulatory Visit | Attending: Cardiology | Admitting: Cardiology

## 2017-02-04 ENCOUNTER — Encounter (HOSPITAL_COMMUNITY): Payer: Self-pay | Admitting: *Deleted

## 2017-02-04 ENCOUNTER — Ambulatory Visit (INDEPENDENT_AMBULATORY_CARE_PROVIDER_SITE_OTHER): Payer: Medicare Other | Admitting: *Deleted

## 2017-02-04 ENCOUNTER — Ambulatory Visit (HOSPITAL_COMMUNITY): Payer: Medicare Other | Admitting: Certified Registered"

## 2017-02-04 ENCOUNTER — Encounter (HOSPITAL_COMMUNITY)
Admission: RE | Disposition: A | Discharge status: Home / Self Care | Payer: Self-pay | Source: Ambulatory Visit | Attending: Cardiology

## 2017-02-04 DIAGNOSIS — Z7984 Long term (current) use of oral hypoglycemic drugs: Secondary | ICD-10-CM | POA: Insufficient documentation

## 2017-02-04 DIAGNOSIS — Z7901 Long term (current) use of anticoagulants: Secondary | ICD-10-CM | POA: Insufficient documentation

## 2017-02-04 DIAGNOSIS — E119 Type 2 diabetes mellitus without complications: Secondary | ICD-10-CM | POA: Diagnosis not present

## 2017-02-04 DIAGNOSIS — I481 Persistent atrial fibrillation: Secondary | ICD-10-CM | POA: Insufficient documentation

## 2017-02-04 DIAGNOSIS — I5032 Chronic diastolic (congestive) heart failure: Secondary | ICD-10-CM | POA: Insufficient documentation

## 2017-02-04 DIAGNOSIS — I110 Hypertensive heart disease with heart failure: Secondary | ICD-10-CM | POA: Diagnosis not present

## 2017-02-04 DIAGNOSIS — E785 Hyperlipidemia, unspecified: Secondary | ICD-10-CM | POA: Diagnosis not present

## 2017-02-04 DIAGNOSIS — I482 Chronic atrial fibrillation, unspecified: Secondary | ICD-10-CM

## 2017-02-04 DIAGNOSIS — Z96652 Presence of left artificial knee joint: Secondary | ICD-10-CM | POA: Insufficient documentation

## 2017-02-04 DIAGNOSIS — N183 Chronic kidney disease, stage 3 (moderate): Secondary | ICD-10-CM | POA: Diagnosis not present

## 2017-02-04 DIAGNOSIS — Z79899 Other long term (current) drug therapy: Secondary | ICD-10-CM | POA: Insufficient documentation

## 2017-02-04 DIAGNOSIS — Z5181 Encounter for therapeutic drug level monitoring: Secondary | ICD-10-CM

## 2017-02-04 DIAGNOSIS — E7800 Pure hypercholesterolemia, unspecified: Secondary | ICD-10-CM | POA: Insufficient documentation

## 2017-02-04 DIAGNOSIS — I480 Paroxysmal atrial fibrillation: Secondary | ICD-10-CM | POA: Diagnosis not present

## 2017-02-04 DIAGNOSIS — I4891 Unspecified atrial fibrillation: Secondary | ICD-10-CM | POA: Diagnosis not present

## 2017-02-04 DIAGNOSIS — I129 Hypertensive chronic kidney disease with stage 1 through stage 4 chronic kidney disease, or unspecified chronic kidney disease: Secondary | ICD-10-CM | POA: Diagnosis not present

## 2017-02-04 DIAGNOSIS — I509 Heart failure, unspecified: Secondary | ICD-10-CM | POA: Diagnosis not present

## 2017-02-04 HISTORY — PX: CARDIOVERSION: SHX1299

## 2017-02-04 LAB — POCT I-STAT, CHEM 8
BUN: 46 mg/dL — ABNORMAL HIGH (ref 6–20)
CALCIUM ION: 1.25 mmol/L (ref 1.15–1.40)
CHLORIDE: 102 mmol/L (ref 101–111)
Creatinine, Ser: 1.9 mg/dL — ABNORMAL HIGH (ref 0.61–1.24)
Glucose, Bld: 165 mg/dL — ABNORMAL HIGH (ref 65–99)
HCT: 32 % — ABNORMAL LOW (ref 39.0–52.0)
HEMOGLOBIN: 10.9 g/dL — AB (ref 13.0–17.0)
Potassium: 3.8 mmol/L (ref 3.5–5.1)
SODIUM: 139 mmol/L (ref 135–145)
TCO2: 27 mmol/L (ref 0–100)

## 2017-02-04 LAB — POCT INR: INR: 3

## 2017-02-04 SURGERY — CARDIOVERSION
Anesthesia: General

## 2017-02-04 MED ORDER — SODIUM CHLORIDE 0.9 % IV SOLN
INTRAVENOUS | Status: DC
  Administered 2017-02-04: 13:00:00 via INTRAVENOUS

## 2017-02-04 MED ORDER — SODIUM CHLORIDE 0.9 % IV SOLN
INTRAVENOUS | Status: DC | PRN
  Administered 2017-02-04: 13:00:00 via INTRAVENOUS

## 2017-02-04 MED ORDER — PROPOFOL 10 MG/ML IV BOLUS
INTRAVENOUS | Status: DC | PRN
  Administered 2017-02-04: 50 mg via INTRAVENOUS
  Administered 2017-02-04: 20 mg via INTRAVENOUS

## 2017-02-04 MED ORDER — LIDOCAINE HCL (CARDIAC) 20 MG/ML IV SOLN
INTRAVENOUS | Status: DC | PRN
  Administered 2017-02-04: 60 mg via INTRAVENOUS

## 2017-02-04 NOTE — Procedures (Signed)
Electrical Cardioversion Procedure Note Floy Sabinarnest P Broden [id number] 05-06-1924  Procedure: Electrical Cardioversion Indications:  Atrial Fibrillation  Procedure Details Consent: Risks of procedure as well as the alternatives and risks of each were explained to the (patient/caregiver).  Consent for procedure obtained. Time Out: Verified patient identification, verified procedure, site/side was marked, verified correct patient position, special equipment/implants available, medications/allergies/relevent history reviewed, required imaging and test results available.  Performed  Patient placed on cardiac monitor, pulse oximetry, supplemental oxygen as necessary.  Sedation given: Pt sedated by anesthesia with lidocaine 60 mg and diprovan 70 mg IV. Pacer pads placed anterior and posterior chest.  Cardioverted 4 time(s).  Cardioverted at 120J, 150J, 200J and 200J with pressure; atrial fibrillation persisted.  Evaluation Findings: Post procedure EKG shows:atrial fibrillation. Complications: None Patient did tolerate procedure well.  Continue present meds and fu with Dr Lyndal Rainbowamnitz  Brian Crenshaw 02/04/2017, 1:27 PM

## 2017-02-04 NOTE — Transfer of Care (Signed)
Immediate Anesthesia Transfer of Care Note  Patient: Daniel Cooke  Procedure(s) Performed: Procedure(s): CARDIOVERSION (N/A)  Patient Location: Endoscopy Unit  Anesthesia Type:General  Level of Consciousness: awake, alert , oriented and sedated  Airway & Oxygen Therapy: Patient Spontanous Breathing  Post-op Assessment: Report given to RN, Post -op Vital signs reviewed and stable and Patient moving all extremities X 4  Post vital signs: Reviewed and stable  Last Vitals:  Vitals:   02/04/17 1303  BP: (!) 149/62  Pulse: 74  Resp: 13  Temp: 36.5 C    Last Pain:  Vitals:   02/04/17 1303  TempSrc: Oral         Complications: No apparent anesthesia complications

## 2017-02-04 NOTE — H&P (Signed)
Daniel Cooke  01/07/2017 8:45 AM  Office Visit  MRN:  [id number]  Description: Male DOB: [birth year] Provider: Regan Lemming, MD Department: [facility] [facility]  Vitals   BP    110/56   Pulse  72   Ht  5\' 8"  (1.727 m)   Wt  176 lb (79.8 kg)   BMI  26.76 kg/m    Progress Notes   Daniel Jorja Loa, MD at 01/07/2017 9:15 AM   Status: Signed  Expand All Collapse All      Electrophysiology Office Note   Date:  01/07/2017   ID:  Daniel Cooke, DOB 08-01-1924, MRN [id number]  PCP:  Daniel Espy, MD     Electrophysiologist:  Daniel Lemming, MD              Chief Complaint  Patient presents with  . Follow-up    PAF     History of Present Illness: Daniel Cooke is a 83 y.o. male who presents today for electrophysiology evaluation.   He has a history of hypertension, diabetes, and hyperlipidemia. He was found to be in atrial fibrillation after a fall where he suffered a vertebral fracture.  He was found to have a pinched nerve around that vertebra and is planned to have injections performed. He was admitted for a HF exacerbation with worsening DOE and edema. His weight had increased by 20 lbs. BNP was elevated at 1068. It was thought that his AF was contributing to his HF episode. He was not cardioverted as he was planned to have a second steroid injection in his back in January.  Today, he denies symptoms of palpitations, chest pain, shortness of breath, orthopnea, PND, lower extremity edema, claudication, presyncope, syncope, or neurologic sequela. The patient is tolerating medications without difficulties and is otherwise without complaint today. He does say that he has had some fatigue and balance issues since being discharged from the hospital. These are now much different than prior to his hospital visit. He does continue to be in atrial fibrillation.  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      Past Medical History:  Diagnosis Date  . Arthritis    "was in left knee before replacement; now have it in my right knee" (11/25/2016)  . BPH (benign prostatic hypertrophy)   . Chronic diastolic CHF (congestive heart failure) (HCC)    a. 02/2016 Echo: EF 60-65%, no rwma, triv AI, mild MR, mildly to mod dil LA, mod dil RA, PASP .  . Essential hypertension   . Hard of hearing    wears bilateral hearing aids  . Hypercholesterolemia   . Osteoarthritis   . PAF (paroxysmal atrial fibrillation) (HCC)    a. CHADS2VASC score of 4 --> coumadin.  . Pilonidal cyst    PAST HX - NO PROBLEM NOW  . Pneumonia    "one time; years ago" (11/25/2016)  . Shingles    "long time ago"  . Synovitis of knee 02/27/2014  . Type II diabetes mellitus (HCC)    oral meds - no insulin        Past Surgical History:  Procedure Laterality Date  . APPENDECTOMY    . CATARACT EXTRACTION W/ INTRAOCULAR LENS IMPLANT Left 10/2016  . CHOLECYSTECTOMY OPEN    . COLONOSCOPY W/ BIOPSIES  08/2005   Daniel Cooke 04/27/2011  . EYE SURGERY Right    "right" traumatic cataract removed--injury to  the eye--states his eyesight is ok in left eye  . JOINT REPLACEMENT    . KNEE ARTHROSCOPY Left 02/28/2014   Procedure: ARTHROSCOPY LEFT KNEE WITH SYNOVECTOMY;  Surgeon: Daniel DrillingFrank V Aluisio, MD;  Location: [facility];  Service: Orthopedics;  Laterality: Left;  . SHOULDER OPEN ROTATOR CUFF REPAIR Left   . TONSILLECTOMY    . TOTAL KNEE ARTHROPLASTY  02/08/2012   Procedure: TOTAL KNEE ARTHROPLASTY;  Surgeon: Daniel DrillingFrank V Aluisio, MD;  Location: [facility];  Service: Orthopedics;  Laterality: Left;  Marland Kitchen. VASECTOMY             Current Outpatient Prescriptions  Medication Sig Dispense Refill  . acetaminophen (TYLENOL) 325 MG tablet Take 325 mg by mouth every 6 (six) hours as needed for mild pain.    . Cyanocobalamin (VITAMIN B 12 PO) Take by mouth daily.    Marland Kitchen. glipiZIDE (GLUCOTROL) 10 MG tablet Take 10  mg by mouth every evening.     Marland Kitchen. lisinopril (PRINIVIL,ZESTRIL) 20 MG tablet Take 20 mg by mouth at bedtime.    . metFORMIN (GLUCOPHAGE) 1000 MG tablet Take 1,000 mg by mouth 2 (two) times daily with a meal.    . potassium chloride SA (K-DUR,KLOR-CON) 20 MEQ tablet Take 1 tablet (20 mEq total) by mouth daily. 30 tablet 2  . simvastatin (ZOCOR) 20 MG tablet Take 20 mg by mouth at bedtime.    Marland Kitchen. terazosin (HYTRIN) 5 MG capsule Take 5 mg by mouth at bedtime.    Marland Kitchen. warfarin (COUMADIN) 5 MG tablet Take as directed by coumadin clinic 50 tablet 2   No current facility-administered medications for this visit.     Allergies:   Codeine and Oxycodone   Social History:  The patient  reports that he has never smoked. He has never used smokeless tobacco. He reports that he does not drink alcohol or use drugs.   Family History:  The patient's family history includes Heart attack in his brother; Heart disease in his brother and mother.    ROS:  Please see the history of present illness.   Otherwise, review of systems is positive for hearing loss, snoring, diarrhea, back pain, balance problems, dizziness, easy bruising.   All other systems are reviewed and negative.    PHYSICAL EXAM: VS:  BP (!) 110/56   Pulse 72   Ht 5\' 8"  (1.727 m)   Wt 176 lb (79.8 kg)   BMI 26.76 kg/m  , BMI Body mass index is 26.76 kg/m. GEN: Well nourished, well developed, in no acute distress  HEENT: normal  Neck: no JVD, carotid bruits, or masses Cardiac: iRRR; no murmurs, rubs, or gallops,no edema  Respiratory:  clear to auscultation bilaterally, normal work of breathing GI: soft, nontender, nondistended, + BS MS: no deformity or atrophy  Skin: warm and dry Neuro:  Strength and sensation are intact Psych: euthymic mood, full affect  EKG:  EKG is not ordered today. Personal review of the EKG ordered 11/26/16 shows atrial fibrillation, rate 67  Recent Labs: 02/03/2016: TSH 4.27 11/25/2016: B  Natriuretic Peptide 1,068.2; Hemoglobin 9.1; Platelets 188 11/27/2016: BUN 42; Creatinine, Ser 1.55; Potassium 3.6; Sodium 142    Lipid Panel  Labs (Brief)  No results found for: CHOL, TRIG, HDL, CHOLHDL, VLDL, LDLCALC, LDLDIRECT        Wt Readings from Last 3 Encounters:  01/07/17 176 lb (79.8 kg)  11/27/16 191 lb 8 oz (86.9 kg)  11/16/16 216 lb 3.2 oz (98.1 kg)      Other  studies Reviewed: Additional studies/ records that were reviewed today include: 12/2010 MPI  Review of the above records today demonstrates:  ECG positive for ischemia, myoview with normal perfusion  11/26/16 TTE - Left ventricle: The cavity size was normal. Wall thickness was increased in a pattern of moderate LVH. Systolic function was normal. The estimated ejection fraction was in the range of 60% to 65%. Wall motion was normal; there were no regional wall motion abnormalities. - Mitral valve: There was moderate regurgitation. - Left atrium: The atrium was moderately dilated. - Right ventricle: The cavity size was mildly dilated. Wall thickness was normal. - Right atrium: The atrium was severely dilated. - Tricuspid valve: There was moderate regurgitation. - Pulmonary arteries: Systolic pressure was moderately increased. PA peak pressure: 65 mm Hg (S).  ASSESSMENT AND PLAN:  1.  Atrial fibrillation: was noted to incidentally have atrial fibrillation. He is minimally some symptomatic with shortness of breath and fatigue. He has a CHADS2VASC score of 4 and therefore does require anticoagulation.  On warfarin for anticoagulation.  We'll plan for cardioversion once he has had 3 consecutive therapeutic INRs. Risks and benefits of the procedure were discussed. He has agreed to the procedure.   2. Diastolic heart failure: Currently well compensated today. We'll continue his Lasix. He Daniel likely need Lasix up until he gets cardioverted.  3. Hyperlipidemia: Continue  statin   Current medicines are reviewed at length with the patient today.   The patient does not have concerns regarding his medicines.  The following changes were made today:  none  Labs/ tests ordered today include:  No orders of the defined types were placed in this encounter.    Disposition:   FU with Daniel Camnitz 3  months  Signed, Daniel Jorja Loa, MD  01/07/2017 9:07 AM     [facility] [address] [place] [address] [phone] (office) [phone] (fax)     For DCCV; no changes Olga Millers

## 2017-02-04 NOTE — Anesthesia Preprocedure Evaluation (Addendum)
Anesthesia Evaluation  Patient identified by MRN, date of birth, ID band Patient awake    Reviewed: Allergy & Precautions, H&P , NPO status , Patient's Chart, lab work & pertinent test results  Airway Mallampati: III  TM Distance: >3 FB Neck ROM: Full    Dental no notable dental hx. (+) Teeth Intact, Dental Advisory Given   Pulmonary neg pulmonary ROS,    Pulmonary exam normal breath sounds clear to auscultation       Cardiovascular hypertension, +CHF  + dysrhythmias Atrial Fibrillation  Rhythm:Irregular Rate:Normal     Neuro/Psych negative neurological ROS  negative psych ROS   GI/Hepatic negative GI ROS, Neg liver ROS,   Endo/Other  diabetes, Type 2, Oral Hypoglycemic Agents  Renal/GU negative Renal ROS  negative genitourinary   Musculoskeletal  (+) Arthritis , Osteoarthritis,    Abdominal   Peds  Hematology negative hematology ROS (+) anemia ,   Anesthesia Other Findings   Reproductive/Obstetrics negative OB ROS                            Anesthesia Physical Anesthesia Plan  ASA: III  Anesthesia Plan: General   Post-op Pain Management:    Induction: Intravenous  Airway Management Planned: Mask  Additional Equipment:   Intra-op Plan:   Post-operative Plan:   Informed Consent: I have reviewed the patients History and Physical, chart, labs and discussed the procedure including the risks, benefits and alternatives for the proposed anesthesia with the patient or authorized representative who has indicated his/her understanding and acceptance.   Dental advisory given  Plan Discussed with: CRNA  Anesthesia Plan Comments:         Anesthesia Quick Evaluation

## 2017-02-04 NOTE — Discharge Instructions (Signed)
Electrical Cardioversion, Care After °This sheet gives you information about how to care for yourself after your procedure. Your health care provider may also give you more specific instructions. If you have problems or questions, contact your health care provider. °What can I expect after the procedure? °After the procedure, it is common to have: °· Some redness on the skin where the shocks were given. °Follow these instructions at home: °· Do not drive for 24 hours if you were given a medicine to help you relax (sedative). °· Take over-the-counter and prescription medicines only as told by your health care provider. °· Ask your health care provider how to check your pulse. Check it often. °· Rest for 48 hours after the procedure or as told by your health care provider. °· Avoid or limit your caffeine use as told by your health care provider. °Contact a health care provider if: °· You feel like your heart is beating too quickly or your pulse is not regular. °· You have a serious muscle cramp that does not go away. °Get help right away if: °· You have discomfort in your chest. °· You are dizzy or you feel faint. °· You have trouble breathing or you are short of breath. °· Your speech is slurred. °· You have trouble moving an arm or leg on one side of your body. °· Your fingers or toes turn cold or blue. °This information is not intended to replace advice given to you by your health care provider. Make sure you discuss any questions you have with your health care provider. °Document Released: 09/20/2013 Document Revised: 07/03/2016 Document Reviewed: 06/05/2016 °Elsevier Interactive Patient Education © 2017 [employer] ° °

## 2017-02-04 NOTE — Anesthesia Postprocedure Evaluation (Signed)
Anesthesia Post Note  Patient: Daniel Cooke  Procedure(s) Performed: Procedure(s) (LRB): CARDIOVERSION (N/A)  Patient location during evaluation: PACU Anesthesia Type: General Level of consciousness: awake and alert Pain management: pain level controlled Vital Signs Assessment: post-procedure vital signs reviewed and stable Respiratory status: spontaneous breathing, nonlabored ventilation and respiratory function stable Cardiovascular status: blood pressure returned to baseline and stable Postop Assessment: no signs of nausea or vomiting Anesthetic complications: no       Last Vitals:  Vitals:   02/04/17 1410 02/04/17 1420  BP: (!) 145/62 (!) 124/59  Pulse: (!) 50 65  Resp: 20 20  Temp:      Last Pain:  Vitals:   02/04/17 1350  TempSrc: Oral                 Champayne Kocian,W. EDMOND

## 2017-02-08 ENCOUNTER — Encounter (HOSPITAL_COMMUNITY): Payer: Self-pay | Admitting: Cardiology

## 2017-02-11 ENCOUNTER — Ambulatory Visit (INDEPENDENT_AMBULATORY_CARE_PROVIDER_SITE_OTHER): Payer: Medicare Other | Admitting: Pharmacist

## 2017-02-11 DIAGNOSIS — Z5181 Encounter for therapeutic drug level monitoring: Secondary | ICD-10-CM

## 2017-02-11 DIAGNOSIS — I4891 Unspecified atrial fibrillation: Secondary | ICD-10-CM | POA: Diagnosis not present

## 2017-02-11 DIAGNOSIS — N183 Chronic kidney disease, stage 3 (moderate): Secondary | ICD-10-CM | POA: Diagnosis not present

## 2017-02-11 DIAGNOSIS — R945 Abnormal results of liver function studies: Secondary | ICD-10-CM | POA: Diagnosis not present

## 2017-02-11 DIAGNOSIS — I482 Chronic atrial fibrillation, unspecified: Secondary | ICD-10-CM

## 2017-02-11 LAB — POCT INR: INR: 5.6

## 2017-02-16 ENCOUNTER — Ambulatory Visit (INDEPENDENT_AMBULATORY_CARE_PROVIDER_SITE_OTHER): Payer: Medicare Other | Admitting: Cardiology

## 2017-02-16 ENCOUNTER — Encounter: Payer: Self-pay | Admitting: Cardiology

## 2017-02-16 VITALS — BP 132/60 | HR 58 | Ht 70.0 in | Wt 187.0 lb

## 2017-02-16 DIAGNOSIS — I480 Paroxysmal atrial fibrillation: Secondary | ICD-10-CM

## 2017-02-16 DIAGNOSIS — Z96652 Presence of left artificial knee joint: Secondary | ICD-10-CM | POA: Diagnosis not present

## 2017-02-16 DIAGNOSIS — Z471 Aftercare following joint replacement surgery: Secondary | ICD-10-CM | POA: Diagnosis not present

## 2017-02-16 DIAGNOSIS — M1711 Unilateral primary osteoarthritis, right knee: Secondary | ICD-10-CM | POA: Diagnosis not present

## 2017-02-16 NOTE — Patient Instructions (Signed)
Medication Instructions:    Your physician recommends that you continue on your current medications as directed. Please refer to the Current Medication list given to you today.  - If you need a refill on your cardiac medications before your next appointment, please call your pharmacy.   Labwork:  None ordered  Testing/Procedures:  None ordered  Follow-Up:  Your physician recommends that you schedule a follow-up appointment in: 3 months with Dr. Camnitz.  Thank you for choosing [facility]!!   Redford Behrle, RN [phone]         

## 2017-02-16 NOTE — Progress Notes (Signed)
Electrophysiology Office Note   Date:  02/16/2017   ID:  DRAVON Cooke, DOB [birth year], MRN [id number]  PCP:  Daniel Cooke  Electrophysiologist:  Daniel Cooke    Chief Complaint  Patient presents with  . Follow-up    PAF/post DCCV     History of Present Illness: Daniel Cooke is a 83 y.o. male who presents today for electrophysiology evaluation.   He has a history of hypertension, diabetes, and hyperlipidemia. He was found to be in atrial fibrillation after a fall where he suffered a vertebral fracture.  He was found to have a pinched nerve around that vertebra and is planned to have injections performed. He was admitted for a HF exacerbation with worsening DOE and edema. His weight had increased by 20 lbs. BNP was elevated at 1068. It was thought that his AF was contributing to his HF episode. He was not cardioverted as he was planned to have a second steroid injection in his back in January.  Today, he denies symptoms of palpitations, chest pain, shortness of breath, orthopnea, PND, lower extremity edema, claudication, presyncope, syncope, or neurologic sequela. The patient is tolerating medications without difficulties. Had attempt at cardioversion 09/04/17 which was unsuccessful.  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  Past Medical History:  Diagnosis Date  . Arthritis    "was in left knee before replacement; now have it in my right knee" (11/25/2016)  . BPH (benign prostatic hypertrophy)   . Chronic diastolic CHF (congestive heart failure) (HCC)    a. 02/2016 Echo: EF 60-65%, no rwma, triv AI, mild MR, mildly to mod dil LA, mod dil RA, PASP .  . Essential hypertension   . Hard of hearing    wears bilateral hearing aids  . Hypercholesterolemia   . Osteoarthritis   . PAF (paroxysmal atrial fibrillation) (HCC)    a. CHADS2VASC score of 4 --> coumadin.  . Pilonidal cyst    PAST HX - NO PROBLEM  NOW  . Pneumonia    "one time; years ago" (11/25/2016)  . Shingles    "long time ago"  . Synovitis of knee 02/27/2014  . Type II diabetes mellitus (HCC)    oral meds - no insulin   Past Surgical History:  Procedure Laterality Date  . APPENDECTOMY    . CARDIOVERSION N/A 02/04/2017   Procedure: CARDIOVERSION;  Surgeon: Daniel Cooke;  Location: [facility] END[facility]COPY;  Service: Cardiovascular;  Laterality: N/A;  . CATARACT EXTRACTION W/ INTRAOCULAR LENS IMPLANT Left 10/2016  . CHOLECYSTECTOMY OPEN    . COLONOSCOPY W/ BIOPSIES  08/2005   [name] 04/27/2011  . EYE SURGERY Right    "right" traumatic cataract removed--injury to the eye--states his eyesight is ok in left eye  . JOINT REPLACEMENT    . KNEE ARTHROSCOPY Left 02/28/2014   Procedure: ARTHROSCOPY LEFT KNEE WITH SYNOVECTOMY;  Surgeon: Daniel Cooke;  Location: [facility];  Service: Orthopedics;  Laterality: Left;  . SHOULDER OPEN ROTATOR CUFF REPAIR Left   . TONSILLECTOMY    . TOTAL KNEE ARTHROPLASTY  02/08/2012   Procedure: TOTAL KNEE ARTHROPLASTY;  Surgeon: Daniel Cooke;  Location: [facility];  Service: Orthopedics;  Laterality: Left;  Daniel Cooke VASECTOMY       Current Outpatient Prescriptions  Medication Sig Dispense Refill  . acetaminophen (TYLENOL) 325 MG tablet Take 325 mg by mouth every 6 (six) hours as needed  for mild pain.    . furosemide (LASIX) 40 MG tablet Take 20 mg by mouth daily.    Daniel Cooke glipiZIDE (GLUCOTROL) 10 MG tablet Take 10 mg by mouth every evening.     . potassium chloride SA (K-DUR,KLOR-CON) 20 MEQ tablet Take 1 tablet (20 mEq total) by mouth daily. 30 tablet 2  . simvastatin (ZOCOR) 20 MG tablet Take 20 mg by mouth at bedtime.    Daniel Cooke terazosin (HYTRIN) 5 MG capsule Take 5 mg by mouth at bedtime.    Daniel Cooke warfarin (COUMADIN) 5 MG tablet Take as directed by coumadin clinic 50 tablet 2   No current facility-administered medications for this visit.     Allergies:   Codeine and Oxycodone   Social History:  The patient   reports that he has never smoked. He has never used smokeless tobacco. He reports that he does not drink alcohol or use drugs.   Family History:  The patient's family history includes Heart attack in his brother; Heart disease in his brother and mother.    ROS:  Please see the history of present illness.   Otherwise, review of systems is positive for none.   All other systems are reviewed and negative.    PHYSICAL EXAM: VS:  BP 132/60   Pulse (!) 58   Ht 5\' 10"  (1.778 m)   Wt 187 lb (84.8 kg)   BMI 26.83 kg/m  , BMI Body mass index is 26.83 kg/m. GEN: Well nourished, well developed, in no acute distress  HEENT: normal  Neck: no JVD, carotid bruits, or masses Cardiac: iRRR; no murmurs, rubs, or gallops,no edema  Respiratory:  clear to auscultation bilaterally, normal work of breathing GI: soft, nontender, nondistended, + BS MS: no deformity or atrophy  Skin: warm and dry Neuro:  Strength and sensation are intact Psych: euthymic mood, full affect  EKG:  EKG is ordered today. Personal review of the EKG ordered shows atrial fibrillation, rate 58  Recent Labs: 11/25/2016: B Natriuretic Peptide 1,068.2; Platelets 188 02/04/2017: BUN 46; Creatinine, Ser 1.90; Hemoglobin 10.9; Potassium 3.8; Sodium 139    Lipid Panel  No results found for: CHOL, TRIG, HDL, CHOLHDL, VLDL, LDLCALC, LDLDIRECT   Wt Readings from Last 3 Encounters:  02/16/17 187 lb (84.8 kg)  02/04/17 176 lb (79.8 kg)  01/07/17 176 lb (79.8 kg)      Other studies Reviewed: Additional studies/ records that were reviewed today include: 12/2010 MPI  Review of the above records today demonstrates:  ECG positive for ischemia, myoview with normal perfusion  11/26/16 TTE - Left ventricle: The cavity size was normal. Wall thickness was   increased in a pattern of moderate LVH. Systolic function was   normal. The estimated ejection fraction was in the range of 60%   to 65%. Wall motion was normal; there were no  regional wall   motion abnormalities. - Mitral valve: There was moderate regurgitation. - Left atrium: The atrium was moderately dilated. - Right ventricle: The cavity size was mildly dilated. Wall   thickness was normal. - Right atrium: The atrium was severely dilated. - Tricuspid valve: There was moderate regurgitation. - Pulmonary arteries: Systolic pressure was moderately increased.   PA peak pressure: 65 mm Hg (S).  ASSESSMENT AND PLAN:  1.  Atrial fibrillation: was noted to incidentally have atrial fibrillation. He is minimally some symptomatic with shortness of breath and fatigue. He has a CHADS2VASC score of 4 and therefore does require anticoagulation.  On warfarin for anticoagulation.  An attempt at cardioversion but did not convert him back to normal rhythm. He is minimally symptomatic at this time. He would prefer not to have further attempts at sinus rhythm. We'll continue current management.   2. Diastolic heart failure: Currently well compensated today. We'll continue his Lasix. He Oisin Yoakum likely need Lasix up until he gets cardioverted.  3. Hyperlipidemia: Continue statin   Current medicines are reviewed at length with the patient today.   The patient does not have concerns regarding his medicines.  The following changes were made today:  none  Labs/ tests ordered today include:  No orders of the defined types were placed in this encounter.    Disposition:   FU with Kawena Lyday 3  months  Signed, Annaleese Guier Jorja LoaMartin Kepler Mccabe, Cooke  02/16/2017 12:27 PM     [facility] [address] [place] [address] [phone] (office) [phone] (fax)

## 2017-02-18 ENCOUNTER — Ambulatory Visit (INDEPENDENT_AMBULATORY_CARE_PROVIDER_SITE_OTHER): Payer: Medicare Other | Admitting: *Deleted

## 2017-02-18 DIAGNOSIS — Z5181 Encounter for therapeutic drug level monitoring: Secondary | ICD-10-CM

## 2017-02-18 DIAGNOSIS — I482 Chronic atrial fibrillation, unspecified: Secondary | ICD-10-CM

## 2017-02-18 DIAGNOSIS — I4891 Unspecified atrial fibrillation: Secondary | ICD-10-CM | POA: Diagnosis not present

## 2017-02-18 LAB — POCT INR: INR: 1.7

## 2017-02-24 ENCOUNTER — Ambulatory Visit (INDEPENDENT_AMBULATORY_CARE_PROVIDER_SITE_OTHER): Payer: Medicare Other | Admitting: Pharmacist

## 2017-02-24 DIAGNOSIS — Z5181 Encounter for therapeutic drug level monitoring: Secondary | ICD-10-CM | POA: Diagnosis not present

## 2017-02-24 DIAGNOSIS — I482 Chronic atrial fibrillation, unspecified: Secondary | ICD-10-CM

## 2017-02-24 DIAGNOSIS — I4891 Unspecified atrial fibrillation: Secondary | ICD-10-CM | POA: Diagnosis not present

## 2017-02-24 LAB — POCT INR: INR: 2.6

## 2017-02-26 DIAGNOSIS — E119 Type 2 diabetes mellitus without complications: Secondary | ICD-10-CM | POA: Diagnosis not present

## 2017-02-27 ENCOUNTER — Other Ambulatory Visit: Payer: Self-pay | Admitting: Cardiology

## 2017-03-04 DIAGNOSIS — Z471 Aftercare following joint replacement surgery: Secondary | ICD-10-CM | POA: Diagnosis not present

## 2017-03-04 DIAGNOSIS — Z96652 Presence of left artificial knee joint: Secondary | ICD-10-CM | POA: Diagnosis not present

## 2017-03-04 DIAGNOSIS — M1711 Unilateral primary osteoarthritis, right knee: Secondary | ICD-10-CM | POA: Diagnosis not present

## 2017-03-05 ENCOUNTER — Ambulatory Visit (INDEPENDENT_AMBULATORY_CARE_PROVIDER_SITE_OTHER): Payer: Medicare Other | Admitting: *Deleted

## 2017-03-05 DIAGNOSIS — I4891 Unspecified atrial fibrillation: Secondary | ICD-10-CM

## 2017-03-05 DIAGNOSIS — I482 Chronic atrial fibrillation, unspecified: Secondary | ICD-10-CM

## 2017-03-05 DIAGNOSIS — Z5181 Encounter for therapeutic drug level monitoring: Secondary | ICD-10-CM | POA: Diagnosis not present

## 2017-03-05 LAB — POCT INR: INR: 4.1

## 2017-03-12 ENCOUNTER — Telehealth: Payer: Self-pay | Admitting: Cardiology

## 2017-03-12 NOTE — Telephone Encounter (Signed)
Daniel Cooke( daughter) is calling to find out when can he go off of his coumadin before his injection ? He gets injections for his back . Please call

## 2017-03-12 NOTE — Telephone Encounter (Signed)
New Message   Injection is scheduled for Monday the 9th  His back injection , he has to stop coumadin 5 days before the injection ,  Dr Ollen Bowl  [phone]   She needs to know because she is going out of town and she pre dispenses his medication.

## 2017-03-12 NOTE — Telephone Encounter (Signed)
Pt ok to hold Coumadin x5 days prior to spinal injection. CHADS2 score is 4 (HTN, DM, age, and HF), no hx of stroke. Pt's daughter coordinates medication and is aware.   Will contact Dr Ollen Bowl so that they can send over a clearance request form as well.

## 2017-03-12 NOTE — Telephone Encounter (Signed)
Apparently [facility] is closed for Good Friday. Will contact them next week for clearance request.

## 2017-03-12 NOTE — Telephone Encounter (Signed)
LMOM for pt's daughter to return call. We have not received any clearance for an upcoming procedure and will need details regarding procedure in order to clear him.

## 2017-03-16 NOTE — Telephone Encounter (Signed)
Received clearance request from [facility]. Ok to hold Coumadin for 5 days with no Lovenox bridge. Clearance faxed to [phone].

## 2017-03-22 DIAGNOSIS — M5126 Other intervertebral disc displacement, lumbar region: Secondary | ICD-10-CM | POA: Diagnosis not present

## 2017-03-22 DIAGNOSIS — M5416 Radiculopathy, lumbar region: Secondary | ICD-10-CM | POA: Diagnosis not present

## 2017-03-30 ENCOUNTER — Ambulatory Visit (INDEPENDENT_AMBULATORY_CARE_PROVIDER_SITE_OTHER): Payer: Medicare Other | Admitting: *Deleted

## 2017-03-30 DIAGNOSIS — I482 Chronic atrial fibrillation, unspecified: Secondary | ICD-10-CM

## 2017-03-30 DIAGNOSIS — Z5181 Encounter for therapeutic drug level monitoring: Secondary | ICD-10-CM

## 2017-03-30 LAB — POCT INR: INR: 1.1

## 2017-04-01 DIAGNOSIS — E1165 Type 2 diabetes mellitus with hyperglycemia: Secondary | ICD-10-CM | POA: Diagnosis not present

## 2017-04-01 DIAGNOSIS — Z794 Long term (current) use of insulin: Secondary | ICD-10-CM | POA: Diagnosis not present

## 2017-04-02 DIAGNOSIS — Z794 Long term (current) use of insulin: Secondary | ICD-10-CM | POA: Diagnosis not present

## 2017-04-02 DIAGNOSIS — E119 Type 2 diabetes mellitus without complications: Secondary | ICD-10-CM | POA: Diagnosis not present

## 2017-04-07 ENCOUNTER — Ambulatory Visit (INDEPENDENT_AMBULATORY_CARE_PROVIDER_SITE_OTHER): Payer: Medicare Other | Admitting: *Deleted

## 2017-04-07 DIAGNOSIS — Z5181 Encounter for therapeutic drug level monitoring: Secondary | ICD-10-CM

## 2017-04-07 DIAGNOSIS — I482 Chronic atrial fibrillation, unspecified: Secondary | ICD-10-CM

## 2017-04-07 LAB — POCT INR: INR: 2.1

## 2017-04-14 ENCOUNTER — Ambulatory Visit (INDEPENDENT_AMBULATORY_CARE_PROVIDER_SITE_OTHER): Payer: Medicare Other | Admitting: Pharmacist

## 2017-04-14 DIAGNOSIS — Z5181 Encounter for therapeutic drug level monitoring: Secondary | ICD-10-CM

## 2017-04-14 DIAGNOSIS — I482 Chronic atrial fibrillation, unspecified: Secondary | ICD-10-CM

## 2017-04-14 LAB — PROTIME-INR
INR: 6.2 — AB (ref 0.8–1.2)
Prothrombin Time: 61.5 s — ABNORMAL HIGH (ref 9.1–12.0)

## 2017-04-14 LAB — POCT INR: INR: 6.4

## 2017-04-19 DIAGNOSIS — I10 Essential (primary) hypertension: Secondary | ICD-10-CM | POA: Diagnosis not present

## 2017-04-19 DIAGNOSIS — M47816 Spondylosis without myelopathy or radiculopathy, lumbar region: Secondary | ICD-10-CM | POA: Diagnosis not present

## 2017-04-19 DIAGNOSIS — M5126 Other intervertebral disc displacement, lumbar region: Secondary | ICD-10-CM | POA: Diagnosis not present

## 2017-04-19 DIAGNOSIS — M5416 Radiculopathy, lumbar region: Secondary | ICD-10-CM | POA: Diagnosis not present

## 2017-04-21 DIAGNOSIS — I10 Essential (primary) hypertension: Secondary | ICD-10-CM | POA: Diagnosis not present

## 2017-04-21 DIAGNOSIS — E1165 Type 2 diabetes mellitus with hyperglycemia: Secondary | ICD-10-CM | POA: Diagnosis not present

## 2017-04-21 DIAGNOSIS — Z794 Long term (current) use of insulin: Secondary | ICD-10-CM | POA: Diagnosis not present

## 2017-04-21 DIAGNOSIS — M5442 Lumbago with sciatica, left side: Secondary | ICD-10-CM | POA: Diagnosis not present

## 2017-04-23 ENCOUNTER — Ambulatory Visit (INDEPENDENT_AMBULATORY_CARE_PROVIDER_SITE_OTHER): Payer: Medicare Other

## 2017-04-23 DIAGNOSIS — I482 Chronic atrial fibrillation, unspecified: Secondary | ICD-10-CM

## 2017-04-23 DIAGNOSIS — Z5181 Encounter for therapeutic drug level monitoring: Secondary | ICD-10-CM

## 2017-04-23 LAB — POCT INR: INR: 4.4

## 2017-04-26 DIAGNOSIS — M47816 Spondylosis without myelopathy or radiculopathy, lumbar region: Secondary | ICD-10-CM | POA: Diagnosis not present

## 2017-04-26 DIAGNOSIS — M5489 Other dorsalgia: Secondary | ICD-10-CM | POA: Diagnosis not present

## 2017-04-26 DIAGNOSIS — M5416 Radiculopathy, lumbar region: Secondary | ICD-10-CM | POA: Diagnosis not present

## 2017-04-26 DIAGNOSIS — M5126 Other intervertebral disc displacement, lumbar region: Secondary | ICD-10-CM | POA: Diagnosis not present

## 2017-04-30 ENCOUNTER — Ambulatory Visit (INDEPENDENT_AMBULATORY_CARE_PROVIDER_SITE_OTHER): Payer: Medicare Other | Admitting: *Deleted

## 2017-04-30 DIAGNOSIS — Z5181 Encounter for therapeutic drug level monitoring: Secondary | ICD-10-CM

## 2017-04-30 DIAGNOSIS — I482 Chronic atrial fibrillation, unspecified: Secondary | ICD-10-CM

## 2017-04-30 LAB — POCT INR: INR: 3.4

## 2017-05-02 DIAGNOSIS — E119 Type 2 diabetes mellitus without complications: Secondary | ICD-10-CM | POA: Diagnosis not present

## 2017-05-05 DIAGNOSIS — E1165 Type 2 diabetes mellitus with hyperglycemia: Secondary | ICD-10-CM | POA: Diagnosis not present

## 2017-05-05 DIAGNOSIS — Z794 Long term (current) use of insulin: Secondary | ICD-10-CM | POA: Diagnosis not present

## 2017-05-05 DIAGNOSIS — S32040A Wedge compression fracture of fourth lumbar vertebra, initial encounter for closed fracture: Secondary | ICD-10-CM | POA: Diagnosis not present

## 2017-05-05 DIAGNOSIS — M5416 Radiculopathy, lumbar region: Secondary | ICD-10-CM | POA: Diagnosis not present

## 2017-05-11 ENCOUNTER — Encounter: Payer: Self-pay | Admitting: Dietician

## 2017-05-11 ENCOUNTER — Encounter (INDEPENDENT_AMBULATORY_CARE_PROVIDER_SITE_OTHER): Payer: Self-pay

## 2017-05-11 ENCOUNTER — Ambulatory Visit (INDEPENDENT_AMBULATORY_CARE_PROVIDER_SITE_OTHER): Payer: Medicare Other | Admitting: Pharmacist

## 2017-05-11 ENCOUNTER — Encounter: Payer: Medicare Other | Attending: Family Medicine | Admitting: Dietician

## 2017-05-11 DIAGNOSIS — I482 Chronic atrial fibrillation, unspecified: Secondary | ICD-10-CM

## 2017-05-11 DIAGNOSIS — Z713 Dietary counseling and surveillance: Secondary | ICD-10-CM | POA: Diagnosis not present

## 2017-05-11 DIAGNOSIS — Z6825 Body mass index (BMI) 25.0-25.9, adult: Secondary | ICD-10-CM | POA: Diagnosis not present

## 2017-05-11 DIAGNOSIS — E1165 Type 2 diabetes mellitus with hyperglycemia: Secondary | ICD-10-CM | POA: Insufficient documentation

## 2017-05-11 DIAGNOSIS — E1122 Type 2 diabetes mellitus with diabetic chronic kidney disease: Secondary | ICD-10-CM

## 2017-05-11 DIAGNOSIS — N183 Chronic kidney disease, stage 3 unspecified: Secondary | ICD-10-CM

## 2017-05-11 DIAGNOSIS — Z5181 Encounter for therapeutic drug level monitoring: Secondary | ICD-10-CM

## 2017-05-11 LAB — POCT INR: INR: 2.7

## 2017-05-11 NOTE — Patient Instructions (Signed)
Be sure to have breakfast, lunch and dinner daily. 1/2 the plate should be vegetables, a quarter carbohydrate, a quarter protein. Foods should be prepared without salt.  Avoid processed meat or choose those lowest in fat and sodium.] Use Glucerna for a snack or meal if unable to eat well. Each meal and snack should contain small amounts of protein. Continue to stay as active as you can.  Continue to check your blood sugar as directed by your doctor. Continue your medication as prescribed.

## 2017-05-11 NOTE — Progress Notes (Signed)
Diabetes Self-Management Education  Visit Type: First/Initial  Appt. Start Time: 1400 Appt. End Time: 1500  05/11/2017  [name], identified by name and date of birth, is a 83 y.o. male with a diagnosis of Diabetes: Type 2.  Other hx includes HTN, hyperlipidemia, CHF, a-fib on coumadin, and CKD with eGFR of 39 4/18.   Medications include 20 units of Lantus each HS.  He was not eating well after his wife died and blood sugar dropped to 45.  The fast acting insulin was held at that time.  Metformin was stopped due to kidney function.    Patient lost his wife 2 weeks ago.  Since, a friend, Daniel Cooke, has been living with him.  He has been heating food up that his daughter brings or going out to eat.  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  His daughter is here today to know more what he needs to eat as she does the grocery shopping most often.  He still drives.   ASSESSMENT  Height 5' 10"  (1.778 m), weight 178 lb (80.7 kg). Body mass index is 25.54 kg/m.  His weight is generally stable 175-180 lbs.        Diabetes Self-Management Education - 05/11/17 1407      Visit Information   Visit Type First/Initial     Initial Visit   Diabetes Type Type 1   Are you currently following a meal plan? No   Are you taking your medications as prescribed? Yes   Date Diagnosed >20 years ago     Health Coping   How would you rate your overall health? Good     Psychosocial Assessment   Patient Belief/Attitude about Diabetes Motivated to manage diabetes   Self-care barriers Other (comment)  wife jsut died, lives with room mate, daughters cook   Self-management support Doctor's office;Family   Other persons present Patient;Family Member   Patient Concerns Nutrition/Meal planning;Glycemic Control   Special Needs None;Instruct caregiver   Preferred Learning Style No preference indicated   Learning Readiness Ready   How often do you need to have someone help you when you read  instructions, pamphlets, or other written materials from your doctor or pharmacy? 1 - Never   What is the last grade level you completed in school? 12th grade     Pre-Education Assessment   Patient understands the diabetes disease and treatment process. Needs Review   Patient understands incorporating nutritional management into lifestyle. Needs Review   Patient undertands incorporating physical activity into lifestyle. Needs Review   Patient understands using medications safely. Needs Review   Patient understands monitoring blood glucose, interpreting and using results Needs Review   Patient understands prevention, detection, and treatment of acute complications. Needs Review   Patient understands prevention, detection, and treatment of chronic complications. Needs Review   Patient understands how to develop strategies to address psychosocial issues. Needs Review   Patient understands how to develop strategies to promote health/change behavior. Needs Review     Complications   Last HgB A1C per patient/outside source 9 %  03/2017   How often do you check your blood sugar? 3-4 times/day   Fasting Blood glucose range (mg/dL) 70-129;>200;180-200;130-179   Postprandial Blood glucose range (mg/dL) >200   Number of hypoglycemic episodes per month 1   Can you tell when your blood sugar is low? Yes   What do you do if your blood sugar is low? drink OJ   Number of hyperglycemic episodes  per week 14   Have you had a dilated eye exam in the past 12 months? Yes   Have you had a dental exam in the past 12 months? No   Are you checking your feet? No     Dietary Intake   Breakfast 2 eggs, sausage OR cornflakes with skim milk  8   Snack (morning) unsalted crackers with peanut butter or cheese stick OR Almonds OR Glucerna   Lunch often out to eat:  chicken sandwich or hamburger without bun and salad   Snack (afternoon) unsalted crackers with peanut butter or cheese stick OR Almonds OR Glucerna  12-1    Dinner same as lunch  5:30-6   Snack (evening) unsalted crackers with peanut butter or cheese stick OR Almonds OR Glucerna   Beverage(s) diet pepsi (16 ounces), water, skim milk     Exercise   Exercise Type ADL's   How many days per week to you exercise? 0   How many minutes per day do you exercise? 0   Total minutes per week of exercise 0     Patient Education   Previous Diabetes Education No   Disease state  Other (comment)  Diabetes review and things that effect blood sugar   Nutrition management  Role of diet in the treatment of diabetes and the relationship between the three main macronutrients and blood glucose level;Food label reading, portion sizes and measuring food.;Information on hints to eating out and maintain blood glucose control.;Meal options for control of blood glucose level and chronic complications.;Other (comment)  Discusssed importance of low sodium.  Avoid No Salt adn Lite Salt products or other with added potassuim.   Physical activity and exercise  Role of exercise on diabetes management, blood pressure control and cardiac health.   Medications Reviewed patients medication for diabetes, action, purpose, timing of dose and side effects.   Monitoring Identified appropriate SMBG and/or A1C goals.   Acute complications Taught treatment of hypoglycemia - the 15 rule.   Psychosocial adjustment Role of stress on diabetes;Worked with patient to identify barriers to care and solutions;Identified and addressed patients feelings and concerns about diabetes   Personal strategies to promote health Lifestyle issues that need to be addressed for better diabetes care     Individualized Goals (developed by patient)   Nutrition General guidelines for healthy choices and portions discussed   Physical Activity Exercise 5-7 days per week;15 minutes per day   Medications take my medication as prescribed   Monitoring  test my blood glucose as discussed   Problem Solving low sodium  when eating out   Reducing Risk examine blood glucose patterns;treat hypoglycemia with 15 grams of carbs if blood glucose less than 2m/dL   Health Coping discuss diabetes with (comment)  MD/RD/daughters     Post-Education Assessment   Patient understands the diabetes disease and treatment process. Demonstrates understanding / competency   Patient understands incorporating nutritional management into lifestyle. Demonstrates understanding / competency   Patient undertands incorporating physical activity into lifestyle. Demonstrates understanding / competency   Patient understands using medications safely. Demonstrates understanding / competency   Patient understands monitoring blood glucose, interpreting and using results Demonstrates understanding / competency   Patient understands prevention, detection, and treatment of acute complications. Demonstrates understanding / competency   Patient understands prevention, detection, and treatment of chronic complications. Demonstrates understanding / competency   Patient understands how to develop strategies to address psychosocial issues. Demonstrates understanding / competency   Patient understands how to develop  strategies to promote health/change behavior. Demonstrates understanding / competency     Outcomes   Expected Outcomes Demonstrated interest in learning. Expect positive outcomes   Future DMSE PRN   Program Status Completed      Individualized Plan for Diabetes Self-Management Training:   Learning Objective:  Patient will have a greater understanding of diabetes self-management. Patient education plan is to attend individual and/or group sessions per assessed needs and concerns.   Plan:   There are no Patient Instructions on file for this visit.  Expected Outcomes:  Demonstrated interest in learning. Expect positive outcomes  Education material provided: Living Well with Diabetes, Food label handouts, A1C conversion sheet, Meal  plan card, My Plate and Snack sheet  If problems or questions, patient to contact team via:  Phone  Future DSME appointment: PRN

## 2017-05-14 ENCOUNTER — Other Ambulatory Visit: Payer: Self-pay | Admitting: Neurosurgery

## 2017-05-14 DIAGNOSIS — Z6828 Body mass index (BMI) 28.0-28.9, adult: Secondary | ICD-10-CM | POA: Diagnosis not present

## 2017-05-14 DIAGNOSIS — I10 Essential (primary) hypertension: Secondary | ICD-10-CM | POA: Diagnosis not present

## 2017-05-14 DIAGNOSIS — S32040G Wedge compression fracture of fourth lumbar vertebra, subsequent encounter for fracture with delayed healing: Secondary | ICD-10-CM | POA: Diagnosis not present

## 2017-05-17 ENCOUNTER — Telehealth: Payer: Self-pay | Admitting: Pharmacist

## 2017-05-17 DIAGNOSIS — E119 Type 2 diabetes mellitus without complications: Secondary | ICD-10-CM | POA: Diagnosis not present

## 2017-05-17 NOTE — Telephone Encounter (Signed)
Pt's daughter returned call, INR check moved to 6/15.

## 2017-05-17 NOTE — Telephone Encounter (Signed)
Received clearance from [facility] that pt is scheduled for an L4 kyphoplasty with Dr Venetia MaxonStern on 05/28/17. Pt takes Coumadin for afib with CHADS2 score of 4 (HTN, DM, age, HF), no hx of stroke. Has previously held for 5 days without Lovenox. Ok to hold Coumadin for 5 days prior to procedure.  Will check INR day of procedure, LMOM for pt to reschedule his appt and move to 6/15 after holding Coumadin for 5 days.

## 2017-05-19 ENCOUNTER — Telehealth: Payer: Self-pay | Admitting: *Deleted

## 2017-05-19 NOTE — Telephone Encounter (Signed)
Faxed cardiac clearance to [facility]. Clearance for L4 Kyphoplasty scheduled for 05/28/17.  Clearance states: Intermediate risk for intermediate risk procedure.  Coumadin may be stopped 5 days prior to procedure.  Clearance given to medical records to fax.

## 2017-05-20 NOTE — Progress Notes (Deleted)
Electrophysiology Office Note   Date:  05/20/2017   ID:  Daniel Cooke, DOB 1924/10/14, MRN [id number]  PCP:  Shaune Pollack, MD  Electrophysiologist:  Regan Lemming, MD    No chief complaint on file.    History of Present Illness: Daniel Cooke is a 83 y.o. male who presents today for electrophysiology evaluation.   He has a history of hypertension, diabetes, and hyperlipidemia. He was found to be in atrial fibrillation after a fall where he suffered a vertebral fracture.  He was found to have a pinched nerve around that vertebra and is planned to have injections performed. He was admitted for a HF exacerbation with worsening DOE and edema. His weight had increased by 20 lbs. BNP was elevated at 1068. It was thought that his AF was contributing to his HF episode. He was not cardioverted as he was planned to have a second steroid injection in his back in January.  Today, denies symptoms of palpitations, chest pain, shortness of breath, orthopnea, PND, lower extremity edema, claudication, dizziness, presyncope, syncope, bleeding, or neurologic sequela. The patient is tolerating medications without difficulties and is otherwise without complaint today.   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  Past Medical History:  Diagnosis Date  . Arthritis    "was in left knee before replacement; now have it in my right knee" (11/25/2016)  . BPH (benign prostatic hypertrophy)   . Chronic diastolic CHF (congestive heart failure) (HCC)    a. 02/2016 Echo: EF 60-65%, no rwma, triv AI, mild MR, mildly to mod dil LA, mod dil RA, PASP .  . Essential hypertension   . Hard of hearing    wears bilateral hearing aids  . Hypercholesterolemia   . Osteoarthritis   . PAF (paroxysmal atrial fibrillation) (HCC)    a. CHADS2VASC score of 4 --> coumadin.  . Pilonidal cyst    PAST HX - NO PROBLEM NOW  . Pneumonia    "one time; years ago"  (11/25/2016)  . Shingles    "long time ago"  . Synovitis of knee 02/27/2014  . Type II diabetes mellitus (HCC)    oral meds - no insulin   Past Surgical History:  Procedure Laterality Date  . APPENDECTOMY    . CARDIOVERSION N/A 02/04/2017   Procedure: CARDIOVERSION;  Surgeon: [name], MD;  Location: [facility] ENDOSCOPY;  Service: Cardiovascular;  Laterality: N/A;  . CATARACT EXTRACTION W/ INTRAOCULAR LENS IMPLANT Left 10/2016  . CHOLECYSTECTOMY OPEN    . COLONOSCOPY W/ BIOPSIES  08/2005   [name] 04/27/2011  . EYE SURGERY Right    "right" traumatic cataract removed--injury to the eye--states his eyesight is ok in left eye  . JOINT REPLACEMENT    . KNEE ARTHROSCOPY Left 02/28/2014   Procedure: ARTHROSCOPY LEFT KNEE WITH SYNOVECTOMY;  Surgeon: [name], MD;  Location: [facility] ORS;  Service: Orthopedics;  Laterality: Left;  . SHOULDER OPEN ROTATOR CUFF REPAIR Left   . TONSILLECTOMY    . TOTAL KNEE ARTHROPLASTY  02/08/2012   Procedure: TOTAL KNEE ARTHROPLASTY;  Surgeon: [name], MD;  Location: [facility] ORS;  Service: Orthopedics;  Laterality: Left;  Marland Kitchen VASECTOMY       Current Outpatient Prescriptions  Medication Sig Dispense Refill  . acetaminophen (TYLENOL) 325 MG tablet Take 325 mg by mouth every 6 (six) hours as needed for mild pain.    . furosemide (LASIX) 40  MG tablet Take 20 mg by mouth daily.    . Insulin Glargine (LANTUS SOLOSTAR) 100 UNIT/ML Solostar Pen Inject 20 Units into the skin at bedtime.     . potassium chloride SA (K-DUR,KLOR-CON) 20 MEQ tablet Take 1 tablet (20 mEq total) by mouth daily. (Patient not taking: Reported on 05/20/2017) 30 tablet 2  . simvastatin (ZOCOR) 20 MG tablet Take 20 mg by mouth at bedtime.    Marland Kitchen terazosin (HYTRIN) 5 MG capsule Take 5 mg by mouth at bedtime.    . traMADol (ULTRAM) 50 MG tablet Take 50 mg by mouth at bedtime as needed for pain.  0  . warfarin (COUMADIN) 5 MG tablet TAKE AS DIRECTED BY COUMADIN CLINIC (Patient taking differently:  TAKE AS DIRECTED BY COUMADIN CLINIC. Takes 5mg  daily except Mondays takes 7.5mg .) 50 tablet 3   No current facility-administered medications for this visit.     Allergies:   Codeine and Oxycodone   Social History:  The patient  reports that he has never smoked. He has never used smokeless tobacco. He reports that he does not drink alcohol or use drugs.   Family History:  The patient's family history includes Heart attack in his brother; Heart disease in his brother and mother.    ROS:  Please see the history of present illness.   Otherwise, review of systems is positive for ***.   All other systems are reviewed and negative.     PHYSICAL EXAM: VS:  There were no vitals taken for this visit. , BMI There is no height or weight on file to calculate BMI. GEN: Well nourished, well developed, in no acute distress  HEENT: normal  Neck: no JVD, carotid bruits, or masses Cardiac: ***RRR; no murmurs, rubs, or gallops,no edema  Respiratory:  clear to auscultation bilaterally, normal work of breathing GI: soft, nontender, nondistended, + BS MS: no deformity or atrophy  Skin: warm and dry, ***device site well healed Neuro:  Strength and sensation are intact Psych: euthymic mood, full affect  EKG:  EKG {ACTION; IS/IS ZOX:[id number]} ordered today. Personal review of the ekg ordered *** shows ***   ***Personal review of the device interrogation today. Results in [facility]   Recent Labs: 11/25/2016: B Natriuretic Peptide 1,068.2; Platelets 188 02/04/2017: BUN 46; Creatinine, Ser 1.90; Hemoglobin 10.9; Potassium 3.8; Sodium 139    Lipid Panel  No results found for: CHOL, TRIG, HDL, CHOLHDL, VLDL, LDLCALC, LDLDIRECT   Wt Readings from Last 3 Encounters:  05/11/17 178 lb (80.7 kg)  02/16/17 187 lb (84.8 kg)  02/04/17 176 lb (79.8 kg)      Other studies Reviewed: Additional studies/ records that were reviewed today include: 12/2010 MPI  Review of the above records today demonstrates:    ECG positive for ischemia, myoview with normal perfusion  11/26/16 TTE - Left ventricle: The cavity size was normal. Wall thickness was   increased in a pattern of moderate LVH. Systolic function was   normal. The estimated ejection fraction was in the range of 60%   to 65%. Wall motion was normal; there were no regional wall   motion abnormalities. - Mitral valve: There was moderate regurgitation. - Left atrium: The atrium was moderately dilated. - Right ventricle: The cavity size was mildly dilated. Wall   thickness was normal. - Right atrium: The atrium was severely dilated. - Tricuspid valve: There was moderate regurgitation. - Pulmonary arteries: Systolic pressure was moderately increased.   PA peak pressure: 65 mm Hg (S).  ASSESSMENT AND  PLAN:  1.  Permanent atrial fibrillation: was noted to incidentally have atrial fibrillation. He is minimally some symptomatic with shortness of breath and fatigue. He has a CHADS2VASC score of 4 and therefore does require anticoagulation.  On warfarin for anticoagulation.  An attempt at cardioversion but did not convert him back to normal rhythm. He is minimally symptomatic at this time. He would prefer not to have further attempts at sinus rhythm. We'll continue current management.***  This patients CHA2DS2-VASc Score and unadjusted Ischemic Stroke Rate (% per year) is equal to 4.8 % stroke rate/year from a score of 4  Above score calculated as 1 point each if present [CHF, HTN, DM, Vascular=MI/PAD/Aortic Plaque, Age if 65-74, or Male] Above score calculated as 2 points each if present [Age > 75, or Stroke/TIA/TE]     2. Diastolic heart failure: Currently well compensated today. We'll continue his Lasix. He Faatima Tench likely need Lasix up until he gets cardioverted.***  3. Hyperlipidemia: Continue statin***   Current medicines are reviewed at length with the patient today.   The patient does not have concerns regarding his medicines.  The  following changes were made today:  ***  Labs/ tests ordered today include:  No orders of the defined types were placed in this encounter.    Disposition:   FU with Parnika Tweten ***  months  Signed, Nessie Nong Jorja LoaMartin Roxana Lai, MD  05/20/2017 2:42 PM     [facility] [address] [place] [address] [phone] (office) [phone] (fax)

## 2017-05-24 ENCOUNTER — Encounter (HOSPITAL_COMMUNITY)
Admission: RE | Admit: 2017-05-24 | Discharge: 2017-05-24 | Disposition: A | Discharge status: Home / Self Care | Payer: Medicare Other | Source: Ambulatory Visit | Attending: Neurosurgery | Admitting: Neurosurgery

## 2017-05-24 ENCOUNTER — Encounter (HOSPITAL_COMMUNITY): Payer: Self-pay

## 2017-05-24 ENCOUNTER — Ambulatory Visit: Payer: Medicare Other | Admitting: Cardiology

## 2017-05-24 DIAGNOSIS — M4856XA Collapsed vertebra, not elsewhere classified, lumbar region, initial encounter for fracture: Secondary | ICD-10-CM | POA: Diagnosis not present

## 2017-05-24 DIAGNOSIS — Z01818 Encounter for other preprocedural examination: Secondary | ICD-10-CM | POA: Diagnosis not present

## 2017-05-24 HISTORY — DX: Cardiac arrhythmia, unspecified: I49.9

## 2017-05-24 LAB — CBC
HCT: 34.7 % — ABNORMAL LOW (ref 39.0–52.0)
Hemoglobin: 11.2 g/dL — ABNORMAL LOW (ref 13.0–17.0)
MCH: 29.8 pg (ref 26.0–34.0)
MCHC: 32.3 g/dL (ref 30.0–36.0)
MCV: 92.3 fL (ref 78.0–100.0)
PLATELETS: 193 10*3/uL (ref 150–400)
RBC: 3.76 MIL/uL — AB (ref 4.22–5.81)
RDW: 13.6 % (ref 11.5–15.5)
WBC: 5.8 10*3/uL (ref 4.0–10.5)

## 2017-05-24 LAB — GLUCOSE, CAPILLARY: GLUCOSE-CAPILLARY: 181 mg/dL — AB (ref 65–99)

## 2017-05-24 LAB — BASIC METABOLIC PANEL
Anion gap: 8 (ref 5–15)
BUN: 43 mg/dL — AB (ref 6–20)
CO2: 28 mmol/L (ref 22–32)
CREATININE: 1.73 mg/dL — AB (ref 0.61–1.24)
Calcium: 9.8 mg/dL (ref 8.9–10.3)
Chloride: 102 mmol/L (ref 101–111)
GFR calc Af Amer: 38 mL/min — ABNORMAL LOW (ref >60)
GFR, EST NON AFRICAN AMERICAN: 33 mL/min — AB (ref >60)
GLUCOSE: 184 mg/dL — AB (ref 65–99)
POTASSIUM: 4.4 mmol/L (ref 3.5–5.1)
SODIUM: 138 mmol/L (ref 135–145)

## 2017-05-24 LAB — SURGICAL PCR SCREEN
MRSA, PCR: NEGATIVE
Staphylococcus aureus: NEGATIVE

## 2017-05-24 NOTE — Progress Notes (Signed)
Requested cardiac clearance letter from Shanda BumpsJessica at Dr. Fredrich BirksStern's office.  To be faxed.

## 2017-05-24 NOTE — Progress Notes (Signed)
Patient stated his last dose of coumadin was 05/22/17.  Patient is going [facility] for PT/INR lab draw and will bring results with him to hospital.    Patient's fasting CBG's run 94-120.  He is followed for his diabetes by his PCP, Dr. Shaune Pollackonna Gates.  Dr. Kevan NyGates directed patient to not take any lantus night before surgery.

## 2017-05-24 NOTE — Pre-Procedure Instructions (Addendum)
Daniel Cooke  05/24/2017      [facility] [place], [place] - [id number] [place] [id number] Phone: [phone] Fax: [phone]  [id number] [place] [address] [place] [id number] Phone: [phone] Fax: [phone]    Your procedure is scheduled on    Friday  05/28/17  [facility]  Call this number if you have problems the morning of surgery:  [phone]   Remember:  Do not eat food or drink liquids after midnight.  Take these medicines the morning of surgery with A SIP OF WATER   TRAMADOL IF NEEDED  7 days prior to surgery STOP taking any Aspirin, Aleve, Naproxen, Ibuprofen, Motrin, Advil, Goody's, BC's, all herbal medications, fish oil, and all vitamins.  STOP COUMADIN 5 DAYS PRIOR TO SURGERY .    How to Manage Your Diabetes Before and After Surgery  Why is it important to control my blood sugar before and after surgery? . Improving blood sugar levels before and after surgery helps healing and can limit problems. . A way of improving blood sugar control is eating a healthy diet by: o  Eating less sugar and carbohydrates o  Increasing activity/exercise o  Talking with your doctor about reaching your blood sugar goals . High blood sugars (greater than 180 mg/dL) can raise your risk of infections and slow your recovery, so you will need to focus on controlling your diabetes during the weeks before surgery. . Make sure that the doctor who takes care of your diabetes knows about your planned surgery including the date and location.  How do I manage my blood sugar before surgery? . Check your blood sugar at least 4 times a day, starting 2 days before surgery, to make sure that the level is not too high or low. o Check your blood sugar the morning of your surgery when you wake up and every 2 hours until you get to the Short Stay  unit. . If your blood sugar is less than 70 mg/dL, you will need to treat for low blood sugar: o Do not take insulin. o Treat a low blood sugar (less than 70 mg/dL) with  cup of clear juice (cranberry or apple), 4 glucose tablets, OR glucose gel. o Recheck blood sugar in 15 minutes after treatment (to make sure it is greater than 70 mg/dL). If your blood sugar is not greater than 70 mg/dL on recheck, call [phone] for further instructions. . Report your blood sugar to the short stay nurse when you get to Short Stay.  . If you are admitted to the hospital after surgery: o Your blood sugar will be checked by the staff and you will probably be given insulin after surgery (instead of oral diabetes medicines) to make sure you have good blood sugar levels. o The goal for blood sugar control after surgery is 80-180 mg/dL.              WHAT DO I DO ABOUT MY DIABETES MEDICATION?   Marland Kitchen. Do not take oral diabetes medicines (pills) the morning of surgery.  . THE NIGHT BEFORE SURGERY, take ___________ units of ___________insulin.       Marland Kitchen. HE MORNING OF SURGERY, take _____________ units of __________insulin.  . The day of surgery, do not take other diabetes injectables, including Byetta (exenatide), Bydureon (exenatide ER), Victoza (liraglutide), or Trulicity (dulaglutide).  . If  your CBG is greater than 220 mg/dL, you may take  of your sliding scale (correction) dose of insulin.  Other Instructions:          Patient Signature:  Date:   Nurse Signature:  Date:   Reviewed and Endorsed by [facility] Patient Education Committee, August 2015  Do not wear jewelry, make-up or nail polish.  Do not wear lotions, powders, or perfumes, or deoderant.  Do not shave 48 hours prior to surgery.  Men may shave face and neck.  Do not bring valuables to the hospital.  [facility] is not responsible for any belongings or valuables.  Contacts, dentures or bridgework may not be worn into  surgery.  Leave your suitcase in the car.  After surgery it may be brought to your room.  For patients admitted to the hospital, discharge time will be determined by your treatment team.  Patients discharged the day of surgery will not be allowed to drive home.   Name and phone number of your driver:    Special instructions:  [place] - Preparing for Surgery  Before surgery, you can play an important role.  Because skin is not sterile, your skin needs to be as free of germs as possible.  You can reduce the number of germs on you skin by washing with CHG (chlorahexidine gluconate) soap before surgery.  CHG is an antiseptic cleaner which kills germs and bonds with the skin to continue killing germs even after washing.  Please DO NOT use if you have an allergy to CHG or antibacterial soaps.  If your skin becomes reddened/irritated stop using the CHG and inform your nurse when you arrive at Short Stay.  Do not shave (including legs and underarms) for at least 48 hours prior to the first CHG shower.  You may shave your face.  Please follow these instructions carefully:   1.  Shower with CHG Soap the night before surgery and the                                morning of Surgery.  2.  If you choose to wash your hair, wash your hair first as usual with your       normal shampoo.  3.  After you shampoo, rinse your hair and body thoroughly to remove the                      Shampoo.  4.  Use CHG as you would any other liquid soap.  You can apply chg directly       to the skin and wash gently with scrungie or a clean washcloth.  5.  Apply the CHG Soap to your body ONLY FROM THE NECK DOWN.        Do not use on open wounds or open sores.  Avoid contact with your eyes,       ears, mouth and genitals (private parts).  Wash genitals (private parts)       with your normal soap.  6.  Wash thoroughly, paying special attention to the area where your surgery        will be performed.  7.  Thoroughly rinse your  body with warm water from the neck down.  8.  DO NOT shower/wash with your normal soap after using and rinsing off       the CHG Soap.  9.  Pat yourself  dry with a clean towel.            10.  Wear clean pajamas.            11.  Place clean sheets on your bed the night of your first shower and do not        sleep with pets.  Day of Surgery  Do not apply any lotions/deoderants the morning of surgery.  Please wear clean clothes to the hospital/surgery center.    Please read over the following fact sheets that you were given. MRSA Information and Surgical Site Infection Prevention

## 2017-05-26 NOTE — Progress Notes (Signed)
Anesthesia Chart Review:  Pt is a 83 year old male scheduled for L4 kyphoplasty on 05/28/2017 with Maeola HarmanJoseph Stern, M.D.  - PCP is Shaune Pollackonna Gates, MD who is aware of upcoming surgery - Cardiologist is Will Elberta Fortisamnitz, MD who cleared pt for surgery  PMH includes: atrial fibrillation (s/p cardioversion 02/04/17- unsuccessful), HTN, DM, hyperlipidemia, CHF.  Never smoker. BMI 26. S/p K knee arthroscopy 02/28/14.  S/p L TKA 02/08/12.    Medications include: Lasix, Lantus, terazosin, Coumadin. Patient stopped Coumadin 5 days before surgery.  Preoperative labs reviewed.   - Glucose 184. HbA1c was 9.0 on 04/02/17.   - Cr 1.73, BUN 43.  - PT will be done in anti-coag clinic the morning of surgery  CXR 11/25/16: Mild CHF with small effusions.  EKG 02/16/17: Atrial fibrillation with slow ventricular response (58 bpm) with premature aberrantly conducted, axis. RSR' or QR pattern in V1 suggests RV conduction delay.  Echo 11/26/16:  - Left ventricle: The cavity size was normal. Wall thickness was increased in a pattern of moderate LVH. Systolic function was normal. The estimated ejection fraction was in the range of 60% to 65%. Wall motion was normal; there were no regional wall motion abnormalities. - Mitral valve: There was moderate regurgitation. - Left atrium: The atrium was moderately dilated. - Right ventricle: The cavity size was mildly dilated. Wall thickness was normal. - Right atrium: The atrium was severely dilated. - Tricuspid valve: There was moderate regurgitation. - Pulmonary arteries: Systolic pressure was moderately increased. PA peak pressure: 65 mm Hg (S).  Nuclear stress test 01/05/11: Electrically positive for ischemia.  Myoview with normal perfusion.  If PT acceptable DOS, I anticipate pt can proceed with surgery as scheduled.   Rica Mastngela Samaya Boardley, FNP-BC [facility]iology Phone: [phone] 05/26/2017 12:15 PM

## 2017-05-27 MED ORDER — CEFAZOLIN SODIUM-DEXTROSE 2-4 GM/100ML-% IV SOLN
2.0000 g | INTRAVENOUS | Status: AC
  Administered 2017-05-28: 2 g via INTRAVENOUS
  Filled 2017-05-27: qty 100

## 2017-05-28 ENCOUNTER — Encounter: Payer: Self-pay | Admitting: Cardiology

## 2017-05-28 ENCOUNTER — Inpatient Hospital Stay (HOSPITAL_COMMUNITY)
Admission: AD | Admit: 2017-05-28 | Discharge: 2017-05-28 | DRG: 517 | Disposition: A | Discharge status: Home / Self Care | Payer: Medicare Other | Source: Ambulatory Visit | Attending: Neurosurgery | Admitting: Neurosurgery

## 2017-05-28 ENCOUNTER — Ambulatory Visit (INDEPENDENT_AMBULATORY_CARE_PROVIDER_SITE_OTHER): Payer: Medicare Other | Admitting: *Deleted

## 2017-05-28 ENCOUNTER — Encounter (HOSPITAL_COMMUNITY): Payer: Self-pay | Admitting: *Deleted

## 2017-05-28 ENCOUNTER — Encounter (HOSPITAL_COMMUNITY)
Admission: AD | Disposition: A | Discharge status: Home / Self Care | Payer: Self-pay | Source: Ambulatory Visit | Attending: Neurosurgery

## 2017-05-28 ENCOUNTER — Ambulatory Visit (HOSPITAL_COMMUNITY): Payer: Medicare Other | Admitting: Certified Registered"

## 2017-05-28 ENCOUNTER — Inpatient Hospital Stay (HOSPITAL_COMMUNITY): Payer: Medicare Other

## 2017-05-28 ENCOUNTER — Ambulatory Visit (HOSPITAL_COMMUNITY): Payer: Medicare Other | Admitting: Vascular Surgery

## 2017-05-28 DIAGNOSIS — Z7901 Long term (current) use of anticoagulants: Secondary | ICD-10-CM | POA: Diagnosis not present

## 2017-05-28 DIAGNOSIS — M47816 Spondylosis without myelopathy or radiculopathy, lumbar region: Secondary | ICD-10-CM | POA: Diagnosis present

## 2017-05-28 DIAGNOSIS — E1122 Type 2 diabetes mellitus with diabetic chronic kidney disease: Secondary | ICD-10-CM | POA: Diagnosis not present

## 2017-05-28 DIAGNOSIS — X58XXXS Exposure to other specified factors, sequela: Secondary | ICD-10-CM | POA: Diagnosis present

## 2017-05-28 DIAGNOSIS — I482 Chronic atrial fibrillation, unspecified: Secondary | ICD-10-CM

## 2017-05-28 DIAGNOSIS — Z419 Encounter for procedure for purposes other than remedying health state, unspecified: Secondary | ICD-10-CM

## 2017-05-28 DIAGNOSIS — Z96652 Presence of left artificial knee joint: Secondary | ICD-10-CM | POA: Diagnosis not present

## 2017-05-28 DIAGNOSIS — M5116 Intervertebral disc disorders with radiculopathy, lumbar region: Secondary | ICD-10-CM | POA: Diagnosis present

## 2017-05-28 DIAGNOSIS — I10 Essential (primary) hypertension: Secondary | ICD-10-CM | POA: Diagnosis present

## 2017-05-28 DIAGNOSIS — Z885 Allergy status to narcotic agent status: Secondary | ICD-10-CM | POA: Diagnosis not present

## 2017-05-28 DIAGNOSIS — Z7984 Long term (current) use of oral hypoglycemic drugs: Secondary | ICD-10-CM | POA: Diagnosis not present

## 2017-05-28 DIAGNOSIS — N183 Chronic kidney disease, stage 3 (moderate): Secondary | ICD-10-CM | POA: Diagnosis not present

## 2017-05-28 DIAGNOSIS — S32040A Wedge compression fracture of fourth lumbar vertebra, initial encounter for closed fracture: Secondary | ICD-10-CM | POA: Diagnosis present

## 2017-05-28 DIAGNOSIS — E119 Type 2 diabetes mellitus without complications: Secondary | ICD-10-CM | POA: Diagnosis not present

## 2017-05-28 DIAGNOSIS — S32010S Wedge compression fracture of first lumbar vertebra, sequela: Principal | ICD-10-CM

## 2017-05-28 DIAGNOSIS — M4856XA Collapsed vertebra, not elsewhere classified, lumbar region, initial encounter for fracture: Secondary | ICD-10-CM | POA: Diagnosis not present

## 2017-05-28 DIAGNOSIS — Z5181 Encounter for therapeutic drug level monitoring: Secondary | ICD-10-CM

## 2017-05-28 DIAGNOSIS — I129 Hypertensive chronic kidney disease with stage 1 through stage 4 chronic kidney disease, or unspecified chronic kidney disease: Secondary | ICD-10-CM | POA: Diagnosis not present

## 2017-05-28 HISTORY — PX: KYPHOPLASTY: SHX5884

## 2017-05-28 LAB — GLUCOSE, CAPILLARY
GLUCOSE-CAPILLARY: 179 mg/dL — AB (ref 65–99)
Glucose-Capillary: 159 mg/dL — ABNORMAL HIGH (ref 65–99)

## 2017-05-28 LAB — POCT INR: INR: 1.2

## 2017-05-28 SURGERY — KYPHOPLASTY
Anesthesia: General

## 2017-05-28 MED ORDER — ARTIFICIAL TEARS OPHTHALMIC OINT
TOPICAL_OINTMENT | OPHTHALMIC | Status: AC
  Filled 2017-05-28: qty 3.5

## 2017-05-28 MED ORDER — KCL IN DEXTROSE-NACL 20-5-0.45 MEQ/L-%-% IV SOLN: INTRAVENOUS | Status: DC

## 2017-05-28 MED ORDER — ACETAMINOPHEN 325 MG PO TABS: 650.0000 mg | ORAL_TABLET | ORAL | Status: DC | PRN

## 2017-05-28 MED ORDER — ACETAMINOPHEN 325 MG PO TABS: 325.0000 mg | ORAL_TABLET | Freq: Four times a day (QID) | ORAL | Status: DC | PRN

## 2017-05-28 MED ORDER — IOPAMIDOL (ISOVUE-300) INJECTION 61%
INTRAVENOUS | Status: DC | PRN
Start: 2017-05-28 — End: 2017-05-28
  Administered 2017-05-28: 50 mL

## 2017-05-28 MED ORDER — TRAMADOL HCL 50 MG PO TABS: 50.0000 mg | ORAL_TABLET | Freq: Four times a day (QID) | ORAL | Status: DC | PRN

## 2017-05-28 MED ORDER — METHOCARBAMOL 500 MG PO TABS: 500.0000 mg | ORAL_TABLET | Freq: Four times a day (QID) | ORAL | Status: DC | PRN

## 2017-05-28 MED ORDER — TERAZOSIN HCL 5 MG PO CAPS
5.0000 mg | ORAL_CAPSULE | Freq: Every day | ORAL | Status: DC
  Filled 2017-05-28: qty 1

## 2017-05-28 MED ORDER — SODIUM CHLORIDE 0.9% FLUSH: 3.0000 mL | Freq: Two times a day (BID) | INTRAVENOUS | Status: DC

## 2017-05-28 MED ORDER — SIMVASTATIN 20 MG PO TABS: 20.0000 mg | ORAL_TABLET | Freq: Every day | ORAL | Status: DC

## 2017-05-28 MED ORDER — LIDOCAINE 2% (20 MG/ML) 5 ML SYRINGE
INTRAMUSCULAR | Status: AC
  Filled 2017-05-28: qty 5

## 2017-05-28 MED ORDER — DOCUSATE SODIUM 100 MG PO CAPS: 100.0000 mg | ORAL_CAPSULE | Freq: Two times a day (BID) | ORAL | Status: DC

## 2017-05-28 MED ORDER — HYDROCODONE-ACETAMINOPHEN 5-325 MG PO TABS: 1.0000 | ORAL_TABLET | ORAL | Status: DC | PRN

## 2017-05-28 MED ORDER — ARTIFICIAL TEARS OPHTHALMIC OINT
TOPICAL_OINTMENT | OPHTHALMIC | Status: DC | PRN
  Administered 2017-05-28: 1 via OPHTHALMIC

## 2017-05-28 MED ORDER — DEXTROSE 5 % IV SOLN: 500.0000 mg | Freq: Four times a day (QID) | INTRAVENOUS | Status: DC | PRN

## 2017-05-28 MED ORDER — LACTATED RINGERS IV SOLN
INTRAVENOUS | Status: DC
  Administered 2017-05-28 (×2): via INTRAVENOUS

## 2017-05-28 MED ORDER — CEFAZOLIN SODIUM-DEXTROSE 2-4 GM/100ML-% IV SOLN: 2.0000 g | Freq: Three times a day (TID) | INTRAVENOUS | Status: DC

## 2017-05-28 MED ORDER — ALUM & MAG HYDROXIDE-SIMETH 200-200-20 MG/5ML PO SUSP: 30.0000 mL | Freq: Four times a day (QID) | ORAL | Status: DC | PRN

## 2017-05-28 MED ORDER — EPHEDRINE 5 MG/ML INJ
INTRAVENOUS | Status: AC
  Filled 2017-05-28: qty 10

## 2017-05-28 MED ORDER — INSULIN GLARGINE 100 UNIT/ML ~~LOC~~ SOLN
20.0000 [IU] | Freq: Every day | SUBCUTANEOUS | Status: DC
  Filled 2017-05-28: qty 0.2

## 2017-05-28 MED ORDER — FUROSEMIDE 20 MG PO TABS: 20.0000 mg | ORAL_TABLET | Freq: Every day | ORAL | Status: DC

## 2017-05-28 MED ORDER — CHLORHEXIDINE GLUCONATE CLOTH 2 % EX PADS: 6.0000 | MEDICATED_PAD | Freq: Once | CUTANEOUS | Status: DC

## 2017-05-28 MED ORDER — LIDOCAINE-EPINEPHRINE 1 %-1:100000 IJ SOLN
INTRAMUSCULAR | Status: DC | PRN
  Administered 2017-05-28: 5 mL

## 2017-05-28 MED ORDER — ACETAMINOPHEN 650 MG RE SUPP: 650.0000 mg | RECTAL | Status: DC | PRN

## 2017-05-28 MED ORDER — TRAMADOL HCL 50 MG PO TABS: 50.0000 mg | ORAL_TABLET | Freq: Four times a day (QID) | ORAL | 0 refills | Status: DC | PRN

## 2017-05-28 MED ORDER — 0.9 % SODIUM CHLORIDE (POUR BTL) OPTIME
TOPICAL | Status: DC | PRN
  Administered 2017-05-28: 1000 mL

## 2017-05-28 MED ORDER — BUPIVACAINE HCL (PF) 0.5 % IJ SOLN
INTRAMUSCULAR | Status: DC | PRN
  Administered 2017-05-28: 5 mL

## 2017-05-28 MED ORDER — PHENYLEPHRINE 40 MCG/ML (10ML) SYRINGE FOR IV PUSH (FOR BLOOD PRESSURE SUPPORT)
PREFILLED_SYRINGE | INTRAVENOUS | Status: AC
  Filled 2017-05-28: qty 10

## 2017-05-28 MED ORDER — SUGAMMADEX SODIUM 200 MG/2ML IV SOLN
INTRAVENOUS | Status: DC | PRN
  Administered 2017-05-28: 300 mg via INTRAVENOUS

## 2017-05-28 MED ORDER — FENTANYL CITRATE (PF) 250 MCG/5ML IJ SOLN
INTRAMUSCULAR | Status: AC
  Filled 2017-05-28: qty 5

## 2017-05-28 MED ORDER — PROPOFOL 10 MG/ML IV BOLUS
INTRAVENOUS | Status: DC | PRN
  Administered 2017-05-28 (×3): 20 mg via INTRAVENOUS
  Administered 2017-05-28: 80 mg via INTRAVENOUS

## 2017-05-28 MED ORDER — SODIUM CHLORIDE 0.9 % IV SOLN: 250.0000 mL | INTRAVENOUS | Status: DC

## 2017-05-28 MED ORDER — TRAMADOL HCL 50 MG PO TABS: 50.0000 mg | ORAL_TABLET | Freq: Two times a day (BID) | ORAL | Status: DC | PRN

## 2017-05-28 MED ORDER — IOPAMIDOL (ISOVUE-300) INJECTION 61%
INTRAVENOUS | Status: AC
  Filled 2017-05-28: qty 50

## 2017-05-28 MED ORDER — ROCURONIUM BROMIDE 10 MG/ML (PF) SYRINGE
PREFILLED_SYRINGE | INTRAVENOUS | Status: AC
  Filled 2017-05-28: qty 5

## 2017-05-28 MED ORDER — SENNOSIDES-DOCUSATE SODIUM 8.6-50 MG PO TABS: 1.0000 | ORAL_TABLET | Freq: Every evening | ORAL | Status: DC | PRN

## 2017-05-28 MED ORDER — MORPHINE SULFATE (PF) 4 MG/ML IV SOLN: 1.0000 mg | INTRAVENOUS | Status: DC | PRN

## 2017-05-28 MED ORDER — EPHEDRINE SULFATE-NACL 50-0.9 MG/10ML-% IV SOSY
PREFILLED_SYRINGE | INTRAVENOUS | Status: DC | PRN
  Administered 2017-05-28 (×3): 10 mg via INTRAVENOUS

## 2017-05-28 MED ORDER — PANTOPRAZOLE SODIUM 40 MG IV SOLR: 40.0000 mg | Freq: Every day | INTRAVENOUS | Status: DC

## 2017-05-28 MED ORDER — ONDANSETRON HCL 4 MG/2ML IJ SOLN
INTRAMUSCULAR | Status: DC | PRN
  Administered 2017-05-28: 4 mg via INTRAVENOUS

## 2017-05-28 MED ORDER — SUGAMMADEX SODIUM 500 MG/5ML IV SOLN
INTRAVENOUS | Status: AC
  Filled 2017-05-28: qty 5

## 2017-05-28 MED ORDER — ROCURONIUM BROMIDE 100 MG/10ML IV SOLN
INTRAVENOUS | Status: DC | PRN
  Administered 2017-05-28: 50 mg via INTRAVENOUS

## 2017-05-28 MED ORDER — PHENYLEPHRINE HCL 10 MG/ML IJ SOLN
INTRAVENOUS | Status: DC | PRN
  Administered 2017-05-28: 25 ug/min via INTRAVENOUS

## 2017-05-28 MED ORDER — ONDANSETRON HCL 4 MG PO TABS: 4.0000 mg | ORAL_TABLET | Freq: Four times a day (QID) | ORAL | Status: DC | PRN

## 2017-05-28 MED ORDER — FENTANYL CITRATE (PF) 100 MCG/2ML IJ SOLN
INTRAMUSCULAR | Status: DC | PRN
  Administered 2017-05-28: 25 ug via INTRAVENOUS
  Administered 2017-05-28 (×2): 50 ug via INTRAVENOUS

## 2017-05-28 MED ORDER — CEFAZOLIN SODIUM-DEXTROSE 2-4 GM/100ML-% IV SOLN: 2.0000 g | Freq: Two times a day (BID) | INTRAVENOUS | Status: DC

## 2017-05-28 MED ORDER — ONDANSETRON HCL 4 MG/2ML IJ SOLN: 4.0000 mg | Freq: Four times a day (QID) | INTRAMUSCULAR | Status: DC | PRN

## 2017-05-28 MED ORDER — PROPOFOL 10 MG/ML IV BOLUS
INTRAVENOUS | Status: AC
  Filled 2017-05-28: qty 20

## 2017-05-28 MED ORDER — SODIUM CHLORIDE 0.9% FLUSH: 3.0000 mL | INTRAVENOUS | Status: DC | PRN

## 2017-05-28 MED ORDER — FENTANYL CITRATE (PF) 100 MCG/2ML IJ SOLN: 25.0000 ug | INTRAMUSCULAR | Status: DC | PRN

## 2017-05-28 MED ORDER — ONDANSETRON HCL 4 MG/2ML IJ SOLN
INTRAMUSCULAR | Status: AC
  Filled 2017-05-28: qty 2

## 2017-05-28 MED ORDER — PHENOL 1.4 % MT LIQD: 1.0000 | OROMUCOSAL | Status: DC | PRN

## 2017-05-28 MED ORDER — MENTHOL 3 MG MT LOZG: 1.0000 | LOZENGE | OROMUCOSAL | Status: DC | PRN

## 2017-05-28 MED ORDER — LIDOCAINE-EPINEPHRINE 1 %-1:100000 IJ SOLN
INTRAMUSCULAR | Status: AC
  Filled 2017-05-28: qty 1

## 2017-05-28 SURGICAL SUPPLY — 44 items
ADH SKN CLS APL DERMABOND .7 (GAUZE/BANDAGES/DRESSINGS) ×1
BLADE CLIPPER SURG (BLADE) IMPLANT
BLADE SURG 15 STRL LF DISP TIS (BLADE) ×1 IMPLANT
BLADE SURG 15 STRL SS (BLADE) ×2
CARTRIDGE OIL MAESTRO DRILL (MISCELLANEOUS) ×1 IMPLANT
CEMENT KYPHON C01A KIT/MIXER (Cement) ×1 IMPLANT
CONT SPEC 4OZ CLIKSEAL STRL BL (MISCELLANEOUS) ×3 IMPLANT
DECANTER SPIKE VIAL GLASS SM (MISCELLANEOUS) ×2 IMPLANT
DERMABOND ADVANCED (GAUZE/BANDAGES/DRESSINGS) ×1
DERMABOND ADVANCED .7 DNX12 (GAUZE/BANDAGES/DRESSINGS) ×1 IMPLANT
DIFFUSER DRILL AIR PNEUMATIC (MISCELLANEOUS) ×1 IMPLANT
DRAPE C-ARM 42X72 X-RAY (DRAPES) ×2 IMPLANT
DRAPE HALF SHEET 40X57 (DRAPES) ×2 IMPLANT
DRAPE LAPAROTOMY 100X72X124 (DRAPES) ×2 IMPLANT
DRAPE SURG 17X23 STRL (DRAPES) ×2 IMPLANT
DRAPE WARM FLUID 44X44 (DRAPE) ×2 IMPLANT
DURAPREP 26ML APPLICATOR (WOUND CARE) ×2 IMPLANT
GAUZE SPONGE 4X4 16PLY XRAY LF (GAUZE/BANDAGES/DRESSINGS) ×2 IMPLANT
GLOVE BIO SURGEON STRL SZ8 (GLOVE) ×2 IMPLANT
GLOVE BIOGEL PI IND STRL 8.5 (GLOVE) ×1 IMPLANT
GLOVE BIOGEL PI INDICATOR 8.5 (GLOVE) ×1
GLOVE EXAM NITRILE LRG STRL (GLOVE) IMPLANT
GLOVE EXAM NITRILE XL STR (GLOVE) IMPLANT
GLOVE EXAM NITRILE XS STR PU (GLOVE) IMPLANT
GOWN STRL REUS W/ TWL LRG LVL3 (GOWN DISPOSABLE) IMPLANT
GOWN STRL REUS W/ TWL XL LVL3 (GOWN DISPOSABLE) IMPLANT
GOWN STRL REUS W/TWL 2XL LVL3 (GOWN DISPOSABLE) IMPLANT
GOWN STRL REUS W/TWL LRG LVL3 (GOWN DISPOSABLE)
GOWN STRL REUS W/TWL XL LVL3 (GOWN DISPOSABLE)
KIT BASIN OR (CUSTOM PROCEDURE TRAY) ×2 IMPLANT
KIT ROOM TURNOVER OR (KITS) ×2 IMPLANT
NDL HYPO 25X1 1.5 SAFETY (NEEDLE) ×1 IMPLANT
NEEDLE HYPO 25X1 1.5 SAFETY (NEEDLE) ×2 IMPLANT
NS IRRIG 1000ML POUR BTL (IV SOLUTION) ×2 IMPLANT
OIL CARTRIDGE MAESTRO DRILL (MISCELLANEOUS)
PACK SURGICAL SETUP 50X90 (CUSTOM PROCEDURE TRAY) ×2 IMPLANT
PAD ARMBOARD 7.5X6 YLW CONV (MISCELLANEOUS) ×6 IMPLANT
STAPLER SKIN PROX WIDE 3.9 (STAPLE) ×2 IMPLANT
SUT VIC AB 3-0 SH 8-18 (SUTURE) ×2 IMPLANT
SYR CONTROL 10ML LL (SYRINGE) ×4 IMPLANT
TOWEL GREEN STERILE (TOWEL DISPOSABLE) ×2 IMPLANT
TOWEL GREEN STERILE FF (TOWEL DISPOSABLE) ×2 IMPLANT
TRAY KYPHOPAK 15/3 ONESTEP 1ST (MISCELLANEOUS) ×1 IMPLANT
TRAY KYPHOPAK 20/3 ONESTEP 1ST (MISCELLANEOUS) IMPLANT

## 2017-05-28 NOTE — Discharge Instructions (Signed)
Wound Care Leave incision open to air. You may shower. Do not scrub directly on incision.  Do not put any creams, lotions, or ointments on incision. Activity Walk each and every day, increasing distance each day. No lifting greater than 5 lbs.  Avoid bending, arching, and twisting. No driving for 2 weeks; may ride as a passenger locally. If provided with back brace, wear when out of bed.  It is not necessary to wear in bed. Diet Resume your normal diet.  Return to Work Will be discussed at you follow up appointment. Call Your Doctor If Any of These Occur Redness, drainage, or swelling at the wound.  Temperature greater than 101 degrees. Severe pain not relieved by pain medication. Incision starts to come apart. Follow Up Appt Call today for appointment in 3-4 weeks ([phone]) or for problems.  If you have any hardware placed in your spine, you will need an x-ray before your appointment.   Balloon Kyphoplasty, Care After Refer to this sheet in the next few weeks. These instructions provide you with information about caring for yourself after your procedure. Your health care provider may also give you more specific instructions. Your treatment has been planned according to current medical practices, but problems sometimes occur. Call your health care provider if you have any problems or questions after your procedure. What can I expect after the procedure? After your procedure, it is common to have back pain. Follow these instructions at home: Incision care  Follow instructions from your health care provider about how to take care of your incisions. Make sure you: ? Wash your hands with soap and water before you change your bandage (dressing). If soap and water are not available, use hand sanitizer. ? Change your dressing as told by your health care provider. ? Leave stitches (sutures), skin glue, or adhesive strips in place. These skin closures may need to be in place for 2 weeks or  longer. If adhesive strip edges start to loosen and curl up, you may trim the loose edges. Do not remove adhesive strips completely unless your health care provider tells you to do that.  Check your incision area every day for signs of infection. Watch for: ? Redness, swelling, or pain. ? Fluid, blood, or pus.  Keep your dressing dry until your health care provider says that it can be removed. Activity   Rest your back and avoid intense physical activity for as long as told by your health care provider.  Return to your normal activities as told by your health care provider. Ask your health care provider what activities are safe for you.  Do not lift anything that is heavier than 10 lb (4.5 kg). This is about the weight of a gallon of milk.You may need to avoid heavy lifting for several weeks. General instructions  Take over-the-counter and prescription medicines only as told by your health care provider.  If directed, apply ice to the painful area: ? Put ice in a plastic bag. ? Place a towel between your skin and the bag. ? Leave the ice on for 20 minutes, 2-3 times per day.  Do not use tobacco products, including cigarettes, chewing tobacco, or e-cigarettes. If you need help quitting, ask your health care provider.  Keep all follow-up visits as told by your health care provider. This is important. Contact a health care provider if:  You have a fever.  You have redness, swelling, or pain at the site of your incisions.  You have fluid,  blood, or pus coming from your incisions.  You have pain that gets worse or does not get better with medicine.  You develop numbness or weakness in any part of your body. Get help right away if:  You have chest pain.  You have difficulty breathing.  You cannot move your legs.  You cannot control your bladder or bowel movements.  You suddenly become weak or numb on one side of your body.  You become very confused.  You have trouble  speaking or understanding, or both. This information is not intended to replace advice given to you by your health care provider. Make sure you discuss any questions you have with your health care provider. Document Released: 08/21/2015 Document Revised: 05/07/2016 Document Reviewed: 03/25/2015 Elsevier Interactive Patient Education  [employer]2018 Elsevier Inc.

## 2017-05-28 NOTE — Evaluation (Signed)
Physical Therapy Evaluation Patient Details Name: Daniel Cooke MRN: [id number] DOB: 10-31-1924 Today's Date: 05/28/2017   History of Present Illness  Pt is a 83 yo male admitted on 05/28/17 for L4 kyphoplasty due to compression fx. PMH significant for A-fib s/p unsuccessful cardioversion 01/15/17, HTN, DM, HLD, CHF, L TKA 02/08/12, R knee arthroscopy 02/28/14.  Clinical Impression  Pt presents with the above diagnosis and below deficits for therapy evaluation. Prior to admission, pt lived alone in a single level home. Pt was still working every weekend at a [employer]. Pt is able to mobilize including bed mobs and gait with supervision to Mod I this session and is progressing toward independent. Pt's daughters will be with him when he returns home. Pt will not require any therapy follow-up acutely or after discharge. PT will sign off. Please re-order if any new needs arise.     Follow Up Recommendations No PT follow up    Equipment Recommendations  None recommended by PT    Recommendations for Other Services       Precautions / Restrictions Precautions Precautions: Fall;Back Precaution Booklet Issued: No Precaution Comments: instruction on back precuations, none in chart Restrictions Weight Bearing Restrictions: No      Mobility  Bed Mobility Overal bed mobility: Modified Independent             General bed mobility comments: able to get EOB with increased time, no hands on assistance  Transfers Overall transfer level: Needs assistance Equipment used: None Transfers: Sit to/from Stand Sit to Stand: Min guard         General transfer comment: min guard for safety, supervision once standing to Mod I  Ambulation/Gait Ambulation/Gait assistance: Min guard Ambulation Distance (Feet): 150 Feet Assistive device: None Gait Pattern/deviations: Step-through pattern Gait velocity: decreased Gait velocity interpretation: Below normal speed for age/gender General Gait  Details: slower cadence, slow steady gait pattern. Good sequencing and no LOB  Stairs            Wheelchair Mobility    Modified Rankin (Stroke Patients Only)       Balance Overall balance assessment: Needs assistance Sitting-balance support: No upper extremity supported;Feet supported Sitting balance-Leahy Scale: Normal     Standing balance support: No upper extremity supported Standing balance-Leahy Scale: Good                               Pertinent Vitals/Pain Pain Assessment: Faces Faces Pain Scale: Hurts a little bit Pain Location: low back with gait Pain Descriptors / Indicators: Aching;Grimacing Pain Intervention(s): Monitored during session;Repositioned;Premedicated before session    Home Living Family/patient expects to be discharged to:: Private residence Living Arrangements: Alone Available Help at Discharge: Family;Available 24 hours/day Type of Home: House Home Access: Stairs to enter Entrance Stairs-Rails: None Entrance Stairs-Number of Steps: 3 Home Layout: One level Home Equipment: Walker - 2 wheels;Cane - single point      Prior Function Level of Independence: Independent         Comments: completely independent, working every weekend at a flea market     Hand Dominance   Dominant Hand: Right    Extremity/Trunk Assessment   Upper Extremity Assessment Upper Extremity Assessment: Defer to OT evaluation    Lower Extremity Assessment Lower Extremity Assessment: Overall WFL for tasks assessed    Cervical / Trunk Assessment Cervical / Trunk Assessment: Normal  Communication   Communication: No difficulties  Cognition Arousal/Alertness: Awake/alert  Behavior During Therapy: WFL for tasks assessed/performed Overall Cognitive Status: Within Functional Limits for tasks assessed                                        General Comments General comments (skin integrity, edema, etc.): Daugthers present  throughout session    Exercises     Assessment/Plan    PT Assessment Patent does not need any further PT services  PT Problem List         PT Treatment Interventions      PT Goals (Current goals can be found in the Care Plan section)  Acute Rehab PT Goals Patient Stated Goal: to get home today and back to work PT Goal Formulation: With patient/family    Frequency     Barriers to discharge        Co-evaluation               AM-PAC PT "6 Clicks" Daily Activity  Outcome Measure Difficulty turning over in bed (including adjusting bedclothes, sheets and blankets)?: None Difficulty moving from lying on back to sitting on the side of the bed? : None Difficulty sitting down on and standing up from a chair with arms (e.g., wheelchair, bedside commode, etc,.)?: None Help needed moving to and from a bed to chair (including a wheelchair)?: None Help needed walking in hospital room?: None Help needed climbing 3-5 steps with a railing? : None 6 Click Score: 24    End of Session Equipment Utilized During Treatment: Gait belt Activity Tolerance: Patient tolerated treatment well Patient left: in bed;with call bell/phone within reach;with family/visitor present Nurse Communication: Mobility status PT Visit Diagnosis: Unsteadiness on feet (R26.81)    Time: [id number] PT Time Calculation (min) (ACUTE ONLY): 13 min   Charges:   PT Evaluation $PT Eval Moderate Complexity: 1 Procedure     PT G Codes:   PT G-Codes **NOT FOR INPATIENT CLASS** Functional Assessment Tool Used: AM-PAC 6 Clicks Basic Mobility;Clinical judgement Functional Limitation: Mobility: Walking and moving around Mobility: Walking and Moving Around Current Status (H0865(G8978): 0 percent impaired, limited or restricted Mobility: Walking and Moving Around Goal Status (H8469(G8979): 0 percent impaired, limited or restricted Mobility: Walking and Moving Around Discharge Status (G2952(G8980): 0 percent impaired, limited or  restricted    Colin BroachSabra M. Briar Sword PT, DPT  [phone]   Roxy MannsSabra Marie Jamil Castillo 05/28/2017, 4:17 PM

## 2017-05-28 NOTE — Op Note (Signed)
05/28/2017  2:40 PM  PATIENT:  Daniel Cooke  83 y.o. male  PRE-OPERATIVE DIAGNOSIS:  Compression fracture L 4, lumbago  POST-OPERATIVE DIAGNOSIS:  Compression fracture L4, lumbago  PROCEDURE:  Procedure(s) with comments: Lumbar Four Kyphoplasty (N/A) - L4 Kyphoplasty  SURGEON:  Surgeon(s) and Role:    * Nobel Brar, MD - Primary  PHYSICIAN ASSISTANT:   ASSISTANTS: None   ANESTHESIA:   general  EBL:  Total I/O In: 500 [I.V.:500] Out: -   BLOOD ADMINISTERED:none  DRAINS: none   LOCAL MEDICATIONS USED:  MARCAINE    and LIDOCAINE   SPECIMEN:  No Specimen  DISPOSITION OF SPECIMEN:  N/A  COUNTS:  YES  TOURNIQUET:  * No tourniquets in log *  DICTATION: Patient is 83 year old man with  an L 5 compression fracture and  is proving debilitatingly painful to him.  It was elected to take him to surgery for kyphoplasty procedure.  PROCEDURE:  Following the smooth and uncomplicated induction of general endotracheal anesthesia, the patient was placed in a prone position on chest rolls.  C-arm fluoroscopy was positioned in both the AP and lateral planes, centered on the L 4 vertebra.  His back was prepped and draped in the usual sterile fashion with Duraprep.  Using a bi-pedicular approach, both L 4 pedicles and vertebral body were entered with the trochar using standard landmarks.  The drill was used, followed by a 15 cc Kyphon balloon, which was used to re-expand the broken vertebra.  Subsequently, 8 cc of bone cement was placed into the voids created by the balloons and was seen to fill the fracture cleft and fill the vertebra in both the AP and lateral direction with good interdigitation and with minimal apparent extravasation into the disc space. The bone void filler was then removed.  Final X-ray demonstrated good filling within the fractured vertebra.  The incisions were closed with two single 3-0 vicryl stitches and dressed with Dermabond. The patient was returned to the OR  gurney and extubated in the OR and taken to Recovery in stable and satisfactory condition, having tolerated the procedure well.  Counts were correct at the end of the case.   PLAN OF CARE: Admit to inpatient   PATIENT DISPOSITION:  PACU - hemodynamically stable.   Delay start of Pharmacological VTE agent (>24hrs) due to surgical blood loss or risk of bleeding: yes  

## 2017-05-28 NOTE — Discharge Summary (Signed)
Physician Discharge Summary  Patient ID: Daniel Cooke MRN: [id number] DOB/AGE: 81-Jan-1925 81 y.o.  Admit date: 05/28/2017 Discharge date: 05/28/2017  Admission Diagnoses:L 4 compression fracture, lumbago  Discharge Diagnoses: L 4 compression fracture, lumbago Active Problems:   Compression fracture of fourth lumbar vertebra [facility](HCC)   Discharged Condition: good  Hospital Course: Patient underwent L 4 kyphoplasty and did well.  He was mobilizing shortly after procedure with decreased pain.  Consults: None  Significant Diagnostic Studies: None  Treatments: surgery: L 4 kyphoplasty  Discharge Exam: Blood pressure (!) 164/73, pulse 66, temperature 97.5 F (36.4 C), resp. rate 18, weight 82.6 kg (182 lb), SpO2 100 %. Neurologic: Alert and oriented X 3, normal strength and tone. Normal symmetric reflexes. Normal coordination and gait Wound:CDI  Disposition: Home  Discharge Instructions    Diet - low sodium heart healthy    Complete by:  As directed    Increase activity slowly    Complete by:  As directed      Allergies as of 05/28/2017      Reactions   Codeine Nausea And Vomiting, Other (See Comments)   MAKES ME CRAZY   Oxycodone Nausea And Vomiting, Other (See Comments)   MAKES ME CRAZY      Medication List    STOP taking these medications   warfarin 5 MG tablet Commonly known as:  COUMADIN     TAKE these medications   acetaminophen 325 MG tablet Commonly known as:  TYLENOL Take 325 mg by mouth every 6 (six) hours as needed for mild pain.   furosemide 40 MG tablet Commonly known as:  LASIX Take 20 mg by mouth daily.   LANTUS SOLOSTAR 100 UNIT/ML Solostar Pen Generic drug:  Insulin Glargine Inject 20 Units into the skin at bedtime.   potassium chloride SA 20 MEQ tablet Commonly known as:  K-DUR,KLOR-CON Take 1 tablet (20 mEq total) by mouth daily.   simvastatin 20 MG tablet Commonly known as:  ZOCOR Take 20 mg by mouth at bedtime.   terazosin 5 MG  capsule Commonly known as:  HYTRIN Take 5 mg by mouth at bedtime.   traMADol 50 MG tablet Commonly known as:  ULTRAM Take 50 mg by mouth at bedtime as needed for pain. What changed:  Another medication with the same name was added. Make sure you understand how and when to take each.   traMADol 50 MG tablet Commonly known as:  ULTRAM Take 1 tablet (50 mg total) by mouth every 6 (six) hours as needed for moderate pain. What changed:  You were already taking a medication with the same name, and this prescription was added. Make sure you understand how and when to take each.        Signed: Dorian HeckleSTERN,Leilynn Pilat D, MD 05/28/2017, 4:32 PM

## 2017-05-28 NOTE — H&P (Signed)
Patient ID:   [id number] Patient: Bing Neighborsrnest Eagleton  Date of Birth: [birth year] Visit Type: Office Visit   Date: 05/14/2017 09:30 AM Provider: Danae OrleansJoseph D. Venetia MaxonStern MD   This [age] year old male presents for back pain.   History of Present Illness: 1.  back pain  MRI 05/05/2017 Acute benign-appearing compression fracture of the superior endplate of L4 asymmetric to the left. Old compression fracture of L1 with no neural impingement. Small broad-based soft disc protrusion at L3-4 with moderate spinal stenosis but without focal neural impingement.  Mr. Mary SellaCockman reports continued left leg and lumbar pain. His daughter reports that his pain increased after he attempted to carry a heavy bag of fertilizer. He remains on coumadin.           MEDICATIONS(added, continued or stopped this visit): Started Medication Directions Instruction Stopped   simvastatin 40 mg tablet take 1 tablet by oral route  every day in the evening     terazosin 2 mg capsule take 2 capsule by oral route  every day at bedtime    05/04/2017 Valium 5 mg tablet take 1 tablet by oral route 30 minutes prior to MRI     warfarin 2 mg tablet take 1 tablet by oral route  every day       ALLERGIES: Ingredient Reaction Medication Name Comment  CODEINE Unknown     Reviewed, no changes.    Vitals Date Temp F BP Pulse Ht In Wt Lb BMI BSA Pain Score  05/14/2017  146/68 63 68 187.2 28.46  8/10      IMPRESSION Lumbar MRI: L4 compression fracture worse on the left, soft tissue edema and arthritis   I would recommend a L4 kyphoplasty due to patient's continued left leg and lumbar pain and positive MRI scan for L4 compression fracture. Patient should stop taking coumadin 5 days prior to surgery.      Pain Management Plan Pain Scale: 8/10. Method: Numeric Pain Intensity Scale. Pain management follow-up plan of care: Patient is taking OTC pain relievers for relief..  scheduled L4 kyphoplasty. patient should stop taking  coumadin 5 days prior to surgery.              Provider:  Venetia MaxonStern MD, Danae OrleansJoseph D 05/14/2017 10:01 AM  Dictation edited by: Philis Kendallavid Daveah Varone    CC Providers: Alla Feelingonna  [facility] [address] [place],  [place]  [phone]-   Pati GalloJames Kramer  [address] [place], [place] [id number]-              Electronically signed by Danae OrleansJoseph D. Venetia MaxonStern MD on 05/14/2017 05:48 PM  Patient ID:   [id number] Patient: Bing NeighborsErnest Laskin  Date of Birth: [birth year] Visit Type: Office Visit   Date: 04/26/2017 11:00 AM Provider: Danae OrleansJoseph D. Venetia MaxonStern MD   This [age] year old male presents for back pain.   History of Present Illness: 1.  back pain  Patient returns to discuss options with Dr. Venetia MaxonStern.  He notes significant pain relief on the right side following injections in December in January, noting April injection did provide some pain relief.  He is frustrated with onset of left-sided lumbar and left thigh pain following his April injection. He notes his right side pain is 90% improved. left leg pain is 8-9/10.   PCP prescribed  Tramadol?  Taken 2 at bedtime and 1 during the day           MEDICATIONS(added, continued or stopped this visit):  Started Medication Directions Instruction Stopped   simvastatin 40 mg tablet take 1 tablet by oral route  every day in the evening     terazosin 2 mg capsule take 2 capsule by oral route  every day at bedtime     warfarin 2 mg tablet take 1 tablet by oral route  every day       ALLERGIES: Ingredient Reaction Medication Name Comment  CODEINE Unknown        Vitals Date Temp F BP Pulse Ht In Wt Lb BMI BSA Pain Score  04/26/2017  168/68 75 68 184.6 28.07  8/10      IMPRESSION physical examination: LE strength normal, reflexes symmetric, negative SLR bilaterally, positive for low back pain, positive Patrick's test on left  I would recommend a new lumbar MRI due to patient's miserable left lower extremity  pain.  ordered 4 view lumbar and AP pelvis left hip x-rays: no problems with hip, lumbar fracture well healed. No new fractures.  Completed Orders (this encounter) Order Details Reason Side Interpretation Result Initial Treatment Date Region  Lumbar Spine- AP/Lat/Flex/Ex      04/26/2017   Hip - Left Lateral W/AP Pelvis      04/26/2017    Assessment/Plan # Detail Type Description   1. Assessment Other dorsalgia (M54.89).       2. Assessment Radiculopathy, lumbar region (M54.16).           Pain Management Plan Pain Scale: 8/10. Method: Numeric Pain Intensity Scale. Pain management follow-up plan of care: Patient taking medication as prescribed..  Fall Risk Plan The patient has not fallen in the last year.  ordered new lumbar MRI. follow up after MRI.   Orders: Diagnostic Procedures: Assessment Procedure  M54.16 Lumbar Spine- AP/Lat/Flex/Ex  Z61.09 Hip - Left Lateral W/AP Pelvis             Provider:  Venetia Maxon MDDanae Orleans 04/26/2017 12:09 PM  Dictation edited by: Philis Kendall    CC Providers: [facility] [address] [place],  [place]  [address]-   Pati Gallo  [address] [place], [place] [address]-              Electronically signed by Danae Orleans. Venetia Maxon MD on 04/26/2017 07:23 PM  Patient ID:   [id number] Patient: Bing Neighbors  Date of Birth: 01/15/1924 Visit Type: Office Visit   Date: 10/26/2016 09:15 AM Provider: Danae Orleans. Venetia Maxon MD   This 83 year old male presents for back pain.  History of Present Illness: 1.  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  He recalls a fall last year requiring vertebroplasty in February.  He notes pain has persisted. He also has right leg pain   ESI June 2017 offered pain relief 2 months  Tylenol only as needed  Coumadin... Patient is not aware of diagnosis  History: HTN,  NIDDM Surgical history: Left TKR years ago, L1 vertebroplasty by Dr. Alfredo Batty February 2017, OS lens implant last week  X-rays on [facility] reveal healed kyphoplasty at L1 with no fractures at any other levels  June 2017 MRI reveals a bulging disc at L3-4 with foraminal narrowing on the right       PAST MEDICAL HISTORY, SURGICAL HISTORY, FAMILY HISTORY, SOCIAL HISTORY AND REVIEW OF SYSTEMS I have reviewed the patient's past medical, surgical, family and social history as well as the comprehensive review  of systems as included on the [facility] history form dated 10/26/2016, which I have signed.   MEDICATIONS(added, continued or stopped this visit):   ALLERGIES: Ingredient Reaction Medication Name Comment  CODEINE Unknown     Reviewed, updated.    Vitals Date Temp F BP Pulse Ht In Wt Lb BMI BSA Pain Score  10/26/2016  146/72 69 68 208 31.63  7/10     PHYSICAL EXAM General Level of Distress: no acute distress Overall Appearance: normal    Cardiovascular Cardiac: regular rate and rhythm without murmur  Respiratory Lungs: clear to auscultation  Neurological Recent and Remote Memory: normal Attention Span and Concentration:   normal Language: normal Fund of Knowledge: normal  Right Left Sensation: normal normal Upper Extremity Coordination: normal normal  Lower Extremity Coordination: normal normal  Musculoskeletal Gait and Station: normal  Right Left Upper Extremity Muscle Strength: normal normal Lower Extremity Muscle Strength: normal normal Upper Extremity Muscle Tone:  normal normal Lower Extremity Muscle Tone: normal normal  Motor Strength Upper and lower extremity motor strength was tested in the clinically pertinent muscles.     Deep Tendon Reflexes  Right Left Biceps: normal normal Triceps: normal normal Brachiloradialis: normal normal Patellar: normal normal Achilles: normal normal  Sensory Sensation was tested  at L1 to S1.   Cranial Nerves II. Optic Nerve/Visual Fields: normal III. Oculomotor: normal IV. Trochlear: normal V. Trigeminal: normal VI. Abducens: normal VII. Facial: normal VIII. Acoustic/Vestibular: normal IX. Glossopharyngeal: normal X. Vagus: normal XI. Spinal Accessory: normal XII. Hypoglossal: normal  Motor and other Tests Lhermittes: negative Rhomberg: negative    Right Left Hoffman's: normal normal Clonus: normal normal Babinski: normal normal SLR: negative negative Patrick's Pearlean Brownie): negative negative Toe Walk: normal normal Toe Lift: normal normal Heel Walk: normal normal SI Joint: nontender nontender   Additional Findings:  LE strength is normal, no sciatic notch discomfort, symmetric reflexes   DIAGNOSTIC RESULTS X-rays on [facility] reveal healed kyphoplasty at L1 with no fractures at any other levels  June 2017 MRI reveals a bulging disc at L3-4 with foraminal narrowing on the right    IMPRESSION The patient is experiencing low back and right leg pain. On review of his imaging, his L1 kyphoplasty has healed well with no fractures at other levels. He also has a bulging disc at L3-4 with foraminal narrowing on the right that is compressing the right L4 nerve root. On confrontational testing, his strength and reflexes are normal with a negative SLR bilaterally. I believe most of his problem is at that L3-4 level on the right as his pain does not go past his knee. I recommend a right L4 SNRB with Dr. Ollen Bowl for alleviation of his low back and right leg symptoms.   Completed Orders (this encounter) Order Details Reason Side Interpretation Result Initial Treatment Date Region  Lumbar Spine- AP/Lat/Flex/Ex      10/26/2016 All Levels to All Levels   Assessment/Plan # Detail Type Description   1. Assessment Disc displacement, lumbar (M51.26).       2. Assessment Radiculopathy, lumbar region (M54.16).   Plan Orders TFESI - right - L4 Harkins.       3.  Assessment Spondylosis of lumbar region without myelopathy or radiculopathy (M47.816).       4. Assessment Back pain without sciatica (M54.89).       5. Assessment Closed compression fracture of first lumbar vertebra, sequela (S32.010S).         Schedule right L4 SNRB with  Dr. Ollen Bowl. Follow up with me after.   Orders: Office Procedures/Services: Assessment Service Comments  M54.16 TFESI - right - L4 Harkins    Diagnostic Procedures: Assessment Procedure  S32.010S Lumbar Spine- AP/Lat/Flex/Ex             Provider:  Danae Orleans. Venetia Maxon MD  10/26/2016 09:54 AM Dictation edited by: Gabriel Rainwater    CC Providers: Pati Gallo [address] [place], [place] [address]-              Electronically signed by Danae Orleans. Venetia Maxon MD on 10/26/2016 10:36 AM

## 2017-05-28 NOTE — Anesthesia Preprocedure Evaluation (Signed)
Anesthesia Evaluation  Patient identified by MRN, date of birth, ID band Patient awake    Reviewed: Allergy & Precautions, NPO status , Patient's Chart, lab work & pertinent test results  History of Anesthesia Complications Negative for: history of anesthetic complications  Airway        Dental   Pulmonary neg pulmonary ROS,           Cardiovascular hypertension, Pt. on medications +CHF  + dysrhythmias Atrial Fibrillation      Neuro/Psych negative neurological ROS  negative psych ROS   GI/Hepatic   Endo/Other  diabetes  Renal/GU      Musculoskeletal  (+) Arthritis ,   Abdominal   Peds  Hematology  (+) anemia ,   Anesthesia Other Findings  Pt is a 83 year old male scheduled for L4 kyphoplasty on 05/28/2017 with Maeola HarmanJoseph Stern, M.D.  - PCP is Shaune Pollackonna Gates, MD who is aware of upcoming surgery - Cardiologist is Will Elberta Fortisamnitz, MD who cleared pt for surgery  PMH includes: atrial fibrillation (s/p cardioversion 02/04/17- unsuccessful), HTN, DM, hyperlipidemia, CHF.  Never smoker. BMI 26. S/p K knee arthroscopy 02/28/14.  S/p L TKA 02/08/12.    Medications include: Lasix, Lantus, terazosin, Coumadin. Patient stopped Coumadin 5 days before surgery.  Preoperative labs reviewed.   - Glucose 184. HbA1c was 9.0 on 04/02/17.   - Cr 1.73, BUN 43.  - PT will be done in anti-coag clinic the morning of surgery  CXR 11/25/16: Mild CHF with small effusions.  EKG 02/16/17: Atrial fibrillation with slow ventricular response (58 bpm) with premature aberrantly conducted, axis. RSR' or QR pattern in V1 suggests RV conduction delay.  Echo 11/26/16:  - Left ventricle: The cavity size was normal. Wall thickness wasincreased in a pattern of moderate LVH. Systolic function wasnormal. The estimated ejection fraction was in the range of 60%to 65%. Wall motion was normal; there were no regional wallmotion abnormalities. - Mitral valve:  There was moderate regurgitation. - Left atrium: The atrium was moderately dilated. - Right ventricle: The cavity size was mildly dilated. Wall thickness was normal. - Right atrium: The atrium was severely dilated. - Tricuspid valve: There was moderate regurgitation. - Pulmonary arteries: Systolic pressure was moderately increased.PA peak pressure: 65 mm Hg (S).  Nuclear stress test 01/05/11: Electrically positive for ischemia. Myoview with normal perfusion.  Reproductive/Obstetrics                             Anesthesia Physical Anesthesia Plan Anesthesia Quick Evaluation

## 2017-05-28 NOTE — Progress Notes (Signed)
Pt and daughter given D/C instructions with Rx, verbal understanding was provided. Pt's incision is clean and dry with no sign of infection. Pt's IV was removed prior to D/C. Pt D/C home via wheelchair @ 1730 per MD order. Pt is stable @ D/C and has no other needs at this time. Rema FendtAshley Glenda Spelman, RN

## 2017-05-28 NOTE — Anesthesia Procedure Notes (Signed)
Procedure Name: Intubation Date/Time: 05/28/2017 1:38 PM Performed by: Edmonia CaprioAUSTON, Shamon Lobo M Pre-anesthesia Checklist: Emergency Drugs available, Patient identified, Suction available, Patient being monitored and Timeout performed Patient Re-evaluated:Patient Re-evaluated prior to inductionOxygen Delivery Method: Circle system utilized Preoxygenation: Pre-oxygenation with 100% oxygen Intubation Type: IV induction Ventilation: Mask ventilation without difficulty Laryngoscope Size: Miller Grade View: Grade I Tube type: Oral Tube size: 7.0 mm Number of attempts: 1 Airway Equipment and Method: Stylet Placement Confirmation: ETT inserted through vocal cords under direct vision,  positive ETCO2 and breath sounds checked- equal and bilateral Secured at: 22 cm Tube secured with: Tape Dental Injury: Teeth and Oropharynx as per pre-operative assessment

## 2017-05-28 NOTE — Brief Op Note (Signed)
05/28/2017  2:40 PM  PATIENT:  Daniel Cooke  83 y.o. male  PRE-OPERATIVE DIAGNOSIS:  Compression fracture L 4, lumbago  POST-OPERATIVE DIAGNOSIS:  Compression fracture L4, lumbago  PROCEDURE:  Procedure(s) with comments: Lumbar Four Kyphoplasty (N/A) - L4 Kyphoplasty  SURGEON:  Surgeon(s) and Role:    Maeola Harman* Hayes Czaja, MD - Primary  PHYSICIAN ASSISTANT:   ASSISTANTS: None   ANESTHESIA:   general  EBL:  Total I/O In: 500 [I.V.:500] Out: -   BLOOD ADMINISTERED:none  DRAINS: none   LOCAL MEDICATIONS USED:  MARCAINE    and LIDOCAINE   SPECIMEN:  No Specimen  DISPOSITION OF SPECIMEN:  N/A  COUNTS:  YES  TOURNIQUET:  * No tourniquets in log *  DICTATION: Patient is 83 year old man with  an L 5 compression fracture and  is proving debilitatingly painful to him.  It was elected to take him to surgery for kyphoplasty procedure.  PROCEDURE:  Following the smooth and uncomplicated induction of general endotracheal anesthesia, the patient was placed in a prone position on chest rolls.  C-arm fluoroscopy was positioned in both the AP and lateral planes, centered on the L 4 vertebra.  His back was prepped and draped in the usual sterile fashion with Duraprep.  Using a bi-pedicular approach, both L 4 pedicles and vertebral body were entered with the trochar using standard landmarks.  The drill was used, followed by a 15 cc Kyphon balloon, which was used to re-expand the broken vertebra.  Subsequently, 8 cc of bone cement was placed into the voids created by the balloons and was seen to fill the fracture cleft and fill the vertebra in both the AP and lateral direction with good interdigitation and with minimal apparent extravasation into the disc space. The bone void filler was then removed.  Final X-ray demonstrated good filling within the fractured vertebra.  The incisions were closed with two single 3-0 vicryl stitches and dressed with Dermabond. The patient was returned to the OR  gurney and extubated in the OR and taken to Recovery in stable and satisfactory condition, having tolerated the procedure well.  Counts were correct at the end of the case.   PLAN OF CARE: Admit to inpatient   PATIENT DISPOSITION:  PACU - hemodynamically stable.   Delay start of Pharmacological VTE agent (>24hrs) due to surgical blood loss or risk of bleeding: yes

## 2017-05-28 NOTE — Anesthesia Preprocedure Evaluation (Addendum)
Anesthesia Evaluation  Patient identified by MRN, date of birth, ID band Patient awake    Reviewed: Allergy & Precautions, NPO status , Patient's Chart, lab work & pertinent test results  History of Anesthesia Complications Negative for: history of anesthetic complications  Airway Mallampati: III  TM Distance: >3 FB Neck ROM: Full    Dental  (+) Chipped, Teeth Intact,    Pulmonary neg pulmonary ROS,    breath sounds clear to auscultation       Cardiovascular hypertension, Pt. on medications +CHF  + dysrhythmias Atrial Fibrillation  Rhythm:Irregular     Neuro/Psych negative neurological ROS  negative psych ROS   GI/Hepatic negative GI ROS, Neg liver ROS,   Endo/Other  diabetes, Type 2, Insulin Dependent  Renal/GU Renal InsufficiencyRenal disease     Musculoskeletal  (+) Arthritis ,   Abdominal   Peds  Hematology  (+) anemia ,   Anesthesia Other Findings   Reproductive/Obstetrics                            Anesthesia Physical Anesthesia Plan  ASA: III  Anesthesia Plan: General   Post-op Pain Management:    Induction: Intravenous  PONV Risk Score and Plan: 2 and Ondansetron and Treatment may vary due to age or medical condition  Airway Management Planned: Oral ETT  Additional Equipment: None  Intra-op Plan:   Post-operative Plan: Extubation in OR  Informed Consent: I have reviewed the patients History and Physical, chart, labs and discussed the procedure including the risks, benefits and alternatives for the proposed anesthesia with the patient or authorized representative who has indicated his/her understanding and acceptance.   Dental advisory given  Plan Discussed with: CRNA and Surgeon  Anesthesia Plan Comments:         Anesthesia Quick Evaluation

## 2017-05-28 NOTE — Transfer of Care (Signed)
Immediate Anesthesia Transfer of Care Note  Patient: Daniel Cooke  Procedure(s) Performed: Procedure(s) with comments: Lumbar Four Kyphoplasty (N/A) - L4 Kyphoplasty  Patient Location: PACU  Anesthesia Type:General  Level of Consciousness: awake and alert   Airway & Oxygen Therapy: Patient Spontanous Breathing and Patient connected to nasal cannula oxygen  Post-op Assessment: Report given to RN, Post -op Vital signs reviewed and stable and Patient moving all extremities X 4  Post vital signs: Reviewed and stable  Last Vitals:  Vitals:   05/28/17 1116 05/28/17 1122  BP: (!) 169/48 (!) 169/48  Pulse:  98  Resp:  18  Temp:  36.6 C    Last Pain:  Vitals:   05/28/17 1122  TempSrc: Oral  PainSc:          Complications: No apparent anesthesia complications

## 2017-05-28 NOTE — Interval H&P Note (Signed)
History and Physical Interval Note:  05/28/2017 1:00 PM  Daniel Cooke  has presented today for surgery, with the diagnosis of Compression fracture  The various methods of treatment have been discussed with the patient and family. After consideration of risks, benefits and other options for treatment, the patient has consented to  Procedure(s) with comments: L4 Kyphoplasty (N/A) - L4 Kyphoplasty as a surgical intervention .  The patient's history has been reviewed, patient examined, no change in status, stable for surgery.  I have reviewed the patient's chart and labs.  Questions were answered to the patient's satisfaction.     Danna Casella D

## 2017-05-29 NOTE — Anesthesia Postprocedure Evaluation (Signed)
Anesthesia Post Note  Patient: Daniel Cooke  Procedure(s) Performed: Procedure(s) (LRB): Lumbar Four Kyphoplasty (N/A)     Patient location during evaluation: PACU Anesthesia Type: General Level of consciousness: awake Pain management: pain level controlled Vital Signs Assessment: post-procedure vital signs reviewed and stable Respiratory status: spontaneous breathing, nonlabored ventilation, respiratory function stable and patient connected to nasal cannula oxygen Cardiovascular status: blood pressure returned to baseline and stable Postop Assessment: no signs of nausea or vomiting Anesthetic complications: no    Last Vitals:  Vitals:   05/28/17 1635 05/28/17 1702  BP: 124/70 130/62  Pulse: 74 63  Resp: 18 18  Temp: 36.8 C 36.5 C    Last Pain:  Vitals:   05/28/17 1122  TempSrc: Oral  PainSc:                  Kaitlin Ardito

## 2017-05-31 ENCOUNTER — Encounter (HOSPITAL_COMMUNITY): Payer: Self-pay | Admitting: Neurosurgery

## 2017-06-07 ENCOUNTER — Ambulatory Visit (INDEPENDENT_AMBULATORY_CARE_PROVIDER_SITE_OTHER): Payer: Medicare Other | Admitting: *Deleted

## 2017-06-07 DIAGNOSIS — Z5181 Encounter for therapeutic drug level monitoring: Secondary | ICD-10-CM

## 2017-06-07 DIAGNOSIS — I482 Chronic atrial fibrillation, unspecified: Secondary | ICD-10-CM

## 2017-06-07 LAB — POCT INR: INR: 1.9

## 2017-06-14 NOTE — Progress Notes (Deleted)
Electrophysiology Office Note   Date:  06/14/2017   ID:  Daniel Cooke, DOB 07-04-1924, MRN [id number]  PCP:  Shaune Pollack, MD  Electrophysiologist:  Regan Lemming, MD    No chief complaint on file.    History of Present Illness: Daniel Cooke is a 83 y.o. male who presents today for electrophysiology evaluation.   He has a history of hypertension, diabetes, and hyperlipidemia. He was found to be in atrial fibrillation after a fall where he suffered a vertebral fracture.  He was found to have a pinched nerve around that vertebra and is planned to have injections performed. He was admitted for a HF exacerbation with worsening DOE and edema. His weight had increased by 20 lbs. BNP was elevated at 1068. It was thought that his AF was contributing to his HF episode. He was not cardioverted as he was planned to have a second steroid injection in his back in January.***  Today, he denies symptoms of palpitations, chest pain, shortness of breath, orthopnea, PND, lower extremity edema, claudication, presyncope, syncope, or neurologic sequela. The patient is tolerating medications without difficulties. 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  Past Medical History:  Diagnosis Date  . Arthritis    "was in left knee before replacement; now have it in my right knee" (11/25/2016)  . BPH (benign prostatic hypertrophy)   . Chronic diastolic CHF (congestive heart failure) (HCC)    a. 02/2016 Echo: EF 60-65%, no rwma, triv AI, mild MR, mildly to mod dil LA, mod dil RA, PASP .  Marland Kitchen Dysrhythmia   . Essential hypertension   . Hard of hearing    wears bilateral hearing aids  . Hypercholesterolemia   . Osteoarthritis   . PAF (paroxysmal atrial fibrillation) (HCC)    a. CHADS2VASC score of 4 --> coumadin.  . Pilonidal cyst    PAST HX - NO PROBLEM NOW  . Pneumonia    "one  time; years ago" (11/25/2016)  . Shingles    "long time ago"  . Synovitis of knee 02/27/2014  . Type II diabetes mellitus (HCC)    oral meds - no insulin   Past Surgical History:  Procedure Laterality Date  . APPENDECTOMY    . CARDIOVERSION N/A 02/04/2017   Procedure: CARDIOVERSION;  Surgeon: [name], MD;  Location: [facility];  Service: Cardiovascular;  Laterality: N/A;  . CATARACT EXTRACTION W/ INTRAOCULAR LENS IMPLANT Left 10/2016  . CHOLECYSTECTOMY OPEN    . COLONOSCOPY W/ BIOPSIES  08/2005   [name] 04/27/2011  . EYE SURGERY Right    "right" traumatic cataract removed--injury to the eye--states his eyesight is ok in left eye  . JOINT REPLACEMENT    . KNEE ARTHROSCOPY Left 02/28/2014   Procedure: ARTHROSCOPY LEFT KNEE WITH SYNOVECTOMY;  Surgeon: [name], MD;  Location: [facility];  Service: Orthopedics;  Laterality: Left;  . KYPHOPLASTY  2017  . KYPHOPLASTY N/A 05/28/2017   Procedure: Lumbar Four Kyphoplasty;  Surgeon: Maeola Harman, MD;  Location[facility] OR;  Service: Neurosurgery;  Laterality: N/A;  L4 Kyphoplasty  . SHOULDER OPEN ROTATOR CUFF REPAIR Left   . TONSILLECTOMY    . TOTAL KNEE ARTHROPLASTY  02/08/2012   Procedure: TOTAL KNEE ARTHROPLASTY;  Surgeon: [name], MD;  Location: [facility];  Service: Orthopedics;  Laterality: Left;  Marland Kitchen VASECTOMY  Current Outpatient Prescriptions  Medication Sig Dispense Refill  . acetaminophen (TYLENOL) 325 MG tablet Take 325 mg by mouth every 6 (six) hours as needed for mild pain.    . furosemide (LASIX) 40 MG tablet Take 20 mg by mouth daily.    . Insulin Glargine (LANTUS SOLOSTAR) 100 UNIT/ML Solostar Pen Inject 20 Units into the skin at bedtime.     . potassium chloride SA (K-DUR,KLOR-CON) 20 MEQ tablet Take 1 tablet (20 mEq total) by mouth daily. (Patient not taking: Reported on 05/20/2017) 30 tablet 2  . simvastatin (ZOCOR) 20 MG tablet Take 20 mg by mouth at bedtime.    Marland Kitchen. terazosin (HYTRIN) 5 MG capsule Take 5 mg by  mouth at bedtime.    . traMADol (ULTRAM) 50 MG tablet Take 50 mg by mouth at bedtime as needed for pain.  0  . traMADol (ULTRAM) 50 MG tablet Take 1 tablet (50 mg total) by mouth every 6 (six) hours as needed for moderate pain. 60 tablet 0   No current facility-administered medications for this visit.     Allergies:   Codeine and Oxycodone   Social History:  The patient  reports that he has never smoked. He has never used smokeless tobacco. He reports that he does not drink alcohol or use drugs.   Family History:  The patient's family history includes Heart attack in his brother; Heart disease in his brother and mother.    ROS:  Please see the history of present illness.   Otherwise, review of systems is positive for ***.   All other systems are reviewed and negative.     PHYSICAL EXAM: VS:  There were no vitals taken for this visit. , BMI There is no height or weight on file to calculate BMI. GEN: Well nourished, well developed, in no acute distress  HEENT: normal  Neck: no JVD, carotid bruits, or masses Cardiac: ***RRR; no murmurs, rubs, or gallops,no edema  Respiratory:  clear to auscultation bilaterally, normal work of breathing GI: soft, nontender, nondistended, + BS MS: no deformity or atrophy  Skin: warm and dry Neuro:  Strength and sensation are intact Psych: euthymic mood, full affect  EKG:  EKG {ACTION; IS/IS [id number]} ordered today. Personal review of the ekg ordered *** shows ***   Recent Labs: 11/25/2016: B Natriuretic Peptide 1,068.2 05/24/2017: BUN 43; Creatinine, Ser 1.73; Hemoglobin 11.2; Platelets 193; Potassium 4.4; Sodium 138    Lipid Panel  No results found for: CHOL, TRIG, HDL, CHOLHDL, VLDL, LDLCALC, LDLDIRECT   Wt Readings from Last 3 Encounters:  05/28/17 182 lb (82.6 kg)  05/24/17 182 lb 3.2 oz (82.6 kg)  05/11/17 178 lb (80.7 kg)      Other studies Reviewed: Additional studies/ records that were reviewed today include: 12/2010 MPI    Review of the above records today demonstrates:  ECG positive for ischemia, myoview with normal perfusion  11/26/16 TTE - Left ventricle: The cavity size was normal. Wall thickness was   increased in a pattern of moderate LVH. Systolic function was   normal. The estimated ejection fraction was in the range of 60%   to 65%. Wall motion was normal; there were no regional wall   motion abnormalities. - Mitral valve: There was moderate regurgitation. - Left atrium: The atrium was moderately dilated. - Right ventricle: The cavity size was mildly dilated. Wall   thickness was normal. - Right atrium: The atrium was severely dilated. - Tricuspid valve: There was moderate regurgitation. - Pulmonary arteries:  Systolic pressure was moderately increased.   PA peak pressure: 65 mm Hg (S).  ASSESSMENT AND PLAN:  1.  Permanent atrial fibrillation: was noted to incidentally have atrial fibrillation. He is minimally some symptomatic with shortness of breath and fatigue. He has a CHADS2VASC score of 4 and therefore does require anticoagulation.  On warfarin for anticoagulation.  An attempt at cardioversion but did not convert him back to normal rhythm. He is minimally symptomatic at this time. He would prefer not to have further attempts at sinus rhythm. We'll continue current management.***  This patients CHA2DS2-VASc Score and unadjusted Ischemic Stroke Rate (% per year) is equal to 4.8 % stroke rate/year from a score of 4  Above score calculated as 1 point each if present [CHF, HTN, DM, Vascular=MI/PAD/Aortic Plaque, Age if 65-74, or Male] Above score calculated as 2 points each if present [Age > 75, or Stroke/TIA/TE]   2. Diastolic heart failure: Currently well compensated today. We'll continue his Lasix. He Regnald Bowens likely need Lasix up until he gets cardioverted.***  3. Hyperlipidemia: Continue statin***   Current medicines are reviewed at length with the patient today.   The patient does not  have concerns regarding his medicines.  The following changes were made today:  ***  Labs/ tests ordered today include:  No orders of the defined types were placed in this encounter.    Disposition:   FU with Caleah Tortorelli *** months  Signed, Kimaria Struthers Jorja Loa, MD  06/14/2017 12:53 PM     [facility] [address] [place] [address] [phone] (office) [phone] (fax)

## 2017-06-15 ENCOUNTER — Ambulatory Visit: Payer: Medicare Other | Admitting: Cardiology

## 2017-06-21 ENCOUNTER — Ambulatory Visit (INDEPENDENT_AMBULATORY_CARE_PROVIDER_SITE_OTHER): Payer: Medicare Other | Admitting: *Deleted

## 2017-06-21 DIAGNOSIS — Z5181 Encounter for therapeutic drug level monitoring: Secondary | ICD-10-CM | POA: Diagnosis not present

## 2017-06-21 DIAGNOSIS — I482 Chronic atrial fibrillation, unspecified: Secondary | ICD-10-CM

## 2017-06-21 LAB — POCT INR: INR: 2

## 2017-06-28 ENCOUNTER — Encounter: Payer: Self-pay | Admitting: Cardiology

## 2017-06-28 ENCOUNTER — Encounter (INDEPENDENT_AMBULATORY_CARE_PROVIDER_SITE_OTHER): Payer: Self-pay

## 2017-06-28 ENCOUNTER — Ambulatory Visit (INDEPENDENT_AMBULATORY_CARE_PROVIDER_SITE_OTHER): Payer: Medicare Other | Admitting: Cardiology

## 2017-06-28 VITALS — BP 110/60 | HR 52 | Ht 70.0 in | Wt 186.2 lb

## 2017-06-28 DIAGNOSIS — E782 Mixed hyperlipidemia: Secondary | ICD-10-CM | POA: Diagnosis not present

## 2017-06-28 DIAGNOSIS — M5416 Radiculopathy, lumbar region: Secondary | ICD-10-CM | POA: Diagnosis not present

## 2017-06-28 DIAGNOSIS — I480 Paroxysmal atrial fibrillation: Secondary | ICD-10-CM | POA: Diagnosis not present

## 2017-06-28 DIAGNOSIS — M545 Low back pain: Secondary | ICD-10-CM | POA: Diagnosis not present

## 2017-06-28 DIAGNOSIS — I5032 Chronic diastolic (congestive) heart failure: Secondary | ICD-10-CM | POA: Diagnosis not present

## 2017-06-28 DIAGNOSIS — S32040G Wedge compression fracture of fourth lumbar vertebra, subsequent encounter for fracture with delayed healing: Secondary | ICD-10-CM | POA: Diagnosis not present

## 2017-06-28 MED ORDER — FUROSEMIDE 40 MG PO TABS: 20.0000 mg | ORAL_TABLET | Freq: Every day | ORAL | 6 refills | Status: DC

## 2017-06-28 NOTE — Patient Instructions (Signed)
Medication Instructions:  Your physician recommends that you continue on your current medications as directed. Please refer to the Current Medication list given to you today.  If you need a refill on your cardiac medications before your next appointment, please call your pharmacy.   Labwork: None ordered  Testing/Procedures: None ordered  Follow-Up: Your physician wants you to follow-up in: 6 months with Dr. Camnitz.  You will receive a reminder letter in the mail two months in advance. If you don't receive a letter, please call our office to schedule the follow-up appointment.  Thank you for choosing [facility]!!   Shelvia Fojtik, RN [phone]         

## 2017-06-28 NOTE — Progress Notes (Signed)
Electrophysiology Office Note   Date:  06/28/2017   ID:  Daniel Sabinarnest P Bialas, DOB 02-12-1924, MRN [id number]  PCP:  Shaune PollackGates, Donna, MD  Electrophysiologist:  Regan LemmingWill Martin Camnitz, MD    Chief Complaint  Patient presents with  . Follow-up    PAF     History of Present Illness: Daniel Cooke is a 83 y.o. male who presents today for electrophysiology evaluation.   He has a history of hypertension, diabetes, and hyperlipidemia. He was found to be in atrial fibrillation after a fall where he suffered a vertebral fracture.  He was found to have a pinched nerve around that vertebra and is planned to have injections performed. He was admitted for a HF exacerbation with worsening DOE and edema. His weight had increased by 20 lbs. BNP was elevated at 1068. It was thought that his AF was contributing to his HF episode. He was not cardioverted as he was planned to have a second steroid injection in his back in January.  Today, denies symptoms of palpitations, chest pain, shortness of breath, orthopnea, PND, lower extremity edema, claudication, dizziness, presyncope, syncope, bleeding, or neurologic sequela. The patient is tolerating medications without difficulties and is otherwise without complaint today. He had a cement injection to fix cracks in his vertebrae and this has helped greatly with his back pain. He is able to ambulate with much more efficiency and is no longer in the back pain.  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  Past Medical History:  Diagnosis Date  . Arthritis    "was in left knee before replacement; now have it in my right knee" (11/25/2016)  . BPH (benign prostatic hypertrophy)   . Chronic diastolic CHF (congestive heart failure) (HCC)    a. 02/2016 Echo: EF 60-65%, no rwma, triv AI, mild MR, mildly to mod dil LA, mod dil RA, PASP 71mmHg.  Marland Kitchen. Dysrhythmia   . Essential hypertension   . Hard of hearing    wears bilateral  hearing aids  . Hypercholesterolemia   . Osteoarthritis   . PAF (paroxysmal atrial fibrillation) (HCC)    a. CHADS2VASC score of 4 --> coumadin.  . Pilonidal cyst    PAST HX - NO PROBLEM NOW  . Pneumonia    "one time; years ago" (11/25/2016)  . Shingles    "long time ago"  . Synovitis of knee 02/27/2014  . Type II diabetes mellitus (HCC)    oral meds - no insulin   Past Surgical History:  Procedure Laterality Date  . APPENDECTOMY    . CARDIOVERSION N/A 02/04/2017   Procedure: CARDIOVERSION;  Surgeon: Lewayne BuntingBrian S Crenshaw, MD;  [facility];  Service: Cardiovascular;  Laterality: N/A;  . CATARACT EXTRACTION W/ INTRAOCULAR LENS IMPLANT Left 10/2016  . CHOLECYSTECTOMY OPEN    . COLONOSCOPY W/ BIOPSIES  08/2005   [name]/notes 04/27/2011  . EYE SURGERY Right    "right" traumatic cataract removed--injury to the eye--states his eyesight is ok in left eye  . JOINT REPLACEMENT    . KNEE ARTHROSCOPY Left 02/28/2014   Procedure: ARTHROSCOPY LEFT KNEE WITH SYNOVECTOMY;  Surgeon: Loanne DrillingFrank V Aluisio, MD;  Location: [facility];  Service: Orthopedics;  Laterality: Left;  . KYPHOPLASTY  2017  . KYPHOPLASTY N/A 05/28/2017   Procedure: Lumbar Four Kyphoplasty;  Surgeon: Maeola HarmanStern, Joseph, MD;  [facility];  Service: Neurosurgery;  Laterality: N/A;  L4 Kyphoplasty  . SHOULDER OPEN ROTATOR  CUFF REPAIR Left   . TONSILLECTOMY    . TOTAL KNEE ARTHROPLASTY  02/08/2012   Procedure: TOTAL KNEE ARTHROPLASTY;  Surgeon: [name], MD;  Location: [facility];  Service: Orthopedics;  Laterality: Left;  Marland Kitchen VASECTOMY       Current Outpatient Prescriptions  Medication Sig Dispense Refill  . acetaminophen (TYLENOL) 325 MG tablet Take 325 mg by mouth every 6 (six) hours as needed for mild pain.    . furosemide (LASIX) 40 MG tablet Take 20 mg by mouth daily.    . Insulin Glargine (LANTUS SOLOSTAR) 100 UNIT/ML Solostar Pen Inject 20 Units into the skin at bedtime.     . potassium chloride SA (K-DUR,KLOR-CON) 20 MEQ tablet  Take 1 tablet (20 mEq total) by mouth daily. 30 tablet 2  . simvastatin (ZOCOR) 20 MG tablet Take 20 mg by mouth at bedtime.    Marland Kitchen terazosin (HYTRIN) 5 MG capsule Take 5 mg by mouth at bedtime.    . traMADol (ULTRAM) 50 MG tablet Take 50 mg by mouth at bedtime as needed for pain.  0  . traMADol (ULTRAM) 50 MG tablet Take 1 tablet (50 mg total) by mouth every 6 (six) hours as needed for moderate pain. 60 tablet 0   No current facility-administered medications for this visit.     Allergies:   Codeine and Oxycodone   Social History:  The patient  reports that he has never smoked. He has never used smokeless tobacco. He reports that he does not drink alcohol or use drugs.   Family History:  The patient's family history includes Heart attack in his brother; Heart disease in his brother and mother.    ROS:  Please see the history of present illness.   Otherwise, review of systems is positive for Hearing loss, easy bruising.   All other systems are reviewed and negative.   PHYSICAL EXAM: VS:  BP 110/60   Pulse (!) 52   Ht 5\' 10"  (1.778 m)   Wt 186 lb 3.2 oz (84.5 kg)   BMI 26.72 kg/m  , BMI Body mass index is 26.72 kg/m. GEN: Well nourished, well developed, in no acute distress  HEENT: normal  Neck: no JVD, carotid bruits, or masses Cardiac: RRR; no murmurs, rubs, or gallops,no edema  Respiratory:  clear to auscultation bilaterally, normal work of breathing GI: soft, nontender, nondistended, + BS MS: no deformity or atrophy  Skin: warm and dry Neuro:  Strength and sensation are intact Psych: euthymic mood, full affect  EKG:  EKG is not ordered today. Personal review of the ekg ordered 02/2017 shows atrial fibrillation, rate 58   Recent Labs: 11/25/2016: B Natriuretic Peptide 1,068.2 05/24/2017: BUN 43; Creatinine, Ser 1.73; Hemoglobin 11.2; Platelets 193; Potassium 4.4; Sodium 138    Lipid Panel  No results found for: CHOL, TRIG, HDL, CHOLHDL, VLDL, LDLCALC, LDLDIRECT   Wt  Readings from Last 3 Encounters:  06/28/17 186 lb 3.2 oz (84.5 kg)  05/28/17 182 lb (82.6 kg)  05/24/17 182 lb 3.2 oz (82.6 kg)      Other studies Reviewed: Additional studies/ records that were reviewed today include: 12/2010 MPI  Review of the above records today demonstrates:  ECG positive for ischemia, myoview with normal perfusion  11/26/16 TTE - Left ventricle: The cavity size was normal. Wall thickness was   increased in a pattern of moderate LVH. Systolic function was   normal. The estimated ejection fraction was in the range of 60%   to 65%.  Wall motion was normal; there were no regional wall   motion abnormalities. - Mitral valve: There was moderate regurgitation. - Left atrium: The atrium was moderately dilated. - Right ventricle: The cavity size was mildly dilated. Wall   thickness was normal. - Right atrium: The atrium was severely dilated. - Tricuspid valve: There was moderate regurgitation. - Pulmonary arteries: Systolic pressure was moderately increased.   PA peak pressure: 65 mm Hg (S).  ASSESSMENT AND PLAN:  1.  Permanent Atrial fibrillation: Has had cardioversions in the past, but quickly reverted back to atrial fibrillation. He is feeling well in atrial fibrillation with a rate of 52. Will not make any further changes to his medical management.  This patients CHA2DS2-VASc Score and unadjusted Ischemic Stroke Rate (% per year) is equal to 4.8 % stroke rate/year from a score of 4  Above score calculated as 1 point each if present [CHF, HTN, DM, Vascular=MI/PAD/Aortic Plaque, Age if 65-74, or Male] Above score calculated as 2 points each if present [Age > 75, or Stroke/TIA/TE]   2. Diastolic heart failure: Currently well compensated today. Tolerating his Lasix dose and has not had any further episodes of heart failure.  3. Hyperlipidemia: Continue statin   Current medicines are reviewed at length with the patient today.   The patient does not have concerns  regarding his medicines.  The following changes were made today:  None  Labs/ tests ordered today include:  No orders of the defined types were placed in this encounter.    Disposition:   FU with Will Camnitz 6  months  Signed, Will Jorja Loa, MD  06/28/2017 11:35 AM     [facility] [address] [place] [address] [phone] (office) [phone] (fax)

## 2017-07-06 ENCOUNTER — Ambulatory Visit (INDEPENDENT_AMBULATORY_CARE_PROVIDER_SITE_OTHER): Payer: Medicare Other | Admitting: *Deleted

## 2017-07-06 DIAGNOSIS — I482 Chronic atrial fibrillation, unspecified: Secondary | ICD-10-CM

## 2017-07-06 DIAGNOSIS — Z5181 Encounter for therapeutic drug level monitoring: Secondary | ICD-10-CM

## 2017-07-06 DIAGNOSIS — M1711 Unilateral primary osteoarthritis, right knee: Secondary | ICD-10-CM | POA: Diagnosis not present

## 2017-07-06 LAB — POCT INR: INR: 1.8

## 2017-07-14 DIAGNOSIS — E119 Type 2 diabetes mellitus without complications: Secondary | ICD-10-CM | POA: Diagnosis not present

## 2017-07-20 ENCOUNTER — Ambulatory Visit (INDEPENDENT_AMBULATORY_CARE_PROVIDER_SITE_OTHER): Payer: Medicare Other

## 2017-07-20 DIAGNOSIS — I482 Chronic atrial fibrillation, unspecified: Secondary | ICD-10-CM

## 2017-07-20 DIAGNOSIS — Z5181 Encounter for therapeutic drug level monitoring: Secondary | ICD-10-CM

## 2017-07-20 LAB — POCT INR: INR: 2.8

## 2017-08-04 DIAGNOSIS — I5022 Chronic systolic (congestive) heart failure: Secondary | ICD-10-CM | POA: Diagnosis not present

## 2017-08-04 DIAGNOSIS — E7800 Pure hypercholesterolemia, unspecified: Secondary | ICD-10-CM | POA: Diagnosis not present

## 2017-08-04 DIAGNOSIS — I4891 Unspecified atrial fibrillation: Secondary | ICD-10-CM | POA: Diagnosis not present

## 2017-08-04 DIAGNOSIS — Z0000 Encounter for general adult medical examination without abnormal findings: Secondary | ICD-10-CM | POA: Diagnosis not present

## 2017-08-17 ENCOUNTER — Ambulatory Visit (INDEPENDENT_AMBULATORY_CARE_PROVIDER_SITE_OTHER): Payer: Medicare Other | Admitting: *Deleted

## 2017-08-17 DIAGNOSIS — Z5181 Encounter for therapeutic drug level monitoring: Secondary | ICD-10-CM

## 2017-08-17 DIAGNOSIS — I482 Chronic atrial fibrillation, unspecified: Secondary | ICD-10-CM

## 2017-08-17 LAB — POCT INR: INR: 1.5

## 2017-08-24 ENCOUNTER — Ambulatory Visit (INDEPENDENT_AMBULATORY_CARE_PROVIDER_SITE_OTHER): Payer: Medicare Other

## 2017-08-24 DIAGNOSIS — I482 Chronic atrial fibrillation, unspecified: Secondary | ICD-10-CM

## 2017-08-24 DIAGNOSIS — Z5181 Encounter for therapeutic drug level monitoring: Secondary | ICD-10-CM

## 2017-08-24 LAB — POCT INR: INR: 2.3

## 2017-09-07 ENCOUNTER — Ambulatory Visit (INDEPENDENT_AMBULATORY_CARE_PROVIDER_SITE_OTHER): Payer: Medicare Other | Admitting: *Deleted

## 2017-09-07 DIAGNOSIS — Z5181 Encounter for therapeutic drug level monitoring: Secondary | ICD-10-CM

## 2017-09-07 DIAGNOSIS — I482 Chronic atrial fibrillation, unspecified: Secondary | ICD-10-CM

## 2017-09-07 LAB — POCT INR: INR: 1.5

## 2017-09-15 DIAGNOSIS — M8588 Other specified disorders of bone density and structure, other site: Secondary | ICD-10-CM | POA: Diagnosis not present

## 2017-09-15 DIAGNOSIS — M810 Age-related osteoporosis without current pathological fracture: Secondary | ICD-10-CM | POA: Diagnosis not present

## 2017-09-16 ENCOUNTER — Ambulatory Visit (INDEPENDENT_AMBULATORY_CARE_PROVIDER_SITE_OTHER): Payer: Medicare Other | Admitting: *Deleted

## 2017-09-16 DIAGNOSIS — Z5181 Encounter for therapeutic drug level monitoring: Secondary | ICD-10-CM | POA: Diagnosis not present

## 2017-09-16 DIAGNOSIS — I482 Chronic atrial fibrillation, unspecified: Secondary | ICD-10-CM

## 2017-09-16 LAB — POCT INR: INR: 2.8

## 2017-09-24 ENCOUNTER — Ambulatory Visit (INDEPENDENT_AMBULATORY_CARE_PROVIDER_SITE_OTHER): Payer: Medicare Other | Admitting: Ophthalmology

## 2017-09-24 DIAGNOSIS — I10 Essential (primary) hypertension: Secondary | ICD-10-CM | POA: Diagnosis not present

## 2017-09-24 DIAGNOSIS — E113293 Type 2 diabetes mellitus with mild nonproliferative diabetic retinopathy without macular edema, bilateral: Secondary | ICD-10-CM

## 2017-09-24 DIAGNOSIS — H338 Other retinal detachments: Secondary | ICD-10-CM | POA: Diagnosis not present

## 2017-09-24 DIAGNOSIS — H4423 Degenerative myopia, bilateral: Secondary | ICD-10-CM | POA: Diagnosis not present

## 2017-09-24 DIAGNOSIS — E11319 Type 2 diabetes mellitus with unspecified diabetic retinopathy without macular edema: Secondary | ICD-10-CM | POA: Diagnosis not present

## 2017-09-24 DIAGNOSIS — H43813 Vitreous degeneration, bilateral: Secondary | ICD-10-CM

## 2017-09-24 DIAGNOSIS — H35033 Hypertensive retinopathy, bilateral: Secondary | ICD-10-CM

## 2017-09-30 ENCOUNTER — Ambulatory Visit (INDEPENDENT_AMBULATORY_CARE_PROVIDER_SITE_OTHER): Payer: Medicare Other | Admitting: *Deleted

## 2017-09-30 DIAGNOSIS — I482 Chronic atrial fibrillation, unspecified: Secondary | ICD-10-CM

## 2017-09-30 DIAGNOSIS — Z5181 Encounter for therapeutic drug level monitoring: Secondary | ICD-10-CM

## 2017-09-30 LAB — POCT INR: INR: 2.8

## 2017-10-13 DIAGNOSIS — M5489 Other dorsalgia: Secondary | ICD-10-CM | POA: Diagnosis not present

## 2017-10-13 DIAGNOSIS — M545 Low back pain: Secondary | ICD-10-CM | POA: Diagnosis not present

## 2017-10-13 DIAGNOSIS — M47816 Spondylosis without myelopathy or radiculopathy, lumbar region: Secondary | ICD-10-CM | POA: Diagnosis not present

## 2017-10-25 ENCOUNTER — Other Ambulatory Visit: Payer: Self-pay | Admitting: Neurosurgery

## 2017-10-25 DIAGNOSIS — M545 Low back pain: Secondary | ICD-10-CM

## 2017-10-28 ENCOUNTER — Ambulatory Visit (INDEPENDENT_AMBULATORY_CARE_PROVIDER_SITE_OTHER): Payer: Medicare Other

## 2017-10-28 DIAGNOSIS — Z5181 Encounter for therapeutic drug level monitoring: Secondary | ICD-10-CM | POA: Diagnosis not present

## 2017-10-28 DIAGNOSIS — I482 Chronic atrial fibrillation, unspecified: Secondary | ICD-10-CM

## 2017-10-28 LAB — POCT INR: INR: 1.6

## 2017-10-28 NOTE — Patient Instructions (Signed)
Take 1.5 tablets today, then resume same dosage 1 tablet daily except 1.5 tablets on Mondays, Wednesdays and Fridays.  Recheck INR in 2 weeks. Call us with any medication changes or concerns #[phone] [facility]

## 2017-11-02 ENCOUNTER — Ambulatory Visit
Admission: RE | Admit: 2017-11-02 | Discharge: 2017-11-02 | Disposition: A | Discharge status: Home / Self Care | Payer: Medicare Other | Source: Ambulatory Visit | Attending: Neurosurgery | Admitting: Neurosurgery

## 2017-11-02 DIAGNOSIS — M545 Low back pain: Secondary | ICD-10-CM

## 2017-11-02 DIAGNOSIS — M5126 Other intervertebral disc displacement, lumbar region: Secondary | ICD-10-CM | POA: Diagnosis not present

## 2017-11-10 DIAGNOSIS — M5416 Radiculopathy, lumbar region: Secondary | ICD-10-CM | POA: Diagnosis not present

## 2017-11-10 DIAGNOSIS — Z794 Long term (current) use of insulin: Secondary | ICD-10-CM | POA: Diagnosis not present

## 2017-11-10 DIAGNOSIS — M545 Low back pain: Secondary | ICD-10-CM | POA: Diagnosis not present

## 2017-11-10 DIAGNOSIS — M5489 Other dorsalgia: Secondary | ICD-10-CM | POA: Diagnosis not present

## 2017-11-10 DIAGNOSIS — S32040G Wedge compression fracture of fourth lumbar vertebra, subsequent encounter for fracture with delayed healing: Secondary | ICD-10-CM | POA: Diagnosis not present

## 2017-11-10 DIAGNOSIS — N183 Chronic kidney disease, stage 3 (moderate): Secondary | ICD-10-CM | POA: Diagnosis not present

## 2017-11-10 DIAGNOSIS — E1165 Type 2 diabetes mellitus with hyperglycemia: Secondary | ICD-10-CM | POA: Diagnosis not present

## 2017-11-10 DIAGNOSIS — I10 Essential (primary) hypertension: Secondary | ICD-10-CM | POA: Diagnosis not present

## 2017-11-11 ENCOUNTER — Ambulatory Visit (INDEPENDENT_AMBULATORY_CARE_PROVIDER_SITE_OTHER): Payer: Medicare Other | Admitting: *Deleted

## 2017-11-11 DIAGNOSIS — I482 Chronic atrial fibrillation, unspecified: Secondary | ICD-10-CM

## 2017-11-11 DIAGNOSIS — Z5181 Encounter for therapeutic drug level monitoring: Secondary | ICD-10-CM

## 2017-11-11 LAB — POCT INR: INR: 2.4

## 2017-11-11 NOTE — Patient Instructions (Signed)
Continue taking the same dosage, 1 tablet daily except 1.5 tablets on Mondays, Wednesdays and Fridays.  Recheck INR in 4 weeks. Call us with any medication changes or concerns #[phone] [facility]

## 2017-11-24 ENCOUNTER — Other Ambulatory Visit: Payer: Self-pay | Admitting: Cardiology

## 2017-12-08 DIAGNOSIS — I480 Paroxysmal atrial fibrillation: Secondary | ICD-10-CM | POA: Insufficient documentation

## 2017-12-08 DIAGNOSIS — J189 Pneumonia, unspecified organism: Secondary | ICD-10-CM | POA: Insufficient documentation

## 2017-12-08 DIAGNOSIS — H9190 Unspecified hearing loss, unspecified ear: Secondary | ICD-10-CM | POA: Insufficient documentation

## 2017-12-08 DIAGNOSIS — M1990 Unspecified osteoarthritis, unspecified site: Secondary | ICD-10-CM | POA: Insufficient documentation

## 2017-12-08 DIAGNOSIS — L0591 Pilonidal cyst without abscess: Secondary | ICD-10-CM | POA: Insufficient documentation

## 2017-12-08 DIAGNOSIS — B029 Zoster without complications: Secondary | ICD-10-CM | POA: Insufficient documentation

## 2017-12-08 DIAGNOSIS — I10 Essential (primary) hypertension: Secondary | ICD-10-CM | POA: Insufficient documentation

## 2017-12-08 DIAGNOSIS — E7800 Pure hypercholesterolemia, unspecified: Secondary | ICD-10-CM | POA: Insufficient documentation

## 2017-12-08 DIAGNOSIS — E119 Type 2 diabetes mellitus without complications: Secondary | ICD-10-CM | POA: Insufficient documentation

## 2017-12-08 DIAGNOSIS — I5032 Chronic diastolic (congestive) heart failure: Secondary | ICD-10-CM | POA: Insufficient documentation

## 2017-12-08 DIAGNOSIS — I499 Cardiac arrhythmia, unspecified: Secondary | ICD-10-CM | POA: Insufficient documentation

## 2017-12-10 ENCOUNTER — Ambulatory Visit (INDEPENDENT_AMBULATORY_CARE_PROVIDER_SITE_OTHER): Payer: Medicare Other | Admitting: *Deleted

## 2017-12-10 DIAGNOSIS — I482 Chronic atrial fibrillation, unspecified: Secondary | ICD-10-CM

## 2017-12-10 DIAGNOSIS — Z5181 Encounter for therapeutic drug level monitoring: Secondary | ICD-10-CM | POA: Diagnosis not present

## 2017-12-10 LAB — POCT INR: INR: 4.1

## 2017-12-10 NOTE — Patient Instructions (Signed)
Description   Do not take any Coumadin today and take 1/2 tablet tomorrow then continue taking the same dosage, 1 tablet daily except 1.5 tablets on Mondays, Wednesdays and Fridays.  Recheck INR in 2 weeks. Call us with any medication changes or concerns #[phone] [facility]

## 2017-12-13 DIAGNOSIS — M1711 Unilateral primary osteoarthritis, right knee: Secondary | ICD-10-CM | POA: Diagnosis not present

## 2017-12-29 ENCOUNTER — Ambulatory Visit (INDEPENDENT_AMBULATORY_CARE_PROVIDER_SITE_OTHER): Payer: Medicare Other | Admitting: *Deleted

## 2017-12-29 ENCOUNTER — Ambulatory Visit: Payer: Medicare Other | Admitting: Cardiology

## 2017-12-29 ENCOUNTER — Encounter: Payer: Self-pay | Admitting: Cardiology

## 2017-12-29 VITALS — BP 126/62 | HR 62 | Ht 70.0 in | Wt 191.0 lb

## 2017-12-29 DIAGNOSIS — E785 Hyperlipidemia, unspecified: Secondary | ICD-10-CM | POA: Diagnosis not present

## 2017-12-29 DIAGNOSIS — I5032 Chronic diastolic (congestive) heart failure: Secondary | ICD-10-CM

## 2017-12-29 DIAGNOSIS — Z5181 Encounter for therapeutic drug level monitoring: Secondary | ICD-10-CM

## 2017-12-29 DIAGNOSIS — I480 Paroxysmal atrial fibrillation: Secondary | ICD-10-CM | POA: Diagnosis not present

## 2017-12-29 DIAGNOSIS — I482 Chronic atrial fibrillation, unspecified: Secondary | ICD-10-CM

## 2017-12-29 LAB — POCT INR: INR: 4.6

## 2017-12-29 NOTE — Patient Instructions (Signed)
Description   Skip today's dose, tomorrow only take 1/2 tablet, then start taking 1 tablet daily except 1/2 tablet on  Wednesdays  Recheck INR in 2 weeks. Call us with any medication changes or concerns #[phone] [facility]

## 2017-12-29 NOTE — Progress Notes (Signed)
Electrophysiology Office Note   Date:  12/29/2017   ID:  DEMARRION MEIKLEJOHN, DOB 1924/06/01, MRN [id number]  PCP:  Shaune Pollack, MD  Electrophysiologist:  Regan Lemming, MD    Chief Complaint  Patient presents with  . Follow-up    PAF     History of Present Illness: JACEYON STROLE is a 84 y.o. male who presents today for electrophysiology evaluation.   He has a history of hypertension, diabetes, and hyperlipidemia. He was found to be in atrial fibrillation after a fall where he suffered a vertebral fracture.  He was found to have a pinched nerve around that vertebra and is planned to have injections performed. He was admitted for a HF exacerbation with worsening DOE and edema. His weight had increased by 20 lbs. BNP was elevated at 1068. It was thought that his AF was contributing to his HF episode. He was not cardioverted as he was planned to have a second steroid injection in his back in January.  Today, denies symptoms of palpitations, chest pain, shortness of breath, orthopnea, PND, lower extremity edema, claudication, dizziness, presyncope, syncope, bleeding, or neurologic sequela. The patient is tolerating medications without difficulties.  He recently had a mechanical fall, but has been doing well otherwise.  He is not noted any episodes of fatigue or shortness of breath.  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  Past Medical History:  Diagnosis Date  . Arthritis    "was in left knee before replacement; now have it in my right knee" (11/25/2016)  . BPH (benign prostatic hypertrophy)   . Chronic diastolic CHF (congestive heart failure) (HCC)    a. 02/2016 Echo: EF 60-65%, no rwma, triv AI, mild MR, mildly to mod dil LA, mod dil RA, PASP .  Marland Kitchen Dysrhythmia   . Essential hypertension   . Hard of hearing    wears bilateral hearing aids  . Hypercholesterolemia   . Osteoarthritis   . PAF (paroxysmal atrial  fibrillation) (HCC)    a. CHADS2VASC score of 4 --> coumadin.  . Pilonidal cyst    PAST HX - NO PROBLEM NOW  . Pneumonia    "one time; years ago" (11/25/2016)  . Shingles    "long time ago"  . Synovitis of knee 02/27/2014  . Type II diabetes mellitus (HCC)    oral meds - no insulin   Past Surgical History:  Procedure Laterality Date  . APPENDECTOMY    . CARDIOVERSION N/A 02/04/2017   Procedure: CARDIOVERSION;  Surgeon: [name], MD;  Location: [facility] ENDOSCOPY;  Service: Cardiovascular;  Laterality: N/A;  . CATARACT EXTRACTION W/ INTRAOCULAR LENS IMPLANT Left 10/2016  . CHOLECYSTECTOMY OPEN    . COLONOSCOPY W/ BIOPSIES  08/2005   [name] 04/27/2011  . EYE SURGERY Right    "right" traumatic cataract removed--injury to the eye--states his eyesight is ok in left eye  . JOINT REPLACEMENT    . KNEE ARTHROSCOPY Left 02/28/2014   Procedure: ARTHROSCOPY LEFT KNEE WITH SYNOVECTOMY;  Surgeon: [name], MD;  Location: [facility] ORS;  Service: Orthopedics;  Laterality: Left;  . KYPHOPLASTY  2017  . KYPHOPLASTY N/A 05/28/2017   Procedure: Lumbar Four Kyphoplasty;  Surgeon: Maeola Harman, MD;  Location: [facility] OR;  Service: Neurosurgery;  Laterality: N/A;  L4 Kyphoplasty  . SHOULDER OPEN ROTATOR CUFF REPAIR Left   . TONSILLECTOMY    . TOTAL KNEE ARTHROPLASTY  02/08/2012  Procedure: TOTAL KNEE ARTHROPLASTY;  Surgeon: [name], MD;  Location: [facility] ORS;  Service: Orthopedics;  Laterality: Left;  Marland Kitchen VASECTOMY       Current Outpatient Medications  Medication Sig Dispense Refill  . acetaminophen (TYLENOL) 325 MG tablet Take 325 mg by mouth every 6 (six) hours as needed for mild pain.    . furosemide (LASIX) 40 MG tablet Take 0.5 tablets (20 mg total) by mouth daily. 30 tablet 6  . Insulin Glargine (LANTUS SOLOSTAR) 100 UNIT/ML Solostar Pen Inject 20 Units into the skin at bedtime.     . potassium chloride SA (K-DUR,KLOR-CON) 20 MEQ tablet Take 1 tablet (20 mEq total) by mouth daily. 30 tablet 2    . simvastatin (ZOCOR) 20 MG tablet Take 20 mg by mouth at bedtime.    Marland Kitchen terazosin (HYTRIN) 5 MG capsule Take 5 mg by mouth at bedtime.    . traMADol (ULTRAM) 50 MG tablet Take 50 mg by mouth at bedtime as needed for pain.  0  . warfarin (COUMADIN) 5 MG tablet TAKE AS DIRECTED BY  COUMADIN  CLINIC 45 tablet 3   No current facility-administered medications for this visit.     Allergies:   Codeine and Oxycodone   Social History:  The patient  reports that  has never smoked. he has never used smokeless tobacco. He reports that he does not drink alcohol or use drugs.   Family History:  The patient's family history includes Heart attack in his brother; Heart disease in his brother and mother.    ROS:  Please see the history of present illness.   Otherwise, review of systems is positive for leg swelling, depression, back pain.   All other systems are reviewed and negative.   PHYSICAL EXAM: VS:  BP 126/62   Pulse 62   Ht 5\' 10"  (1.778 m)   Wt 191 lb (86.6 kg)   BMI 27.41 kg/m  , BMI Body mass index is 27.41 kg/m. GEN: Well nourished, well developed, in no acute distress  HEENT: normal  Neck: no JVD, carotid bruits, or masses Cardiac: RRR; no murmurs, rubs, or gallops,no edema  Respiratory:  clear to auscultation bilaterally, normal work of breathing GI: soft, nontender, nondistended, + BS MS: no deformity or atrophy  Skin: warm and dry Neuro:  Strength and sensation are intact Psych: euthymic mood, full affect  EKG:  EKG is ordered today. Personal review of the ekg ordered shows anus rhythm, first-degree AV block  Recent Labs: 05/24/2017: BUN 43; Creatinine, Ser 1.73; Hemoglobin 11.2; Platelets 193; Potassium 4.4; Sodium 138    Lipid Panel  No results found for: CHOL, TRIG, HDL, CHOLHDL, VLDL, LDLCALC, LDLDIRECT   Wt Readings from Last 3 Encounters:  12/29/17 191 lb (86.6 kg)  06/28/17 186 lb 3.2 oz (84.5 kg)  05/28/17 182 lb (82.6 kg)      Other studies  Reviewed: Additional studies/ records that were reviewed today include: 12/2010 MPI  Review of the above records today demonstrates:  ECG positive for ischemia, myoview with normal perfusion  11/26/16 TTE - Left ventricle: The cavity size was normal. Wall thickness was   increased in a pattern of moderate LVH. Systolic function was   normal. The estimated ejection fraction was in the range of 60%   to 65%. Wall motion was normal; there were no regional wall   motion abnormalities. - Mitral valve: There was moderate regurgitation. - Left atrium: The atrium was moderately dilated. - Right ventricle: The cavity  size was mildly dilated. Wall   thickness was normal. - Right atrium: The atrium was severely dilated. - Tricuspid valve: There was moderate regurgitation. - Pulmonary arteries: Systolic pressure was moderately increased.   PA peak pressure: 65 mm Hg (S).  ASSESSMENT AND PLAN:  1.  Persistent Atrial fibrillation: Sinus rhythm today and feeling well.  Not having a further issues.  We Ashtin Melichar make no further medical adjustments.  This patients CHA2DS2-VASc Score and unadjusted Ischemic Stroke Rate (% per year) is equal to 4.8 % stroke rate/year from a score of 4  Above score calculated as 1 point each if present [CHF, HTN, DM, Vascular=MI/PAD/Aortic Plaque, Age if 65-74, or Male] Above score calculated as 2 points each if present [Age > 75, or Stroke/TIA/TE]   2. Diastolic heart failure: Currently well compensated.  Is tolerating.  No changes.  3. Hyperlipidemia: Continue statin   Current medicines are reviewed at length with the patient today.   The patient does not have concerns regarding his medicines.  The following changes were made today:  none  Labs/ tests ordered today include:  Orders Placed This Encounter  Procedures  . EKG 12-Lead     Disposition:   FU with Dustyn Dansereau 6 months  Signed, Rhylin Venters Jorja LoaMartin Baby Stairs, MD  12/29/2017 10:02 AM     [facility] [address] [place] [address] [phone] (office) [phone] (fax)

## 2017-12-29 NOTE — Patient Instructions (Addendum)
Medication Instructions:  Your physician recommends that you continue on your current medications as directed. Please refer to the Current Medication list given to you today.  If you need a refill on your cardiac medications before your next appointment, please call your pharmacy.   Labwork: None ordered  Testing/Procedures: None ordered  Follow-Up: Your physician wants you to follow-up in: 6 months with Dr. Camnitz.  You will receive a reminder letter in the mail two months in advance. If you don't receive a letter, please call our office to schedule the follow-up appointment.  Thank you for choosing [facility]!!   Grigor Lipschutz, RN [phone]         

## 2018-01-12 ENCOUNTER — Ambulatory Visit: Payer: Medicare Other | Admitting: *Deleted

## 2018-01-12 DIAGNOSIS — I482 Chronic atrial fibrillation, unspecified: Secondary | ICD-10-CM

## 2018-01-12 DIAGNOSIS — Z5181 Encounter for therapeutic drug level monitoring: Secondary | ICD-10-CM

## 2018-01-12 DIAGNOSIS — I480 Paroxysmal atrial fibrillation: Secondary | ICD-10-CM | POA: Diagnosis not present

## 2018-01-12 LAB — POCT INR: INR: 3.3

## 2018-01-12 NOTE — Patient Instructions (Signed)
Description   Do not take coumadin today Jan 30th then change dose of coumadin to  1 tablet daily   Recheck INR in 2 weeks. Call us with any medication changes or concerns #[phone] [facility]

## 2018-01-26 ENCOUNTER — Ambulatory Visit: Payer: Medicare Other | Admitting: Pharmacist

## 2018-01-26 DIAGNOSIS — I482 Chronic atrial fibrillation, unspecified: Secondary | ICD-10-CM

## 2018-01-26 DIAGNOSIS — Z5181 Encounter for therapeutic drug level monitoring: Secondary | ICD-10-CM | POA: Diagnosis not present

## 2018-01-26 LAB — POCT INR: INR: 2.4

## 2018-01-26 NOTE — Patient Instructions (Signed)
Description   Continue coumadin 1 tablet daily   Recheck INR in 3 weeks. Call us with any medication changes or concerns #[phone] [facility]

## 2018-02-02 DIAGNOSIS — E119 Type 2 diabetes mellitus without complications: Secondary | ICD-10-CM | POA: Diagnosis not present

## 2018-02-09 DIAGNOSIS — Z9181 History of falling: Secondary | ICD-10-CM | POA: Diagnosis not present

## 2018-02-09 DIAGNOSIS — E1165 Type 2 diabetes mellitus with hyperglycemia: Secondary | ICD-10-CM | POA: Diagnosis not present

## 2018-02-09 DIAGNOSIS — I10 Essential (primary) hypertension: Secondary | ICD-10-CM | POA: Diagnosis not present

## 2018-02-09 DIAGNOSIS — Z794 Long term (current) use of insulin: Secondary | ICD-10-CM | POA: Diagnosis not present

## 2018-02-16 ENCOUNTER — Ambulatory Visit: Payer: Medicare Other | Admitting: *Deleted

## 2018-02-16 DIAGNOSIS — Z5181 Encounter for therapeutic drug level monitoring: Secondary | ICD-10-CM | POA: Diagnosis not present

## 2018-02-16 DIAGNOSIS — I480 Paroxysmal atrial fibrillation: Secondary | ICD-10-CM

## 2018-02-16 DIAGNOSIS — I482 Chronic atrial fibrillation, unspecified: Secondary | ICD-10-CM

## 2018-02-16 LAB — POCT INR: INR: 1.8

## 2018-02-16 NOTE — Patient Instructions (Signed)
Description   Today March 6th take 1 and 1/2 tablets then continue coumadin 1 tablet daily   Recheck INR in 2 weeks. Call us with any medication changes or concerns #[phone] [facility]

## 2018-03-02 ENCOUNTER — Ambulatory Visit: Payer: Medicare Other | Admitting: *Deleted

## 2018-03-02 DIAGNOSIS — Z5181 Encounter for therapeutic drug level monitoring: Secondary | ICD-10-CM | POA: Diagnosis not present

## 2018-03-02 DIAGNOSIS — I482 Chronic atrial fibrillation, unspecified: Secondary | ICD-10-CM

## 2018-03-02 LAB — POCT INR: INR: 1.9

## 2018-03-02 NOTE — Patient Instructions (Signed)
Description   Today take 1 and 1/2 tablets then change coumadin dose to 1 tablet daily except 1 and 1/2 tablets on  Sundays.  Recheck INR in 2 weeks. Call us with any medication changes or concerns #[phone] [facility]

## 2018-03-18 ENCOUNTER — Ambulatory Visit: Payer: Medicare Other | Admitting: *Deleted

## 2018-03-18 DIAGNOSIS — I482 Chronic atrial fibrillation, unspecified: Secondary | ICD-10-CM

## 2018-03-18 DIAGNOSIS — Z5181 Encounter for therapeutic drug level monitoring: Secondary | ICD-10-CM

## 2018-03-18 DIAGNOSIS — M1711 Unilateral primary osteoarthritis, right knee: Secondary | ICD-10-CM | POA: Diagnosis not present

## 2018-03-18 LAB — POCT INR: INR: 2

## 2018-03-18 NOTE — Patient Instructions (Signed)
Description   Continue taking  1 tablet daily except 1 and 1/2 tablets on  Sundays.  Recheck INR in 2 weeks. Call us with any medication changes or concerns #[phone] [facility]

## 2018-04-26 ENCOUNTER — Ambulatory Visit: Payer: Medicare Other | Admitting: *Deleted

## 2018-04-26 DIAGNOSIS — I482 Chronic atrial fibrillation, unspecified: Secondary | ICD-10-CM

## 2018-04-26 DIAGNOSIS — Z5181 Encounter for therapeutic drug level monitoring: Secondary | ICD-10-CM

## 2018-04-26 LAB — POCT INR: INR: 1.9

## 2018-04-26 NOTE — Patient Instructions (Signed)
Description   Today take 1.5 tablets, then change your dose to 1 tablet daily except 1.5 tablets on Sundays and Thursdays. Recheck INR in 2 weeks. Call us with any medication changes or concerns #[phone] [facility]

## 2018-05-10 ENCOUNTER — Ambulatory Visit: Payer: Medicare Other | Admitting: *Deleted

## 2018-05-10 DIAGNOSIS — I480 Paroxysmal atrial fibrillation: Secondary | ICD-10-CM | POA: Diagnosis not present

## 2018-05-10 DIAGNOSIS — I482 Chronic atrial fibrillation, unspecified: Secondary | ICD-10-CM

## 2018-05-10 DIAGNOSIS — Z5181 Encounter for therapeutic drug level monitoring: Secondary | ICD-10-CM | POA: Diagnosis not present

## 2018-05-10 LAB — POCT INR: INR: 2.5 (ref 2.0–3.0)

## 2018-05-10 NOTE — Patient Instructions (Signed)
Description   Continue same  dose  of coumadin 1 tablet daily except 1.5 tablets on Sundays and Thursdays. Recheck INR in 3 weeks. Call us with any medication changes or concerns #[phone] [facility]

## 2018-05-21 ENCOUNTER — Other Ambulatory Visit: Payer: Self-pay | Admitting: Cardiology

## 2018-05-23 DIAGNOSIS — E119 Type 2 diabetes mellitus without complications: Secondary | ICD-10-CM | POA: Diagnosis not present

## 2018-05-31 ENCOUNTER — Ambulatory Visit: Payer: Medicare Other | Admitting: *Deleted

## 2018-05-31 DIAGNOSIS — I482 Chronic atrial fibrillation, unspecified: Secondary | ICD-10-CM

## 2018-05-31 DIAGNOSIS — Z5181 Encounter for therapeutic drug level monitoring: Secondary | ICD-10-CM | POA: Diagnosis not present

## 2018-05-31 LAB — POCT INR: INR: 2.4 (ref 2.0–3.0)

## 2018-05-31 NOTE — Patient Instructions (Signed)
Description   Continue same  dose  of coumadin 1 tablet daily except 1.5 tablets on Sundays and Thursdays. Recheck INR in 4 weeks. Call us with any medication changes or concerns #[phone] [facility]

## 2018-06-21 ENCOUNTER — Encounter: Payer: Self-pay | Admitting: Cardiology

## 2018-06-28 ENCOUNTER — Ambulatory Visit: Payer: Medicare Other

## 2018-06-28 DIAGNOSIS — Z5181 Encounter for therapeutic drug level monitoring: Secondary | ICD-10-CM | POA: Diagnosis not present

## 2018-06-28 DIAGNOSIS — I482 Chronic atrial fibrillation, unspecified: Secondary | ICD-10-CM

## 2018-06-28 LAB — PROTIME-INR
INR: 6.6 — AB (ref 0.8–1.2)
Prothrombin Time: 62.7 s — ABNORMAL HIGH (ref 9.1–12.0)

## 2018-06-28 LAB — POCT INR: INR: 6.5 — AB (ref 2.0–3.0)

## 2018-07-06 ENCOUNTER — Encounter: Payer: Self-pay | Admitting: Cardiology

## 2018-07-06 ENCOUNTER — Ambulatory Visit: Payer: Medicare Other | Admitting: Cardiology

## 2018-07-06 ENCOUNTER — Ambulatory Visit (INDEPENDENT_AMBULATORY_CARE_PROVIDER_SITE_OTHER): Payer: Medicare Other | Admitting: *Deleted

## 2018-07-06 VITALS — BP 140/66 | HR 59 | Ht 70.0 in | Wt 192.6 lb

## 2018-07-06 DIAGNOSIS — Z5181 Encounter for therapeutic drug level monitoring: Secondary | ICD-10-CM

## 2018-07-06 DIAGNOSIS — I480 Paroxysmal atrial fibrillation: Secondary | ICD-10-CM

## 2018-07-06 DIAGNOSIS — E785 Hyperlipidemia, unspecified: Secondary | ICD-10-CM

## 2018-07-06 DIAGNOSIS — I482 Chronic atrial fibrillation, unspecified: Secondary | ICD-10-CM

## 2018-07-06 DIAGNOSIS — I5032 Chronic diastolic (congestive) heart failure: Secondary | ICD-10-CM | POA: Diagnosis not present

## 2018-07-06 LAB — POCT INR: INR: 2.6 (ref 2.0–3.0)

## 2018-07-06 NOTE — Patient Instructions (Signed)
Medication Instructions:  Your physician recommends that you continue on your current medications as directed. Please refer to the Current Medication list given to you today.  * If you need a refill on your cardiac medications before your next appointment, please call your pharmacy.   Labwork: None ordered *We will only notify you of abnormal results, otherwise continue current treatment plan.  Testing/Procedures: None ordered  Follow-Up: Your physician wants you to follow-up in: 6 months with Dr. Camnitz.  You will receive a reminder letter in the mail two months in advance. If you don't receive a letter, please call our office to schedule the follow-up appointment.  *Please note that any paperwork needing to be filled out by the provider will need to be addressed at the front desk prior to seeing the provider. Please note that any FMLA, disability or other documents regarding health condition is subject to a $25.00 charge that must be received prior to completion of paperwork in the form of a money order or check.  Thank you for choosing [facility]!!   Keyia Moretto, RN [phone]  Any Other Special Instructions Will Be Listed Below (If Applicable).        

## 2018-07-06 NOTE — Progress Notes (Signed)
Electrophysiology Office Note   Date:  07/06/2018   ID:  Daniel Cooke, DOB [birth year], MRN [id number]  PCP:  Shaune Pollack, MD  Electrophysiologist:  Regan Lemming, MD    Chief Complaint  Patient presents with  . Follow-up    PAF     History of Present Illness: Daniel Cooke is a 84 y.o. male who presents today for electrophysiology evaluation.   He has a history of hypertension, diabetes, and hyperlipidemia. He was found to be in atrial fibrillation after a fall where he suffered a vertebral fracture.  He was found to have a pinched nerve around that vertebra and is planned to have injections performed. He was admitted for a HF exacerbation with worsening DOE and edema. His weight had increased by 20 lbs. BNP was elevated at 1068. It was thought that his AF was contributing to his HF episode. He was not cardioverted as he was planned to have a second steroid injection in his back in January.  Today, denies symptoms of palpitations, chest pain, shortness of breath, orthopnea, PND, lower extremity edema, claudication, dizziness, presyncope, syncope, bleeding, or neurologic sequela. The patient is tolerating medications without difficulties.  Overall he is feeling well.  He has not noted any issues with his heart.  His main complaint is orthopedic.  He has chronic back pain and right knee pain.  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  Past Medical History:  Diagnosis Date  . Arthritis    "was in left knee before replacement; now have it in my right knee" (11/25/2016)  . BPH (benign prostatic hypertrophy)   . Chronic diastolic CHF (congestive heart failure) (HCC)    a. 02/2016 Echo: EF 60-65%, no rwma, triv AI, mild MR, mildly to mod dil LA, mod dil RA, PASP .  Marland Kitchen Dysrhythmia   . Essential hypertension   . Hard of hearing    wears bilateral hearing aids  . Hypercholesterolemia   . Osteoarthritis   . PAF  (paroxysmal atrial fibrillation) (HCC)    a. CHADS2VASC score of 4 --> coumadin.  . Pilonidal cyst    PAST HX - NO PROBLEM NOW  . Pneumonia    "one time; years ago" (11/25/2016)  . Shingles    "long time ago"  . Synovitis of knee 02/27/2014  . Type II diabetes mellitus (HCC)    oral meds - no insulin   Past Surgical History:  Procedure Laterality Date  . APPENDECTOMY    . CARDIOVERSION N/A 02/04/2017   Procedure: CARDIOVERSION;  Surgeon: [name], MD;  Location: [facility] ENDOSCOPY;  Service: Cardiovascular;  Laterality: N/A;  . CATARACT EXTRACTION W/ INTRAOCULAR LENS IMPLANT Left 10/2016  . CHOLECYSTECTOMY OPEN    . COLONOSCOPY W/ BIOPSIES  08/2005   Daniel Cooke 04/27/2011  . EYE SURGERY Right    "right" traumatic cataract removed--injury to the eye--states his eyesight is ok in left eye  . JOINT REPLACEMENT    . KNEE ARTHROSCOPY Left 02/28/2014   Procedure: ARTHROSCOPY LEFT KNEE WITH SYNOVECTOMY;  Surgeon: [name], MD;  Location: [facility] ORS;  Service: Orthopedics;  Laterality: Left;  . KYPHOPLASTY  2017  . KYPHOPLASTY N/A 05/28/2017   Procedure: Lumbar Four Kyphoplasty;  Surgeon: Maeola Harman, MD;  Location: [facility] OR;  Service: Neurosurgery;  Laterality: N/A;  L4 Kyphoplasty  . SHOULDER OPEN ROTATOR CUFF REPAIR Left   . TONSILLECTOMY    .  TOTAL KNEE ARTHROPLASTY  02/08/2012   Procedure: TOTAL KNEE ARTHROPLASTY;  Surgeon: [name], MD;  Location: [facility] ORS;  Service: Orthopedics;  Laterality: Left;  Marland Kitchen VASECTOMY       Current Outpatient Medications  Medication Sig Dispense Refill  . acetaminophen (TYLENOL) 325 MG tablet Take 325 mg by mouth every 6 (six) hours as needed for mild pain.    . furosemide (LASIX) 40 MG tablet Take 0.5 tablets (20 mg total) by mouth daily. 30 tablet 6  . Insulin Glargine (LANTUS SOLOSTAR) 100 UNIT/ML Solostar Pen Inject 20 Units into the skin at bedtime.     . potassium chloride SA (K-DUR,KLOR-CON) 20 MEQ tablet Take 1 tablet (20 mEq total) by mouth  daily. 30 tablet 2  . simvastatin (ZOCOR) 20 MG tablet Take 20 mg by mouth at bedtime.    Marland Kitchen terazosin (HYTRIN) 5 MG capsule Take 5 mg by mouth at bedtime.    . traMADol (ULTRAM) 50 MG tablet Take 50 mg by mouth at bedtime as needed for pain.  0  . warfarin (COUMADIN) 5 MG tablet TAKE AS DIRECTED BY  COUMADIN  CLINIC 45 tablet 3   No current facility-administered medications for this visit.     Allergies:   Codeine and Oxycodone   Social History:  The patient  reports that he has never smoked. He has never used smokeless tobacco. He reports that he does not drink alcohol or use drugs.   Family History:  The patient's family history includes Heart attack in his brother; Heart disease in his brother and mother.    ROS:  Please see the history of present illness.   Otherwise, review of systems is positive for joint pain.   All other systems are reviewed and negative.   PHYSICAL EXAM: VS:  BP 140/66   Pulse (!) 59   Ht 5\' 10"  (1.778 m)   Wt 192 lb 9.6 oz (87.4 kg)   SpO2 97%   BMI 27.64 kg/m  , BMI Body mass index is 27.64 kg/m. GEN: Well nourished, well developed, in no acute distress  HEENT: normal  Neck: no JVD, carotid bruits, or masses Cardiac: RRR; no murmurs, rubs, or gallops,no edema  Respiratory:  clear to auscultation bilaterally, normal work of breathing GI: soft, nontender, nondistended, + BS MS: no deformity or atrophy  Skin: warm and dry Neuro:  Strength and sensation are intact Psych: euthymic mood, full affect  EKG:  EKG is ordered today. Personal review of the ekg ordered shows sinus rhythm, first-degree AV block, rate 59, PACs  Recent Labs: No results found for requested labs within last 8760 hours.    Lipid Panel  No results found for: CHOL, TRIG, HDL, CHOLHDL, VLDL, LDLCALC, LDLDIRECT   Wt Readings from Last 3 Encounters:  07/06/18 192 lb 9.6 oz (87.4 kg)  12/29/17 191 lb (86.6 kg)  06/28/17 186 lb 3.2 oz (84.5 kg)      Other studies  Reviewed: Additional studies/ records that were reviewed today include: 12/2010 MPI  Review of the above records today demonstrates:  ECG positive for ischemia, myoview with normal perfusion  11/26/16 TTE - Left ventricle: The cavity size was normal. Wall thickness was   increased in a pattern of moderate LVH. Systolic function was   normal. The estimated ejection fraction was in the range of 60%   to 65%. Wall motion was normal; there were no regional wall   motion abnormalities. - Mitral valve: There was moderate regurgitation. - Left  atrium: The atrium was moderately dilated. - Right ventricle: The cavity size was mildly dilated. Wall   thickness was normal. - Right atrium: The atrium was severely dilated. - Tricuspid valve: There was moderate regurgitation. - Pulmonary arteries: Systolic pressure was moderately increased.   PA peak pressure: 65 mm Hg (S).  ASSESSMENT AND PLAN:  1.  Persistent Atrial fibrillation: On Coumadin.  Currently in sinus rhythm.  Feeling well.  No changes.  This patients CHA2DS2-VASc Score and unadjusted Ischemic Stroke Rate (% per year) is equal to 4.8 % stroke rate/year from a score of 4  Above score calculated as 1 point each if present [CHF, HTN, DM, Vascular=MI/PAD/Aortic Plaque, Age if 65-74, or Male] Above score calculated as 2 points each if present [Age > 75, or Stroke/TIA/TE]   2. Diastolic heart failure: Well compensated.  Tolerating medications.  No changes.  3. Hyperlipidemia: Continue statin   Current medicines are reviewed at length with the patient today.   The patient does not have concerns regarding his medicines.  The following changes were made today: None  Labs/ tests ordered today include:  Orders Placed This Encounter  Procedures  . EKG 12-Lead     Disposition:   FU with Daniel Cooke 6 months  Signed, Daniel Bently Jorja LoaMartin Beverley Sherrard, MD  07/06/2018 9:27 AM     [facility] [address] [place]  [address] [phone] (office) [phone] (fax)

## 2018-07-06 NOTE — Patient Instructions (Signed)
Description   Continue taking 1 tablet daily except 1.5 tablets on Thursdays.  Recheck INR in 2 weeks. Call us with any medication changes or concerns #[phone] [facility]

## 2018-07-20 ENCOUNTER — Ambulatory Visit: Payer: Medicare Other | Admitting: *Deleted

## 2018-07-20 DIAGNOSIS — I482 Chronic atrial fibrillation, unspecified: Secondary | ICD-10-CM

## 2018-07-20 DIAGNOSIS — Z5181 Encounter for therapeutic drug level monitoring: Secondary | ICD-10-CM

## 2018-07-20 LAB — POCT INR: INR: 4.8 — AB (ref 2.0–3.0)

## 2018-07-20 NOTE — Patient Instructions (Addendum)
Description   Hold today and tomorrow's dose then continue taking 1 tablet daily except 1.5 tablets on Thursdays.  Recheck INR in 2 weeks. Call us with any medication changes or concerns #[phone] [facility]

## 2018-07-28 DIAGNOSIS — Z96652 Presence of left artificial knee joint: Secondary | ICD-10-CM | POA: Diagnosis not present

## 2018-07-28 DIAGNOSIS — M25562 Pain in left knee: Secondary | ICD-10-CM | POA: Diagnosis not present

## 2018-07-28 DIAGNOSIS — M1711 Unilateral primary osteoarthritis, right knee: Secondary | ICD-10-CM | POA: Diagnosis not present

## 2018-08-03 ENCOUNTER — Ambulatory Visit: Payer: Medicare Other

## 2018-08-03 DIAGNOSIS — Z5181 Encounter for therapeutic drug level monitoring: Secondary | ICD-10-CM | POA: Diagnosis not present

## 2018-08-03 DIAGNOSIS — I482 Chronic atrial fibrillation, unspecified: Secondary | ICD-10-CM

## 2018-08-03 LAB — POCT INR: INR: 3.4 — AB (ref 2.0–3.0)

## 2018-08-03 NOTE — Patient Instructions (Signed)
Description   Hold today then start taking 1 tablet daily. Recheck INR in 2 weeks. Call us with any medication changes or concerns #[phone] [facility]

## 2018-08-10 DIAGNOSIS — N183 Chronic kidney disease, stage 3 (moderate): Secondary | ICD-10-CM | POA: Diagnosis not present

## 2018-08-10 DIAGNOSIS — I10 Essential (primary) hypertension: Secondary | ICD-10-CM | POA: Diagnosis not present

## 2018-08-10 DIAGNOSIS — E1165 Type 2 diabetes mellitus with hyperglycemia: Secondary | ICD-10-CM | POA: Diagnosis not present

## 2018-08-10 DIAGNOSIS — Z0000 Encounter for general adult medical examination without abnormal findings: Secondary | ICD-10-CM | POA: Diagnosis not present

## 2018-08-17 ENCOUNTER — Ambulatory Visit: Payer: Medicare Other | Admitting: Pharmacist

## 2018-08-17 DIAGNOSIS — Z5181 Encounter for therapeutic drug level monitoring: Secondary | ICD-10-CM

## 2018-08-17 DIAGNOSIS — I482 Chronic atrial fibrillation, unspecified: Secondary | ICD-10-CM

## 2018-08-17 LAB — POCT INR: INR: 2.6 (ref 2.0–3.0)

## 2018-08-17 NOTE — Patient Instructions (Signed)
Description   Continue 1 tablet daily. Recheck INR in 3 weeks. Call us with any medication changes or concerns #[phone] [facility]

## 2018-08-22 ENCOUNTER — Other Ambulatory Visit: Payer: Self-pay | Admitting: Cardiology

## 2018-09-02 DIAGNOSIS — M1711 Unilateral primary osteoarthritis, right knee: Secondary | ICD-10-CM | POA: Diagnosis not present

## 2018-09-04 DIAGNOSIS — E119 Type 2 diabetes mellitus without complications: Secondary | ICD-10-CM | POA: Diagnosis not present

## 2018-09-09 DIAGNOSIS — M1711 Unilateral primary osteoarthritis, right knee: Secondary | ICD-10-CM | POA: Diagnosis not present

## 2018-09-12 ENCOUNTER — Ambulatory Visit: Payer: Medicare Other | Admitting: Pharmacist

## 2018-09-12 DIAGNOSIS — Z5181 Encounter for therapeutic drug level monitoring: Secondary | ICD-10-CM | POA: Diagnosis not present

## 2018-09-12 DIAGNOSIS — I482 Chronic atrial fibrillation, unspecified: Secondary | ICD-10-CM

## 2018-09-12 LAB — POCT INR: INR: 2.1 (ref 2.0–3.0)

## 2018-09-12 NOTE — Patient Instructions (Signed)
Description   Continue 1 tablet daily. Recheck INR in 4 weeks. Call us with any medication changes or concerns #[phone] [facility]

## 2018-09-16 DIAGNOSIS — M1711 Unilateral primary osteoarthritis, right knee: Secondary | ICD-10-CM | POA: Diagnosis not present

## 2018-09-26 ENCOUNTER — Encounter (INDEPENDENT_AMBULATORY_CARE_PROVIDER_SITE_OTHER): Payer: Medicare Other | Admitting: Ophthalmology

## 2018-09-26 DIAGNOSIS — H35033 Hypertensive retinopathy, bilateral: Secondary | ICD-10-CM | POA: Diagnosis not present

## 2018-09-26 DIAGNOSIS — H4423 Degenerative myopia, bilateral: Secondary | ICD-10-CM

## 2018-09-26 DIAGNOSIS — I10 Essential (primary) hypertension: Secondary | ICD-10-CM

## 2018-09-26 DIAGNOSIS — H353122 Nonexudative age-related macular degeneration, left eye, intermediate dry stage: Secondary | ICD-10-CM | POA: Diagnosis not present

## 2018-09-26 DIAGNOSIS — H338 Other retinal detachments: Secondary | ICD-10-CM

## 2018-09-26 DIAGNOSIS — H43813 Vitreous degeneration, bilateral: Secondary | ICD-10-CM

## 2018-09-28 DIAGNOSIS — Z23 Encounter for immunization: Secondary | ICD-10-CM | POA: Diagnosis not present

## 2018-10-10 ENCOUNTER — Ambulatory Visit: Payer: Medicare Other

## 2018-10-10 DIAGNOSIS — Z5181 Encounter for therapeutic drug level monitoring: Secondary | ICD-10-CM

## 2018-10-10 DIAGNOSIS — I482 Chronic atrial fibrillation, unspecified: Secondary | ICD-10-CM | POA: Diagnosis not present

## 2018-10-10 LAB — POCT INR: INR: 2.2 (ref 2.0–3.0)

## 2018-10-10 NOTE — Patient Instructions (Signed)
Description   Continue on same dosage 1 tablet daily. Recheck INR in 5 weeks. Call us with any medication changes or concerns #[phone] [facility]

## 2018-10-28 ENCOUNTER — Other Ambulatory Visit: Payer: Self-pay | Admitting: Cardiology

## 2018-10-28 DIAGNOSIS — M1611 Unilateral primary osteoarthritis, right hip: Secondary | ICD-10-CM | POA: Diagnosis not present

## 2018-11-08 DIAGNOSIS — H903 Sensorineural hearing loss, bilateral: Secondary | ICD-10-CM | POA: Diagnosis not present

## 2018-11-14 ENCOUNTER — Ambulatory Visit: Payer: Medicare Other | Admitting: *Deleted

## 2018-11-14 DIAGNOSIS — Z5181 Encounter for therapeutic drug level monitoring: Secondary | ICD-10-CM | POA: Diagnosis not present

## 2018-11-14 DIAGNOSIS — I482 Chronic atrial fibrillation, unspecified: Secondary | ICD-10-CM

## 2018-11-14 LAB — POCT INR: INR: 2.3 (ref 2.0–3.0)

## 2018-11-14 NOTE — Patient Instructions (Signed)
Description   Continue on same dosage 1 tablet daily. Recheck INR in 6 weeks. Call us with any medication changes or concerns #[phone] [facility]

## 2018-11-17 DIAGNOSIS — Z794 Long term (current) use of insulin: Secondary | ICD-10-CM | POA: Diagnosis not present

## 2018-11-17 DIAGNOSIS — I10 Essential (primary) hypertension: Secondary | ICD-10-CM | POA: Diagnosis not present

## 2018-11-17 DIAGNOSIS — E1165 Type 2 diabetes mellitus with hyperglycemia: Secondary | ICD-10-CM | POA: Diagnosis not present

## 2018-11-17 DIAGNOSIS — I5022 Chronic systolic (congestive) heart failure: Secondary | ICD-10-CM | POA: Diagnosis not present

## 2018-12-08 DIAGNOSIS — N184 Chronic kidney disease, stage 4 (severe): Secondary | ICD-10-CM | POA: Diagnosis not present

## 2018-12-27 ENCOUNTER — Ambulatory Visit: Payer: Medicare Other

## 2018-12-27 DIAGNOSIS — Z5181 Encounter for therapeutic drug level monitoring: Secondary | ICD-10-CM | POA: Diagnosis not present

## 2018-12-27 DIAGNOSIS — I482 Chronic atrial fibrillation, unspecified: Secondary | ICD-10-CM | POA: Diagnosis not present

## 2018-12-27 LAB — POCT INR: INR: 2 (ref 2.0–3.0)

## 2018-12-27 NOTE — Patient Instructions (Signed)
Continue on same dosage 1 tablet daily. Recheck INR in 6 weeks. Call us with any medication changes or concerns #[phone] [facility]

## 2019-01-23 DIAGNOSIS — M545 Low back pain: Secondary | ICD-10-CM | POA: Diagnosis not present

## 2019-01-23 DIAGNOSIS — M5126 Other intervertebral disc displacement, lumbar region: Secondary | ICD-10-CM | POA: Diagnosis not present

## 2019-01-23 DIAGNOSIS — M5416 Radiculopathy, lumbar region: Secondary | ICD-10-CM | POA: Diagnosis not present

## 2019-01-23 DIAGNOSIS — S32040G Wedge compression fracture of fourth lumbar vertebra, subsequent encounter for fracture with delayed healing: Secondary | ICD-10-CM | POA: Diagnosis not present

## 2019-01-26 ENCOUNTER — Encounter: Payer: Self-pay | Admitting: Pharmacist

## 2019-01-26 ENCOUNTER — Telehealth: Payer: Self-pay | Admitting: *Deleted

## 2019-01-26 DIAGNOSIS — H01002 Unspecified blepharitis right lower eyelid: Secondary | ICD-10-CM | POA: Diagnosis not present

## 2019-01-26 DIAGNOSIS — H01001 Unspecified blepharitis right upper eyelid: Secondary | ICD-10-CM | POA: Diagnosis not present

## 2019-01-26 NOTE — Telephone Encounter (Signed)
This encounter was created in error - please disregard.

## 2019-01-26 NOTE — Telephone Encounter (Signed)
   [facility] Pre-operative Risk Assessment    Request for surgical clearance:  1. What type of surgery is being performed? RIGHT L3-4 ESI   2. When is this surgery scheduled? 02/13/19   3. What type of clearance is required (medical clearance vs. Pharmacy clearance to hold med vs. Both)? BOTH  4. Are there any medications that need to be held prior to surgery and how long?WARFARIN X 5 DAYS PRIOR   5. Practice name and name of physician performing surgery? [facility]; DR. HARKINS   6. What is your office phone number [phone]    7.   What is your office fax number [phone]  8.   Anesthesia type (None, local, MAC, general) ? NONE LISTED   [name] 01/26/2019, 4:43 PM  _________________________________________________________________   (provider comments below)

## 2019-01-27 NOTE — Telephone Encounter (Signed)
Follow up   Patient's daughter is returning  call would like to speak to Micah Flesher.

## 2019-01-27 NOTE — Telephone Encounter (Signed)
Patient takes warfarin for afib with CHADS2VASc score of 5 (age x2, CHF, HTN, DM). Ok to hold warfarin for 5 days prior to procedure.

## 2019-01-27 NOTE — Telephone Encounter (Signed)
   Primary Cardiologist: No primary care provider on file.  Chart reviewed as part of pre-operative protocol coverage. Patient was contacted 01/27/2019 in reference to pre-operative risk assessment for pending surgery as outlined below.  Daniel Cooke was last seen on 07/06/18 by Dr. Elberta Fortis.  Since that day, Daniel Cooke has done well. I spoke with his daughter and pt. He denies changes in medical history and denies new cardiac symptoms and no anginal symptoms.   Per our pharmacy staff: Patient takes warfarin for afib with CHADS2VASc score of 5 (age x2, CHF, HTN, DM). Ok to hold warfarin for 5 days prior to procedure.  Therefore, based on ACC/AHA guidelines, the patient would be at acceptable risk for the planned procedure without further cardiovascular testing.   I will route this recommendation to the requesting party via Epic fax function and remove from pre-op pool.  Please call with questions.  Marcelino Duster, PA 01/27/2019, 12:39 PM

## 2019-02-07 ENCOUNTER — Ambulatory Visit: Payer: Medicare Other | Admitting: *Deleted

## 2019-02-07 DIAGNOSIS — Z5181 Encounter for therapeutic drug level monitoring: Secondary | ICD-10-CM

## 2019-02-07 DIAGNOSIS — I482 Chronic atrial fibrillation, unspecified: Secondary | ICD-10-CM

## 2019-02-07 LAB — POCT INR: INR: 2 (ref 2.0–3.0)

## 2019-02-07 NOTE — Patient Instructions (Addendum)
Description   Continue on same dosage 1 tablet daily. Recheck INR in 1 week after procedure. Call us with any medication changes or concerns #[phone] [facility]     02/07/2019-Last dose of Coumadin  02/08/2019-Start holding for procedure.  02/13/2019-Resume Coumadin at normal dose.

## 2019-02-10 DIAGNOSIS — E1165 Type 2 diabetes mellitus with hyperglycemia: Secondary | ICD-10-CM | POA: Diagnosis not present

## 2019-02-10 DIAGNOSIS — I4891 Unspecified atrial fibrillation: Secondary | ICD-10-CM | POA: Diagnosis not present

## 2019-02-10 DIAGNOSIS — I10 Essential (primary) hypertension: Secondary | ICD-10-CM | POA: Diagnosis not present

## 2019-02-10 DIAGNOSIS — N183 Chronic kidney disease, stage 3 (moderate): Secondary | ICD-10-CM | POA: Diagnosis not present

## 2019-02-13 ENCOUNTER — Telehealth: Payer: Self-pay | Admitting: Cardiology

## 2019-02-13 DIAGNOSIS — M545 Low back pain: Secondary | ICD-10-CM | POA: Diagnosis not present

## 2019-02-13 NOTE — Telephone Encounter (Signed)
I will send to Pharm-D's and ask for them to please discuss with pt's daughter.

## 2019-02-13 NOTE — Telephone Encounter (Signed)
Spoke with patient's daughter and advised pt to resume normal dose of warfarin tonight after his spinal injection. She verbalized understanding and had no further questions.

## 2019-02-13 NOTE — Telephone Encounter (Signed)
  Patient is having a spinal epidural shot today and was told when to go off his Wafarin but daughter is calling asking when he can go back on his medication. Please call with instructions

## 2019-02-20 ENCOUNTER — Ambulatory Visit: Payer: Medicare Other | Admitting: *Deleted

## 2019-02-20 DIAGNOSIS — I482 Chronic atrial fibrillation, unspecified: Secondary | ICD-10-CM | POA: Diagnosis not present

## 2019-02-20 DIAGNOSIS — N183 Chronic kidney disease, stage 3 (moderate): Secondary | ICD-10-CM | POA: Diagnosis not present

## 2019-02-20 DIAGNOSIS — I4891 Unspecified atrial fibrillation: Secondary | ICD-10-CM | POA: Diagnosis not present

## 2019-02-20 DIAGNOSIS — Z5181 Encounter for therapeutic drug level monitoring: Secondary | ICD-10-CM | POA: Diagnosis not present

## 2019-02-20 DIAGNOSIS — E1165 Type 2 diabetes mellitus with hyperglycemia: Secondary | ICD-10-CM | POA: Diagnosis not present

## 2019-02-20 LAB — POCT INR: INR: 1.5 — AB (ref 2.0–3.0)

## 2019-02-20 NOTE — Patient Instructions (Signed)
Description   Today and tomorrow take 1.5 tablets then continue on same dosage 1 tablet daily.  Recheck INR in 1 week. Call us with any medication changes or concerns #[phone] [facility]

## 2019-02-28 ENCOUNTER — Other Ambulatory Visit: Payer: Self-pay

## 2019-02-28 ENCOUNTER — Ambulatory Visit: Payer: Medicare Other | Admitting: Pharmacist

## 2019-02-28 DIAGNOSIS — I482 Chronic atrial fibrillation, unspecified: Secondary | ICD-10-CM | POA: Diagnosis not present

## 2019-02-28 DIAGNOSIS — Z5181 Encounter for therapeutic drug level monitoring: Secondary | ICD-10-CM

## 2019-02-28 LAB — POCT INR: INR: 1.5 — AB (ref 2.0–3.0)

## 2019-02-28 MED ORDER — APIXABAN 2.5 MG PO TABS: 2.5000 mg | ORAL_TABLET | Freq: Two times a day (BID) | ORAL | 5 refills | Status: DC

## 2019-02-28 NOTE — Patient Instructions (Signed)
Description   Take 2 tablets today, 1.5 tablets tomorrow, then start taking 1 tablet daily except 1.5 tablets on Sundays.  Recheck INR in 2 weeks. Call us with any medication changes or concerns #[phone] [facility]

## 2019-02-28 NOTE — Addendum Note (Signed)
Addended by: SUPPLE, MEGAN E on: 02/28/2019 03:24 PM   Modules accepted: Orders

## 2019-02-28 NOTE — Addendum Note (Signed)
Addended by: SUPPLE, MEGAN E on: 02/28/2019 03:23 PM   Modules accepted: Orders

## 2019-03-23 ENCOUNTER — Telehealth: Payer: Self-pay | Admitting: *Deleted

## 2019-03-23 NOTE — Telephone Encounter (Signed)
   [facility] Pre-operative Risk Assessment    Request for surgical clearance:  1. What type of surgery is being performed? ESI LUMBAR   2. When is this surgery scheduled? TBD   3. What type of clearance is required (medical clearance vs. Pharmacy clearance to hold med vs. Both)? BOTH  4. Are there any medications that need to be held prior to surgery and how long? ELIQUIS 3 DAYS PRIOR   5. Practice name and name of physician performing surgery? [facility]; DR. HARKINS  6. What is your office phone number [phone]    7.   What is your office fax number [phone]  8.   Anesthesia type (None, local, MAC, general) ? NONE LISTED    Daniel Cooke 03/23/2019, 2:31 PM  _________________________________________________________________   (provider comments below)

## 2019-03-23 NOTE — Telephone Encounter (Signed)
   Primary Cardiologist: No primary care provider on file.  Chart reviewed as part of pre-operative protocol coverage. Patient was contacted 03/23/2019 in reference to pre-operative risk assessment for pending surgery as outlined below.  Daniel Cooke was last seen on  07/06/2018 by Dr. Elberta Fortis.  Since that day, Daniel Cooke has been doing welll. I spoke with his daughter who states he has no complaints.  Therefore, based on ACC/AHA guidelines, the patient would be at acceptable risk for the planned procedure without further cardiovascular testing.   Per pharmacy: Patient with diagnosis of afib on Eliquis for anticoagulation.    Procedure: ESI lumbar Date of procedure: TBD  CHADS2-VASc score of  5 (CHF, HTN, AGE, DM2, stroke/tia x 2, CAD, AGE, male)  Per office protocol, patient can hold Eliquis for 3 days prior to procedure.    I will route this recommendation to the requesting party via Epic fax function and remove from pre-op pool.  Please call with questions.  Daniel Chard, NP 03/23/2019, 4:04 PM

## 2019-03-23 NOTE — Telephone Encounter (Signed)
Patient with diagnosis of afib on Eliquis for anticoagulation.    Procedure: ESI lumbar Date of procedure: TBD  CHADS2-VASc score of  5 (CHF, HTN, AGE, DM2, stroke/tia x 2, CAD, AGE, male)  Per office protocol, patient can hold Eliquis for 3 days prior to procedure.

## 2019-04-19 ENCOUNTER — Telehealth: Payer: Self-pay | Admitting: Cardiology

## 2019-04-19 MED ORDER — APIXABAN 2.5 MG PO TABS: 2.5000 mg | ORAL_TABLET | Freq: Two times a day (BID) | ORAL | 0 refills | Status: DC

## 2019-04-19 NOTE — Telephone Encounter (Signed)
Last OV 07/06/18 Weight 87kg Age 85 Scr 2 on 02/20/2019  eliquis 2.5mg  BID sent to pharmacy-90 ds

## 2019-04-19 NOTE — Telephone Encounter (Signed)
New Message    *STAT* If patient is at the pharmacy, call can be transferred to refill team.   1. Which medications need to be refilled? (please list name of each medication and dose if known) apixaban (ELIQUIS) 2.5 MG TABS tablet    2. Which pharmacy/location (including street and city if local pharmacy) is medication to be sent to? [facility] [place], [place] - [address]  3. Do they need a 30 day or 90 day supply? 90 day

## 2019-04-20 DIAGNOSIS — M545 Low back pain: Secondary | ICD-10-CM | POA: Diagnosis not present

## 2019-04-26 DIAGNOSIS — R262 Difficulty in walking, not elsewhere classified: Secondary | ICD-10-CM | POA: Diagnosis not present

## 2019-04-26 DIAGNOSIS — M545 Low back pain: Secondary | ICD-10-CM | POA: Diagnosis not present

## 2019-04-26 DIAGNOSIS — M2560 Stiffness of unspecified joint, not elsewhere classified: Secondary | ICD-10-CM | POA: Diagnosis not present

## 2019-04-26 DIAGNOSIS — M5416 Radiculopathy, lumbar region: Secondary | ICD-10-CM | POA: Diagnosis not present

## 2019-05-01 ENCOUNTER — Other Ambulatory Visit: Payer: Medicare Other

## 2019-05-01 ENCOUNTER — Telehealth: Payer: Self-pay | Admitting: *Deleted

## 2019-05-01 NOTE — Telephone Encounter (Signed)
Received a voicemail from pt's daughter. Attempted to call back. Left message.

## 2019-05-02 DIAGNOSIS — M5416 Radiculopathy, lumbar region: Secondary | ICD-10-CM | POA: Diagnosis not present

## 2019-05-02 DIAGNOSIS — M2560 Stiffness of unspecified joint, not elsewhere classified: Secondary | ICD-10-CM | POA: Diagnosis not present

## 2019-05-02 DIAGNOSIS — R262 Difficulty in walking, not elsewhere classified: Secondary | ICD-10-CM | POA: Diagnosis not present

## 2019-05-02 DIAGNOSIS — M545 Low back pain: Secondary | ICD-10-CM | POA: Diagnosis not present

## 2019-05-02 NOTE — Telephone Encounter (Signed)
Returned a call to follow up and she stated she called this morning and was able to schedule pt's appointment this morning.

## 2019-05-05 DIAGNOSIS — R262 Difficulty in walking, not elsewhere classified: Secondary | ICD-10-CM | POA: Diagnosis not present

## 2019-05-05 DIAGNOSIS — M2560 Stiffness of unspecified joint, not elsewhere classified: Secondary | ICD-10-CM | POA: Diagnosis not present

## 2019-05-05 DIAGNOSIS — M545 Low back pain: Secondary | ICD-10-CM | POA: Diagnosis not present

## 2019-05-05 DIAGNOSIS — M5416 Radiculopathy, lumbar region: Secondary | ICD-10-CM | POA: Diagnosis not present

## 2019-05-09 DIAGNOSIS — M545 Low back pain: Secondary | ICD-10-CM | POA: Diagnosis not present

## 2019-05-09 DIAGNOSIS — M5416 Radiculopathy, lumbar region: Secondary | ICD-10-CM | POA: Diagnosis not present

## 2019-05-09 DIAGNOSIS — R262 Difficulty in walking, not elsewhere classified: Secondary | ICD-10-CM | POA: Diagnosis not present

## 2019-05-09 DIAGNOSIS — M2560 Stiffness of unspecified joint, not elsewhere classified: Secondary | ICD-10-CM | POA: Diagnosis not present

## 2019-05-10 ENCOUNTER — Other Ambulatory Visit: Payer: Self-pay

## 2019-05-10 ENCOUNTER — Other Ambulatory Visit: Payer: Medicare Other | Admitting: *Deleted

## 2019-05-10 DIAGNOSIS — I482 Chronic atrial fibrillation, unspecified: Secondary | ICD-10-CM

## 2019-05-10 DIAGNOSIS — Z5181 Encounter for therapeutic drug level monitoring: Secondary | ICD-10-CM | POA: Diagnosis not present

## 2019-05-12 DIAGNOSIS — R262 Difficulty in walking, not elsewhere classified: Secondary | ICD-10-CM | POA: Diagnosis not present

## 2019-05-12 DIAGNOSIS — M545 Low back pain: Secondary | ICD-10-CM | POA: Diagnosis not present

## 2019-05-12 DIAGNOSIS — M5416 Radiculopathy, lumbar region: Secondary | ICD-10-CM | POA: Diagnosis not present

## 2019-05-12 DIAGNOSIS — M2560 Stiffness of unspecified joint, not elsewhere classified: Secondary | ICD-10-CM | POA: Diagnosis not present

## 2019-05-12 LAB — CBC
Hematocrit: 36.4 % — ABNORMAL LOW (ref 37.5–51.0)
Hemoglobin: 11.7 g/dL — ABNORMAL LOW (ref 13.0–17.7)
MCH: 31.2 pg (ref 26.6–33.0)
MCHC: 32.1 g/dL (ref 31.5–35.7)
MCV: 97 fL (ref 79–97)
Platelets: 195 10*3/uL (ref 150–450)
RBC: 3.75 x10E6/uL — ABNORMAL LOW (ref 4.14–5.80)
RDW: 12.7 % (ref 11.6–15.4)
WBC: 6.2 10*3/uL (ref 3.4–10.8)

## 2019-05-12 LAB — BASIC METABOLIC PANEL
BUN/Creatinine Ratio: 22 (ref 10–24)
BUN: 37 mg/dL — ABNORMAL HIGH (ref 10–36)
CO2: 23 mmol/L (ref 20–29)
Calcium: 10.1 mg/dL (ref 8.6–10.2)
Chloride: 102 mmol/L (ref 96–106)
Creatinine, Ser: 1.65 mg/dL — ABNORMAL HIGH (ref 0.76–1.27)
GFR calc Af Amer: 41 mL/min/{1.73_m2} — ABNORMAL LOW (ref >59)
GFR calc non Af Amer: 35 mL/min/{1.73_m2} — ABNORMAL LOW (ref >59)
Glucose: 200 mg/dL — ABNORMAL HIGH (ref 65–99)
Potassium: 4.8 mmol/L (ref 3.5–5.2)
Sodium: 142 mmol/L (ref 134–144)

## 2019-05-16 DIAGNOSIS — S91119A Laceration without foreign body of unspecified toe without damage to nail, initial encounter: Secondary | ICD-10-CM | POA: Diagnosis not present

## 2019-05-18 DIAGNOSIS — M1711 Unilateral primary osteoarthritis, right knee: Secondary | ICD-10-CM | POA: Diagnosis not present

## 2019-05-18 DIAGNOSIS — S92404A Nondisplaced unspecified fracture of right great toe, initial encounter for closed fracture: Secondary | ICD-10-CM | POA: Diagnosis not present

## 2019-05-23 DIAGNOSIS — N183 Chronic kidney disease, stage 3 (moderate): Secondary | ICD-10-CM | POA: Diagnosis not present

## 2019-05-23 DIAGNOSIS — E1165 Type 2 diabetes mellitus with hyperglycemia: Secondary | ICD-10-CM | POA: Diagnosis not present

## 2019-05-23 DIAGNOSIS — E7800 Pure hypercholesterolemia, unspecified: Secondary | ICD-10-CM | POA: Diagnosis not present

## 2019-05-23 DIAGNOSIS — I10 Essential (primary) hypertension: Secondary | ICD-10-CM | POA: Diagnosis not present

## 2019-05-29 ENCOUNTER — Other Ambulatory Visit: Payer: Self-pay

## 2019-05-29 ENCOUNTER — Ambulatory Visit: Payer: Medicare Other

## 2019-05-29 ENCOUNTER — Ambulatory Visit: Payer: Medicare Other | Admitting: Podiatry

## 2019-05-29 ENCOUNTER — Encounter: Payer: Self-pay | Admitting: Podiatry

## 2019-05-29 VITALS — Temp 97.5°F

## 2019-05-29 DIAGNOSIS — R262 Difficulty in walking, not elsewhere classified: Secondary | ICD-10-CM | POA: Diagnosis not present

## 2019-05-29 DIAGNOSIS — S90211A Contusion of right great toe with damage to nail, initial encounter: Secondary | ICD-10-CM | POA: Diagnosis not present

## 2019-05-29 DIAGNOSIS — B351 Tinea unguium: Secondary | ICD-10-CM

## 2019-05-29 DIAGNOSIS — M545 Low back pain: Secondary | ICD-10-CM | POA: Diagnosis not present

## 2019-05-29 DIAGNOSIS — M79676 Pain in unspecified toe(s): Secondary | ICD-10-CM

## 2019-05-29 DIAGNOSIS — M79609 Pain in unspecified limb: Secondary | ICD-10-CM

## 2019-05-29 DIAGNOSIS — M2560 Stiffness of unspecified joint, not elsewhere classified: Secondary | ICD-10-CM | POA: Diagnosis not present

## 2019-05-29 DIAGNOSIS — L600 Ingrowing nail: Secondary | ICD-10-CM | POA: Diagnosis not present

## 2019-05-29 DIAGNOSIS — M5416 Radiculopathy, lumbar region: Secondary | ICD-10-CM | POA: Diagnosis not present

## 2019-05-29 NOTE — Patient Instructions (Signed)

## 2019-05-31 NOTE — Progress Notes (Signed)
Subjective:   Patient ID: Daniel Cooke, male   DOB: 85 y.o.   MRN: [id number]   HPI Patient presents with painful right hallux nail after fall and thickened nailbeds of all toenails that he cannot cut and become painful.  He presents with caregiver today to help him with this condition and is unable to take care of himself.  Patient does not currently smoke and is reasonably in good mental condition   Review of Systems  All other systems reviewed and are negative.       Objective:  Physical Exam Vitals signs and nursing note reviewed.  Constitutional:      Appearance: He is well-developed.  Pulmonary:     Effort: Pulmonary effort is normal.  Musculoskeletal: Normal range of motion.  Skin:    General: Skin is warm.  Neurological:     Mental Status: He is alert.     Neurovascular status found to be intact for age with diminished and reduced pulses but adequate with adequate digital perfusion.  Patient is noted to have damaged right hallux nail that is loose secondary to trauma with no proximal edema erythema or drainage noted and thick yellow brittle nailbeds 1-5 both feet     Assessment:  Traumatized right hallux nail and mycotic nail infection 1-5 both feet with pain     Plan:  H&P discussed both conditions and recommended nail removal and allowing a new nail to regrow due to the trauma along with debridement.  I infiltrated the right hallux 60 mg like Marcaine mixture removed the hallux nail clean the bed out and applied sterile dressing and debrided nailbeds of all nails with no iatrogenic bleeding and return to routine care

## 2019-06-01 DIAGNOSIS — M5416 Radiculopathy, lumbar region: Secondary | ICD-10-CM | POA: Diagnosis not present

## 2019-06-01 DIAGNOSIS — M2560 Stiffness of unspecified joint, not elsewhere classified: Secondary | ICD-10-CM | POA: Diagnosis not present

## 2019-06-01 DIAGNOSIS — M545 Low back pain: Secondary | ICD-10-CM | POA: Diagnosis not present

## 2019-06-01 DIAGNOSIS — R262 Difficulty in walking, not elsewhere classified: Secondary | ICD-10-CM | POA: Diagnosis not present

## 2019-06-07 DIAGNOSIS — M545 Low back pain: Secondary | ICD-10-CM | POA: Diagnosis not present

## 2019-06-07 DIAGNOSIS — R262 Difficulty in walking, not elsewhere classified: Secondary | ICD-10-CM | POA: Diagnosis not present

## 2019-06-07 DIAGNOSIS — M2560 Stiffness of unspecified joint, not elsewhere classified: Secondary | ICD-10-CM | POA: Diagnosis not present

## 2019-06-07 DIAGNOSIS — M5416 Radiculopathy, lumbar region: Secondary | ICD-10-CM | POA: Diagnosis not present

## 2019-06-14 ENCOUNTER — Telehealth: Payer: Self-pay | Admitting: *Deleted

## 2019-06-14 DIAGNOSIS — R030 Elevated blood-pressure reading, without diagnosis of hypertension: Secondary | ICD-10-CM | POA: Diagnosis not present

## 2019-06-14 DIAGNOSIS — M545 Low back pain: Secondary | ICD-10-CM | POA: Diagnosis not present

## 2019-06-14 DIAGNOSIS — M5126 Other intervertebral disc displacement, lumbar region: Secondary | ICD-10-CM | POA: Diagnosis not present

## 2019-06-14 DIAGNOSIS — Z6828 Body mass index (BMI) 28.0-28.9, adult: Secondary | ICD-10-CM | POA: Diagnosis not present

## 2019-06-14 NOTE — Telephone Encounter (Signed)
   [facility] Pre-operative Risk Assessment    Request for surgical clearance:  1. What type of surgery is being performed? TFESI RL3-L4  2. When is this surgery scheduled? 07/24/2019  3. What type of clearance is required (medical clearance vs. Pharmacy clearance to hold med vs. Both)? BOTH  4. Are there any medications that need to be held prior to surgery and how long?Eliquis for 3 days prior   5. Practice name and name of physician performing surgery? [place], [place]  6. What is your office phone [phone]     7.   What is your office fax [phone]  8.   Anesthesia type (None, local, MAC, general) ? None listed   Juventino Slovak 06/14/2019, 3:10 PM  _________________________________________________________________   (provider comments below)

## 2019-06-14 NOTE — Telephone Encounter (Signed)
Patient has surgery scheduled on 07/24/19.

## 2019-06-14 NOTE — Telephone Encounter (Signed)
Error

## 2019-06-14 NOTE — Telephone Encounter (Signed)
Please contact Dr Maryjean Ka office for a formal pre op request describing the procedure to be done.  Kerin Ransom PA-C 06/14/2019 2:41 PM

## 2019-06-15 ENCOUNTER — Ambulatory Visit (INDEPENDENT_AMBULATORY_CARE_PROVIDER_SITE_OTHER): Payer: Medicare Other

## 2019-06-15 ENCOUNTER — Other Ambulatory Visit: Payer: Self-pay

## 2019-06-15 ENCOUNTER — Encounter: Payer: Self-pay | Admitting: Podiatry

## 2019-06-15 ENCOUNTER — Other Ambulatory Visit: Payer: Self-pay | Admitting: Podiatry

## 2019-06-15 ENCOUNTER — Ambulatory Visit: Payer: Medicare Other | Admitting: Podiatry

## 2019-06-15 VITALS — Temp 97.3°F

## 2019-06-15 DIAGNOSIS — M79674 Pain in right toe(s): Secondary | ICD-10-CM

## 2019-06-15 DIAGNOSIS — M779 Enthesopathy, unspecified: Secondary | ICD-10-CM

## 2019-06-15 DIAGNOSIS — M7751 Other enthesopathy of right foot: Secondary | ICD-10-CM

## 2019-06-15 NOTE — Telephone Encounter (Signed)
   Primary Cardiologist: Dr Curt Bears  Chart reviewed as part of pre-operative protocol coverage. Given past medical history and time since last visit, based on ACC/AHA guidelines, OLUWADAMILARE TOBLER would be at acceptable risk for the planned procedure without further cardiovascular testing.   OK to hold Eliquis 3 days pre op  I will route this recommendation to the requesting party via Epic fax function and remove from pre-op pool.  Please call with questions.  Kerin Ransom, PA-C 06/15/2019, 8:26 AM

## 2019-06-15 NOTE — Progress Notes (Signed)
Subjective:   Patient ID: Daniel Cooke, male   DOB: 85 y.o.   MRN: [id number]   HPI Patient presents stating the nail was doing well that was removed but I have some inflammation of my big toe right and states that is been sore   ROS      Objective:  Physical Exam  Neurovascular status intact negative Homans sign noted with patient's right inner phalangeal joint being inflamed and sore at the nail site being crusted over healing well     Assessment:  Possibility for inflammatory capsulitis with arthritis versus any kind of infective process     Plan:  H&P x-ray reviewed condition discussed.  I did discuss the possibility from his previous nail infection that he could have possibly developed some kind of a bone infection but I am hoping that this is inflammatory.  Today I did do a sterile prep and I injected the inner phalangeal joint with 3 mg Dexasone Kenalog 5 mg Xylocaine to see if it reduced inflammation and patient will be seen back for [place] to recheck again depending on symptoms and may require antibiotics and ultimately may require amputation of the hallux  X-rays indicate quite a bit of arthritis at the inner phalangeal joint right big toe

## 2019-06-16 DIAGNOSIS — M545 Low back pain: Secondary | ICD-10-CM | POA: Diagnosis not present

## 2019-06-16 DIAGNOSIS — M2560 Stiffness of unspecified joint, not elsewhere classified: Secondary | ICD-10-CM | POA: Diagnosis not present

## 2019-06-16 DIAGNOSIS — M5416 Radiculopathy, lumbar region: Secondary | ICD-10-CM | POA: Diagnosis not present

## 2019-06-16 DIAGNOSIS — R262 Difficulty in walking, not elsewhere classified: Secondary | ICD-10-CM | POA: Diagnosis not present

## 2019-06-21 DIAGNOSIS — M2560 Stiffness of unspecified joint, not elsewhere classified: Secondary | ICD-10-CM | POA: Diagnosis not present

## 2019-06-21 DIAGNOSIS — M545 Low back pain: Secondary | ICD-10-CM | POA: Diagnosis not present

## 2019-06-21 DIAGNOSIS — R262 Difficulty in walking, not elsewhere classified: Secondary | ICD-10-CM | POA: Diagnosis not present

## 2019-06-21 DIAGNOSIS — M5416 Radiculopathy, lumbar region: Secondary | ICD-10-CM | POA: Diagnosis not present

## 2019-06-22 DIAGNOSIS — S92404D Nondisplaced unspecified fracture of right great toe, subsequent encounter for fracture with routine healing: Secondary | ICD-10-CM | POA: Diagnosis not present

## 2019-06-28 ENCOUNTER — Telehealth: Payer: Self-pay

## 2019-06-28 NOTE — Telephone Encounter (Signed)
   Primary Cardiologist: Will Meredith Leeds, MD  Chart reviewed as part of pre-operative protocol coverage. Given past medical history and time since last visit, based on ACC/AHA guidelines, Daniel Cooke would be at acceptable risk for the planned procedure without further cardiovascular testing.   Pt may hold Eliquis for 3 days prior to procedure.    I will route this recommendation to the requesting party via Epic fax function and remove from pre-op pool.    Please call with questions.  Cecilie Kicks, NP 06/28/2019, 4:56 PM

## 2019-06-28 NOTE — Telephone Encounter (Signed)
Pharm please address eliquis thanks 

## 2019-06-28 NOTE — Telephone Encounter (Signed)
   [facility] Pre-operative Risk Assessment    Request for surgical clearance:  1. What type of surgery is being performed? TFESI R L3-L4   2. When is this surgery scheduled? 07/24/19   3. What type of clearance is required (medical clearance vs. Pharmacy clearance to hold med vs. Both)? PHARMACY CLEARANCE    4. Are there any medications that need to be held prior to surgery and how long? ELIQUIS X 3 DAY   5. Practice name and name of physician performing surgery? [facility]; DR. HARKINS   6. What is your office phone number [phone]    7.   What is your office fax number [phone]  8.   Anesthesia type (None, local, MAC, general) ? NONE LISTED   Jacinta Shoe 06/28/2019, 2:41 PM  _________________________________________________________________   (provider comments below)

## 2019-06-28 NOTE — Telephone Encounter (Signed)
Patient with diagnosis of afib on Eliquis for anticoagulation.    Procedure: TFESI R L3-L4 Date of procedure: 07/24/2019  CHADS2-VASc score of  5 (CHF, HTN, AGE, DM2, AGE)  Per office protocol, patient can hold Eliquis for 3 days prior to procedure.

## 2019-06-29 DIAGNOSIS — R262 Difficulty in walking, not elsewhere classified: Secondary | ICD-10-CM | POA: Diagnosis not present

## 2019-06-29 DIAGNOSIS — M545 Low back pain: Secondary | ICD-10-CM | POA: Diagnosis not present

## 2019-06-29 DIAGNOSIS — M2560 Stiffness of unspecified joint, not elsewhere classified: Secondary | ICD-10-CM | POA: Diagnosis not present

## 2019-06-29 DIAGNOSIS — M5416 Radiculopathy, lumbar region: Secondary | ICD-10-CM | POA: Diagnosis not present

## 2019-06-30 ENCOUNTER — Ambulatory Visit: Payer: Medicare Other | Admitting: Podiatry

## 2019-07-06 DIAGNOSIS — M7021 Olecranon bursitis, right elbow: Secondary | ICD-10-CM | POA: Diagnosis not present

## 2019-07-06 DIAGNOSIS — S92404A Nondisplaced unspecified fracture of right great toe, initial encounter for closed fracture: Secondary | ICD-10-CM | POA: Diagnosis not present

## 2019-07-07 DIAGNOSIS — M7021 Olecranon bursitis, right elbow: Secondary | ICD-10-CM | POA: Diagnosis not present

## 2019-07-13 DIAGNOSIS — M2560 Stiffness of unspecified joint, not elsewhere classified: Secondary | ICD-10-CM | POA: Diagnosis not present

## 2019-07-13 DIAGNOSIS — M545 Low back pain: Secondary | ICD-10-CM | POA: Diagnosis not present

## 2019-07-13 DIAGNOSIS — R262 Difficulty in walking, not elsewhere classified: Secondary | ICD-10-CM | POA: Diagnosis not present

## 2019-07-13 DIAGNOSIS — M5416 Radiculopathy, lumbar region: Secondary | ICD-10-CM | POA: Diagnosis not present

## 2019-07-17 ENCOUNTER — Telehealth: Payer: Self-pay

## 2019-07-17 NOTE — Telephone Encounter (Signed)

## 2019-07-18 ENCOUNTER — Ambulatory Visit (INDEPENDENT_AMBULATORY_CARE_PROVIDER_SITE_OTHER): Payer: Medicare Other | Admitting: Cardiology

## 2019-07-18 ENCOUNTER — Other Ambulatory Visit: Payer: Self-pay

## 2019-07-18 ENCOUNTER — Encounter: Payer: Self-pay | Admitting: Cardiology

## 2019-07-18 VITALS — BP 126/72 | HR 72 | Ht 70.0 in | Wt 196.0 lb

## 2019-07-18 DIAGNOSIS — I480 Paroxysmal atrial fibrillation: Secondary | ICD-10-CM | POA: Diagnosis not present

## 2019-07-18 NOTE — Progress Notes (Signed)
Electrophysiology Office Note   Date:  07/18/2019   ID:  Daniel Cooke, DOB 03/23/1924, MRN [id number]  PCP:  London Pepper, MD  Electrophysiologist:  Constance Haw, MD    No chief complaint on file.    History of Present Illness: Daniel Cooke is a 85 y.o. male who presents today for electrophysiology evaluation.   He has a history of hypertension, diabetes, and hyperlipidemia. He was found to be in atrial fibrillation after a fall where he suffered a vertebral fracture.  He was found to have a pinched nerve around that vertebra and is planned to have injections performed. He was admitted for a HF exacerbation with worsening DOE and edema. His weight had increased by 20 lbs. BNP was elevated at 1068. It was thought that his AF was contributing to his HF episode. He was not cardioverted as he was planned to have a second steroid injection in his back in January.  Today, denies symptoms of palpitations, chest pain, shortness of breath, orthopnea, PND, lower extremity edema, claudication, dizziness, presyncope, syncope, bleeding, or neurologic sequela. The patient is tolerating medications without difficulties.    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  Past Medical History:  Diagnosis Date  . Arthritis    "was in left knee before replacement; now have it in my right knee" (11/25/2016)  . BPH (benign prostatic hypertrophy)   . Chronic diastolic CHF (congestive heart failure) ([place])    a. 02/2016 Echo: EF 60-65%, no rwma, triv AI, mild MR, mildly to mod dil LA, mod dil RA, PASP 58mmHg.  Marland Kitchen Dysrhythmia   . Essential hypertension   . Hard of hearing    wears bilateral hearing aids  . Hypercholesterolemia   . Osteoarthritis   . PAF (paroxysmal atrial fibrillation) ([place])    a. CHADS2VASC score of 4 --> coumadin.  . Pilonidal cyst    PAST HX - NO PROBLEM NOW  . Pneumonia    "one time; years ago" (11/25/2016)  . Shingles     "long time ago"  . Synovitis of knee 02/27/2014  . Type II diabetes mellitus (H[facility])    oral meds - no insulin   Past Surgical History:  Procedure Laterality Date  . APPENDECTOMY    . CARDIOVERSION N/A 02/04/2017   Procedure: CARDIOVERSION;  Surgeon: Lelon Perla, MD;  Location: [facility] ENDOSCOPY;  Service: Cardiovascular;  Laterality: N/A;  . CATARACT EXTRACTION W/ INTRAOCULAR LENS IMPLANT Left 10/2016  . CHOLECYSTECTOMY OPEN    . COLONOSCOPY W/ BIOPSIES  08/2005   Archie Endo 04/27/2011  . EYE SURGERY Right    "right" traumatic cataract removed--injury to the eye--states his eyesight is ok in left eye  . JOINT REPLACEMENT    . KNEE ARTHROSCOPY Left 02/28/2014   Procedure: ARTHROSCOPY LEFT KNEE WITH SYNOVECTOMY;  Surgeon: Gearlean Alf, MD;  Location: [facility] ORS;  Service: Orthopedics;  Laterality: Left;  . KYPHOPLASTY  2017  . KYPHOPLASTY N/A 05/28/2017   Procedure: Lumbar Four Kyphoplasty;  Surgeon: Erline Levine, MD;  Location: [facility];  Service: Neurosurgery;  Laterality: N/A;  L4 Kyphoplasty  . SHOULDER OPEN ROTATOR CUFF REPAIR Left   . TONSILLECTOMY    . TOTAL KNEE ARTHROPLASTY  02/08/2012   Procedure: TOTAL KNEE ARTHROPLASTY;  Surgeon: Gearlean Alf, MD;  Location: [facility] ORS;  Service: Orthopedics;  Laterality: Left;  Marland Kitchen VASECTOMY       Current Outpatient Medications  Medication Sig Dispense Refill  . acetaminophen (TYLENOL) 325 MG tablet Take 325 mg by mouth every 6 (six) hours as needed for mild pain.    Marland Kitchen. apixaban (ELIQUIS) 2.5 MG TABS tablet Take 1 tablet (2.5 mg total) by mouth 2 (two) times daily. 180 tablet 0  . cyclobenzaprine (FLEXERIL) 5 MG tablet TAKE 1 TABLET BY MOUTH TWICE DAILY AS NEEDED FOR MUSCLE PAIN SPASM    . finasteride (PROSCAR) 5 MG tablet Take 5 mg by mouth daily.    . furosemide (LASIX) 40 MG tablet TAKE 1/2 (ONE-HALF) TABLET BY MOUTH  DAILY 45 tablet 3  . HUMALOG KWIKPEN 100 UNIT/ML KwikPen INJECT 3 UNITS PRIOR TO DINNER SUBCUTANEOUSLY ONCE A DAY    . Insulin  Glargine (LANTUS SOLOSTAR) 100 UNIT/ML Solostar Pen Inject 20 Units into the skin at bedtime.     . Insulin Pen Needle (BD PEN NEEDLE NANO U/F) 32G X 4 MM MISC USE AS DIRECTED ONCE DAILY FOR 30 DAYS    . neomycin-polymyxin b-dexamethasone (MAXITROL) 3.5-10000-0.1 SUSP INSTILL 2 DROPS INTO EACH EYE 4 TIMES DAILY    . ONETOUCH VERIO test strip USE TO TEST YOUR BLOOD SUGAR THREE TIMES DAILY.    Marland Kitchen. oxybutynin (DITROPAN) 5 MG tablet oxybutynin chloride 5 mg tablet    . potassium chloride SA (K-DUR,KLOR-CON) 20 MEQ tablet Take 1 tablet (20 mEq total) by mouth daily. 30 tablet 2  . simvastatin (ZOCOR) 20 MG tablet Take 20 mg by mouth at bedtime.    Marland Kitchen. terazosin (HYTRIN) 5 MG capsule Take 5 mg by mouth at bedtime.    . traMADol (ULTRAM) 50 MG tablet Take 50 mg by mouth at bedtime as needed for pain.  0   No current facility-administered medications for this visit.     Allergies:   Codeine, Oxycodone, and Other   Social History:  The patient  reports that he has never smoked. He has never used smokeless tobacco. He reports that he does not drink alcohol or use drugs.   Family History:  The patient's family history includes Heart attack in his brother; Heart disease in his brother and mother.    ROS:  Please see the history of present illness.   Otherwise, review of systems is positive for none.   All other systems are reviewed and negative.   PHYSICAL EXAM: VS:  BP 126/72   Pulse 72   Ht 5\' 10"  (1.778 m)   Wt 196 lb (88.9 kg)   BMI 28.12 kg/m  , BMI Body mass index is 28.12 kg/m. GEN: Well nourished, well developed, in no acute distress  HEENT: normal  Neck: no JVD, carotid bruits, or masses Cardiac: RRR; no murmurs, rubs, or gallops,no edema  Respiratory:  clear to auscultation bilaterally, normal work of breathing GI: soft, nontender, nondistended, + BS MS: no deformity or atrophy  Skin: warm and dry Neuro:  Strength and sensation are intact Psych: euthymic mood, full affect  EKG:   EKG is ordered today. Personal review of the ekg ordered shows sinus rhythm, rate 72, poor R wave progression  Recent Labs: 05/10/2019: BUN 37; Creatinine, Ser 1.65; Hemoglobin 11.7; Platelets 195; Potassium 4.8; Sodium 142    Lipid Panel  No results found for: CHOL, TRIG, HDL, CHOLHDL, VLDL, LDLCALC, LDLDIRECT   Wt Readings from Last 3 Encounters:  07/18/19 196 lb (88.9 kg)  07/06/18 192 lb 9.6 oz (87.4 kg)  12/29/17 191 lb (86.6 kg)      Other studies Reviewed: Additional studies/ records  that were reviewed today include: 12/2010 MPI  Review of the above records today demonstrates:  ECG positive for ischemia, myoview with normal perfusion  11/26/16 TTE - Left ventricle: The cavity size was normal. Wall thickness was   increased in a pattern of moderate LVH. Systolic function was   normal. The estimated ejection fraction was in the range of 60%   to 65%. Wall motion was normal; there were no regional wall   motion abnormalities. - Mitral valve: There was moderate regurgitation. - Left atrium: The atrium was moderately dilated. - Right ventricle: The cavity size was mildly dilated. Wall   thickness was normal. - Right atrium: The atrium was severely dilated. - Tricuspid valve: There was moderate regurgitation. - Pulmonary arteries: Systolic pressure was moderately increased.   PA peak pressure: 65 mm Hg (S).  ASSESSMENT AND PLAN:  1.  Persistent Atrial fibrillation: On Coumadin.   No further episodes of atrial fibrillation.  No changes.  This patients CHA2DS2-VASc Score and unadjusted Ischemic Stroke Rate (% per year) is equal to 4.8 % stroke rate/year from a score of 4  Above score calculated as 1 point each if present [CHF, HTN, DM, Vascular=MI/PAD/Aortic Plaque, Age if 65-74, or Male] Above score calculated as 2 points each if present [Age > 75, or Stroke/TIA/TE]    2. Diastolic heart failure: No signs of volume overload.  3. Hyperlipidemia: Continue statin    Current medicines are reviewed at length with the patient today.   The patient does not have concerns regarding his medicines.  The following changes were made today: None  Labs/ tests ordered today include:  Orders Placed This Encounter  Procedures  . EKG 12-Lead     Disposition:   FU with Quincy Boy 6 months  Signed, Caleesi Kohl Jorja LoaMartin Allahna Husband, MD  07/18/2019 10:48 AM     [facility] [address] [place] [address] [phone] (office) [phone] (fax)

## 2019-07-18 NOTE — Patient Instructions (Signed)
Medication Instructions:  Your physician recommends that you continue on your current medications as directed. Please refer to the Current Medication list given to you today.  If you need a refill on your cardiac medications before your next appointment, please call your pharmacy.   Labwork: None ordered  Testing/Procedures: None ordered  Follow-Up: Your physician wants you to follow-up in: 6 months with Dr. Camnitz.  You will receive a reminder letter in the mail two months in advance. If you don't receive a letter, please call our office to schedule the follow-up appointment.  Thank you for choosing [facility]!!   Victorya Hillman, RN [phone]         

## 2019-07-24 DIAGNOSIS — M545 Low back pain: Secondary | ICD-10-CM | POA: Diagnosis not present

## 2019-07-25 DIAGNOSIS — M25561 Pain in right knee: Secondary | ICD-10-CM | POA: Diagnosis not present

## 2019-07-25 DIAGNOSIS — M1711 Unilateral primary osteoarthritis, right knee: Secondary | ICD-10-CM | POA: Diagnosis not present

## 2019-08-01 DIAGNOSIS — M1711 Unilateral primary osteoarthritis, right knee: Secondary | ICD-10-CM | POA: Diagnosis not present

## 2019-08-06 ENCOUNTER — Other Ambulatory Visit: Payer: Self-pay | Admitting: Cardiology

## 2019-08-08 DIAGNOSIS — M25561 Pain in right knee: Secondary | ICD-10-CM | POA: Diagnosis not present

## 2019-08-08 DIAGNOSIS — S335XXA Sprain of ligaments of lumbar spine, initial encounter: Secondary | ICD-10-CM | POA: Diagnosis not present

## 2019-08-08 DIAGNOSIS — M1711 Unilateral primary osteoarthritis, right knee: Secondary | ICD-10-CM | POA: Diagnosis not present

## 2019-08-28 ENCOUNTER — Telehealth: Payer: Self-pay

## 2019-08-28 DIAGNOSIS — M47816 Spondylosis without myelopathy or radiculopathy, lumbar region: Secondary | ICD-10-CM | POA: Diagnosis not present

## 2019-08-28 DIAGNOSIS — I10 Essential (primary) hypertension: Secondary | ICD-10-CM | POA: Diagnosis not present

## 2019-08-28 DIAGNOSIS — Z6828 Body mass index (BMI) 28.0-28.9, adult: Secondary | ICD-10-CM | POA: Diagnosis not present

## 2019-08-28 NOTE — Telephone Encounter (Signed)
   [facility] Pre-operative Risk Assessment    Request for surgical clearance:  1. What type of surgery is being performed? Lumbar spine B/L L3-L5   2. When is this surgery scheduled? 09/05/19   3. What type of clearance is required (medical clearance vs. Pharmacy clearance to hold med vs. Both)? Pharmacy  4. Are there any medications that need to be held prior to surgery and how long? Eliquis   5. Practice name and name of physician performing surgery? [facility]   6. What is your office phone number [phone]   7.   What is your office fax number [phone]  8.   Anesthesia type (None, local, MAC, general) ? None listed   Mady Haagensen 08/28/2019, 1:33 PM  _________________________________________________________________   (provider comments below)

## 2019-08-28 NOTE — Telephone Encounter (Signed)
Patient with diagnosis of afib on Eliquis for anticoagulation.    Procedure: Lumbar spine B/L L3-L5  Date of procedure: 09/05/2019  CHADS2-VASc score of  5 (CHF, HTN, AGE, DM2, AGE)  CrCl 31 ml/min  Per office protocol, patient can hold Eliquis for 3 days prior to procedure.

## 2019-08-28 NOTE — Telephone Encounter (Signed)
What type of surgery to lumbar spine.?  And anesthesia please clarify. Thanks.

## 2019-08-28 NOTE — Telephone Encounter (Signed)
Pharm please address eliquis with lumbar injection thanks

## 2019-08-28 NOTE — Telephone Encounter (Signed)
Spoke with Ardelle Park, pt is just having an injection to the lumbar spine, l3/4 & L 4/5 With NO anesthesia

## 2019-09-01 DIAGNOSIS — I10 Essential (primary) hypertension: Secondary | ICD-10-CM | POA: Diagnosis not present

## 2019-09-04 DIAGNOSIS — I10 Essential (primary) hypertension: Secondary | ICD-10-CM | POA: Diagnosis not present

## 2019-09-04 DIAGNOSIS — Z0000 Encounter for general adult medical examination without abnormal findings: Secondary | ICD-10-CM | POA: Diagnosis not present

## 2019-09-04 DIAGNOSIS — Z23 Encounter for immunization: Secondary | ICD-10-CM | POA: Diagnosis not present

## 2019-09-04 DIAGNOSIS — N183 Chronic kidney disease, stage 3 (moderate): Secondary | ICD-10-CM | POA: Diagnosis not present

## 2019-09-05 DIAGNOSIS — M47816 Spondylosis without myelopathy or radiculopathy, lumbar region: Secondary | ICD-10-CM | POA: Diagnosis not present

## 2019-09-19 DIAGNOSIS — E1165 Type 2 diabetes mellitus with hyperglycemia: Secondary | ICD-10-CM | POA: Diagnosis not present

## 2019-09-22 DIAGNOSIS — E1165 Type 2 diabetes mellitus with hyperglycemia: Secondary | ICD-10-CM | POA: Diagnosis not present

## 2019-09-22 DIAGNOSIS — R296 Repeated falls: Secondary | ICD-10-CM | POA: Diagnosis not present

## 2019-09-22 DIAGNOSIS — I10 Essential (primary) hypertension: Secondary | ICD-10-CM | POA: Diagnosis not present

## 2019-09-22 DIAGNOSIS — I951 Orthostatic hypotension: Secondary | ICD-10-CM | POA: Diagnosis not present

## 2019-09-25 DIAGNOSIS — M47816 Spondylosis without myelopathy or radiculopathy, lumbar region: Secondary | ICD-10-CM | POA: Diagnosis not present

## 2019-09-27 ENCOUNTER — Other Ambulatory Visit: Payer: Self-pay

## 2019-09-27 ENCOUNTER — Encounter (INDEPENDENT_AMBULATORY_CARE_PROVIDER_SITE_OTHER): Payer: Medicare Other | Admitting: Ophthalmology

## 2019-09-27 DIAGNOSIS — H43813 Vitreous degeneration, bilateral: Secondary | ICD-10-CM | POA: Diagnosis not present

## 2019-09-27 DIAGNOSIS — I10 Essential (primary) hypertension: Secondary | ICD-10-CM

## 2019-09-27 DIAGNOSIS — H4423 Degenerative myopia, bilateral: Secondary | ICD-10-CM

## 2019-09-27 DIAGNOSIS — H35033 Hypertensive retinopathy, bilateral: Secondary | ICD-10-CM

## 2019-10-09 DIAGNOSIS — R3911 Hesitancy of micturition: Secondary | ICD-10-CM | POA: Diagnosis not present

## 2019-10-09 DIAGNOSIS — R351 Nocturia: Secondary | ICD-10-CM | POA: Diagnosis not present

## 2019-10-09 DIAGNOSIS — R3912 Poor urinary stream: Secondary | ICD-10-CM | POA: Diagnosis not present

## 2019-10-09 DIAGNOSIS — N401 Enlarged prostate with lower urinary tract symptoms: Secondary | ICD-10-CM | POA: Diagnosis not present

## 2019-10-10 DIAGNOSIS — M47816 Spondylosis without myelopathy or radiculopathy, lumbar region: Secondary | ICD-10-CM | POA: Diagnosis not present

## 2019-10-11 ENCOUNTER — Other Ambulatory Visit: Payer: Self-pay | Admitting: Pharmacist

## 2019-10-11 DIAGNOSIS — R3911 Hesitancy of micturition: Secondary | ICD-10-CM | POA: Diagnosis not present

## 2019-10-11 DIAGNOSIS — N401 Enlarged prostate with lower urinary tract symptoms: Secondary | ICD-10-CM | POA: Diagnosis not present

## 2019-10-11 DIAGNOSIS — R3916 Straining to void: Secondary | ICD-10-CM | POA: Diagnosis not present

## 2019-10-11 DIAGNOSIS — R351 Nocturia: Secondary | ICD-10-CM | POA: Diagnosis not present

## 2019-10-11 MED ORDER — APIXABAN 2.5 MG PO TABS: 2.5000 mg | ORAL_TABLET | Freq: Two times a day (BID) | ORAL | 1 refills | Status: DC

## 2019-10-12 ENCOUNTER — Telehealth: Payer: Self-pay | Admitting: Cardiology

## 2019-10-12 NOTE — Telephone Encounter (Signed)
Called the requesting office and left a message for someone to call me back.

## 2019-10-12 NOTE — Telephone Encounter (Signed)
Please verify if any other procedure is being performed other than Rezum vapor therapy for BPH.     Clinical pharmacist to review if Eliquis need to be held.   The following excerpt came from [employer] website:  Q. CAN PATIENTS ON ANTICOAGULANT MEDICATION BE TREATED WITH REZ?M?  A. During the Rez?m Pivotal Study, antiplatelet or anticoagulant medication had a 10 day pre- and post-procedure washout. There has been no data collected regarding this subset of patients and therefore, we recommend you follow your standard of care for minimally invasive procedures.  Dr. Barrie Folk standard of care for treating patients on anti-coagulants varied depending on the medication:  Plavix - 7 day pre procedure washout Warfarin - 3 day pre procedure washout All patients were able to resume their anti-coagulants immediately post procedure.

## 2019-10-12 NOTE — Telephone Encounter (Signed)
New message      [facility] Pre-operative Risk Assessment    Request for surgical clearance:  1. What type of surgery is being performed? Ruzum  2. When is this surgery scheduled?12/11/2019  3. What type of clearance is required (medical clearance vs. Pharmacy clearance to hold med vs. Both)? Paharmacy  4. Are there any medications that need to be held prior to surgery and how long?Eliquis will leave up to cardiologist to decide how long  5. Practice name and name of physician performing surgery? [facility], Dr. Link Snuffer  6. What is your office phone [phone] ext [phone]   7.   What is your office fax number [phone]  8.   Anesthesia type (None, local, MAC, general) ? Nitrous    Daniel Cooke 10/12/2019, 1:33 PM  _________________________________________________________________   (provider comments below)

## 2019-10-13 NOTE — Telephone Encounter (Signed)
Called the number listed for patient [name] and his daughter Daniel Cooke answered and stated that I can talk to her. There is not a DPR on file but there is a Permanent Comments that states Patient gives verbal approval for [place] to disclose information to daughter Daniel Cooke [phone] and [phone]. I informed [name] that her father is to hold his Eliquis for 1 day prior to Rezum procedure. She verbalized an understanding and all (if any) questions were answered.

## 2019-10-13 NOTE — Telephone Encounter (Signed)
Patient with diagnosis of afib on Eliquis for anticoagulation.    Procedure: Ruzum Date of procedure: 12/11/2019  CHADS2-VASc score of  5 (CHF, HTN, AGE, DM2, AGE)  CrCl 35 ml/min  Per office protocol, patient can hold Eliquis for 1 day prior to procedure.

## 2019-10-13 NOTE — Telephone Encounter (Signed)
Please inform patient to hold eliquis for 1 day prior to Rezum procedure

## 2019-10-20 ENCOUNTER — Telehealth: Payer: Self-pay | Admitting: Cardiology

## 2019-10-20 NOTE — Telephone Encounter (Signed)
Dtr calls to report pt having ongoing orthostatic issues for past 6 weeks.  Doctors have taken him off prostate meds that could have been causing problem, but it is still persisting.  Recent + orthostatics at PCP and they requested cardiology evaluation. Pt scheduled to see Camnitz next Thursday. Dtr verbalized understanding and agreeable to plan.

## 2019-10-20 NOTE — Telephone Encounter (Signed)
lmtcb Left brief message informing dtr that from message received it sounds as thought pt needs to be evaluated by PCP and that he isn't on any medications, from our standpoint, that would be causing/contributing to reported issues.

## 2019-10-20 NOTE — Telephone Encounter (Signed)
Pt c/o BP issue: STAT if pt c/o blurred vision, one-sided weakness or slurred speech  1. What are your last 5 BP readings? Running around 155/72- it drops when he stand up- it dropped 95/60  2. Are you having any other symptoms (ex. Dizziness, headache, blurred vision, passed out)? Dizzy, sweaty, fallen several times and feels like he is going to pass out  3. What is your BP issue? Blood drops when he stands up- daughter says pt needs to be seen

## 2019-10-23 DIAGNOSIS — M47816 Spondylosis without myelopathy or radiculopathy, lumbar region: Secondary | ICD-10-CM | POA: Diagnosis not present

## 2019-10-26 ENCOUNTER — Other Ambulatory Visit: Payer: Self-pay

## 2019-10-26 ENCOUNTER — Ambulatory Visit: Payer: Medicare Other | Admitting: Cardiology

## 2019-10-26 ENCOUNTER — Encounter: Payer: Self-pay | Admitting: Cardiology

## 2019-10-26 VITALS — Ht 70.0 in | Wt 180.0 lb

## 2019-10-26 DIAGNOSIS — M47816 Spondylosis without myelopathy or radiculopathy, lumbar region: Secondary | ICD-10-CM | POA: Diagnosis not present

## 2019-10-26 DIAGNOSIS — I4819 Other persistent atrial fibrillation: Secondary | ICD-10-CM | POA: Diagnosis not present

## 2019-10-26 NOTE — Progress Notes (Signed)
Electrophysiology Office Note   Date:  10/26/2019   ID:  ERNST CUMPSTON, DOB [birth year], MRN [id number]  PCP:  London Pepper, MD  Electrophysiologist:  Constance Haw, MD    No chief complaint on file.    History of Present Illness: Daniel Cooke is a 85 y.o. male who presents today for electrophysiology evaluation.   He has a history of hypertension, diabetes, and hyperlipidemia. He was found to be in atrial fibrillation after a fall where he suffered a vertebral fracture.  He was found to have a pinched nerve around that vertebra and is planned to have injections performed. He was admitted for a HF exacerbation with worsening DOE and edema. His weight had increased by 20 lbs. BNP was elevated at 1068. It was thought that his AF was contributing to his HF episode. He was not cardioverted as he was planned to have a second steroid injection in his back in January.  Today, denies symptoms of palpitations, chest pain, shortness of breath, orthopnea, PND, lower extremity edema, claudication, dizziness, presyncope, syncope, bleeding, or neurologic sequela. The patient is tolerating medications without difficulties.  Been having weakness and fatigue for the past few weeks.  He is also had a few falls.  He has been getting dizzy when he stands up.  Unfortunately he is in atrial fibrillation today.  He also had orthostatic vitals done which showed significant orthostasis.  He is currently getting back injections with the last one planned for later today.  He also has a plan for a urologic procedure upcoming.  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  Past Medical History:  Diagnosis Date  . Arthritis    "was in left knee before replacement; now have it in my right knee" (11/25/2016)  . BPH (benign prostatic hypertrophy)   . Chronic diastolic CHF (congestive heart failure) ([place])    a. 02/2016 Echo: EF 60-65%, no rwma, triv AI, mild MR,  mildly to mod dil LA, mod dil RA, PASP 6mmHg.  Marland Kitchen Dysrhythmia   . Essential hypertension   . Hard of hearing    wears bilateral hearing aids  . Hypercholesterolemia   . Osteoarthritis   . PAF (paroxysmal atrial fibrillation) ([place])    a. CHADS2VASC score of 4 --> coumadin.  . Pilonidal cyst    PAST HX - NO PROBLEM NOW  . Pneumonia    "one time; years ago" (11/25/2016)  . Shingles    "long time ago"  . Synovitis of knee 02/27/2014  . Type II diabetes mellitus (H[facility])    oral meds - no insulin   Past Surgical History:  Procedure Laterality Date  . APPENDECTOMY    . CARDIOVERSION N/A 02/04/2017   Procedure: CARDIOVERSION;  Surgeon: Lelon Perla, MD;  Location: [facility] ENDOSCOPY;  Service: Cardiovascular;  Laterality: N/A;  . CATARACT EXTRACTION W/ INTRAOCULAR LENS IMPLANT Left 10/2016  . CHOLECYSTECTOMY OPEN    . COLONOSCOPY W/ BIOPSIES  08/2005   Archie Endo 04/27/2011  . EYE SURGERY Right    "right" traumatic cataract removed--injury to the eye--states his eyesight is ok in left eye  . JOINT REPLACEMENT    . KNEE ARTHROSCOPY Left 02/28/2014   Procedure: ARTHROSCOPY LEFT KNEE WITH SYNOVECTOMY;  Surgeon: Gearlean Alf, MD;  Location: [facility] ORS;  Service: Orthopedics;  Laterality: Left;  . KYPHOPLASTY  2017  . KYPHOPLASTY N/A 05/28/2017   Procedure: Lumbar Four Kyphoplasty;  Surgeon: Maeola HarmanStern, Joseph, MD;  [facility];  Service: Neurosurgery;  Laterality: N/A;  L4 Kyphoplasty  . SHOULDER OPEN ROTATOR CUFF REPAIR Left   . TONSILLECTOMY    . TOTAL KNEE ARTHROPLASTY  02/08/2012   Procedure: TOTAL KNEE ARTHROPLASTY;  Surgeon: Loanne DrillingFrank V Aluisio, MD;  Location: [facility] ORS;  Service: Orthopedics;  Laterality: Left;  Marland Kitchen. VASECTOMY       Current Outpatient Medications  Medication Sig Dispense Refill  . acetaminophen (TYLENOL) 325 MG tablet Take 325 mg by mouth every 6 (six) hours as needed for mild pain.    Marland Kitchen. apixaban (ELIQUIS) 2.5 MG TABS tablet Take 1 tablet (2.5 mg total) by mouth 2 (two) times daily.  180 tablet 1  . cyclobenzaprine (FLEXERIL) 5 MG tablet TAKE 1 TABLET BY MOUTH TWICE DAILY AS NEEDED FOR MUSCLE PAIN SPASM    . finasteride (PROSCAR) 5 MG tablet Take 5 mg by mouth daily.    . furosemide (LASIX) 40 MG tablet Take 1/2 (one-half) tablet by mouth once daily 45 tablet 3  . HUMALOG KWIKPEN 100 UNIT/ML KwikPen INJECT 3 UNITS PRIOR TO DINNER SUBCUTANEOUSLY ONCE A DAY    . Insulin Glargine (LANTUS SOLOSTAR) 100 UNIT/ML Solostar Pen Inject 20 Units into the skin at bedtime.     . Insulin Pen Needle (BD PEN NEEDLE NANO U/F) 32G X 4 MM MISC USE AS DIRECTED ONCE DAILY FOR 30 DAYS    . neomycin-polymyxin b-dexamethasone (MAXITROL) 3.5-10000-0.1 SUSP INSTILL 2 DROPS INTO EACH EYE 4 TIMES DAILY    . ONETOUCH VERIO test strip USE TO TEST YOUR BLOOD SUGAR THREE TIMES DAILY.    Marland Kitchen. oxybutynin (DITROPAN) 5 MG tablet oxybutynin chloride 5 mg tablet    . potassium chloride SA (K-DUR,KLOR-CON) 20 MEQ tablet Take 1 tablet (20 mEq total) by mouth daily. 30 tablet 2  . simvastatin (ZOCOR) 20 MG tablet Take 20 mg by mouth at bedtime.    Marland Kitchen. terazosin (HYTRIN) 5 MG capsule Take 5 mg by mouth at bedtime.    . traMADol (ULTRAM) 50 MG tablet Take 50 mg by mouth at bedtime as needed for pain.  0  . tamsulosin (FLOMAX) 0.4 MG CAPS capsule Take 0.4 mg by mouth every morning.     No current facility-administered medications for this visit.     Allergies:   Codeine, Oxycodone, and Other   Social History:  The patient  reports that he has never smoked. He has never used smokeless tobacco. He reports that he does not drink alcohol or use drugs.   Family History:  The patient's family history includes Heart attack in his brother; Heart disease in his brother and mother.    ROS:  Please see the history of present illness.   Otherwise, review of systems is positive for none.   All other systems are reviewed and negative.   PHYSICAL EXAM: VS:  Ht 5\' 10"  (1.778 m)   Wt 180 lb (81.6 kg)   SpO2 98%   BMI 25.83 kg/m   , BMI Body mass index is 25.83 kg/m.    GEN: Well nourished, well developed, in no acute distress  HEENT: normal  Neck: no JVD, carotid bruits, or masses Cardiac: Irregular, tachycardic; no murmurs, rubs, or gallops,no edema  Respiratory:  clear to auscultation bilaterally, normal work of breathing GI: soft, nontender, nondistended, + BS MS: no deformity or atrophy  Skin: warm and dry Neuro:  Strength and sensation are intact Psych: euthymic mood, full affect  EKG:  EKG is  ordered today. Personal review of the ekg ordered shows fibrillation, rate 100  Recent Labs: 05/10/2019: BUN 37; Creatinine, Ser 1.65; Hemoglobin 11.7; Platelets 195; Potassium 4.8; Sodium 142    Lipid Panel  No results found for: CHOL, TRIG, HDL, CHOLHDL, VLDL, LDLCALC, LDLDIRECT   Wt Readings from Last 3 Encounters:  10/26/19 180 lb (81.6 kg)  07/18/19 196 lb (88.9 kg)  07/06/18 192 lb 9.6 oz (87.4 kg)      Other studies Reviewed: Additional studies/ records that were reviewed today include: 12/2010 MPI  Review of the above records today demonstrates:  ECG positive for ischemia, myoview with normal perfusion  11/26/16 TTE - Left ventricle: The cavity size was normal. Wall thickness was   increased in a pattern of moderate LVH. Systolic function was   normal. The estimated ejection fraction was in the range of 60%   to 65%. Wall motion was normal; there were no regional wall   motion abnormalities. - Mitral valve: There was moderate regurgitation. - Left atrium: The atrium was moderately dilated. - Right ventricle: The cavity size was mildly dilated. Wall   thickness was normal. - Right atrium: The atrium was severely dilated. - Tricuspid valve: There was moderate regurgitation. - Pulmonary arteries: Systolic pressure was moderately increased.   PA peak pressure: 65 mm Hg (S).  ASSESSMENT AND PLAN:  1.  Persistent Atrial fibrillation: Currently on Coumadin.  Fortunately he is currently in  atrial fibrillation.  He has been off of his Eliquis due to urologic and back procedures.  I Lakeisha Waldrop discuss with his urologist when he Lyfe Reihl be able to be back on his Eliquis so that we can plan for cardioversion.  I am hesitant to put him on any medications due to the orthostasis.  I think that we do need to get him back into sinus rhythm for improvement in his overall symptoms.  This patients CHA2DS2-VASc Score and unadjusted Ischemic Stroke Rate (% per year) is equal to 3.2 % stroke rate/year from a score of 3  Above score calculated as 1 point each if present [CHF, HTN, DM, Vascular=MI/PAD/Aortic Plaque, Age if 65-74, or Male] Above score calculated as 2 points each if present [Age > 75, or Stroke/TIA/TE]   2. Diastolic heart failure: No signs of volume overload  3. Hyperlipidemia: Continue statin   Current medicines are reviewed at length with the patient today.   The patient does not have concerns regarding his medicines.  The following changes were made today: None  Labs/ tests ordered today include:  Orders Placed This Encounter  Procedures  . EKG 12-Lead     Disposition:   FU with Arelly Whittenberg 3 months  Signed, Zakhai Meisinger Jorja Loa, MD  10/26/2019 11:46 AM     [facility] [address] [place] [address] [phone] (office) [phone] (fax)

## 2019-10-26 NOTE — Patient Instructions (Addendum)
Medication Instructions:  Your physician recommends that you continue on your current medications as directed. Please refer to the Current Medication list given to you today.  * If you need a refill on your cardiac medications before your next appointment, please call your pharmacy.   Labwork: None ordered  Testing/Procedures: None ordered  Follow-Up: The office will contact your daughter to arrange an office next month, after your urology procedure.  Thank you for choosing [facility]!!   Trinidad Curet, RN [phone]  Any Other Special Instructions Will Be Listed Below (If Applicable).  Electrical Cardioversion  Electrical cardioversion is the delivery of a jolt of electricity to restore a normal rhythm to the heart. A rhythm that is too fast or is not regular keeps the heart from pumping well. In this procedure, sticky patches or metal paddles are placed on the chest to deliver electricity to the heart from a device. This procedure may be done in an emergency if:  There is low or no blood pressure as a result of the heart rhythm.  Normal rhythm must be restored as fast as possible to protect the brain and heart from further damage.  It may save a life. This procedure may also be done for irregular or fast heart rhythms that are not immediately life-threatening. Tell a health care provider about:  Any allergies you have.  All medicines you are taking, including vitamins, herbs, eye drops, creams, and over-the-counter medicines.  Any problems you or family members have had with anesthetic medicines.  Any blood disorders you have.  Any surgeries you have had.  Any medical conditions you have.  Whether you are pregnant or may be pregnant. What are the risks? Generally, this is a safe procedure. However, problems may occur, including:  Allergic reactions to medicines.  A blood clot that breaks free and travels to other parts of your body.  The possible return  of an abnormal heart rhythm within hours or days after the procedure.  Your heart stopping (cardiac arrest). This is rare. What happens before the procedure? Medicines  Your health care provider may have you start taking: ? Blood-thinning medicines (anticoagulants) so your blood does not clot as easily. ? Medicines may be given to help stabilize your heart rate and rhythm.  Ask your health care provider about changing or stopping your regular medicines. This is especially important if you are taking diabetes medicines or blood thinners. General instructions  Plan to have someone take you home from the hospital or clinic.  If you will be going home right after the procedure, plan to have someone with you for 24 hours.  Follow instructions from your health care provider about eating or drinking restrictions. What happens during the procedure?  To lower your risk of infection: ? Your health care team will wash or sanitize their hands. ? Your skin will be washed with soap.  An IV tube will be inserted into one of your veins.  You will be given a medicine to help you relax (sedative).  Sticky patches (electrodes) or metal paddles may be placed on your chest.  An electrical shock will be delivered. The procedure may vary among health care providers and hospitals. What happens after the procedure?   Your blood pressure, heart rate, breathing rate, and blood oxygen level will be monitored until the medicines you were given have worn off.  Do not drive for 24 hours if you were given a sedative.  Your heart rhythm will be watched to  make sure it does not change. This information is not intended to replace advice given to you by your health care provider. Make sure you discuss any questions you have with your health care provider. Document Released: 11/20/2002 Document Revised: 11/12/2017 Document Reviewed: 06/05/2016 Elsevier Patient Education  2020 [employer]

## 2019-10-30 DIAGNOSIS — G479 Sleep disorder, unspecified: Secondary | ICD-10-CM | POA: Diagnosis not present

## 2019-10-30 DIAGNOSIS — K2900 Acute gastritis without bleeding: Secondary | ICD-10-CM | POA: Diagnosis not present

## 2019-11-15 ENCOUNTER — Other Ambulatory Visit: Payer: Self-pay

## 2019-11-15 ENCOUNTER — Encounter (HOSPITAL_COMMUNITY): Payer: Self-pay | Admitting: Physician Assistant

## 2019-11-15 ENCOUNTER — Ambulatory Visit (HOSPITAL_COMMUNITY)
Admission: RE | Admit: 2019-11-15 | Discharge: 2019-11-15 | Disposition: A | Discharge status: Home / Self Care | Payer: Medicare Other | Source: Ambulatory Visit | Attending: Physician Assistant | Admitting: Physician Assistant

## 2019-11-15 VITALS — BP 124/50 | HR 80 | Ht 70.0 in | Wt 180.0 lb

## 2019-11-15 DIAGNOSIS — Z79899 Other long term (current) drug therapy: Secondary | ICD-10-CM | POA: Diagnosis not present

## 2019-11-15 DIAGNOSIS — I110 Hypertensive heart disease with heart failure: Secondary | ICD-10-CM | POA: Insufficient documentation

## 2019-11-15 DIAGNOSIS — Z7901 Long term (current) use of anticoagulants: Secondary | ICD-10-CM | POA: Diagnosis not present

## 2019-11-15 DIAGNOSIS — Z9109 Other allergy status, other than to drugs and biological substances: Secondary | ICD-10-CM | POA: Insufficient documentation

## 2019-11-15 DIAGNOSIS — Z9049 Acquired absence of other specified parts of digestive tract: Secondary | ICD-10-CM | POA: Insufficient documentation

## 2019-11-15 DIAGNOSIS — I4819 Other persistent atrial fibrillation: Secondary | ICD-10-CM | POA: Insufficient documentation

## 2019-11-15 DIAGNOSIS — Z8249 Family history of ischemic heart disease and other diseases of the circulatory system: Secondary | ICD-10-CM | POA: Insufficient documentation

## 2019-11-15 DIAGNOSIS — D6869 Other thrombophilia: Secondary | ICD-10-CM | POA: Diagnosis not present

## 2019-11-15 DIAGNOSIS — N400 Enlarged prostate without lower urinary tract symptoms: Secondary | ICD-10-CM | POA: Insufficient documentation

## 2019-11-15 DIAGNOSIS — Z96652 Presence of left artificial knee joint: Secondary | ICD-10-CM | POA: Diagnosis not present

## 2019-11-15 DIAGNOSIS — E7800 Pure hypercholesterolemia, unspecified: Secondary | ICD-10-CM | POA: Diagnosis not present

## 2019-11-15 DIAGNOSIS — M1712 Unilateral primary osteoarthritis, left knee: Secondary | ICD-10-CM | POA: Insufficient documentation

## 2019-11-15 DIAGNOSIS — Z794 Long term (current) use of insulin: Secondary | ICD-10-CM | POA: Insufficient documentation

## 2019-11-15 DIAGNOSIS — Z885 Allergy status to narcotic agent status: Secondary | ICD-10-CM | POA: Diagnosis not present

## 2019-11-15 DIAGNOSIS — I5032 Chronic diastolic (congestive) heart failure: Secondary | ICD-10-CM | POA: Insufficient documentation

## 2019-11-15 DIAGNOSIS — E119 Type 2 diabetes mellitus without complications: Secondary | ICD-10-CM | POA: Insufficient documentation

## 2019-11-15 DIAGNOSIS — I081 Rheumatic disorders of both mitral and tricuspid valves: Secondary | ICD-10-CM | POA: Insufficient documentation

## 2019-11-15 DIAGNOSIS — R9431 Abnormal electrocardiogram [ECG] [EKG]: Secondary | ICD-10-CM | POA: Diagnosis not present

## 2019-11-15 NOTE — Progress Notes (Signed)
Primary Care Physician: London Pepper, MD Primary Electrophysiologist: Dr Curt Bears Referring Physician: Dr Aletha Halim is a 85 y.o. male with a history of hypertension, diabetes, and hyperlipidemia. He was found to be in atrial fibrillation after a fall where he suffered a vertebral fracture.  He was found to have a pinched nerve around that vertebra and is planned to have injections performed. Patient seen by Dr Curt Bears on 10/26/19 found to be in afib with symptoms of dizziness. He had been off anticoagulation 2/2 back injections and a urologic procedure. He has a CHADS2VASC score of 3. Patient is fairly unaware of his arrhythmia. He did have a DCCV in 2018 which was unsuccessful and he was in permanent afib. However, he spontaneously converted to SR in 2019 and had been maintaining SR. Patient is due for Ruzum procedure on 11/20/19.  Today, he denies symptoms of palpitations, chest pain, shortness of breath, orthopnea, PND, lower extremity edema, dizziness, presyncope, syncope, snoring, daytime somnolence, bleeding, or neurologic sequela. The patient is tolerating medications without difficulties and is otherwise without complaint today.    Atrial Fibrillation Risk Factors:  he does not have symptoms or diagnosis of sleep apnea. he does not have a history of rheumatic fever.   he has a BMI of Body mass index is 25.83 kg/m.Marland Kitchen Filed Weights   11/15/19 1439  Weight: 81.6 kg    Family History  Problem Relation Age of Onset  . Heart disease Mother   . Heart disease Brother   . Heart attack Brother      Atrial Fibrillation Management history:  Previous antiarrhythmic drugs: none Previous cardioversions: 2018 Previous ablations: none CHADS2VASC score: 3 Anticoagulation history: warfarin, Eliquis   Past Medical History:  Diagnosis Date  . Arthritis    "was in left knee before replacement; now have it in my right knee" (11/25/2016)  . BPH (benign prostatic  hypertrophy)   . Chronic diastolic CHF (congestive heart failure) ([place])    a. 02/2016 Echo: EF 60-65%, no rwma, triv AI, mild MR, mildly to mod dil LA, mod dil RA, PASP 77mmHg.  Marland Kitchen Dysrhythmia   . Essential hypertension   . Hard of hearing    wears bilateral hearing aids  . Hypercholesterolemia   . Osteoarthritis   . PAF (paroxysmal atrial fibrillation) ([place])    a. CHADS2VASC score of 4 --> coumadin.  . Pilonidal cyst    PAST HX - NO PROBLEM NOW  . Pneumonia    "one time; years ago" (11/25/2016)  . Shingles    "long time ago"  . Synovitis of knee 02/27/2014  . Type II diabetes mellitus (HCC)    oral meds - no insulin   Past Surgical History:  Procedure Laterality Date  . APPENDECTOMY    . CARDIOVERSION N/A 02/04/2017   Procedure: CARDIOVERSION;  Surgeon: Lelon Perla, MD;  Location: [facility] ENDOSCOPY;  Service: Cardiovascular;  Laterality: N/A;  . CATARACT EXTRACTION W/ INTRAOCULAR LENS IMPLANT Left 10/2016  . CHOLECYSTECTOMY OPEN    . COLONOSCOPY W/ BIOPSIES  08/2005   Archie Endo 04/27/2011  . EYE SURGERY Right    "right" traumatic cataract removed--injury to the eye--states his eyesight is ok in left eye  . JOINT REPLACEMENT    . KNEE ARTHROSCOPY Left 02/28/2014   Procedure: ARTHROSCOPY LEFT KNEE WITH SYNOVECTOMY;  Surgeon: Gearlean Alf, MD;  Location: [facility] ORS;  Service: Orthopedics;  Laterality: Left;  . KYPHOPLASTY  2017  . KYPHOPLASTY N/A 05/28/2017   Procedure:  Lumbar Four Kyphoplasty;  Surgeon: Maeola Harman, MD;  Location:  [facility] OR;  Service: Neurosurgery;  Laterality: N/A;  L4 Kyphoplasty  . SHOULDER OPEN ROTATOR CUFF REPAIR Left   . TONSILLECTOMY    . TOTAL KNEE ARTHROPLASTY  02/08/2012   Procedure: TOTAL KNEE ARTHROPLASTY;  Surgeon: [name], MD;  Location: [facility]S;  Service: Orthopedics;  Laterality: Left;  Marland Kitchen VASECTOMY      Current Outpatient Medications  Medication Sig Dispense Refill  . acetaminophen (TYLENOL) 325 MG tablet Take 325 mg by mouth every 6 (six) hours  as needed for mild pain.    Marland Kitchen apixaban (ELIQUIS) 2.5 MG TABS tablet Take 1 tablet (2.5 mg total) by mouth 2 (two) times daily. 180 tablet 1  . finasteride (PROSCAR) 5 MG tablet Take 5 mg by mouth daily.    . furosemide (LASIX) 40 MG tablet Take 1/2 (one-half) tablet by mouth once daily 45 tablet 3  . HUMALOG KWIKPEN 100 UNIT/ML KwikPen INJECT 3 UNITS PRIOR TO DINNER SUBCUTANEOUSLY ONCE A DAY    . Insulin Glargine (LANTUS SOLOSTAR) 100 UNIT/ML Solostar Pen Inject 20 Units into the skin at bedtime.     . Insulin Pen Needle (BD PEN NEEDLE NANO U/F) 32G X 4 MM MISC USE AS DIRECTED ONCE DAILY FOR 30 DAYS    . ONETOUCH VERIO test strip USE TO TEST YOUR BLOOD SUGAR THREE TIMES DAILY.    . simvastatin (ZOCOR) 20 MG tablet Take 20 mg by mouth at bedtime.    Marland Kitchen terazosin (HYTRIN) 5 MG capsule Take 5 mg by mouth at bedtime.     No current facility-administered medications for this encounter.     Allergies  Allergen Reactions  . Codeine Nausea And Vomiting and Other (See Comments)    MAKES ME CRAZY  . Oxycodone Nausea And Vomiting and Other (See Comments)    MAKES ME CRAZY  . Other Other (See Comments)    Social History   Socioeconomic History  . Marital status: Married    Spouse name: Not on file  . Number of children: Not on file  . Years of education: Not on file  . Highest education level: Not on file  Occupational History  . Not on file  Social Needs  . Financial resource strain: Not on file  . Food insecurity    Worry: Not on file    Inability: Not on file  . Transportation needs    Medical: Not on file    Non-medical: Not on file  Tobacco Use  . Smoking status: Never Smoker  . Smokeless tobacco: Never Used  Substance and Sexual Activity  . Alcohol use: No  . Drug use: No  . Sexual activity: Not on file  Lifestyle  . Physical activity    Days per week: Not on file    Minutes per session: Not on file  . Stress: Not on file  Relationships  . Social Musician  on phone: Not on file    Gets together: Not on file    Attends religious service: Not on file    Active member of club or organization: Not on file    Attends meetings of clubs or organizations: Not on file    Relationship status: Not on file  . Intimate partner violence    Fear of current or ex partner: Not on file    Emotionally abused: Not on file    Physically abused: Not on file    Forced sexual  activity: Not on file  Other Topics Concern  . Not on file  Social History Narrative  . Not on file     ROS- All systems are reviewed and negative except as per the HPI above.  Physical Exam: Vitals:   11/15/19 1439  BP: (!) 124/50  Pulse: 80  Weight: 81.6 kg  Height: 5\' 10"  (1.778 m)    GEN- The patient is well appearing elderly male, alert and oriented x 3 today.   Head- normocephalic, atraumatic Eyes-  Sclera clear, conjunctiva pink Ears- hearing intact Oropharynx- clear Neck- supple  Lungs- Clear to ausculation bilaterally, normal work of breathing Heart- irregular rate and rhythm, no murmurs, rubs or gallops  GI- soft, NT, ND, + BS Extremities- no clubbing, cyanosis, or edema MS- no significant deformity or atrophy Skin- no rash or lesion Psych- euthymic mood, full affect Neuro- strength and sensation are intact  Wt Readings from Last 3 Encounters:  11/15/19 81.6 kg  10/26/19 81.6 kg  07/18/19 88.9 kg    EKG today demonstrates afib HR 80, QRS 82, QTc 429  Echo 11/26/16 demonstrated  - Left ventricle: The cavity size was normal. Wall thickness was   increased in a pattern of moderate LVH. Systolic function was   normal. The estimated ejection fraction was in the range of 60%   to 65%. Wall motion was normal; there were no regional wall   motion abnormalities. - Mitral valve: There was moderate regurgitation. - Left atrium: The atrium was moderately dilated. - Right ventricle: The cavity size was mildly dilated. Wall   thickness was normal. - Right atrium:  The atrium was severely dilated. - Tricuspid valve: There was moderate regurgitation. - Pulmonary arteries: Systolic pressure was moderately increased.   PA peak pressure: 65 mm Hg (S).  Epic records are reviewed at length today  Assessment and Plan:  1. Persistent atrial fibrillation Patient remains in rate controlled afib.  We discussed therapeutic options today including DCCV. Will need to wait until 3 weeks of uninterrupted anticoagulation. He will have to hold his Eliquis for his urologic procedure and may have another back injection soon.  Continue with rate control for now. Continue Eliquis 2.5 mg BID  This patients CHA2DS2-VASc Score and unadjusted Ischemic Stroke Rate (% per year) is equal to 3.2 % stroke rate/year from a score of 3  Above score calculated as 1 point each if present [CHF, HTN, DM, Vascular=MI/PAD/Aortic Plaque, Age if 65-74, or Male] Above score calculated as 2 points each if present [Age > 75, or Stroke/TIA/TE]   2. Chronic diastolic CHF No signs or symptoms of fluid overload today.  3. HTN Stable, no changes today.   Follow up in the AF clinic in one month.   Jorja Loaicky Ramey Ketcherside PA-C [facility] [address] [place], [place] [address] [phone] 11/15/2019 4:22 PM

## 2019-11-20 DIAGNOSIS — N401 Enlarged prostate with lower urinary tract symptoms: Secondary | ICD-10-CM | POA: Diagnosis not present

## 2019-11-20 DIAGNOSIS — R3916 Straining to void: Secondary | ICD-10-CM | POA: Diagnosis not present

## 2019-11-20 DIAGNOSIS — R3911 Hesitancy of micturition: Secondary | ICD-10-CM | POA: Diagnosis not present

## 2019-11-20 DIAGNOSIS — R351 Nocturia: Secondary | ICD-10-CM | POA: Diagnosis not present

## 2019-11-21 DIAGNOSIS — I10 Essential (primary) hypertension: Secondary | ICD-10-CM | POA: Diagnosis not present

## 2019-11-21 DIAGNOSIS — S22080A Wedge compression fracture of T11-T12 vertebra, initial encounter for closed fracture: Secondary | ICD-10-CM | POA: Diagnosis not present

## 2019-11-21 DIAGNOSIS — M47816 Spondylosis without myelopathy or radiculopathy, lumbar region: Secondary | ICD-10-CM | POA: Diagnosis not present

## 2019-11-21 DIAGNOSIS — M5489 Other dorsalgia: Secondary | ICD-10-CM | POA: Diagnosis not present

## 2019-11-24 ENCOUNTER — Other Ambulatory Visit: Payer: Self-pay

## 2019-11-24 ENCOUNTER — Inpatient Hospital Stay (HOSPITAL_COMMUNITY)
Admission: EM | Admit: 2019-11-24 | Discharge: 2019-11-27 | DRG: 683 | Disposition: A | Discharge status: Home Health | Payer: Medicare Other | Attending: Internal Medicine | Admitting: Internal Medicine

## 2019-11-24 ENCOUNTER — Emergency Department (HOSPITAL_COMMUNITY): Payer: Medicare Other

## 2019-11-24 ENCOUNTER — Inpatient Hospital Stay (HOSPITAL_COMMUNITY): Payer: Medicare Other

## 2019-11-24 DIAGNOSIS — N390 Urinary tract infection, site not specified: Secondary | ICD-10-CM

## 2019-11-24 DIAGNOSIS — I4819 Other persistent atrial fibrillation: Secondary | ICD-10-CM | POA: Diagnosis not present

## 2019-11-24 DIAGNOSIS — N1832 Chronic kidney disease, stage 3b: Secondary | ICD-10-CM | POA: Diagnosis present

## 2019-11-24 DIAGNOSIS — E11649 Type 2 diabetes mellitus with hypoglycemia without coma: Secondary | ICD-10-CM | POA: Diagnosis not present

## 2019-11-24 DIAGNOSIS — N189 Chronic kidney disease, unspecified: Secondary | ICD-10-CM

## 2019-11-24 DIAGNOSIS — Z974 Presence of external hearing-aid: Secondary | ICD-10-CM

## 2019-11-24 DIAGNOSIS — E1169 Type 2 diabetes mellitus with other specified complication: Secondary | ICD-10-CM | POA: Diagnosis not present

## 2019-11-24 DIAGNOSIS — E876 Hypokalemia: Secondary | ICD-10-CM | POA: Diagnosis not present

## 2019-11-24 DIAGNOSIS — N400 Enlarged prostate without lower urinary tract symptoms: Secondary | ICD-10-CM | POA: Diagnosis not present

## 2019-11-24 DIAGNOSIS — Z8619 Personal history of other infectious and parasitic diseases: Secondary | ICD-10-CM

## 2019-11-24 DIAGNOSIS — E860 Dehydration: Secondary | ICD-10-CM

## 2019-11-24 DIAGNOSIS — I130 Hypertensive heart and chronic kidney disease with heart failure and stage 1 through stage 4 chronic kidney disease, or unspecified chronic kidney disease: Secondary | ICD-10-CM | POA: Diagnosis not present

## 2019-11-24 DIAGNOSIS — H9190 Unspecified hearing loss, unspecified ear: Secondary | ICD-10-CM

## 2019-11-24 DIAGNOSIS — M4856XA Collapsed vertebra, not elsewhere classified, lumbar region, initial encounter for fracture: Secondary | ICD-10-CM | POA: Diagnosis present

## 2019-11-24 DIAGNOSIS — N281 Cyst of kidney, acquired: Secondary | ICD-10-CM | POA: Diagnosis present

## 2019-11-24 DIAGNOSIS — Z885 Allergy status to narcotic agent status: Secondary | ICD-10-CM

## 2019-11-24 DIAGNOSIS — E109 Type 1 diabetes mellitus without complications: Secondary | ICD-10-CM | POA: Diagnosis not present

## 2019-11-24 DIAGNOSIS — S22089A Unspecified fracture of T11-T12 vertebra, initial encounter for closed fracture: Secondary | ICD-10-CM | POA: Diagnosis not present

## 2019-11-24 DIAGNOSIS — Z961 Presence of intraocular lens: Secondary | ICD-10-CM | POA: Diagnosis present

## 2019-11-24 DIAGNOSIS — D649 Anemia, unspecified: Secondary | ICD-10-CM | POA: Diagnosis present

## 2019-11-24 DIAGNOSIS — M8448XD Pathological fracture, other site, subsequent encounter for fracture with routine healing: Secondary | ICD-10-CM | POA: Diagnosis present

## 2019-11-24 DIAGNOSIS — M549 Dorsalgia, unspecified: Secondary | ICD-10-CM | POA: Diagnosis present

## 2019-11-24 DIAGNOSIS — R531 Weakness: Secondary | ICD-10-CM

## 2019-11-24 DIAGNOSIS — Z9181 History of falling: Secondary | ICD-10-CM

## 2019-11-24 DIAGNOSIS — I10 Essential (primary) hypertension: Secondary | ICD-10-CM | POA: Diagnosis not present

## 2019-11-24 DIAGNOSIS — I5032 Chronic diastolic (congestive) heart failure: Secondary | ICD-10-CM | POA: Diagnosis present

## 2019-11-24 DIAGNOSIS — R7989 Other specified abnormal findings of blood chemistry: Secondary | ICD-10-CM

## 2019-11-24 DIAGNOSIS — H903 Sensorineural hearing loss, bilateral: Secondary | ICD-10-CM | POA: Diagnosis not present

## 2019-11-24 DIAGNOSIS — E1122 Type 2 diabetes mellitus with diabetic chronic kidney disease: Secondary | ICD-10-CM | POA: Diagnosis not present

## 2019-11-24 DIAGNOSIS — S32059A Unspecified fracture of fifth lumbar vertebra, initial encounter for closed fracture: Secondary | ICD-10-CM | POA: Diagnosis not present

## 2019-11-24 DIAGNOSIS — I4891 Unspecified atrial fibrillation: Secondary | ICD-10-CM | POA: Diagnosis not present

## 2019-11-24 DIAGNOSIS — Z96652 Presence of left artificial knee joint: Secondary | ICD-10-CM | POA: Diagnosis present

## 2019-11-24 DIAGNOSIS — E1165 Type 2 diabetes mellitus with hyperglycemia: Secondary | ICD-10-CM | POA: Diagnosis present

## 2019-11-24 DIAGNOSIS — E785 Hyperlipidemia, unspecified: Secondary | ICD-10-CM | POA: Diagnosis present

## 2019-11-24 DIAGNOSIS — M5489 Other dorsalgia: Secondary | ICD-10-CM | POA: Diagnosis not present

## 2019-11-24 DIAGNOSIS — R627 Adult failure to thrive: Secondary | ICD-10-CM | POA: Diagnosis present

## 2019-11-24 DIAGNOSIS — Z7901 Long term (current) use of anticoagulants: Secondary | ICD-10-CM

## 2019-11-24 DIAGNOSIS — N183 Chronic kidney disease, stage 3 unspecified: Secondary | ICD-10-CM | POA: Diagnosis present

## 2019-11-24 DIAGNOSIS — N179 Acute kidney failure, unspecified: Principal | ICD-10-CM

## 2019-11-24 DIAGNOSIS — Z960 Presence of urogenital implants: Secondary | ICD-10-CM | POA: Diagnosis present

## 2019-11-24 DIAGNOSIS — Z9089 Acquired absence of other organs: Secondary | ICD-10-CM

## 2019-11-24 DIAGNOSIS — Z20828 Contact with and (suspected) exposure to other viral communicable diseases: Secondary | ICD-10-CM | POA: Diagnosis not present

## 2019-11-24 DIAGNOSIS — S22020A Wedge compression fracture of second thoracic vertebra, initial encounter for closed fracture: Secondary | ICD-10-CM | POA: Diagnosis not present

## 2019-11-24 DIAGNOSIS — Z9852 Vasectomy status: Secondary | ICD-10-CM

## 2019-11-24 DIAGNOSIS — M4854XA Collapsed vertebra, not elsewhere classified, thoracic region, initial encounter for fracture: Secondary | ICD-10-CM | POA: Diagnosis not present

## 2019-11-24 DIAGNOSIS — I480 Paroxysmal atrial fibrillation: Secondary | ICD-10-CM

## 2019-11-24 DIAGNOSIS — Z794 Long term (current) use of insulin: Secondary | ICD-10-CM | POA: Diagnosis not present

## 2019-11-24 DIAGNOSIS — Z9049 Acquired absence of other specified parts of digestive tract: Secondary | ICD-10-CM

## 2019-11-24 DIAGNOSIS — Z6828 Body mass index (BMI) 28.0-28.9, adult: Secondary | ICD-10-CM

## 2019-11-24 DIAGNOSIS — Z9841 Cataract extraction status, right eye: Secondary | ICD-10-CM

## 2019-11-24 DIAGNOSIS — Z9842 Cataract extraction status, left eye: Secondary | ICD-10-CM

## 2019-11-24 DIAGNOSIS — R0902 Hypoxemia: Secondary | ICD-10-CM | POA: Diagnosis not present

## 2019-11-24 DIAGNOSIS — Z79899 Other long term (current) drug therapy: Secondary | ICD-10-CM

## 2019-11-24 DIAGNOSIS — Z8249 Family history of ischemic heart disease and other diseases of the circulatory system: Secondary | ICD-10-CM

## 2019-11-24 DIAGNOSIS — E119 Type 2 diabetes mellitus without complications: Secondary | ICD-10-CM

## 2019-11-24 DIAGNOSIS — M545 Low back pain: Secondary | ICD-10-CM | POA: Diagnosis not present

## 2019-11-24 LAB — CBC WITH DIFFERENTIAL/PLATELET
Abs Immature Granulocytes: 0.03 10*3/uL (ref 0.00–0.07)
Basophils Absolute: 0 10*3/uL (ref 0.0–0.1)
Basophils Relative: 1 %
Eosinophils Absolute: 0.1 10*3/uL (ref 0.0–0.5)
Eosinophils Relative: 2 %
HCT: 34.4 % — ABNORMAL LOW (ref 39.0–52.0)
Hemoglobin: 11 g/dL — ABNORMAL LOW (ref 13.0–17.0)
Immature Granulocytes: 1 %
Lymphocytes Relative: 15 %
Lymphs Abs: 0.9 10*3/uL (ref 0.7–4.0)
MCH: 31.4 pg (ref 26.0–34.0)
MCHC: 32 g/dL (ref 30.0–36.0)
MCV: 98.3 fL (ref 80.0–100.0)
Monocytes Absolute: 0.5 10*3/uL (ref 0.1–1.0)
Monocytes Relative: 8 %
Neutro Abs: 4.6 10*3/uL (ref 1.7–7.7)
Neutrophils Relative %: 73 %
Platelets: 214 10*3/uL (ref 150–400)
RBC: 3.5 MIL/uL — ABNORMAL LOW (ref 4.22–5.81)
RDW: 13.7 % (ref 11.5–15.5)
WBC: 6.1 10*3/uL (ref 4.0–10.5)
nRBC: 0 % (ref 0.0–0.2)

## 2019-11-24 LAB — PROTIME-INR
INR: 1.1 (ref 0.8–1.2)
Prothrombin Time: 14.1 seconds (ref 11.4–15.2)

## 2019-11-24 LAB — GLUCOSE, CAPILLARY
Glucose-Capillary: 146 mg/dL — ABNORMAL HIGH (ref 70–99)
Glucose-Capillary: 146 mg/dL — ABNORMAL HIGH (ref 70–99)

## 2019-11-24 LAB — URINALYSIS, ROUTINE W REFLEX MICROSCOPIC
Bilirubin Urine: NEGATIVE
Glucose, UA: NEGATIVE mg/dL
Ketones, ur: NEGATIVE mg/dL
Nitrite: NEGATIVE
Protein, ur: NEGATIVE mg/dL
Specific Gravity, Urine: 1.01 (ref 1.005–1.030)
pH: 5 (ref 5.0–8.0)

## 2019-11-24 LAB — COMPREHENSIVE METABOLIC PANEL
ALT: 27 U/L (ref 0–44)
AST: 28 U/L (ref 15–41)
Albumin: 2.6 g/dL — ABNORMAL LOW (ref 3.5–5.0)
Alkaline Phosphatase: 90 U/L (ref 38–126)
Anion gap: 7 (ref 5–15)
BUN: 47 mg/dL — ABNORMAL HIGH (ref 8–23)
CO2: 28 mmol/L (ref 22–32)
Calcium: 9.5 mg/dL (ref 8.9–10.3)
Chloride: 102 mmol/L (ref 98–111)
Creatinine, Ser: 2.47 mg/dL — ABNORMAL HIGH (ref 0.61–1.24)
GFR calc Af Amer: 25 mL/min — ABNORMAL LOW (ref >60)
GFR calc non Af Amer: 22 mL/min — ABNORMAL LOW (ref >60)
Glucose, Bld: 212 mg/dL — ABNORMAL HIGH (ref 70–99)
Potassium: 3.8 mmol/L (ref 3.5–5.1)
Sodium: 137 mmol/L (ref 135–145)
Total Bilirubin: 2 mg/dL — ABNORMAL HIGH (ref 0.3–1.2)
Total Protein: 5.1 g/dL — ABNORMAL LOW (ref 6.5–8.1)

## 2019-11-24 LAB — POC SARS CORONAVIRUS 2 AG -  ED: SARS Coronavirus 2 Ag: NEGATIVE

## 2019-11-24 LAB — SARS CORONAVIRUS 2 (TAT 6-24 HRS): SARS Coronavirus 2: NEGATIVE

## 2019-11-24 LAB — LACTIC ACID, PLASMA
Lactic Acid, Venous: 1.8 mmol/L (ref 0.5–1.9)
Lactic Acid, Venous: 1.7 mmol/L (ref 0.5–1.9)

## 2019-11-24 LAB — HEMOGLOBIN A1C
Hgb A1c MFr Bld: 8.9 % — ABNORMAL HIGH (ref 4.8–5.6)
Mean Plasma Glucose: 208.73 mg/dL

## 2019-11-24 MED ORDER — LACTATED RINGERS IV BOLUS
1000.0000 mL | Freq: Once | INTRAVENOUS | Status: AC
  Administered 2019-11-24: 1000 mL via INTRAVENOUS

## 2019-11-24 MED ORDER — ONDANSETRON HCL 4 MG PO TABS: 4.0000 mg | ORAL_TABLET | Freq: Four times a day (QID) | ORAL | Status: DC | PRN

## 2019-11-24 MED ORDER — SODIUM CHLORIDE 0.9 % IV SOLN
1.0000 g | Freq: Once | INTRAVENOUS | Status: AC
  Administered 2019-11-24: 1 g via INTRAVENOUS
  Filled 2019-11-24: qty 10

## 2019-11-24 MED ORDER — SODIUM CHLORIDE 0.9 % IV SOLN
1.0000 g | INTRAVENOUS | Status: DC
  Administered 2019-11-25 – 2019-11-26 (×2): 1 g via INTRAVENOUS
  Filled 2019-11-24 (×2): qty 10

## 2019-11-24 MED ORDER — INSULIN ASPART 100 UNIT/ML ~~LOC~~ SOLN
0.0000 [IU] | Freq: Three times a day (TID) | SUBCUTANEOUS | Status: DC
  Administered 2019-11-26: 1 [IU] via SUBCUTANEOUS
  Administered 2019-11-26 – 2019-11-27 (×2): 2 [IU] via SUBCUTANEOUS

## 2019-11-24 MED ORDER — ACETAMINOPHEN 325 MG PO TABS
650.0000 mg | ORAL_TABLET | Freq: Four times a day (QID) | ORAL | Status: DC | PRN
  Administered 2019-11-24: 650 mg via ORAL
  Filled 2019-11-24: qty 2

## 2019-11-24 MED ORDER — HEPARIN SODIUM (PORCINE) 5000 UNIT/ML IJ SOLN
5000.0000 [IU] | Freq: Three times a day (TID) | INTRAMUSCULAR | Status: DC
  Administered 2019-11-24 – 2019-11-27 (×8): 5000 [IU] via SUBCUTANEOUS
  Filled 2019-11-24 (×8): qty 1

## 2019-11-24 MED ORDER — SIMVASTATIN 20 MG PO TABS
20.0000 mg | ORAL_TABLET | Freq: Every day | ORAL | Status: DC
  Administered 2019-11-24 – 2019-11-26 (×3): 20 mg via ORAL
  Filled 2019-11-24 (×3): qty 1

## 2019-11-24 MED ORDER — FINASTERIDE 5 MG PO TABS
5.0000 mg | ORAL_TABLET | Freq: Every day | ORAL | Status: DC
  Administered 2019-11-25 – 2019-11-27 (×3): 5 mg via ORAL
  Filled 2019-11-24 (×3): qty 1

## 2019-11-24 MED ORDER — INSULIN GLARGINE 100 UNIT/ML ~~LOC~~ SOLN
20.0000 [IU] | Freq: Every day | SUBCUTANEOUS | Status: DC
  Administered 2019-11-24: 20 [IU] via SUBCUTANEOUS
  Filled 2019-11-24 (×2): qty 0.2

## 2019-11-24 MED ORDER — ONDANSETRON HCL 4 MG/2ML IJ SOLN: 4.0000 mg | Freq: Four times a day (QID) | INTRAMUSCULAR | Status: DC | PRN

## 2019-11-24 MED ORDER — SODIUM CHLORIDE 0.9 % IV SOLN
INTRAVENOUS | Status: DC
  Administered 2019-11-24 – 2019-11-26 (×3): via INTRAVENOUS

## 2019-11-24 MED ORDER — TRAMADOL HCL 50 MG PO TABS
100.0000 mg | ORAL_TABLET | Freq: Four times a day (QID) | ORAL | Status: DC | PRN
  Administered 2019-11-25: 100 mg via ORAL
  Filled 2019-11-24 (×2): qty 2

## 2019-11-24 MED ORDER — ACETAMINOPHEN 650 MG RE SUPP: 650.0000 mg | Freq: Four times a day (QID) | RECTAL | Status: DC | PRN

## 2019-11-24 MED ORDER — TRAZODONE HCL 50 MG PO TABS
50.0000 mg | ORAL_TABLET | Freq: Once | ORAL | Status: AC
  Administered 2019-11-24: 50 mg via ORAL
  Filled 2019-11-24: qty 1

## 2019-11-24 MED ORDER — SODIUM CHLORIDE 0.9% FLUSH
3.0000 mL | Freq: Two times a day (BID) | INTRAVENOUS | Status: DC
  Administered 2019-11-25 – 2019-11-27 (×5): 3 mL via INTRAVENOUS

## 2019-11-24 NOTE — ED Triage Notes (Signed)
Pt brought in by [facility] from home for generalized weakness. Pt used his home BP monitor and was hypotensive. Pt CBG 313. Per family pt has had poor oral intake over the last few days. Pt had a urinary catheter placed on Monday. Per EMS pt has been off of his blood thinners for his procedure on Monday.

## 2019-11-24 NOTE — ED Notes (Signed)
Per lab- able to add on A1c to previous labwork

## 2019-11-24 NOTE — ED Notes (Signed)
ED TO INPATIENT HANDOFF REPORT  ED Nurse Name and Phone #: Liane Tribbey [phone]  S Name/Age/Gender Daniel Cooke 85 y.o. male Room/Bed: 040C/040C  Code Status   Code Status: Full Code  Home/SNF/Other Home Patient oriented to: self, place, time and situation Is this baseline? Yes   Triage Complete: Triage complete  Chief Complaint Acute kidney injury superimposed on chronic kidney disease ([place]) [N17.9, N18.9]  Triage Note Pt brought in by [facility] from home for generalized weakness. Pt used his home BP monitor and was hypotensive. Pt CBG 313. Per family pt has had poor oral intake over the last few days. Pt had a urinary catheter placed on Monday. Per EMS pt has been off of his blood thinners for his procedure on Monday.     Allergies Allergies  Allergen Reactions  . Codeine Nausea And Vomiting and Other (See Comments)    MAKES ME CRAZY  . Oxycodone Nausea And Vomiting and Other (See Comments)    MAKES ME CRAZY  . Other Other (See Comments)    All narcotic pain medicine makes him crazy.     Level of Care/Admitting Diagnosis ED Disposition    ED Disposition Condition [facility] Area: [place] [[id number]]  Level of Care: Telemetry Medical [104]  Covid Evaluation: Asymptomatic Screening Protocol (No Symptoms)  Diagnosis: Acute kidney injury superimposed on chronic kidney disease [facility]) [[id number]]  Admitting Physician: Norval Morton [[id number]]  Attending Physician: Norval Morton [[id number]]  Estimated length of stay: past midnight tomorrow  Certification:: I certify this patient will need inpatient services for at least 2 midnights       B Medical/Surgery History Past Medical History:  Diagnosis Date  . Arthritis    "was in left knee before replacement; now have it in my right knee" (11/25/2016)  . BPH (benign prostatic hypertrophy)   . Chronic diastolic CHF (congestive heart failure) ([place])    a. 02/2016 Echo: EF 60-65%, no rwma, triv  AI, mild MR, mildly to mod dil LA, mod dil RA, PASP 74mHg.  .Marland KitchenDysrhythmia   . Essential hypertension   . Hard of hearing    wears bilateral hearing aids  . Hypercholesterolemia   . Osteoarthritis   . PAF (paroxysmal atrial fibrillation) ([place]    a. CHADS2VASC score of 4 --> coumadin.  . Pilonidal cyst    PAST HX - NO PROBLEM NOW  . Pneumonia    "one time; years ago" (11/25/2016)  . Shingles    "long time ago"  . Synovitis of knee 02/27/2014  . Type II diabetes mellitus (HCC)    oral meds - no insulin   Past Surgical History:  Procedure Laterality Date  . APPENDECTOMY    . CARDIOVERSION N/A 02/04/2017   Procedure: CARDIOVERSION;  Surgeon: BLelon Perla MD;  [facility];  Service: Cardiovascular;  Laterality: N/A;  . CATARACT EXTRACTION W/ INTRAOCULAR LENS IMPLANT Left 10/2016  . CHOLECYSTECTOMY OPEN    . COLONOSCOPY W/ BIOPSIES  08/2005   /Archie Endo5/14/2012  . EYE SURGERY Right    "right" traumatic cataract removed--injury to the eye--states his eyesight is ok in left eye  . JOINT REPLACEMENT    . KNEE ARTHROSCOPY Left 02/28/2014   Procedure: ARTHROSCOPY LEFT KNEE WITH SYNOVECTOMY;  Surgeon: FGearlean Alf MD;  Location[facility];  Service: Orthopedics;  Laterality: Left;  . KYPHOPLASTY  2017  . KYPHOPLASTY N/A 05/28/2017   Procedure: Lumbar Four Kyphoplasty;  Surgeon: SErline Levine MD;  Location: [facility] OR;  Service: Neurosurgery;  Laterality: N/A;  L4 Kyphoplasty  . SHOULDER OPEN ROTATOR CUFF REPAIR Left   . TONSILLECTOMY    . TOTAL KNEE ARTHROPLASTY  02/08/2012   Procedure: TOTAL KNEE ARTHROPLASTY;  Surgeon: Gearlean Alf, MD;  Location: [facility];  Service: Orthopedics;  Laterality: Left;  Marland Kitchen VASECTOMY       A IV Location/Drains/Wounds Patient Lines/Drains/Airways Status   Active Line/Drains/Airways    Name:   Placement date:   Placement time:   Site:   Days:   Peripheral IV 11/24/19 Left Antecubital   11/24/19    1217    Antecubital   less than 1   Incision  02/08/12 Knee Left   02/08/12    0700     2846   Incision (Closed) 02/28/14 Knee Left   02/28/14    0947     2095   Incision (Closed) 05/28/17 Back   05/28/17    1423     910          Intake/Output Last 24 hours  Intake/Output Summary (Last 24 hours) at 11/24/2019 1616 Last data filed at 11/24/2019 1601 Gross per 24 hour  Intake 1100 ml  Output --  Net 1100 ml    Labs/Imaging Results for orders placed or performed during the hospital encounter of 11/24/19 (from the past 48 hour(s))  Comprehensive metabolic panel     Status: Abnormal   Collection Time: 11/24/19  1:00 PM  Result Value Ref Range   Sodium 137 135 - 145 mmol/L   Potassium 3.8 3.5 - 5.1 mmol/L   Chloride 102 98 - 111 mmol/L   CO2 28 22 - 32 mmol/L   Glucose, Bld 212 (H) 70 - 99 mg/dL   BUN 47 (H) 8 - 23 mg/dL   Creatinine, Ser 2.47 (H) 0.61 - 1.24 mg/dL   Calcium 9.5 8.9 - 10.3 mg/dL   Total Protein 5.1 (L) 6.5 - 8.1 g/dL   Albumin 2.6 (L) 3.5 - 5.0 g/dL   AST 28 15 - 41 U/L   ALT 27 0 - 44 U/L   Alkaline Phosphatase 90 38 - 126 U/L   Total Bilirubin 2.0 (H) 0.3 - 1.2 mg/dL   GFR calc non Af Amer 22 (L) >60 mL/min   GFR calc Af Amer 25 (L) >60 mL/min   Anion gap 7 5 - 15    Comment: Performed at [facility], [address] [place], [place] [address]  CBC with Differential     Status: Abnormal   Collection Time: 11/24/19  1:00 PM  Result Value Ref Range   WBC 6.1 4.0 - 10.5 K/uL   RBC 3.50 (L) 4.22 - 5.81 MIL/uL   Hemoglobin 11.0 (L) 13.0 - 17.0 g/dL   HCT 34.4 (L) 39.0 - 52.0 %   MCV 98.3 80.0 - 100.0 fL   MCH 31.4 26.0 - 34.0 pg   MCHC 32.0 30.0 - 36.0 g/dL   RDW 13.7 11.5 - 15.5 %   Platelets 214 150 - 400 K/uL   nRBC 0.0 0.0 - 0.2 %   Neutrophils Relative % 73 %   Neutro Abs 4.6 1.7 - 7.7 K/uL   Lymphocytes Relative 15 %   Lymphs Abs 0.9 0.7 - 4.0 K/uL   Monocytes Relative 8 %   Monocytes Absolute 0.5 0.1 - 1.0 K/uL   Eosinophils Relative 2 %   Eosinophils Absolute 0.1 0.0 - 0.5 K/uL    Basophils Relative 1 %  Basophils Absolute 0.0 0.0 - 0.1 K/uL   Immature Granulocytes 1 %   Abs Immature Granulocytes 0.03 0.00 - 0.07 K/uL    Comment: Performed at [facility], [place] [address] [place], [place] [address]  Lactic acid, plasma     Status: None   Collection Time: 11/24/19  1:00 PM  Result Value Ref Range   Lactic Acid, Venous 1.8 0.5 - 1.9 mmol/L    Comment: Performed at [facility] [address], [place], [place] [address]  Protime-INR     Status: None   Collection Time: 11/24/19  1:00 PM  Result Value Ref Range   Prothrombin Time 14.1 11.4 - 15.2 seconds   INR 1.1 0.8 - 1.2    Comment: (NOTE) INR goal varies based on device and disease states. Performed at [facility], [place] [address] [place], [place] [address]   POC SARS Coronavirus 2 Ag-ED - Nasal Swab (BD Veritor Kit)     Status: None   Collection Time: 11/24/19  1:26 PM  Result Value Ref Range   SARS Coronavirus 2 Ag NEGATIVE NEGATIVE    Comment: (NOTE) SARS-CoV-2 antigen NOT DETECTED.  Negative results are presumptive.  Negative results do not preclude SARS-CoV-2 infection and should not be used as the sole basis for treatment or other patient management decisions, including infection  control decisions, particularly in the presence of clinical signs and  symptoms consistent with COVID-19, or in those who have been in contact with the virus.  Negative results must be combined with clinical observations, patient history, and epidemiological information. The expected result is Negative. Fact Sheet for Patients: PodPark.tn Fact Sheet for Healthcare Providers: GiftContent.is This test is not yet approved or cleared by the [place] FDA and  has been authorized for detection and/or diagnosis of SARS-CoV-2 by FDA under an Emergency Use Authorization (EUA).  This EUA will remain in effect (meaning this test can be used) for  the duration of  the COVID-19 de claration under Section 564(b)(1) of the Act, 21 U.S.C. section 360bbb-3(b)(1), unless the authorization is terminated or revoked sooner.   Urinalysis, Routine w reflex microscopic     Status: Abnormal   Collection Time: 11/24/19  1:55 PM  Result Value Ref Range   Color, Urine YELLOW YELLOW   APPearance CLEAR CLEAR   Specific Gravity, Urine 1.010 1.005 - 1.030   pH 5.0 5.0 - 8.0   Glucose, UA NEGATIVE NEGATIVE mg/dL   Hgb urine dipstick MODERATE (A) NEGATIVE   Bilirubin Urine NEGATIVE NEGATIVE   Ketones, ur NEGATIVE NEGATIVE mg/dL   Protein, ur NEGATIVE NEGATIVE mg/dL   Nitrite NEGATIVE NEGATIVE   Leukocytes,Ua MODERATE (A) NEGATIVE   RBC / HPF 11-20 0 - 5 RBC/hpf   WBC, UA 11-20 0 - 5 WBC/hpf   Bacteria, UA RARE (A) NONE SEEN   Squamous Epithelial / LPF 0-5 0 - 5   Mucus PRESENT    Hyaline Casts, UA PRESENT     Comment: Performed at [facility], [address] [place], [place] [address]   DG Chest Port 1 View  Result Date: 11/24/2019 CLINICAL DATA:  Generalized weakness. EXAM: PORTABLE CHEST 1 VIEW COMPARISON:  November 25, 2016. FINDINGS: The heart size and mediastinal contours are within normal limits. Both lungs are clear. The visualized skeletal structures are unremarkable. IMPRESSION: No active disease. Electronically Signed   By: Marijo Conception M.D.   On: 11/24/2019 13:22    Pending Labs  Unresulted Labs (From admission, onward)    Start     Ordered   11/25/19 0500  CBC  Tomorrow morning,   R     11/24/19 1605   11/25/19 0762  Basic metabolic panel  Tomorrow morning,   R     11/24/19 1605   11/24/19 1614  Hemoglobin A1c  Once,   STAT    Comments: To assess prior glycemic control    11/24/19 1614   11/24/19 1604  Urine culture  Add-on,   AD     11/24/19 1605   11/24/19 1433  SARS CORONAVIRUS 2 (TAT 6-24 HRS) Nasopharyngeal Nasopharyngeal Swab  (Asymptomatic/Tier 3)  Once,   STAT    Question Answer Comment  Is this test for  diagnosis or screening Screening   Symptomatic for COVID-19 as defined by CDC No   Hospitalized for COVID-19 No   Admitted to ICU for COVID-19 No   Previously tested for COVID-19 Yes   Resident in a congregate (group) care setting Unknown   Employed in healthcare setting No      11/24/19 1432   11/24/19 1432  Culture, blood (routine x 2)  BLOOD CULTURE X 2,   STAT     11/24/19 1432   11/24/19 1229  Lactic acid, plasma  Now then every 2 hours,   STAT     11/24/19 1229   11/24/19 1229  Urine culture  ONCE - STAT,   STAT     11/24/19 1229          Vitals/Pain Today's Vitals   11/24/19 1215 11/24/19 1230 11/24/19 1345 11/24/19 1500  BP: (!) 114/59 (!) 100/50 (!) 106/59 (!) 136/55  Pulse: 64 60 60 65  Resp: _0 Temp: 97.7 F (36.5 C)     TempSrc: Oral     SpO2: 99% 97% 100% 98%    Isolation Precautions No active isolations  Medications Medications  simvastatin (ZOCOR) tablet 20 mg (has no administration in time range)  Insulin Glargine (LANTUS) Solostar Pen 20 Units (has no administration in time range)  finasteride (PROSCAR) tablet 5 mg (has no administration in time range)  traMADol (ULTRAM) tablet 100 mg (has no administration in time range)  heparin injection 5,000 Units (has no administration in time range)  sodium chloride flush (NS) 0.9 % injection 3 mL (has no administration in time range)  ondansetron (ZOFRAN) tablet 4 mg (has no administration in time range)    Or  ondansetron (ZOFRAN) injection 4 mg (has no administration in time range)  acetaminophen (TYLENOL) tablet 650 mg (has no administration in time range)    Or  acetaminophen (TYLENOL) suppository 650 mg (has no administration in time range)  cefTRIAXone (ROCEPHIN) 1 g in sodium chloride 0.9 % 100 mL IVPB (has no administration in time range)  insulin aspart (novoLOG) injection 0-6 Units (has no administration in time range)  lactated ringers bolus 1,000 mL (0 mLs Intravenous Stopped 11/24/19  1527)  cefTRIAXone (ROCEPHIN) 1 g in sodium chloride 0.9 % 100 mL IVPB (0 g Intravenous Stopped 11/24/19 1601)    Mobility walks High fall risk      R Recommendations: See Admitting Provider Note  Report given to:   Additional Notes:

## 2019-11-24 NOTE — H&P (Signed)
History and Physical    Daniel Cooke UMP:[id number] DOB: Jul 14, 1924 DOA: 11/24/2019  Referring MD/NP/PA: Theotis Burrow, MD PCP: London Pepper, MD  Patient coming from: Home via EMS  Chief Complaint: Weakness  I have personally briefly reviewed patient's old medical records in [place]   HPI: Daniel Cooke is a 85 y.o. male with medical history significant of hypertension, paroxysmal atrial fibrillation on Eliquis, BPH, chronic diastolic heart failure last EF 60 - 65%, and diabetes mellitus type 2.  Patient presents with complaints of weakness since rezum procedure performed on 12/7.  History is obtained from the patient and additional history obtained from his daughter over the phone.  Patient had been more weak with decreased appetite per family.  They also report recent history of falls within the last 3weeks.  He been complaining of significant mid to lower back pain and with any kind of movement.  Daughter reports he had been seen by Dr.Stern and they had recommended patient have MRI of the back for further evaluation fractures of the T and L-spine.  He has had previous injections without relief of symptoms.  CODE STATUS was discussed with patient and his daughter over the phone would like patient to remain a full code.  ED Course: On admission into the emergency department patient was noted to have vital signs relatively within normal limits.  Labs significant for WBC 6.1, hemoglobin 11, BUN 47, creatinine 2.47, glucose 212, lactic acid 1.8, and INR 1.1.  Chest x-ray was otherwise clear.  Urinalysis was positive for moderate hemoglobin, moderate leukocytes, rare bacteria, and 11-20 WBCs.  Due to the hypotension patient had been started on empiric antibiotics of Rocephin.  Case was discussed with Dr. Alinda Money of urology over the phone who recommended not taking the Foley catheter out.  Patient had been given 1 L lactated Ringer's for soft blood pressures Rocephin IV.  Review of  Systems  Constitutional: Positive for malaise/fatigue. Negative for fever.  HENT: Positive for hearing loss. Negative for nosebleeds.   Eyes: Negative for photophobia and pain.  Respiratory: Negative for cough.   Cardiovascular: Negative for chest pain and leg swelling.  Gastrointestinal: Negative for abdominal pain, nausea and vomiting.  Genitourinary: Negative for hematuria.       Positive for Foley catheter in place.  Musculoskeletal: Positive for back pain and falls.  Neurological: Positive for weakness.    Past Medical History:  Diagnosis Date  . Arthritis    "was in left knee before replacement; now have it in my right knee" (11/25/2016)  . BPH (benign prostatic hypertrophy)   . Chronic diastolic CHF (congestive heart failure) ([place])    a. 02/2016 Echo: EF 60-65%, no rwma, triv AI, mild MR, mildly to mod dil LA, mod dil RA, PASP 52mmHg.  Marland Kitchen Dysrhythmia   . Essential hypertension   . Hard of hearing    wears bilateral hearing aids  . Hypercholesterolemia   . Osteoarthritis   . PAF (paroxysmal atrial fibrillation) ([place])    a. CHADS2VASC score of 4 --> coumadin.  . Pilonidal cyst    PAST HX - NO PROBLEM NOW  . Pneumonia    "one time; years ago" (11/25/2016)  . Shingles    "long time ago"  . Synovitis of knee 02/27/2014  . Type II diabetes mellitus (H[facility])    oral meds - no insulin    Past Surgical History:  Procedure Laterality Date  . APPENDECTOMY    . CARDIOVERSION N/A 02/04/2017   Procedure:  CARDIOVERSION;  Surgeon: [name], MD;  Location: [facility] ENDOSCOPY;  Service: Cardiovascular;  Laterality: N/A;  . CATARACT EXTRACTION W/ INTRAOCULAR LENS IMPLANT Left 10/2016  . CHOLECYSTECTOMY OPEN    . COLONOSCOPY W/ BIOPSIES  08/2005   [name] 04/27/2011  . EYE SURGERY Right    "right" traumatic cataract removed--injury to the eye--states his eyesight is ok in left eye  . JOINT REPLACEMENT    . KNEE ARTHROSCOPY Left 02/28/2014   Procedure: ARTHROSCOPY LEFT KNEE WITH  SYNOVECTOMY;  Surgeon: [name], MD;  Location: [facility] ORS;  Service: Orthopedics;  Laterality: Left;  . KYPHOPLASTY  2017  . KYPHOPLASTY N/A 05/28/2017   Procedure: Lumbar Four Kyphoplasty;  Surgeon: Maeola Harman, MD;  Location: [facility] OR;  Service: Neurosurgery;  Laterality: N/A;  L4 Kyphoplasty  . SHOULDER OPEN ROTATOR CUFF REPAIR Left   . TONSILLECTOMY    . TOTAL KNEE ARTHROPLASTY  02/08/2012   Procedure: TOTAL KNEE ARTHROPLASTY;  Surgeon: [name], MD;  Location: [facility] ORS;  Service: Orthopedics;  Laterality: Left;  Marland Kitchen VASECTOMY       reports that he has never smoked. He has never used smokeless tobacco. He reports that he does not drink alcohol or use drugs.  Allergies  Allergen Reactions  . Codeine Nausea And Vomiting and Other (See Comments)    MAKES ME CRAZY  . Oxycodone Nausea And Vomiting and Other (See Comments)    MAKES ME CRAZY  . Other Other (See Comments)    Family History  Problem Relation Age of Onset  . Heart disease Mother   . Heart disease Brother   . Heart attack Brother     Prior to Admission medications   Medication Sig Start Date End Date Taking? Authorizing Provider  acetaminophen (TYLENOL) 325 MG tablet Take 325 mg by mouth every 6 (six) hours as needed for mild pain.    [provider]  apixaban (ELIQUIS) 2.5 MG TABS tablet Take 1 tablet (2.5 mg total) by mouth 2 (two) times daily. 10/11/19   Camnitz, Will Daphine Deutscher, MD  finasteride (PROSCAR) 5 MG tablet Take 5 mg by mouth daily. 05/01/19   [provider]  furosemide (LASIX) 40 MG tablet Take 1/2 (one-half) tablet by mouth once daily 08/07/19   Camnitz, Andree Coss, MD  HUMALOG KWIKPEN 100 UNIT/ML KwikPen INJECT 3 UNITS PRIOR TO DINNER SUBCUTANEOUSLY ONCE A DAY 12/02/18   [provider]  Insulin Glargine (LANTUS SOLOSTAR) 100 UNIT/ML Solostar Pen Inject 20 Units into the skin at bedtime.     [provider]  Insulin Pen Needle (BD PEN NEEDLE NANO U/F) 32G X 4 MM MISC  USE AS DIRECTED ONCE DAILY FOR 30 DAYS 10/28/18   [provider]  ONETOUCH VERIO test strip USE TO TEST YOUR BLOOD SUGAR THREE TIMES DAILY. 03/15/19   [provider]  simvastatin (ZOCOR) 20 MG tablet Take 20 mg by mouth at bedtime.    [provider]  terazosin (HYTRIN) 5 MG capsule Take 5 mg by mouth at bedtime.    [provider]    Physical Exam:  Constitutional: Elderly male who is in no acute distress at this time. Vitals:   11/24/19 1215 11/24/19 1230 11/24/19 1345  BP: (!) 114/59 (!) 100/50 (!) 106/59  Pulse: 64 60 60  Resp: Temp: 97.7 F (36.5 C)    TempSrc: Oral    SpO2: 99% 97% 100%   Eyes: PERRL, lids and conjunctivae normal ENMT: Mucous membranes  are moist. Posterior pharynx clear of any exudate or lesions.hard of hearing with hearing aids in place. Neck: normal, supple, no masses, no thyromegaly Respiratory: clear to auscultation bilaterally, no wheezing, no crackles. Normal respiratory effort. No accessory muscle use.  Cardiovascular: Irregular irregular, no murmurs / rubs / gallops. No extremity edema. 2+ pedal pulses. No carotid bruits.  Abdomen: no tenderness, no masses palpated. No hepatosplenomegaly. Bowel sounds positive.  Musculoskeletal: no clubbing / cyanosis. No joint deformity upper and lower extremities. Good ROM, no contractures. Normal muscle tone.  Skin: no rashes, lesions, ulcers. No induration Neurologic: CN 2-12 grossly intact. Sensation intact, DTR normal. Strength 5/5 in all 4.  Psychiatric:  Alert and oriented x 3.  Anxious mood.     Labs on Admission: I have personally reviewed following labs and imaging studies  CBC: Recent Labs  Lab 11/24/19 1300  WBC 6.1  NEUTROABS 4.6  HGB 11.0*  HCT 34.4*  MCV 98.3  PLT 214   Basic Metabolic Panel: Recent Labs  Lab 11/24/19 1300  NA 137  K 3.8  CL 102  CO2 28  GLUCOSE 212*  BUN 47*  CREATININE 2.47*  CALCIUM 9.5   GFR: Estimated Creatinine  Clearance: 19.3 mL/min (A) (by C-G formula based on SCr of 2.47 mg/dL (H)). Liver Function Tests: Recent Labs  Lab 11/24/19 1300  AST 28  ALT 27  ALKPHOS 90  BILITOT 2.0*  PROT 5.1*  ALBUMIN 2.6*   No results for input(s): LIPASE, AMYLASE in the last 168 hours. No results for input(s): AMMONIA in the last 168 hours. Coagulation Profile: Recent Labs  Lab 11/24/19 1300  INR 1.1   Cardiac Enzymes: No results for input(s): CKTOTAL, CKMB, CKMBINDEX, TROPONINI in the last 168 hours. BNP (last 3 results) No results for input(s): PROBNP in the last 8760 hours. HbA1C: No results for input(s): HGBA1C in the last 72 hours. CBG: No results for input(s): GLUCAP in the last 168 hours. Lipid Profile: No results for input(s): CHOL, HDL, LDLCALC, TRIG, CHOLHDL, LDLDIRECT in the last 72 hours. Thyroid Function Tests: No results for input(s): TSH, T4TOTAL, FREET4, T3FREE, THYROIDAB in the last 72 hours. Anemia Panel: No results for input(s): VITAMINB12, FOLATE, FERRITIN, TIBC, IRON, RETICCTPCT in the last 72 hours. Urine analysis:    Component Value Date/Time   COLORURINE YELLOW 11/24/2019 1355   APPEARANCEUR CLEAR 11/24/2019 1355   LABSPEC 1.010 11/24/2019 1355   PHURINE 5.0 11/24/2019 1355   GLUCOSEU NEGATIVE 11/24/2019 1355   HGBUR MODERATE (A) 11/24/2019 1355   BILIRUBINUR NEGATIVE 11/24/2019 1355   KETONESUR NEGATIVE 11/24/2019 1355   PROTEINUR NEGATIVE 11/24/2019 1355   UROBILINOGEN 0.2 01/28/2012 1143   NITRITE NEGATIVE 11/24/2019 1355   LEUKOCYTESUR MODERATE (A) 11/24/2019 1355   Sepsis Labs: No results found for this or any previous visit (from the past 240 hour(s)).   Radiological Exams on Admission: DG Chest Port 1 View  Result Date: 11/24/2019 CLINICAL DATA:  Generalized weakness. EXAM: PORTABLE CHEST 1 VIEW COMPARISON:  November 25, 2016. FINDINGS: The heart size and mediastinal contours are within normal limits. Both lungs are clear. The visualized skeletal  structures are unremarkable. IMPRESSION: No active disease. Electronically Signed   By: Lupita Raider M.D.   On: 11/24/2019 13:22    EKG: Independently reviewed.  Atrial fibrillation at 64 bpm with PVCs  Assessment/Plan Acute kidney injury superimposed on chronic kidney disease stage: Patient presents with creatinine elevated up to 2.47 with BUN 47.  Baseline creatinine previously noted to be  around 1.6.  Given reports of decreased appetite and generalized weakness suspect prerenal cause of symptoms. -Admit to a medical telemetry bed -Normal saline IV fluids at 75 mL/h -Hold nephrotoxic agents -Recheck kidney function in a.m.  Possible complicated urinary tract infection: Present on admission.  Patient has Foley catheter in place since this procedure on 12/7.  Urine positive for moderate hemoglobin, moderate leukocytes, rare bacteria, and 11-20 WBCs.  Due to soft blood pressures patient had been started on Ceftin. -Follow-up urine culture -Continue empiric antibiotics of Rocephin   Back pain with history of falls, generalized weakness: Acute.  Patient has been falling over the last 3 weeks.  Reportedly has fractures of the T and L-spine for which he was seen by Dr. Venetia MaxonStern who recommended MRI of the back. -Check MRI of the lumbar and thoracic spine -PT/OT to evaluate and treat  Diabetes mellitus type 2 with hyperglycemia: On admission blood glucose elevated at 212.  Patient's home regimen includes Lantus 20 units nightly 2 units of Humalog with meals.  -Hypoglycemic protocols -CBGs before every meal with very sensitive SSI  -Continue home regimen of Lantus  Paroxysmal atrial fibrillation on chronic anticoagulation: Patient currently in atrial fibrillation but appears rate controlled.  He had urological procedure on 12/7.  He was told to hold anticoagulation 2 days before and advised to resume on 12/14. -Resume Eliquis with on 12/14  Normocytic anemia: Hemoglobin 11 which appears near his  baseline. -Continue to monitor  BPH: Patient status post rezum procedure on 12/7 with [facility]. -Continue Proscar  Hearing loss bilaterally: Patient has hearing aids in place.  Hyperlipidemia -Continue statin  DVT prophylaxis: Heparin Code Status: Full confirmed with family over the phone Family Communication: Discussed plan of care with the patient's daughter over the phone Disposition Plan: To be determined Consults called: None Admission status: Inpatient  Clydie Braunondell A Chanique Duca MD [facility] Pager [phone]   If 7PM-7AM, please contact night-coverage [email or web address] Password [email or web address]  11/24/2019, 2:44 PM

## 2019-11-24 NOTE — Plan of Care (Signed)

## 2019-11-24 NOTE — ED Provider Notes (Signed)
[facility] EMERGENCY DEPARTMENT Provider Note   CSN: [id number] Arrival date & time: 11/24/19  1207     History Chief Complaint  Patient presents with  . Hypotension    Daniel Cooke is a 85 y.o. male.  85 year old male with past medical history including dCHF, A  Fib on Eliquis, BPH, T2DM who p/w weakness.  On 12/7, the patient had a urologic procedure for his prostate and a urinary catheter was placed at that time.  He had been off of his blood thinner for this procedure, unclear whether it has been restarted.  He reports that for the past several days he has had malaise and decreased appetite with decreased oral intake.  This morning, he felt generally weak and was unable to get out of bed.  His home blood pressure monitor showed hypotension.  Blood glucose was 313 by EMS.  He reports chronic back pain but denies any new pain including no new chest pain, abdominal pain, vomiting. Has perhaps mild dysuria.  The history is provided by the patient and the EMS personnel.       Past Medical History:  Diagnosis Date  . Arthritis    "was in left knee before replacement; now have it in my right knee" (11/25/2016)  . BPH (benign prostatic hypertrophy)   . Chronic diastolic CHF (congestive heart failure) (HCC)    a. 02/2016 Echo: EF 60-65%, no rwma, triv AI, mild MR, mildly to mod dil LA, mod dil RA, PASP .  Marland Kitchen Dysrhythmia   . Essential hypertension   . Hard of hearing    wears bilateral hearing aids  . Hypercholesterolemia   . Osteoarthritis   . PAF (paroxysmal atrial fibrillation) (HCC)    a. CHADS2VASC score of 4 --> coumadin.  . Pilonidal cyst    PAST HX - NO PROBLEM NOW  . Pneumonia    "one time; years ago" (11/25/2016)  . Shingles    "long time ago"  . Synovitis of knee 02/27/2014  . Type II diabetes mellitus (HCC)    oral meds - no insulin    Patient Active Problem List   Diagnosis Date Noted  . Persistent atrial fibrillation (HCC)  11/15/2019  . Acquired thrombophilia (HCC) 11/15/2019  . Sensorineural hearing loss (SNHL) of both ears 11/08/2018  . Pain in left knee 07/28/2018  . Type II diabetes mellitus (HCC)   . Shingles   . Pneumonia   . Pilonidal cyst   . PAF (paroxysmal atrial fibrillation) (HCC)   . Osteoarthritis   . Hypercholesterolemia   . Hard of hearing   . Essential hypertension   . Dysrhythmia   . Chronic diastolic CHF (congestive heart failure) (HCC)   . Arthritis   . Compression fracture of fourth lumbar vertebra (HCC) 05/28/2017  . Acute on chronic diastolic CHF (congestive heart failure) (HCC) 11/27/2016  . Stage III chronic kidney disease 11/25/2016  . Anemia of chronic disease 11/25/2016  . HTN (hypertension) 11/25/2016  . HLD (hyperlipidemia) 11/25/2016  . Type 2 diabetes mellitus with stage 3 chronic kidney disease, without long-term current use of insulin (HCC) 11/25/2016  . Bilateral impacted cerumen 09/15/2016  . Encounter for therapeutic drug monitoring 03/20/2016  . Synovitis of knee 02/27/2014  . OA (osteoarthritis) of knee 02/08/2012    Past Surgical History:  Procedure Laterality Date  . APPENDECTOMY    . CARDIOVERSION N/A 02/04/2017   Procedure: CARDIOVERSION;  Surgeon: [name], MD;  Location: [facility] ENDOSCOPY;  Service: Cardiovascular;  Laterality: N/A;  . CATARACT EXTRACTION W/ INTRAOCULAR LENS IMPLANT Left 10/2016  . CHOLECYSTECTOMY OPEN    . COLONOSCOPY W/ BIOPSIES  08/2005   [name]/notes 04/27/2011  . EYE SURGERY Right    "right" traumatic cataract removed--injury to the eye--states his eyesight is ok in left eye  . JOINT REPLACEMENT    . KNEE ARTHROSCOPY Left 02/28/2014   Procedure: ARTHROSCOPY LEFT KNEE WITH SYNOVECTOMY;  Surgeon: Loanne DrillingFrank V Aluisio, MD;  Location: [facility] ORS;  Service: Orthopedics;  Laterality: Left;  . KYPHOPLASTY  2017  . KYPHOPLASTY N/A 05/28/2017   Procedure: Lumbar Four Kyphoplasty;  Surgeon: Maeola HarmanStern, Joseph, MD;  [facility];  Service: Neurosurgery;   Laterality: N/A;  L4 Kyphoplasty  . SHOULDER OPEN ROTATOR CUFF REPAIR Left   . TONSILLECTOMY    . TOTAL KNEE ARTHROPLASTY  02/08/2012   Procedure: TOTAL KNEE ARTHROPLASTY;  Surgeon: Loanne DrillingFrank V Aluisio, MD;  Location: [facility] ORS;  Service: Orthopedics;  Laterality: Left;  Marland Kitchen. VASECTOMY         Family History  Problem Relation Age of Onset  . Heart disease Mother   . Heart disease Brother   . Heart attack Brother     Social History   Tobacco Use  . Smoking status: Never Smoker  . Smokeless tobacco: Never Used  Substance Use Topics  . Alcohol use: No  . Drug use: No    Home Medications Prior to Admission medications   Medication Sig Start Date End Date Taking? Authorizing Provider  acetaminophen (TYLENOL) 325 MG tablet Take 325 mg by mouth every 6 (six) hours as needed for mild pain.    [provider]  apixaban (ELIQUIS) 2.5 MG TABS tablet Take 1 tablet (2.5 mg total) by mouth 2 (two) times daily. 10/11/19   Camnitz, Will Daphine DeutscherMartin, MD  finasteride (PROSCAR) 5 MG tablet Take 5 mg by mouth daily. 05/01/19   [provider]  furosemide (LASIX) 40 MG tablet Take 1/2 (one-half) tablet by mouth once daily 08/07/19   Camnitz, Andree CossWill Martin, MD  HUMALOG KWIKPEN 100 UNIT/ML KwikPen INJECT 3 UNITS PRIOR TO DINNER SUBCUTANEOUSLY ONCE A DAY 12/02/18   [provider]  Insulin Glargine (LANTUS SOLOSTAR) 100 UNIT/ML Solostar Pen Inject 20 Units into the skin at bedtime.     [provider]  Insulin Pen Needle (BD PEN NEEDLE NANO U/F) 32G X 4 MM MISC USE AS DIRECTED ONCE DAILY FOR 30 DAYS 10/28/18   [provider]  ONETOUCH VERIO test strip USE TO TEST YOUR BLOOD SUGAR THREE TIMES DAILY. 03/15/19   [provider]  simvastatin (ZOCOR) 20 MG tablet Take 20 mg by mouth at bedtime.    [provider]  terazosin (HYTRIN) 5 MG capsule Take 5 mg by mouth at bedtime.    [provider]    Allergies    Codeine, Oxycodone, and Other  Review  of Systems   Review of Systems All other systems reviewed and are negative except that which was mentioned in HPI  Physical Exam Updated Vital Signs BP (!) 106/59   Pulse 60   Temp 97.7 F (36.5 C) (Oral)   Resp 15   SpO2 100%   Physical Exam Vitals and nursing note reviewed.  Constitutional:      General: He is not in acute distress.    Appearance: He is well-developed.     Comments: comfortable  HENT:     Head: Normocephalic and atraumatic.  Eyes:     Conjunctiva/sclera: Conjunctivae normal.  Cardiovascular:     Rate and Rhythm: Normal rate and regular rhythm.     Heart sounds: Normal heart sounds. No murmur.  Pulmonary:     Effort: Pulmonary effort is normal.     Breath sounds: Normal breath sounds.  Abdominal:     General: Bowel sounds are normal. There is no distension.     Palpations: Abdomen is soft.     Tenderness: There is no abdominal tenderness.  Musculoskeletal:     Cervical back: Neck supple.     Right lower leg: No edema.     Left lower leg: No edema.  Skin:    General: Skin is warm and dry.  Neurological:     Mental Status: He is alert and oriented to person, place, and time.     Comments: Fluent speech  Psychiatric:        Judgment: Judgment normal.     ED Results / Procedures / Treatments   Labs (all labs ordered are listed, but only abnormal results are displayed) Labs Reviewed  COMPREHENSIVE METABOLIC PANEL - Abnormal; Notable for the following components:      Result Value   Glucose, Bld 212 (*)    BUN 47 (*)    Creatinine, Ser 2.47 (*)    Total Protein 5.1 (*)    Albumin 2.6 (*)    Total Bilirubin 2.0 (*)    GFR calc non Af Amer 22 (*)    GFR calc Af Amer 25 (*)    All other components within normal limits  CBC WITH DIFFERENTIAL/PLATELET - Abnormal; Notable for the following components:   RBC 3.50 (*)    Hemoglobin 11.0 (*)    HCT 34.4 (*)    All other components within normal limits  URINALYSIS, ROUTINE W REFLEX MICROSCOPIC -  Abnormal; Notable for the following components:   Hgb urine dipstick MODERATE (*)    Leukocytes,Ua MODERATE (*)    Bacteria, UA RARE (*)    All other components within normal limits  URINE CULTURE  CULTURE, BLOOD (ROUTINE X 2)  CULTURE, BLOOD (ROUTINE X 2)  SARS CORONAVIRUS 2 (TAT 6-24 HRS)  LACTIC ACID, PLASMA  PROTIME-INR  LACTIC ACID, PLASMA  POC SARS CORONAVIRUS 2 AG -  ED    EKG EKG Interpretation  Date/Time:  Friday November 24 2019 12:46:18 EST Ventricular Rate:  64 PR Interval:    QRS Duration: 99 QT Interval:  433 QTC Calculation: 447 R Axis:   57 Text Interpretation: Atrial fibrillation Ventricular premature complex Low voltage, precordial leads Consider anterior infarct Partial missing lead(s): V2 V3 V4 V5 V6 similar to previous Confirmed by Frederick Peers [phone]) on 11/24/2019 12:48:16 PM   Radiology DG Chest Port 1 View  Result Date: 11/24/2019 CLINICAL DATA:  Generalized weakness. EXAM: PORTABLE CHEST 1 VIEW COMPARISON:  November 25, 2016. FINDINGS: The heart size and mediastinal contours are within normal limits. Both lungs are clear. The visualized skeletal structures are unremarkable. IMPRESSION: No active disease. Electronically Signed   By: Lupita Raider M.D.   On: 11/24/2019 13:22    Procedures Procedures (including critical care time)  Medications Ordered in ED Medications  cefTRIAXone (ROCEPHIN) 1 g in sodium chloride 0.9 % 100 mL IVPB (has no administration in time range)  lactated ringers bolus 1,000 mL (1,000 mLs Intravenous New Bag/Given 11/24/19 1420)    ED Course  I have reviewed the triage vital signs and the nursing notes.  Pertinent labs & imaging results that were available during my  care of the patient were reviewed by me and considered in my medical decision making (see chart for details).    MDM Rules/Calculators/A&P     CHA2DS2/VAS Stroke Risk Points  Current as of 3 days ago     5 >= 2 Points: High Risk  1 - 1.99 Points:  Medium Risk  0 Points: Low Risk    This is the only CHA2DS2/VAS Stroke Risk Points available for the past  year.: Last Change: N/A     Details    This score determines the patient's risk of having a stroke if the  patient has atrial fibrillation.       Points Metrics  1 Has Congestive Heart Failure:  Yes    Current as of 3 days ago  0 Has Vascular Disease:  No    Current as of 3 days ago  1 Has Hypertension:  Yes    Current as of 3 days ago  2 Age:  13    Current as of 3 days ago  1 Has Diabetes:  Yes    Current as of 3 days ago  0 Had Stroke:  No  Had TIA:  No  Had thromboembolism:  No    Current as of 3 days ago  0 Male:  No    Current as of 3 days ago                         Comfortable on exam, normal vital signs with BP 117/63.  Afebrile.  Differential is broad and includes UTI, dehydration, COVID-19, anemia.  Point-of-care Covid negative, 6-hour pending.  Normal lactate, INR 1.1, hemoglobin stable at 11, normal WBC count, reacting elevated from baseline at 2.47 today, I suspect due to dehydration from poor p.o. intake.  I discussed case with urologist on-call, Dr. Alinda Money, who recommended leaving Foley catheter in place to avoid any further kidney injury from obstruction if he cannot urinate.  They are available for consultation if needed.  His urine today is equivocal with moderate leukocytes and some RBCs and WBCs, can be due to to recent procedure however because of his borderline blood pressures, covered with ceftriaxone.  I have ordered urine culture.  Also ordered a liter of fluids.  Discussed admission with [facility], Dr. Tamala Julian.  EMAD BRECHTEL was evaluated in Emergency Department on 11/24/2019 for the symptoms described in the history of present illness. He was evaluated in the context of the global COVID-19 pandemic, which necessitated consideration that the patient might be at risk for infection with the SARS-CoV-2 virus that causes COVID-19. Institutional protocols  and algorithms that pertain to the evaluation of patients at risk for COVID-19 are in a state of rapid change based on information released by regulatory bodies including the CDC and federal and state organizations. These policies and algorithms were followed during the patient's care in the ED.  Final Clinical Impression(s) / ED Diagnoses Final diagnoses:  None    Rx / DC Orders ED Discharge Orders    None       Deantae Shackleton, Wenda Overland, MD 11/24/19 1517

## 2019-11-24 NOTE — Progress Notes (Signed)
Pt brought today for T/L Spine MRI. Pt unable to continue L-spine portion of exam. Requested to be taken out of scanner. Michela Pitcher he was in too much pain and could not take it anymore. Pt declined continuing scan even if nurse was able to give meds.

## 2019-11-25 ENCOUNTER — Inpatient Hospital Stay (HOSPITAL_COMMUNITY): Payer: Medicare Other

## 2019-11-25 DIAGNOSIS — R627 Adult failure to thrive: Secondary | ICD-10-CM

## 2019-11-25 DIAGNOSIS — E876 Hypokalemia: Secondary | ICD-10-CM

## 2019-11-25 DIAGNOSIS — E109 Type 1 diabetes mellitus without complications: Secondary | ICD-10-CM

## 2019-11-25 LAB — BASIC METABOLIC PANEL
Anion gap: 13 (ref 5–15)
BUN: 43 mg/dL — ABNORMAL HIGH (ref 8–23)
CO2: 25 mmol/L (ref 22–32)
Calcium: 9.8 mg/dL (ref 8.9–10.3)
Chloride: 103 mmol/L (ref 98–111)
Creatinine, Ser: 2.11 mg/dL — ABNORMAL HIGH (ref 0.61–1.24)
GFR calc Af Amer: 30 mL/min — ABNORMAL LOW (ref >60)
GFR calc non Af Amer: 26 mL/min — ABNORMAL LOW (ref >60)
Glucose, Bld: 71 mg/dL (ref 70–99)
Potassium: 3.3 mmol/L — ABNORMAL LOW (ref 3.5–5.1)
Sodium: 141 mmol/L (ref 135–145)

## 2019-11-25 LAB — URINE CULTURE: Culture: <10000 — AB

## 2019-11-25 LAB — GLUCOSE, CAPILLARY
Glucose-Capillary: 55 mg/dL — ABNORMAL LOW (ref 70–99)
Glucose-Capillary: 78 mg/dL (ref 70–99)
Glucose-Capillary: 128 mg/dL — ABNORMAL HIGH (ref 70–99)
Glucose-Capillary: 116 mg/dL — ABNORMAL HIGH (ref 70–99)
Glucose-Capillary: 95 mg/dL (ref 70–99)
Glucose-Capillary: 97 mg/dL (ref 70–99)

## 2019-11-25 LAB — CBC
HCT: 32.6 % — ABNORMAL LOW (ref 39.0–52.0)
Hemoglobin: 10.9 g/dL — ABNORMAL LOW (ref 13.0–17.0)
MCH: 32.1 pg (ref 26.0–34.0)
MCHC: 33.4 g/dL (ref 30.0–36.0)
MCV: 95.9 fL (ref 80.0–100.0)
Platelets: 222 10*3/uL (ref 150–400)
RBC: 3.4 MIL/uL — ABNORMAL LOW (ref 4.22–5.81)
RDW: 13.6 % (ref 11.5–15.5)
WBC: 5.7 10*3/uL (ref 4.0–10.5)
nRBC: 0 % (ref 0.0–0.2)

## 2019-11-25 MED ORDER — INSULIN GLARGINE 100 UNIT/ML ~~LOC~~ SOLN
15.0000 [IU] | Freq: Every day | SUBCUTANEOUS | Status: DC
  Administered 2019-11-25: 15 [IU] via SUBCUTANEOUS
  Filled 2019-11-25 (×2): qty 0.15

## 2019-11-25 MED ORDER — POTASSIUM CHLORIDE CRYS ER 20 MEQ PO TBCR
40.0000 meq | EXTENDED_RELEASE_TABLET | Freq: Once | ORAL | Status: AC
  Administered 2019-11-25: 40 meq via ORAL

## 2019-11-25 NOTE — Progress Notes (Signed)
Consulted by Dr. Erlinda Hong regarding pt's repeat spine imaging, pt has acute/subacute T12, L2, and L5 compression frx, no emergent neurosurgical intervention indicated. I am still performing urgent surgical procedures this evening, will therefore evaluate the patient and talk to the patient's family tomorrow on morning rounds.

## 2019-11-25 NOTE — Progress Notes (Signed)
PT Cancellation Note  Patient Details Name: Daniel Cooke MRN: [id number] DOB: 1924-06-18   Cancelled Treatment:    Reason Eval/Treat Not Completed: Patient at procedure or test/unavailable. Will follow.   Blondell Reveal Kistler PT 11/25/2019  Acute Rehabilitation Services Pager [phone] Office [phone]

## 2019-11-25 NOTE — Progress Notes (Addendum)
PROGRESS NOTE  Daniel Cooke ZOX:[id number] DOB: Feb 19, 1924 DOA: 11/24/2019 PCP: Farris Has, MD  Brief summary: Per HPI: Patient had been more weak with decreased appetite per family.  They also report recent history of falls within the last 3weeks.  He been complaining of significant mid to lower back pain and with any kind of movement.  Daughter reports he had been seen by Dr.Stern and they had recommended patient have MRI of the back for further evaluation fractures of the T and L-spine.  He has had previous injections without relief of symptoms.  CODE STATUS was discussed with patient and his daughter over the phone would like patient to remain a full code.  Per ED triage intake:  Pt brought in by [facility] from home for generalized weakness. Pt used his home BP monitor and was hypotensive. Pt CBG 313. Per family pt has had poor oral intake over the last few days. Pt had a urinary catheter placed on Monday. Per EMS pt has been off of his blood thinners for his procedure on Monday.  covid screening negative  HPI/Recap of past 24 hours:  Appear very weak, very hard of hearing  Assessment/Plan: Principal Problem:   Acute kidney injury superimposed on chronic kidney disease (HCC) Active Problems:   HLD (hyperlipidemia)   Type II diabetes mellitus (HCC)   AF (paroxysmal atrial fibrillation) (HCC)   Hard of hearing   Sensorineural hearing loss (SNHL) of both ears   Complicated urinary tract infection   Back pain   Generalized weakness  AKI/CKD III -Patient presents with creatinine elevated up to 2.47 with BUN 47.  Baseline creatinine previously noted to be around 1.6.  Given reports of decreased appetite and generalized weakness suspect prerenal cause of symptoms, home medication Lasix held since admission -ua with moderate leukocyte, rare bacteria, negative nitrite, negative protein.  Urine culture with insignificant growth, blood culture no growth to date -There is no fever ,no  leukocytosis, however,  due to recent urologic procedure on 12/7, patient is started on Rocephin empirically on admission, will continue for now -will order Renal US -cr improving, Continue IV fluids for another 24 hours, [name] in the morning, renal dosing meds  BPH: Patient status post rezum procedure on 12/7 with [facility]. -Continue Proscar -I have discussed with urologist Dr. Laverle Patter  who recommended keep the current Foley catheter in , donot switch foley,  Dr. Laverle Patter will set up follow-up with Dr. Alvester Morin in the office.  Hypokalemia Replace K, repeat in the morning  PAF -Patient currently in atrial fibrillation but appears rate controlled.  He had urological procedure on 12/7, He was told to hold anticoagulation 2 days before and advised to resume on 12/14. -Plan to resume Eliquis if no planned back surgery/injection by Dr. Venetia Maxon, will need to follow-up with MRIs result and discussed with Dr. Venetia Maxon on Monday. I have reviewed her recent EP notes from December 2, who plan to do DCCV after 3 weeks of uninterrupted anticoagulation. Patient is to follow-up with EP after discharge.  Back pain with history of falls, generalized weakness: Acute.  Patient has been falling over the last 3 weeks.  Reportedly has fractures of the T and L-spine for which he was seen by Dr. Venetia Maxon who recommended MRI of the back. -Check MRI of the lumbar and thoracic spine, daughter prefer Dr. Venetia Maxon to be contacted prior to discharge.  Resume Eliquis on Monday if okay with Dr. Venetia Maxon. -PT/OT to evaluate and treat   3pm  addendum: MRI thoracic spine:  "Mild compression fractures T2, T3, T4, T5, T12. All these appear to be recent benign fractures. Chronic severe fracture of L1 with kyphoplasty cement. Negative for spinal stenosis."  MRI lumber spine: 1. Moderate acute to subacute compression fracture of the L5 superior endplate. Minor retropulsion of bone and no complicating features. 2. Minimal acute to  subacute inferior endplate compression of L2 on the left. 3. Acute to subacute T12 compression fracture as reported yesterday. 4. Chronic and previously augmented L1 and L4 compression fractures. 5. Nonspecific but possibly posttraumatic edema in the lumbar erector spinae muscles greater on the right  Case discussed with neurosurgery on call Dr Zada Finders: per Dr Zada Finders patient does not need surgical intervention, Dr Zada Finders is going to see the patient and talk to daughter  [name]. Dr Zada Finders input greatly appreciated.  Diabetes mellitus type 2 with hyperglycemia:  On admission blood glucose elevated at 212.  Patient's home regimen includes Lantus 20 units nightly 2 units of Humalog with meals.  -Hypoglycemic protocols -CBGs before every meal with very sensitive SSI  -Fingerstick blood glucose dropped to 71 around 3 AM , will decrease Lantus from 20 units nightly to 15 unit nightly continue SSI   Hearing loss bilaterally: Patient has hearing aids in place  FTT: Per daughter patient has been independent until 6 to 8 weeks ago.  Patient is not interested in skilled nursing facility or assisted living We will get PT OT eval.  DVT Prophylaxis:sub Q heparin  Code Status: full  Family Communication: patient , daughter over the phone  Disposition Plan: Not ready to discharge Need Dr Melven Sartorius input prior to discharge, family does not desire snf, will need to maximize home health  Consultants:  Urologist Dr. Alinda Money over the phone on December 12  Procedures:  None  Antibiotics:  Rocephin since admission   Objective: BP 132/75 (BP Location: Left Wrist)   Pulse 84   Temp 97.6 F (36.4 C) (Oral)   Resp 19   Ht 5\' 8"  (1.727 m)   Wt 76 kg   SpO2 99%   BMI 25.48 kg/m   Intake/Output Summary (Last 24 hours) at 11/25/2019 1141 Last data filed at 11/25/2019 9211 Gross per 24 hour  Intake 2304.59 ml  Output 600 ml  Net 1704.59 ml   Filed Weights   11/24/19 1650 11/25/19  0401  Weight: 76.4 kg 76 kg    Exam: Patient is examined daily including today on 11/25/2019, exams remain the same as of yesterday except that has changed    General: Frail, NAD, hard of hearing  Cardiovascular: IRRR  Respiratory: CTABL  Abdomen: Soft/ND/NT, positive BS  Musculoskeletal: No Edema  Neuro: alert, oriented   Data Reviewed: Basic Metabolic Panel: Recent Labs  Lab 11/24/19 1300 11/25/19 0331  NA 137 141  K 3.8 3.3*  CL 102 103  CO2 28 25  GLUCOSE 212* 71  BUN 47* 43*  CREATININE 2.47* 2.11*  CALCIUM 9.5 9.8   Liver Function Tests: Recent Labs  Lab 11/24/19 1300  AST 28  ALT 27  ALKPHOS 90  BILITOT 2.0*  PROT 5.1*  ALBUMIN 2.6*   No results for input(s): LIPASE, AMYLASE in the last 168 hours. No results for input(s): AMMONIA in the last 168 hours. CBC: Recent Labs  Lab 11/24/19 1300 11/25/19 0331  WBC 6.1 5.7  NEUTROABS 4.6  --   HGB 11.0* 10.9*  HCT 34.4* 32.6*  MCV 98.3 95.9  PLT 214 222   Cardiac  Enzymes:   No results for input(s): CKTOTAL, CKMB, CKMBINDEX, TROPONINI in the last 168 hours. BNP (last 3 results) No results for input(s): BNP in the last 8760 hours.  ProBNP (last 3 results) No results for input(s): PROBNP in the last 8760 hours.  CBG: Recent Labs  Lab 11/24/19 1720 11/24/19 2100 11/25/19 0604 11/25/19 0628 11/25/19 0650  GLUCAP 146* 146* 55* 78 128*    Recent Results (from the past 240 hour(s))  Urine culture     Status: Abnormal   Collection Time: 11/24/19  1:33 PM   Specimen: Urine, Catheterized  Result Value Ref Range Status   Specimen Description URINE, CATHETERIZED  Final   Special Requests NONE  Final   Culture (A)  Final    <10,000 COLONIES/mL INSIGNIFICANT GROWTH Performed at [facility], [address], [place], [place] [address]    Report Status 11/25/2019 FINAL  Final  Culture, blood (routine x 2)     Status: None (Preliminary result)   Collection Time: 11/24/19  3:15 PM    Specimen: BLOOD  Result Value Ref Range Status   Specimen Description BLOOD RIGHT ANTECUBITAL  Final   Special Requests   Final    BOTTLES DRAWN AEROBIC AND ANAEROBIC Blood Culture results may not be optimal due to an excessive volume of blood received in culture bottles   Culture   Final    NO GROWTH < 24 HOURS Performed at [facility], [address] [place], [place] [address]    Report Status PENDING  Incomplete  Culture, blood (routine x 2)     Status: None (Preliminary result)   Collection Time: 11/24/19  3:20 PM   Specimen: BLOOD RIGHT WRIST  Result Value Ref Range Status   Specimen Description BLOOD RIGHT WRIST  Final   Special Requests   Final    BOTTLES DRAWN AEROBIC ONLY Blood Culture results may not be optimal due to an inadequate volume of blood received in culture bottles   Culture   Final    NO GROWTH < 24 HOURS Performed at [facility], [address], [place], [place] [address]    Report Status PENDING  Incomplete  SARS CORONAVIRUS 2 (TAT 6-24 HRS) Nasopharyngeal Nasopharyngeal Swab     Status: None   Collection Time: 11/24/19  3:27 PM   Specimen: Nasopharyngeal Swab  Result Value Ref Range Status   SARS Coronavirus 2 NEGATIVE NEGATIVE Final    Comment: (NOTE) SARS-CoV-2 target nucleic acids are NOT DETECTED. The SARS-CoV-2 RNA is generally detectable in upper and lower respiratory specimens during the acute phase of infection. Negative results do not preclude SARS-CoV-2 infection, do not rule out co-infections with other pathogens, and should not be used as the sole basis for treatment or other patient management decisions. Negative results must be combined with clinical observations, patient history, and epidemiological information. The expected result is Negative. Fact Sheet for Patients: HairSlick.no Fact Sheet for Healthcare Providers: quierodirigir.com This test is not yet approved or  cleared by the [place] FDA and  has been authorized for detection and/or diagnosis of SARS-CoV-2 by FDA under an Emergency Use Authorization (EUA). This EUA will remain  in effect (meaning this test can be used) for the duration of the COVID-19 declaration under Section 56 4(b)(1) of the Act, 21 U.S.C. section 360bbb-3(b)(1), unless the authorization is terminated or revoked sooner. Performed at [facility], [address] [place], [place] [address]      Studies:  MR THORACIC SPINE WO CONTRAST  Result Date: 11/24/2019 CLINICAL DATA:  Back pain.  Rule out fracture. EXAM: MRI THORACIC SPINE WITHOUT CONTRAST TECHNIQUE: Multiplanar, multisequence MR imaging of the thoracic spine was performed. No intravenous contrast was administered. COMPARISON:  None. FINDINGS: Alignment:  Normal Vertebrae: Mild compression fractures T2, T3, T4, T5 with bone marrow edema indicating recent fractures. Mild compression fracture T12 vertebral body with diffuse bone marrow edema consistent with recent fracture. Chronic fracture L1 with kyphoplasty cement. Negative for mass lesion. Cord:  Normal cord signal.  No cord lesion or cord compression Paraspinal and other soft tissues: Negative for paraspinous mass or edema. Disc levels: Mild disc degeneration throughout the thoracic spine. No significant spinal stenosis. Small left-sided disc protrusion T10-11. IMPRESSION: Mild compression fractures T2, T3, T4, T5, T12. All these appear to be recent benign fractures. Chronic severe fracture of L1 with kyphoplasty cement. Negative for spinal stenosis. Electronically Signed   By: Marlan Palauharles  Clark M.D.   On: 11/24/2019 20:34   DG Chest Port 1 View  Result Date: 11/24/2019 CLINICAL DATA:  Generalized weakness. EXAM: PORTABLE CHEST 1 VIEW COMPARISON:  November 25, 2016. FINDINGS: The heart size and mediastinal contours are within normal limits. Both lungs are clear. The visualized skeletal structures are unremarkable.  IMPRESSION: No active disease. Electronically Signed   By: Lupita RaiderJames  Green Jr M.D.   On: 11/24/2019 13:22    Scheduled Meds: . finasteride  5 mg Oral Daily  . heparin  5,000 Units Subcutaneous Q8H  . insulin aspart  0-6 Units Subcutaneous TID WC  . insulin glargine  20 Units Subcutaneous QHS  . simvastatin  20 mg Oral QHS  . sodium chloride flush  3 mL Intravenous Q12H    Continuous Infusions: . sodium chloride 75 mL/hr at 11/25/19 1104  . cefTRIAXone (ROCEPHIN)  IV       Time spent: 35mins I have personally reviewed and interpreted on  11/25/2019 daily labs, tele strips, imagings as discussed above under date review session and assessment and plans.  I reviewed all nursing notes, pharmacy notes, consultant notes,  vitals, pertinent old records  I have discussed plan of care as described above with RN , patient and family on 11/25/2019   Albertine GratesFang Mete Purdum MD, PhD, FACP  [facility] Pager [phone]. If 7PM-7AM, please contact night-coverage at [email or web address], password [email or web address] 11/25/2019, 11:41 AM  LOS: 1 day

## 2019-11-26 LAB — BASIC METABOLIC PANEL WITH GFR
Anion gap: 11 (ref 5–15)
BUN: 31 mg/dL — ABNORMAL HIGH (ref 8–23)
CO2: 25 mmol/L (ref 22–32)
Calcium: 9.3 mg/dL (ref 8.9–10.3)
Chloride: 106 mmol/L (ref 98–111)
Creatinine, Ser: 1.66 mg/dL — ABNORMAL HIGH (ref 0.61–1.24)
GFR calc Af Amer: 41 mL/min — ABNORMAL LOW (ref >60)
GFR calc non Af Amer: 35 mL/min — ABNORMAL LOW (ref >60)
Glucose, Bld: 54 mg/dL — ABNORMAL LOW (ref 70–99)
Potassium: 3.8 mmol/L (ref 3.5–5.1)
Sodium: 142 mmol/L (ref 135–145)

## 2019-11-26 LAB — GLUCOSE, CAPILLARY
Glucose-Capillary: 53 mg/dL — ABNORMAL LOW (ref 70–99)
Glucose-Capillary: 55 mg/dL — ABNORMAL LOW (ref 70–99)
Glucose-Capillary: 65 mg/dL — ABNORMAL LOW (ref 70–99)
Glucose-Capillary: 78 mg/dL (ref 70–99)
Glucose-Capillary: 217 mg/dL — ABNORMAL HIGH (ref 70–99)
Glucose-Capillary: 151 mg/dL — ABNORMAL HIGH (ref 70–99)
Glucose-Capillary: 247 mg/dL — ABNORMAL HIGH (ref 70–99)

## 2019-11-26 LAB — MAGNESIUM: Magnesium: 1.9 mg/dL (ref 1.7–2.4)

## 2019-11-26 MED ORDER — CHLORHEXIDINE GLUCONATE CLOTH 2 % EX PADS
6.0000 | MEDICATED_PAD | Freq: Every day | CUTANEOUS | Status: DC
  Administered 2019-11-26 – 2019-11-27 (×2): 6 via TOPICAL

## 2019-11-26 MED ORDER — INSULIN GLARGINE 100 UNIT/ML ~~LOC~~ SOLN
8.0000 [IU] | Freq: Every day | SUBCUTANEOUS | Status: DC
  Administered 2019-11-26: 8 [IU] via SUBCUTANEOUS
  Filled 2019-11-26 (×2): qty 0.08

## 2019-11-26 NOTE — Progress Notes (Signed)
Patient am CBG 53. Patient slightly confused. Food and orange juice given. CBG 78.

## 2019-11-26 NOTE — Consult Note (Signed)
Neurosurgery Consultation  Reason for Consult: Back pain Referring Physician: Roda Shutters  CC: Back pain  HPI: This is a 85 y.o. man that presents with acute on chronic back pain after Rezum procedure for BPH. He has had increasing falls over the past few weeks with worsening of his back pain, currently admitted for diffuse weakness. No new focal weakness, numbness, or parasthesias, no recent change in bowel function / sensation. He normally sees Dr. Venetia Maxon for his lumbar/thoracic issues and was planning to get a new MRI in the near future as an outpatient, which has been obtained during this admission. Pain is located entirely in the mid to low back, sharp in quality, moderate in intensity, worse with activity, improved with rest.    ROS: A 14 point ROS was performed and is negative except as noted in the HPI.   PMHx:  Past Medical History:  Diagnosis Date  . Arthritis    "was in left knee before replacement; now have it in my right knee" (11/25/2016)  . BPH (benign prostatic hypertrophy)   . Chronic diastolic CHF (congestive heart failure) (HCC)    a. 02/2016 Echo: EF 60-65%, no rwma, triv AI, mild MR, mildly to mod dil LA, mod dil RA, PASP .  Marland Kitchen Dysrhythmia   . Essential hypertension   . Hard of hearing    wears bilateral hearing aids  . Hypercholesterolemia   . Osteoarthritis   . PAF (paroxysmal atrial fibrillation) (HCC)    a. CHADS2VASC score of 4 --> coumadin.  . Pilonidal cyst    PAST HX - NO PROBLEM NOW  . Pneumonia    "one time; years ago" (11/25/2016)  . Shingles    "long time ago"  . Synovitis of knee 02/27/2014  . Type II diabetes mellitus (HCC)    oral meds - no insulin   FamHx:  Family History  Problem Relation Age of Onset  . Heart disease Mother   . Heart disease Brother   . Heart attack Brother    SocHx:  reports that he has never smoked. He has never used smokeless tobacco. He reports that he does not drink alcohol or use drugs.  Exam: Vital signs in last  24 hours: Temp:  [97.5 F (36.4 C)-98.1 F (36.7 C)] 97.7 F (36.5 C) (12/13 1127) Pulse Rate:  [67-85] 69 (12/13 1127) Resp:  [16-19] 18 (12/13 0718) BP: (111-138)/(51-70) 111/58 (12/13 1127) SpO2:  [96 %-100 %] 97 % (12/13 1127) Weight:  [77.2 kg] 77.2 kg (12/13 0404) General: Awake, alert, cooperative, lying in bed in NAD Head: normocephalic and atruamatic HEENT: neck supple Pulmonary: breathing room air comfortably, no evidence of increased work of breathing Cardiac: RRR Abdomen: S NT ND Extremities: warm and well perfused x4 Neuro: AOx3, PERRL, EOMI, FS Strength 5/5 x4, SILTx4   Assessment and Plan: 85 y.o. man w/ diffuse weakness after Rezum procedure. MRI T/L-spine personally reviewed and compared to prior scan in 2018, interval development of some lumbar paraspinal muscular edema, prior kyphos, L5 cx fx, L2 chronic inf endplate frx, T12 cx fx, some retropulsion of L5 fragment but no clinically significant resulting stenosis.  -no acute neurosurgical intervention indicated at this time, recommend maximal medical therapy for osteoporosis, PT to help with gait training. I suspect a lot of his more acute pain is due to the paraspinal muscular edema from either a strain or contusion during a fall -f/u w/ Dr. Venetia Maxon as an outpatient as previously scheduled  Jadene Pierini, MD 11/26/19 12:01 PM  [facility]

## 2019-11-26 NOTE — Progress Notes (Signed)
Placed patient on low bed with mats

## 2019-11-26 NOTE — Progress Notes (Signed)
PROGRESS NOTE  Daniel Cooke UJW:[id number] DOB: 07/11/1924 DOA: 11/24/2019 PCP: Farris Has, MD  Brief summary: Per HPI: Patient had been more weak with decreased appetite per family.  They also report recent history of falls within the last 3weeks.  He been complaining of significant mid to lower back pain and with any kind of movement.  Daughter reports he had been seen by Dr.Stern and they had recommended patient have MRI of the back for further evaluation fractures of the T and L-spine.  He has had previous injections without relief of symptoms.  CODE STATUS was discussed with patient and his daughter over the phone would like patient to remain a full code.  Per ED triage intake:  Pt brought in by [facility] from home for generalized weakness. Pt used his home BP monitor and was hypotensive. Pt CBG 313. Per family pt has had poor oral intake over the last few days. Pt had a urinary catheter placed on Monday. Per EMS pt has been off of his blood thinners for his procedure on Monday.  covid screening negative  HPI/Recap of past 24 hours:   Hypoglycemic event this am Appear stronger, he is sitting up in chair  Assessment/Plan: Principal Problem:   Acute kidney injury superimposed on chronic kidney disease (HCC) Active Problems:   HLD (hyperlipidemia)   Type II diabetes mellitus (HCC)   AF (paroxysmal atrial fibrillation) (HCC)   Hard of hearing   Sensorineural hearing loss (SNHL) of both ears   Complicated urinary tract infection   Back pain   Generalized weakness  AKI/CKD III -Patient presents with creatinine elevated up to 2.47 with BUN 47.  Baseline creatinine previously noted to be around 1.6.  Given reports of decreased appetite and generalized weakness suspect prerenal cause of symptoms, home medication Lasix held since admission -ua with moderate leukocyte, rare bacteria, negative nitrite, negative protein.  Urine culture with insignificant growth, blood culture no  growth to date -Renal ultrasound "increased renal echogenicity with cortical thinning compatible with chronic medical renal disease.  No acute finding or hydronephrosis. 1.7 cm left kidney lower pole cyst -There is no fever ,no leukocytosis, however,  due to recent urologic procedure on 12/7, patient is started on Rocephin empirically on admission, he received 3 dose of Rocephin, clinically he is improving will DC antibiotics. -cr improving, now close to baseline , DC  IV fluids, encourage oral intake, renal dosing meds  BPH: Patient status post rezum procedure on 12/7 with [facility]. -Continue Proscar -I have discussed with urologist Dr. Laverle Patter  who recommended keep the current Foley catheter in , donot switch foley,  Dr. Laverle Patter will set up follow-up with Dr. Alvester Morin in the office. -daughter [name] will contact Dr bell's office on Monday , she would like urologist to evaluate the patient while he is in the hospital  Hypokalemia k3.3 on presentation, status post placement, K this morning 3.8, mag is 1.9 , repeat in the morning, replace as needed  PAF -Patient currently in atrial fibrillation but rate controlled.  He had urological procedure on 12/7, He was told to hold anticoagulation 2 days before and advised to resume on 12/14. -Continue hold Eliquis for now , daughter requested kyphoplasty evaluation for patient's back, -Plan to resume Eliquis if no planned back surgery/injection or kyphoplasty -I have reviewed recent EP notes from December 2, EP plan to do DCCV after 3 weeks of uninterrupted anticoagulation. -Patient clinically is improving, will DC telemetry  -patient is to follow-up with EP  after discharge.  Back pain with history of falls, generalized weakness: Acute.   -Patient has been falling over the last 3 weeks.  Reportedly has fractures of the T and L-spine for which he was seen by Dr. Venetia Maxon who recommended MRI of the back. --PT/OT to evaluate and treat    MRI thoracic  spine:  "Mild compression fractures T2, T3, T4, T5, T12. All these appear to be recent benign fractures. Chronic severe fracture of L1 with kyphoplasty cement. Negative for spinal stenosis."  MRI lumber spine: 1. Moderate acute to subacute compression fracture of the L5 superior endplate. Minor retropulsion of bone and no complicating features. 2. Minimal acute to subacute inferior endplate compression of L2 on the left. 3. Acute to subacute T12 compression fracture as reported yesterday. 4. Chronic and previously augmented L1 and L4 compression fractures. 5. Nonspecific but possibly posttraumatic edema in the lumbar erector spinae muscles greater on the right   neurosurgery Dr Maurice Small consulted, no surgical intervention needed per Dr Maurice Small,  daughter [name] would like to talk to Dr Venetia Maxon on Monday, daughter [name] also reqeust kyphoplasty evaluation.  Diabetes mellitus type 2 with hyperglycemia on presentation and hypoglycemia event while in the hospital:  On admission blood glucose elevated at 212.  Patient's home regimen includes Lantus 20 units nightly 2 units of Humalog with meals.  -Hypoglycemic protocols -CBGs before every meal with very sensitive SSI  -Fingerstick blood glucose dropped to 71 around 3 AM , will decrease Lantus from 20 units nightly to 15 unit nightly continue SSI  -hypoglycemia event likely due to aki and poor oral intake  Hearing loss bilaterally: Patient has hearing aids in place  FTT: Per daughter patient has been independent,  patient still goes to work on the weekend.  Patient is not interested in skilled nursing facility or assisted living PT OT eval.  DVT Prophylaxis:sub Q heparin  Code Status: full  Family Communication: patient , daughter [name] over the phone daily Disposition Plan: daughter requests Dr Fredrich Birks input prior to discharge  family does not desire snf, will need to maximize home health  Consultants:  Urologist Dr. Laverle Patter over  the phone on December 12  Neurosurgery Dr Maurice Small  Procedures:  None  Antibiotics:  Rocephin x3 days   Objective: BP 138/70 (BP Location: Left Arm)   Pulse 77   Temp 97.8 F (36.6 C) (Oral)   Resp 18   Ht 5\' 8"  (1.727 m)   Wt 77.2 kg Comment: scale b  SpO2 98%   BMI 25.86 kg/m   Intake/Output Summary (Last 24 hours) at 11/26/2019 0801 Last data filed at 11/26/2019 0650 Gross per 24 hour  Intake 480 ml  Output 1475 ml  Net -995 ml   Filed Weights   11/24/19 1650 11/25/19 0401 11/26/19 0404  Weight: 76.4 kg 76 kg 77.2 kg    Exam: Patient is examined daily including today on 11/26/2019, exams remain the same as of yesterday except that has changed    General: NAD, hard of hearing, very pleasant   Cardiovascular: IRRR  Respiratory: CTABL  Abdomen: Soft/ND/NT, positive BS  Musculoskeletal: No Edema  Neuro: alert, oriented x3  Data Reviewed: Basic Metabolic Panel: Recent Labs  Lab 11/24/19 1300 11/25/19 0331 11/26/19 0413  NA 137 141 142  K 3.8 3.3* 3.8  CL 102 103 106  CO2 28 25 25   GLUCOSE 212* 71 54*  BUN 47* 43* 31*  CREATININE 2.47* 2.11* 1.66*  CALCIUM 9.5 9.8 9.3  MG  --   --  1.9   Liver Function Tests: Recent Labs  Lab 11/24/19 1300  AST 28  ALT 27  ALKPHOS 90  BILITOT 2.0*  PROT 5.1*  ALBUMIN 2.6*   No results for input(s): LIPASE, AMYLASE in the last 168 hours. No results for input(s): AMMONIA in the last 168 hours. CBC: Recent Labs  Lab 11/24/19 1300 11/25/19 0331  WBC 6.1 5.7  NEUTROABS 4.6  --   HGB 11.0* 10.9*  HCT 34.4* 32.6*  MCV 98.3 95.9  PLT 214 222   Cardiac Enzymes:   No results for input(s): CKTOTAL, CKMB, CKMBINDEX, TROPONINI in the last 168 hours. BNP (last 3 results) No results for input(s): BNP in the last 8760 hours.  ProBNP (last 3 results) No results for input(s): PROBNP in the last 8760 hours.  CBG: Recent Labs  Lab 11/25/19 1614 11/25/19 2126 11/26/19 0644 11/26/19 0659  11/26/19 0737  GLUCAP 95 97 53* 55* 78    Recent Results (from the past 240 hour(s))  Urine culture     Status: Abnormal   Collection Time: 11/24/19  1:33 PM   Specimen: Urine, Catheterized  Result Value Ref Range Status   Specimen Description URINE, CATHETERIZED  Final   Special Requests NONE  Final   Culture (A)  Final    <10,000 COLONIES/mL INSIGNIFICANT GROWTH Performed at [facility] [address], [place], [place] [address]    Report Status 11/25/2019 FINAL  Final  Culture, blood (routine x 2)     Status: None (Preliminary result)   Collection Time: 11/24/19  3:15 PM   Specimen: BLOOD  Result Value Ref Range Status   Specimen Description BLOOD RIGHT ANTECUBITAL  Final   Special Requests   Final    BOTTLES DRAWN AEROBIC AND ANAEROBIC Blood Culture results may not be optimal due to an excessive volume of blood received in culture bottles   Culture   Final    NO GROWTH 2 DAYS Performed at [facility], [place] [address] [place], [place] [address]    Report Status PENDING  Incomplete  Culture, blood (routine x 2)     Status: None (Preliminary result)   Collection Time: 11/24/19  3:20 PM   Specimen: BLOOD RIGHT WRIST  Result Value Ref Range Status   Specimen Description BLOOD RIGHT WRIST  Final   Special Requests   Final    BOTTLES DRAWN AEROBIC ONLY Blood Culture results may not be optimal due to an inadequate volume of blood received in culture bottles   Culture   Final    NO GROWTH 2 DAYS Performed at [facility], [place] [address], [place], [place] [address]    Report Status PENDING  Incomplete  SARS CORONAVIRUS 2 (TAT 6-24 HRS) Nasopharyngeal Nasopharyngeal Swab     Status: None   Collection Time: 11/24/19  3:27 PM   Specimen: Nasopharyngeal Swab  Result Value Ref Range Status   SARS Coronavirus 2 NEGATIVE NEGATIVE Final    Comment: (NOTE) SARS-CoV-2 target nucleic acids are NOT DETECTED. The SARS-CoV-2 RNA is generally detectable in upper and  lower respiratory specimens during the acute phase of infection. Negative results do not preclude SARS-CoV-2 infection, do not rule out co-infections with other pathogens, and should not be used as the sole basis for treatment or other patient management decisions. Negative results must be combined with clinical observations, patient history, and epidemiological information. The expected result is Negative. Fact Sheet for Patients: SugarRoll.be Fact Sheet for Healthcare Providers: https://www.woods-mathews.com/ This  test is not yet approved or cleared by the [place] and  has been authorized for detection and/or diagnosis of SARS-CoV-2 by FDA under an Emergency Use Authorization (EUA). This EUA will remain  in effect (meaning this test can be used) for the duration of the COVID-19 declaration under Section 56 4(b)(1) of the Act, 21 U.S.C. section 360bbb-3(b)(1), unless the authorization is terminated or revoked sooner. Performed at [facility], [address], [place], [place] [address]      Studies: MR LUMBAR SPINE WO CONTRAST  Result Date: 11/25/2019 CLINICAL DATA:  85 year old male with recent falls and low back pain, history of compression fractures. EXAM: MRI LUMBAR SPINE WITHOUT CONTRAST TECHNIQUE: Multiplanar, multisequence MR imaging of the lumbar spine was performed. No intravenous contrast was administered. COMPARISON:  Lumbar MRI 11/02/2017.  Thoracic spine MRI yesterday. FINDINGS: Segmentation: Normal, concordant with the thoracic spine numbering yesterday. Alignment:  Stable lumbar vertebral height and alignment since 2018. Vertebrae: T12 superior endplate compression fracture with moderate marrow edema throughout that vertebral body, as described yesterday. Chronic L1 and L4 compression fractures with previous augmentation. Mild L2 inferior endplate deformity is new since 2018 with mild associated marrow edema to the  left of midline (series 6, image 11). Minimal loss of height. L3 remains intact. There is moderate new superior endplate compression of L5 since 2018. Associated 30-40% loss of height. Moderate marrow edema along the superior endplate. Mild retropulsion of bone. The L5 pedicles and posterior elements appear to remain intact. Intact visible sacrum and SI joints. Conus medullaris and cauda equina: Conus extends to the T12-L1 level. No lower spinal cord or conus signal abnormality. Paraspinal and other soft tissues: Patchy and confluent new lumbar erector spinae muscle edema greater on the right (series 6, image 1) from the L2 through the L5 levels. No paraspinal fluid collection. Visible abdominal viscera appear stable since 2018, including diverticulosis of the large bowel. Disc levels: Chronic multifactorial lumbar spinal stenosis at L2-L3 and L3-L4 has not significantly changed since 2018. No significant increased spinal stenosis at L4-L5 despite mild retropulsion of the L5 posterosuperior endplate now. Chronic bilateral L4 foraminal stenosis also appears not significantly changed. IMPRESSION: 1. Moderate acute to subacute compression fracture of the L5 superior endplate. Minor retropulsion of bone and no complicating features. 2. Minimal acute to subacute inferior endplate compression of L2 on the left. 3. Acute to subacute T12 compression fracture as reported yesterday. 4. Chronic and previously augmented L1 and L4 compression fractures. 5. Nonspecific but possibly posttraumatic edema in the lumbar erector spinae muscles greater on the right. Electronically Signed   By: Odessa Fleming M.D.   On: 11/25/2019 12:44   US RENAL  Result Date: 11/25/2019 CLINICAL DATA:  Elevated creatinine EXAM: RENAL / URINARY TRACT ULTRASOUND COMPLETE COMPARISON:  None. FINDINGS: Right Kidney: Renal measurements: 10.7 x 5.4 x 5.3 cm = volume: 160 mL. Cortical thinning with mild increased echogenicity suggesting medical renal disease. No  hydronephrosis or other acute finding. Left Kidney: Renal measurements: 10.0 x 4.7 x 5.4 cm = volume: 133 mL. Similar increased echogenicity and cortical thinning compatible with medical renal disease. Anechoic cyst in the lower pole measures 1.7 cm. Bladder: Completely collapsed by Foley catheter. Other: None. IMPRESSION: Increased renal echogenicity with cortical thinning compatible with chronic medical renal disease. No acute finding or hydronephrosis. 1.7 cm left kidney lower pole cyst Electronically Signed   By: Judie Petit.  Shick M.D.   On: 11/25/2019 14:25    Scheduled Meds: .  finasteride  5 mg Oral Daily  . heparin  5,000 Units Subcutaneous Q8H  . insulin aspart  0-6 Units Subcutaneous TID WC  . insulin glargine  8 Units Subcutaneous QHS  . simvastatin  20 mg Oral QHS  . sodium chloride flush  3 mL Intravenous Q12H    Continuous Infusions: . sodium chloride 75 mL/hr at 11/25/19 1104  . cefTRIAXone (ROCEPHIN)  IV 1 g (11/25/19 1627)     Time spent: 35mins I have personally reviewed and interpreted on  11/26/2019 daily labs, tele strips, imagings as discussed above under date review session and assessment and plans.  I reviewed all nursing notes, pharmacy notes, consultant notes,  vitals, pertinent old records  I have discussed plan of care as described above with RN , patient and family on 11/26/2019   Albertine GratesFang Shellie Goettl MD, PhD, FACP  [facility] Pager [phone]. If 7PM-7AM, please contact night-coverage at [email or web address], password [email or web address] 11/26/2019, 8:01 AM  LOS: 2 days

## 2019-11-26 NOTE — Progress Notes (Signed)
Patient calm all morning however, he got agitated he is trying to walk but he is unable and high fall risk. Tried to call daughter to come at beside,she is unable, stated she is going to come at 5 pm. Will keep watching the patient.

## 2019-11-26 NOTE — Progress Notes (Addendum)
Patient also has a transfer orders to med surge floor. Verified/paged MD Florencia Reasons should we transfer this patient to the med surge. Per MD patient should stay in the same room.

## 2019-11-26 NOTE — Evaluation (Signed)
Occupational Therapy Evaluation Patient Details Name: Daniel Cooke MRN: [id number] DOB: 29-Dec-1923 Today's Date: 11/26/2019    History of Present Illness 85 y.o. male with medical history significant of hypertension, paroxysmal atrial fibrillation on Eliquis, BPH, chronic diastolic heart failure last EF 60 - 65%, and diabetes mellitus type 2.  Patient presents with complaints of weakness since rezum procedure performed on 12/7. Family reports h/o falls in past 3 weeks.  Dx of hyponatremia.   Clinical Impression   PTA patient reports independent with ADLs, mobility using RW, assist for IADLs, Admitted for above and limited by problem list below, including impaired balance, generalized weakness, back pain and decreased activity tolerance. Pt oriented x 3 today, requires re-orientation to situation, follows commands with increased time with min cueing for problem solving.  He is able to complete bed mobility with min assist, transfers with min assist +2, LB ADLs with mod assist and in room mobility with min assist using RW.  He was educated on safety, precautions, recommendations and back precautions for comfort.  He will benefit from continued OT services while admitted and after dc at [facility] level in order to optimize independence and safety with ADLs, mobility. If pt does not have 24/7 support, may need SNF.     Follow Up Recommendations  Home health OT;Supervision/Assistance - 24 hour    Equipment Recommendations  3 in 1 bedside commode    Recommendations for Other Services       Precautions / Restrictions Precautions Precautions: Fall Precaution Comments: educated on back precautions for comfort  Restrictions Weight Bearing Restrictions: No      Mobility Bed Mobility Overal bed mobility: Needs Assistance Bed Mobility: Rolling;Sidelying to Sit Rolling: Min guard Sidelying to sit: Min assist       General bed mobility comments: educated on log roll technique for pain mgmt,  min guard to roll and min assist to guide trunk to sitting with HOB elevated  Transfers Overall transfer level: Needs assistance Equipment used: Rolling walker (2 wheeled) Transfers: Sit to/from Stand Sit to Stand: Min assist;+2 physical assistance;+2 safety/equipment         General transfer comment: min assist +2 to power up and steady with cueing for hand placement and safety using RW     Balance Overall balance assessment: Needs assistance Sitting-balance support: No upper extremity supported;Feet supported Sitting balance-Leahy Scale: Fair     Standing balance support: Bilateral upper extremity supported;During functional activity Standing balance-Leahy Scale: Poor Standing balance comment: relaint on BUE and external support                           ADL either performed or assessed with clinical judgement   ADL Overall ADL's : Needs assistance/impaired     Grooming: Min guard;Standing   Upper Body Bathing: Sitting;Set up   Lower Body Bathing: Moderate assistance;Sit to/from stand   Upper Body Dressing : Set up;Sitting   Lower Body Dressing: Moderate assistance;Sit to/from stand Lower Body Dressing Details (indicate cue type and reason): requires assist for socks and min assist +2 sit to stand  Toilet Transfer: Minimal assistance;+2 for physical assistance;Ambulation;RW Toilet Transfer Details (indicate cue type and reason): simulated to recliner          Functional mobility during ADLs: Minimal assistance;+2 for physical assistance;+2 for safety/equipment;Rolling walker General ADL Comments: pt limited by weakness, pain, decreased activity tolerance     Vision   Vision Assessment?: No apparent visual deficits  Perception     Praxis      Pertinent Vitals/Pain Pain Assessment: Faces Faces Pain Scale: Hurts even more Pain Location: back, with movement Pain Descriptors / Indicators: Discomfort;Grimacing;Guarding Pain Intervention(s):  Monitored during session;Repositioned;Limited activity within patient's tolerance     Hand Dominance Right   Extremity/Trunk Assessment Upper Extremity Assessment Upper Extremity Assessment: Generalized weakness   Lower Extremity Assessment Lower Extremity Assessment: Defer to PT evaluation   Cervical / Trunk Assessment Cervical / Trunk Assessment: Other exceptions Cervical / Trunk Exceptions: s/p compression fractures   Communication Communication Communication: HOH   Cognition Arousal/Alertness: Awake/alert Behavior During Therapy: WFL for tasks assessed/performed Overall Cognitive Status: No family/caregiver present to determine baseline cognitive functioning Area of Impairment: Orientation;Problem solving;Following commands                 Orientation Level: Disoriented to;Situation     Following Commands: Follows one step commands consistently;Follows one step commands with increased time;Follows multi-step commands inconsistently     Problem Solving: Slow processing;Requires verbal cues General Comments: pleasant and cooperative, increased time for processing and requires verbal cues    General Comments  VSS    Exercises     Shoulder Instructions      Home Living Family/patient expects to be discharged to:: Private residence Living Arrangements: Alone Available Help at Discharge: Family;Available PRN/intermittently Type of Home: House Home Access: Stairs to enter Entergy CorporationEntrance Stairs-Number of Steps: 6 Entrance Stairs-Rails: None Home Layout: One level     Bathroom Shower/Tub: Producer, television/film/videoWalk-in shower   Bathroom Toilet: Standard Bathroom Accessibility: Yes   Home Equipment: Environmental consultantWalker - 2 wheels;Cane - single point          Prior Functioning/Environment Level of Independence: Independent with assistive device(s)        Comments: USes RW for ambulation. Does bird baths at sink. Gets help with IADLs. Falls at home        OT Problem List: Decreased  strength;Impaired balance (sitting and/or standing);Decreased cognition;Decreased safety awareness;Decreased knowledge of use of DME or AE;Decreased knowledge of precautions;Pain;Decreased activity tolerance      OT Treatment/Interventions: Self-care/ADL training;DME and/or AE instruction;Therapeutic activities;Balance training;Patient/family education;Cognitive remediation/compensation    OT Goals(Current goals can be found in the care plan section) Acute Rehab OT Goals Patient Stated Goal: to get home and feel better OT Goal Formulation: With patient Time For Goal Achievement: 12/10/19 Potential to Achieve Goals: Good  OT Frequency: Min 2X/week   Barriers to D/C: Decreased caregiver support          Co-evaluation PT/OT/SLP Co-Evaluation/Treatment: Yes Reason for Co-Treatment: For patient/therapist safety;To address functional/ADL transfers   OT goals addressed during session: ADL's and self-care      AM-PAC OT "6 Clicks" Daily Activity     Outcome Measure Help from another person eating meals?: None Help from another person taking care of personal grooming?: A Little Help from another person toileting, which includes using toliet, bedpan, or urinal?: A Little Help from another person bathing (including washing, rinsing, drying)?: A Lot Help from another person to put on and taking off regular upper body clothing?: A Little Help from another person to put on and taking off regular lower body clothing?: A Lot 6 Click Score: 17   End of Session Equipment Utilized During Treatment: Gait belt;Rolling walker Nurse Communication: Mobility status;Precautions  Activity Tolerance: Patient tolerated treatment well Patient left: in chair;with call bell/phone within reach;with chair alarm set  OT Visit Diagnosis: Other abnormalities of gait and mobility (R26.89);Muscle weakness (generalized) (M62.81);History  of falling (Z91.81);Pain Pain - part of body: (back)                Time:  2703-5009 OT Time Calculation (min): 22 min Charges:  OT General Charges $OT Visit: 1 Visit OT Evaluation $OT Eval Moderate Complexity: 1 Mod  Barry Brunner, OT Acute Rehabilitation Services Pager [phone] Office [phone]   Chancy Milroy 11/26/2019, 10:16 AM

## 2019-11-26 NOTE — Evaluation (Signed)
Physical Therapy Evaluation Patient Details Name: Daniel Cooke MRN: [id number] DOB: November 13, 1924 Today's Date: 11/26/2019   History of Present Illness  85 y.o. male with medical history significant of hypertension, paroxysmal atrial fibrillation on Eliquis, BPH, chronic diastolic heart failure last EF 60 - 65%, and diabetes mellitus type 2.  Patient presents with complaints of weakness since rezum procedure performed on 12/7. Family reports h/o falls in past 3 weeks. Noted to have multiple compression fxs at T12, L2, L5.  Dx of hyponatremia.  Clinical Impression  Patient presents with generalized weakness, back pain, impaired balance and impaired mobility s/p above. Pt lives alone and independent PTA however recently pt reports falls. Today, pt requires Min A of 2 to stand and min guard-Min A for gait training with use of RW. Due to weakness and back pain, pt continues to be a fall risk. Recommend 24/7 supervision for OOB mobility for safety. If not able to provide 24/7, may consider SNF. Education re: back precautions for comfort. Will follow acutely to maximize independence and mobility prior to return home.    Follow Up Recommendations Home health PT;Supervision for mobility/OOB;Supervision/Assistance - 24 hour    Equipment Recommendations  None recommended by PT    Recommendations for Other Services       Precautions / Restrictions Precautions Precautions: Fall Precaution Comments: educated on back precautions for comfort  Restrictions Weight Bearing Restrictions: No      Mobility  Bed Mobility               General bed mobility comments: Sitting EOB upon PT arrival.  Transfers Overall transfer level: Needs assistance Equipment used: Rolling walker (2 wheeled) Transfers: Sit to/from Stand Sit to Stand: Min assist;+2 physical assistance;+2 safety/equipment         General transfer comment: min assist +2 to power up and steady with cueing for hand placement and  safety using RW, slow to rise. transferred to chair post ambulation.  Ambulation/Gait Ambulation/Gait assistance: Min guard;Min assist Gait Distance (Feet): 70 Feet Assistive device: Rolling walker (2 wheeled) Gait Pattern/deviations: Step-through pattern;Decreased stride length;Drifts right/left Gait velocity: decreased   General Gait Details: Slow, unsteady gait with occasional Min A but mostly close Min guard for balance using RW.  Stairs            Wheelchair Mobility    Modified Rankin (Stroke Patients Only)       Balance Overall balance assessment: Needs assistance Sitting-balance support: No upper extremity supported;Feet supported Sitting balance-Leahy Scale: Fair     Standing balance support: Bilateral upper extremity supported;During functional activity Standing balance-Leahy Scale: Poor Standing balance comment: relaint on BUE and external support                             Pertinent Vitals/Pain Pain Assessment: Faces Faces Pain Scale: Hurts even more Pain Location: back, with movement Pain Descriptors / Indicators: Discomfort;Grimacing;Guarding Pain Intervention(s): Monitored during session;Repositioned;Limited activity within patient's tolerance    Home Living Family/patient expects to be discharged to:: Private residence Living Arrangements: Alone Available Help at Discharge: Family;Available PRN/intermittently Type of Home: House Home Access: Stairs to enter Entrance Stairs-Rails: None Entrance Stairs-Number of Steps: 6 Home Layout: One level Home Equipment: Walker - 2 wheels;Cane - single point      Prior Function Level of Independence: Independent with assistive device(s)         Comments: USes RW for ambulation. Does bird baths at sink. Gets help  with IADLs. Falls at home     Hand Dominance   Dominant Hand: Right    Extremity/Trunk Assessment   Upper Extremity Assessment Upper Extremity Assessment: Defer to OT  evaluation    Lower Extremity Assessment Lower Extremity Assessment: Generalized weakness    Cervical / Trunk Assessment Cervical / Trunk Assessment: Other exceptions Cervical / Trunk Exceptions: s/p compression fractures  Communication   Communication: HOH  Cognition Arousal/Alertness: Awake/alert Behavior During Therapy: WFL for tasks assessed/performed Overall Cognitive Status: No family/caregiver present to determine baseline cognitive functioning Area of Impairment: Orientation;Problem solving;Following commands                 Orientation Level: Disoriented to;Situation     Following Commands: Follows one step commands consistently;Follows one step commands with increased time;Follows multi-step commands inconsistently     Problem Solving: Slow processing;Requires verbal cues General Comments: pleasant and cooperative, increased time for processing and requires verbal cues and repetition at times. HOH      General Comments General comments (skin integrity, edema, etc.): VSS    Exercises     Assessment/Plan    PT Assessment Patient needs continued PT services  PT Problem List Decreased strength;Decreased mobility;Pain;Decreased balance;Decreased knowledge of precautions       PT Treatment Interventions Therapeutic activities;Gait training;Therapeutic exercise;Patient/family education;Balance training;Functional mobility training;DME instruction    PT Goals (Current goals can be found in the Care Plan section)  Acute Rehab PT Goals Patient Stated Goal: to get home and feel better PT Goal Formulation: With patient Time For Goal Achievement: 12/10/19 Potential to Achieve Goals: Good    Frequency Min 3X/week   Barriers to discharge Decreased caregiver support home alone    Co-evaluation PT/OT/SLP Co-Evaluation/Treatment: Yes Reason for Co-Treatment: For patient/therapist safety;To address functional/ADL transfers PT goals addressed during session:  Mobility/safety with mobility;Balance         AM-PAC PT "6 Clicks" Mobility  Outcome Measure Help needed turning from your back to your side while in a flat bed without using bedrails?: A Little Help needed moving from lying on your back to sitting on the side of a flat bed without using bedrails?: A Little Help needed moving to and from a bed to a chair (including a wheelchair)?: A Little Help needed standing up from a chair using your arms (e.g., wheelchair or bedside chair)?: A Lot Help needed to walk in hospital room?: A Little Help needed climbing 3-5 steps with a railing? : A Lot 6 Click Score: 16    End of Session Equipment Utilized During Treatment: Gait belt Activity Tolerance: Patient tolerated treatment well Patient left: in chair;with call bell/phone within reach;with chair alarm set Nurse Communication: Mobility status PT Visit Diagnosis: Pain;Difficulty in walking, not elsewhere classified (R26.2);Unsteadiness on feet (R26.81);Muscle weakness (generalized) (M62.81) Pain - part of body: (back)    Time: 9563-8756 PT Time Calculation (min) (ACUTE ONLY): 15 min   Charges:   PT Evaluation $PT Eval Moderate Complexity: 1 Mod          Marisa Severin, PT, DPT Acute Rehabilitation Services Pager [phone] Office [phone]      Marguarite Arbour A [place] 11/26/2019, 1:23 PM

## 2019-11-26 NOTE — Progress Notes (Signed)
Cardiac teli d/c per MD.

## 2019-11-27 LAB — BASIC METABOLIC PANEL
Anion gap: 10 (ref 5–15)
BUN: 27 mg/dL — ABNORMAL HIGH (ref 8–23)
CO2: 25 mmol/L (ref 22–32)
Calcium: 9.3 mg/dL (ref 8.9–10.3)
Chloride: 105 mmol/L (ref 98–111)
Creatinine, Ser: 1.49 mg/dL — ABNORMAL HIGH (ref 0.61–1.24)
GFR calc Af Amer: 46 mL/min — ABNORMAL LOW (ref >60)
GFR calc non Af Amer: 40 mL/min — ABNORMAL LOW (ref >60)
Glucose, Bld: 95 mg/dL (ref 70–99)
Potassium: 3.9 mmol/L (ref 3.5–5.1)
Sodium: 140 mmol/L (ref 135–145)

## 2019-11-27 LAB — GLUCOSE, CAPILLARY
Glucose-Capillary: 78 mg/dL (ref 70–99)
Glucose-Capillary: 208 mg/dL — ABNORMAL HIGH (ref 70–99)

## 2019-11-27 MED ORDER — INSULIN GLARGINE 100 UNIT/ML ~~LOC~~ SOLN
4.0000 [IU] | Freq: Every day | SUBCUTANEOUS | Status: DC
  Filled 2019-11-27: qty 0.04

## 2019-11-27 MED ORDER — LANTUS SOLOSTAR 100 UNIT/ML ~~LOC~~ SOPN: 4.0000 [IU] | PEN_INJECTOR | Freq: Every day | SUBCUTANEOUS | 0 refills | Status: DC

## 2019-11-27 MED ORDER — APIXABAN 2.5 MG PO TABS
2.5000 mg | ORAL_TABLET | Freq: Two times a day (BID) | ORAL | Status: DC
  Administered 2019-11-27: 2.5 mg via ORAL
  Filled 2019-11-27: qty 1

## 2019-11-27 NOTE — TOC Transition Note (Signed)
Transition of Care [facility]) - CM/SW Discharge Note   Patient Details  Name: Daniel Cooke MRN: [id number] Date of Birth: August 07, 1924  Transition of Care [facility] CM/SW Contact:  Zenon Mayo, RN Phone Number: 11/27/2019, 1:20 PM   Clinical Narrative:    NCM was informed that daughter [name] would like NCM to contact her about the [facility] services.  NCM contacted [name] , offered choice, she states she does not have a preference as long as they cover thru his insurance.  NCM made referral to [facility] with Alvis Lemmings for [facility], HHPT, HHAIDE and Education officer, museum.  He states they can take the referral and soc will begin 24 to 48 hrs post dc.    Final next level of care: [place] Barriers to Discharge: No Barriers Identified   Patient Goals and CMS Choice Patient states their goals for this hospitalization and ongoing recovery are:: go home CMS Medicare.gov Compare Post Acute Care list provided to:: Patient Represenative (must comment)(daughter [name]) Choice offered to / list presented to : Adult Children  Discharge Placement                       Discharge Plan and Services                DME Arranged: (NA)         HH Arranged: RN, PT, Nurse's Aide, Social Work [employer] Agency: [place] Date [facility] Agency Contacted: 11/27/19 Time [place]: 35 Representative spoke with at [place]: cory  Social Determinants of Health ([place]) Interventions     Readmission Risk Interventions No flowsheet data found.

## 2019-11-27 NOTE — Progress Notes (Signed)
Occupational Therapy Treatment Patient Details Name: Daniel Cooke MRN: [id number] DOB: 1924/05/11 Today's Date: 11/27/2019    History of present illness 85 y.o. male with medical history significant of hypertension, paroxysmal atrial fibrillation on Eliquis, BPH, chronic diastolic heart failure last EF 60 - 65%, and diabetes mellitus type 2.  Patient presents with complaints of weakness since rezum procedure performed on 12/7. Family reports h/o falls in past 3 weeks. Noted to have multiple compression fxs at T12, L2, L5.  Dx of hyponatremia.   OT comments  Pt toileting and standing at sink with min guard assist. Able to manage belt and zipper of jeans with min guard assist for balance.   Follow Up Recommendations  Home health OT;Supervision/Assistance - 24 hour    Equipment Recommendations  3 in 1 bedside commode    Recommendations for Other Services      Precautions / Restrictions Precautions Precautions: Fall Precaution Comments: educated on back precautions for comfort  Restrictions Weight Bearing Restrictions: No       Mobility Bed Mobility               General bed mobility comments: pt received in chair  Transfers Overall transfer level: Needs assistance Equipment used: Rolling walker (2 wheeled) Transfers: Sit to/from Stand Sit to Stand: Min guard         General transfer comment: slow to rise, assist to control descent to toilet    Balance Overall balance assessment: Needs assistance Sitting-balance support: No upper extremity supported;Feet supported Sitting balance-Leahy Scale: Fair     Standing balance support: Bilateral upper extremity supported;During functional activity Standing balance-Leahy Scale: Poor Standing balance comment: reliant on RW for ambulation, min guard for static standing                           ADL either performed or assessed with clinical judgement   ADL Overall ADL's : Needs assistance/impaired      Grooming: Min guard;Standing;Wash/dry hands               Lower Body Dressing: Min guard(standing) Lower Body Dressing Details (indicate cue type and reason): needs stability when standing to manage belt Toilet Transfer: Min guard;Ambulation;RW   Toileting- Architect and Hygiene: Min guard;Sit to/from stand       Functional mobility during ADLs: Health and safety inspector     Praxis      Cognition Arousal/Alertness: Awake/alert Behavior During Therapy: WFL for tasks assessed/performed Overall Cognitive Status: Within Functional Limits for tasks assessed                                 General Comments: reports his daughter lives next door and will stay with him as needed        Exercises     Shoulder Instructions       General Comments VSS    Pertinent Vitals/ Pain       Pain Assessment: Faces Faces Pain Scale: Hurts little more Pain Location: back with stand to sit Pain Descriptors / Indicators: Moaning Pain Intervention(s): Monitored during session;Repositioned  Home Living  Prior Functioning/Environment              Frequency  Min 2X/week        Progress Toward Goals  OT Goals(current goals can now be found in the care plan section)  Progress towards OT goals: Progressing toward goals  Acute Rehab OT Goals Patient Stated Goal: to get home and feel better OT Goal Formulation: With patient Time For Goal Achievement: 12/10/19 Potential to Achieve Goals: Good  Plan Discharge plan remains appropriate    Co-evaluation                 AM-PAC OT "6 Clicks" Daily Activity     Outcome Measure   Help from another person eating meals?: None Help from another person taking care of personal grooming?: A Little Help from another person toileting, which includes using toliet, bedpan, or urinal?: A Little Help from another  person bathing (including washing, rinsing, drying)?: A Lot Help from another person to put on and taking off regular upper body clothing?: A Little Help from another person to put on and taking off regular lower body clothing?: A Lot 6 Click Score: 17    End of Session Equipment Utilized During Treatment: Gait belt;Rolling walker  OT Visit Diagnosis: Other abnormalities of gait and mobility (R26.89);Muscle weakness (generalized) (M62.81);History of falling (Z91.81);Pain   Activity Tolerance Patient tolerated treatment well   Patient Left in chair;with call bell/phone within reach;with chair alarm set   Nurse Communication          Time: 1420-1436 OT Time Calculation (min): 16 min  Charges: OT General Charges $OT Visit: 1 Visit OT Treatments $Self Care/Home Management : 8-22 mins  Nestor Lewandowsky, OTR/L Acute Rehabilitation Services Pager: [phone] Office: [phone]  Malka So 11/27/2019, 2:39 PM

## 2019-11-27 NOTE — Care Management Important Message (Signed)
Important Message  Patient Details  Name: Daniel Cooke MRN: [id number] Date of Birth: 30-Jun-1924   Medicare Important Message Given:  Yes     Shyla Gayheart 11/27/2019, 1:26 PM

## 2019-11-27 NOTE — Progress Notes (Signed)
Pt daughter [name] called and notified of pt DC and update. [name] said pt son in law will come pick up pt. Delia Heady RN

## 2019-11-27 NOTE — Progress Notes (Signed)
Physical Therapy Treatment Patient Details Name: Daniel Cooke MRN: [id number] DOB: 1924/11/05 Today's Date: 11/27/2019    History of Present Illness 85 y.o. male with medical history significant of hypertension, paroxysmal atrial fibrillation on Eliquis, BPH, chronic diastolic heart failure last EF 60 - 65%, and diabetes mellitus type 2.  Patient presents with complaints of weakness since rezum procedure performed on 12/7. Family reports h/o falls in past 3 weeks. Noted to have multiple compression fxs at T12, L2, L5.  Dx of hyponatremia.    PT Comments    Pt progressing well towards goals. Pt with improved ambulation tolerance/endurance today and was able to complete stair negotiation. Pt con't to require min guard and safety with walker management to prevent fall. Con't to recommend 24/7 assist upon d/c as patient remains at increased falls risk and demos decreased insight to safety. Acute PT to cont to follow.    Follow Up Recommendations  Home health PT;Supervision for mobility/OOB;Supervision/Assistance - 24 hour     Equipment Recommendations  None recommended by PT    Recommendations for Other Services       Precautions / Restrictions Precautions Precautions: Fall Restrictions Weight Bearing Restrictions: No    Mobility  Bed Mobility               General bed mobility comments: pt up in chair upon PT arrival  Transfers Overall transfer level: Needs assistance Equipment used: Rolling walker (2 wheeled) Transfers: Sit to/from Stand Sit to Stand: Min guard         General transfer comment: min guard for safety/steady during transition of hands, no physical assist, pt pushed up from arm rests and didn't pull up on RW, pt with controlled descent back into chair   Ambulation/Gait Ambulation/Gait assistance: Min assist Gait Distance (Feet): 150 Feet Assistive device: Rolling walker (2 wheeled) Gait Pattern/deviations: Step-through pattern;Decreased stride  length;Trunk flexed Gait velocity: dec   General Gait Details: pt unable to achieve full upright posture due to back, pt requires max verbal cues to stay in walker during turns as pt trips over walker requiring minA to prevent fall   Stairs Stairs: Yes Stairs assistance: Min assist Stair Management: One rail Right;Step to pattern;Backwards Number of Stairs: 5 General stair comments: increased time, verbal cues for safety and sideways technique   Wheelchair Mobility    Modified Rankin (Stroke Patients Only)       Balance Overall balance assessment: Needs assistance Sitting-balance support: No upper extremity supported;Feet supported Sitting balance-Leahy Scale: Fair     Standing balance support: Bilateral upper extremity supported;During functional activity Standing balance-Leahy Scale: Poor Standing balance comment: relaint on BUE and external support                            Cognition Arousal/Alertness: Awake/alert Behavior During Therapy: WFL for tasks assessed/performed Overall Cognitive Status: Within Functional Limits for tasks assessed                                 General Comments: states he has a friend that will stay with him or his daughter      Exercises      General Comments General comments (skin integrity, edema, etc.): VSS      Pertinent Vitals/Pain Pain Assessment: No/denies pain    Home Living  Prior Function            PT Goals (current goals can now be found in the care plan section) Progress towards PT goals: Progressing toward goals    Frequency    Min 3X/week      PT Plan Current plan remains appropriate    Co-evaluation              AM-PAC PT "6 Clicks" Mobility   Outcome Measure  Help needed turning from your back to your side while in a flat bed without using bedrails?: A Little Help needed moving from lying on your back to sitting on the side of a flat  bed without using bedrails?: A Little Help needed moving to and from a bed to a chair (including a wheelchair)?: A Little Help needed standing up from a chair using your arms (e.g., wheelchair or bedside chair)?: A Little Help needed to walk in hospital room?: A Little Help needed climbing 3-5 steps with a railing? : A Little 6 Click Score: 18    End of Session Equipment Utilized During Treatment: Gait belt Activity Tolerance: Patient tolerated treatment well Patient left: in chair;with call bell/phone within reach;with chair alarm set Nurse Communication: Mobility status PT Visit Diagnosis: Difficulty in walking, not elsewhere classified (R26.2);Unsteadiness on feet (R26.81);Muscle weakness (generalized) (M62.81)     Time: 2080-2233 PT Time Calculation (min) (ACUTE ONLY): 21 min  Charges:  $Gait Training: 8-22 mins                     Kittie Plater, PT, DPT Acute Rehabilitation Services Pager #: [phone] Office #: [phone]    Berline Lopes 11/27/2019, 12:22 PM

## 2019-11-27 NOTE — Progress Notes (Signed)
Pt discharge education and instructions completed with pt and he voices understanding. Pt IV removed. Pt handed his discharge education instructions and awaiting on son in law to transport off to disposition. Will closely monitor till pt picked up. P. Angelica Pou RN

## 2019-11-27 NOTE — Discharge Instructions (Signed)

## 2019-11-27 NOTE — Discharge Summary (Signed)
Physician Discharge Summary  Daniel Cooke PJA:[id number] DOB: 1924-01-28 DOA: 11/24/2019  PCP: London Pepper, MD  Admit date: 11/24/2019 Discharge date: 11/27/2019  Admitted From: home Disposition:  home  Recommendations for Outpatient Follow-up:  1. Follow up with PCP in 1-2 weeks 2. Please obtain BMP/CBC in one week 3. Please follow up on the following pending results:  Home Health:yes  Equipment/Devices: none  Discharge Condition: Stable CODE STATUS: FULL Diet recommendation: Heart Healthy, diabetic  Brief/Interim Summary: Discharge diagnoses  85yom who had been more weak with decreased appetite per family. They also report recent history of falls within the last 3weeks. He been complaining of significant mid to lower back pain and with any kind of movement. Daughter reports he had been seen by Dr.Stern and they had recommended patient have MRI of the back for further evaluation fractures of the T and L-spine. He has had previous injections without relief of symptoms. CODE STATUS was discussed with patient and his daughter over the phone would like patient to remain a full code. Per ED triage intake: Pateint brought in by [facility] from home for generalized weakness. Pt used his home BP monitor and was hypotensive. Pt CBG 313. Per family pt has had poor oral intake over the last few days. Pt had a urinary catheter placed on Monday. Per EMS pt has been off of his blood thinners for his procedure on Monday. covid screening negative Pt was admitted. Treated w ivf, iv antobiotics.  had hypoglycemia Seen by PT no skilled needs Seen by neurosurgery and advised to procedure and medical management,osteoporosis treatment. Patient has a Foley catheter post procedure and will be continued and follow-up with Dr. Urology. POC discussed patient's daughter and in agreement with return to home with home and services today. Declined SNF.  Assessment/Plan:  AKI/CKD III;-Patient presents  with creatinine elevated up to 2.47 with BUN 47.Baseline creatinine previously noted to be around 1.6.Given reports of decreased appetite and generalized weakness suspect prerenal cause of symptoms, home medication Lasix held since admission -ua with moderate leukocyte, rare bacteria, negative nitrite, negative protein.  Urine culture with insignificant growth, blood culture no growth to date -Renal ultrasound "increased renal echogenicity with cortical thinning compatible with chronic medical renal disease.  No acute finding or hydronephrosis. 1.7 cm left kidney lower pole cyst -There is no fever ,no leukocytosis, however,  due to recent urologic procedure on 12/7, patient is started on Rocephin empirically on admission, he received 3 dose of Rocephin, clinically he is improving will DC antibiotics. -cr improving, now close to baseline , DC  IV fluids, encourage oral intake, renal dosing meds Recent Labs  Lab 11/24/19 1300 11/25/19 0331 11/26/19 0413 11/27/19 0538  BUN 47* 43* 31* 27*  CREATININE 2.47* 2.11* 1.66* 1.49*   BPH: Patient status postrezum procedure on 12/7with [facility].Continue Proscar - Dr Xu-discussed with urologist Dr. Alinda Money  who recommended keep the current Foley catheter in , donot switch foley,  Dr. Alinda Money will set up follow-up with Dr. Gloriann Loan in the office. -daughter [name] will contact Dr bell's office on Monday , she would like urologist to evaluate the patient while he is in the hospital Hypokalemia: resolved PAF: in atrial fibrillation but rate controlled.He had urological procedure on 12/7, He was told to hold anticoagulation 2 days before and advised to resume on 12/14. -I have reviewed recent EP notes from December 2, EP plan to do DCCV after 3 weeks of uninterrupted anticoagulation.Patient clinically is improving, off  DC telemetry .patient is  to follow-up with EP after discharge.  Back painwith history of falls,generalized  weakness:Acute.-Patienthas been falling over the last 3 weeks. Reportedly has fractures of the T and L-spine for which he was seen by Dr. Sarita Haver recommended MRI of the back.--PT/OT to evaluate and treat.  Evaluated by neurosurgery and advised outpatient follow-up on Wednesday no need for acute intervention at this time. Also discussed with Dr. Rush Farmer RN and and he is going to arrange for follow-up on Wednesday outpatient.   MRI thoracic spine:  "Mild compression fractures T2, T3, T4, T5, T12. All these appear to be recent benign fractures. Chronic severe fracture of L1 with kyphoplasty cement. Negative for spinal stenosis." MRI lumber spine: 1. Moderate acute to subacute compression fracture of the L5 superior endplate. Minor retropulsion of bone and no complicating features. 2. Minimal acute to subacute inferior endplate compression of L2 on the left. 3. Acute to subacute T12 compression fracture as reported yesterday. 4. Chronic and previously augmented L1 and L4 compression fractures. 5. Nonspecific but possibly posttraumatic edema in the lumbar erector spinae muscles greater on the right  Diabetes mellitus type 2 with hyperglycemia on presentation and hypoglycemia event while in the hospital:  Blood sugar has remained fairly stable in 200 this morning earlier over 75.  Advised to continue only 4 units Lantus at bedtime and slowly go up on home dose normally taking in 12 to 14 units.  Can take Humalog premeal.  Advised to monitor blood sugar 4-5 times a day and follow-up with PCP.  Daughter has verbalized understanding.Hypoglycemia event likely due to aki and poor oral intake  Hearing loss bilaterally: Patient has hearing aids in place  FTT: Per daughter patient has been independent,  patient still goes to work on the weekend.  Patient is not interested in skilled nursing facility or assisted living PT OT eval and HHC.  Code Status: full  Family Communication: patient ,  daughter [name] over the phone today Disposition Plan: hom w HHC  Consultants:  Urologist Dr. Laverle Patter over the phone on December 12  Neurosurgery Dr Maurice Small  Procedures:  None  Antibiotics:  Rocephin x3 days   Discharge Diagnoses:  Principal Problem:   Acute kidney injury superimposed on chronic kidney disease (HCC) Active Problems:   HLD (hyperlipidemia)   Type II diabetes mellitus (HCC)   AF (paroxysmal atrial fibrillation) (HCC)   Hard of hearing   Sensorineural hearing loss (SNHL) of both ears   Complicated urinary tract infection   Back pain   Generalized weakness    Discharge Instructions  Discharge Instructions    Diet - low sodium heart healthy   Complete by: As directed    Discharge instructions   Complete by: As directed    Please call call MD or return to ER for similar or worsening recurring problem that brought you to hospital or if any fever,nausea/vomiting,abdominal pain, uncontrolled pain, chest pain,  shortness of breath or any other alarming symptoms.  Please follow-up your doctor as instructed in a week time and call the office for appointment.  Please avoid alcohol, smoking, or any other illicit substance and maintain healthy habits including taking your regular medications as prescribed.  You were cared for by a hospitalist during your hospital stay. If you have any questions about your discharge medications or the care you received while you were in the hospital after you are discharged, you can call the unit and ask to speak with the hospitalist on call if the hospitalist that took care  of you is not available.  Once you are discharged, your primary care physician will handle any further medical issues. Please note that NO REFILLS for any discharge medications will be authorized once you are discharged, as it is imperative that you return to your primary care physician (or establish a relationship with a primary care physician if you do not  have one) for your aftercare needs so that they can reassess your need for medications and monitor your lab values  Check blood sugar 3 times a day and bedtime at home. If blood sugar running above 200 less than 70 please call your MD to adjust insulin. If blood sugars running less 100 do not use insulin and call MD. If you noticed signs and symptoms of hypoglycemia or low blood sugar like jitteriness, confusion, thirst, tremor, sweating- Check blood sugar, drink sugary drink/biscuits/sweets to increase sugar level and call MD or return to ER. Increase your insulin to home dose as blood sugar starts to go up   Increase activity slowly   Complete by: As directed    Increase activity slowly   Complete by: As directed      Allergies as of 11/27/2019      Reactions   Codeine Nausea And Vomiting, Other (See Comments)   MAKES ME CRAZY   Oxycodone Nausea And Vomiting, Other (See Comments)   MAKES ME CRAZY   Other Other (See Comments)   All narcotic pain medicine makes him crazy.       Medication List    TAKE these medications   acetaminophen 325 MG tablet Commonly known as: TYLENOL Take 325 mg by mouth every 6 (six) hours as needed for mild pain.   apixaban 2.5 MG Tabs tablet Commonly known as: Eliquis Take 1 tablet (2.5 mg total) by mouth 2 (two) times daily.   BD Pen Needle Nano U/F 32G X 4 MM Misc Generic drug: Insulin Pen Needle USE AS DIRECTED ONCE DAILY FOR 30 DAYS   CALCIUM PO Take 1 tablet by mouth daily.   finasteride 5 MG tablet Commonly known as: PROSCAR Take 5 mg by mouth daily.   furosemide 40 MG tablet Commonly known as: LASIX Take 1/2 (one-half) tablet by mouth once daily What changed: See the new instructions.   HumaLOG KwikPen 100 UNIT/ML KwikPen Generic drug: insulin lispro Inject 2 Units into the skin daily with supper.   Lantus SoloStar 100 UNIT/ML Solostar Pen Generic drug: Insulin Glargine Inject 4 Units into the skin at bedtime. What changed:  how much to take   OneTouch Verio test strip Generic drug: glucose blood USE TO TEST YOUR BLOOD SUGAR THREE TIMES DAILY.   simvastatin 20 MG tablet Commonly known as: ZOCOR Take 20 mg by mouth at bedtime.   traMADol 50 MG tablet Commonly known as: ULTRAM Take 100 mg by mouth every 6 (six) hours as needed for pain.   VITAMIN D3 PO Take 1 tablet by mouth daily.      Follow-up Information    Farris Has, MD.   Specialty: Family Medicine Contact information: [address] [place] [address] [phone]        Regan Lemming, MD .   Specialty: Cardiology Contact information: [address] [place] [address] [phone]        Maeola Harman, MD Follow up.   Specialty: Neurosurgery Contact information: [address] [place] [address] [phone]        Call Alvester Morin,  Jannett Celestine, MD.   Specialty: Urology Contact information: [address] [place] [address] [phone]        Shon Hale, MD. Go on 12/06/2019.   Specialty: Family Medicine Why: @10 :30am Contact information: [address] [place] [phone]        [facility], [facility] Follow up.   Specialty: Home Health Services Why: HHRN,HHPT, HHAIDE, Social Worker Contact information: [address] [place] [phone]          Allergies  Allergen Reactions  . Codeine Nausea And Vomiting and Other (See Comments)    MAKES ME CRAZY  . Oxycodone Nausea And Vomiting and Other (See Comments)    MAKES ME CRAZY  . Other Other (See Comments)    All narcotic pain medicine makes him crazy.    Procedures/Studies: MR THORACIC SPINE WO CONTRAST  Result Date: 11/24/2019 CLINICAL DATA:  Back pain.  Rule out fracture. EXAM: MRI THORACIC SPINE WITHOUT CONTRAST TECHNIQUE: Multiplanar, multisequence MR imaging of the thoracic spine was performed. No intravenous  contrast was administered. COMPARISON:  None. FINDINGS: Alignment:  Normal Vertebrae: Mild compression fractures T2, T3, T4, T5 with bone marrow edema indicating recent fractures. Mild compression fracture T12 vertebral body with diffuse bone marrow edema consistent with recent fracture. Chronic fracture L1 with kyphoplasty cement. Negative for mass lesion. Cord:  Normal cord signal.  No cord lesion or cord compression Paraspinal and other soft tissues: Negative for paraspinous mass or edema. Disc levels: Mild disc degeneration throughout the thoracic spine. No significant spinal stenosis. Small left-sided disc protrusion T10-11. IMPRESSION: Mild compression fractures T2, T3, T4, T5, T12. All these appear to be recent benign fractures. Chronic severe fracture of L1 with kyphoplasty cement. Negative for spinal stenosis. Electronically Signed   By: 14/10/2019 M.D.   On: 11/24/2019 20:34   MR LUMBAR SPINE WO CONTRAST  Result Date: 11/25/2019 CLINICAL DATA:  85 year old male with recent falls and low back pain, history of compression fractures. EXAM: MRI LUMBAR SPINE WITHOUT CONTRAST TECHNIQUE: Multiplanar, multisequence MR imaging of the lumbar spine was performed. No intravenous contrast was administered. COMPARISON:  Lumbar MRI 11/02/2017.  Thoracic spine MRI yesterday. FINDINGS: Segmentation: Normal, concordant with the thoracic spine numbering yesterday. Alignment:  Stable lumbar vertebral height and alignment since 2018. Vertebrae: T12 superior endplate compression fracture with moderate marrow edema throughout that vertebral body, as described yesterday. Chronic L1 and L4 compression fractures with previous augmentation. Mild L2 inferior endplate deformity is new since 2018 with mild associated marrow edema to the left of midline (series 6, image 11). Minimal loss of height. L3 remains intact. There is moderate new superior endplate compression of L5 since 2018. Associated 30-40% loss of height. Moderate  marrow edema along the superior endplate. Mild retropulsion of bone. The L5 pedicles and posterior elements appear to remain intact. Intact visible sacrum and SI joints. Conus medullaris and cauda equina: Conus extends to the T12-L1 level. No lower spinal cord or conus signal abnormality. Paraspinal and other soft tissues: Patchy and confluent new lumbar erector spinae muscle edema greater on the right (series 6, image 1) from the L2 through the L5 levels. No paraspinal fluid collection. Visible abdominal viscera appear stable since 2018, including diverticulosis of the large bowel. Disc levels: Chronic multifactorial lumbar spinal stenosis at L2-L3 and L3-L4 has not significantly changed since 2018. No significant increased spinal stenosis at L4-L5 despite mild retropulsion of the L5 posterosuperior endplate now. Chronic bilateral  L4 foraminal stenosis also appears not significantly changed. IMPRESSION: 1. Moderate acute to subacute compression fracture of the L5 superior endplate. Minor retropulsion of bone and no complicating features. 2. Minimal acute to subacute inferior endplate compression of L2 on the left. 3. Acute to subacute T12 compression fracture as reported yesterday. 4. Chronic and previously augmented L1 and L4 compression fractures. 5. Nonspecific but possibly posttraumatic edema in the lumbar erector spinae muscles greater on the right. Electronically Signed   By: Odessa Fleming M.D.   On: 11/25/2019 12:44   US RENAL  Result Date: 11/25/2019 CLINICAL DATA:  Elevated creatinine EXAM: RENAL / URINARY TRACT ULTRASOUND COMPLETE COMPARISON:  None. FINDINGS: Right Kidney: Renal measurements: 10.7 x 5.4 x 5.3 cm = volume: 160 mL. Cortical thinning with mild increased echogenicity suggesting medical renal disease. No hydronephrosis or other acute finding. Left Kidney: Renal measurements: 10.0 x 4.7 x 5.4 cm = volume: 133 mL. Similar increased echogenicity and cortical thinning compatible with medical renal  disease. Anechoic cyst in the lower pole measures 1.7 cm. Bladder: Completely collapsed by Foley catheter. Other: None. IMPRESSION: Increased renal echogenicity with cortical thinning compatible with chronic medical renal disease. No acute finding or hydronephrosis. 1.7 cm left kidney lower pole cyst Electronically Signed   By: Judie Petit.  Shick M.D.   On: 11/25/2019 14:25   DG Chest Port 1 View  Result Date: 11/24/2019 CLINICAL DATA:  Generalized weakness. EXAM: PORTABLE CHEST 1 VIEW COMPARISON:  November 25, 2016. FINDINGS: The heart size and mediastinal contours are within normal limits. Both lungs are clear. The visualized skeletal structures are unremarkable. IMPRESSION: No active disease. Electronically Signed   By: Lupita Raider M.D.   On: 11/24/2019 13:22   Subjective: Resting well denies any back pain.  Discharge Exam: Vitals:   11/27/19 0628 11/27/19 1257  BP: 122/63 127/79  Pulse: 71 81  Resp: 18 18  Temp: 98 F (36.7 C) 98 F (36.7 C)  SpO2: 98% 100%   Vitals:   11/26/19 1127 11/26/19 2027 11/27/19 0628 11/27/19 1257  BP: (!) 111/58 131/75 122/63 127/79  Pulse: 69 82 71 81  Resp:  Temp: 97.7 F (36.5 C) 98 F (36.7 C) 98 F (36.7 C) 98 F (36.7 C)  TempSrc: Oral Oral  Oral  SpO2: 97% 96% 98% 100%  Weight:   83.9 kg   Height:        General: Pt is alert, awake, not in acute distress Cardiovascular: RRR, S1/S2 +, no rubs, no gallops Respiratory: CTA bilaterally, no wheezing, no rhonchi Abdominal: Soft, NT, ND, bowel sounds + Extremities: no edema, no cyanosis   The results of significant diagnostics from this hospitalization (including imaging, microbiology, ancillary and laboratory) are listed below for reference.     Microbiology: Recent Results (from the past 240 hour(s))  Urine culture     Status: Abnormal   Collection Time: 11/24/19  1:33 PM   Specimen: Urine, Catheterized  Result Value Ref Range Status   Specimen Description URINE, CATHETERIZED   Final   Special Requests NONE  Final   Culture (A)  Final    <10,000 COLONIES/mL INSIGNIFICANT GROWTH Performed at [facility], [address], [place], [place] [address]    Report Status 11/25/2019 FINAL  Final  Culture, blood (routine x 2)     Status: None (Preliminary result)   Collection Time: 11/24/19  3:15 PM   Specimen: BLOOD  Result Value Ref Range Status   Specimen  Description BLOOD RIGHT ANTECUBITAL  Final   Special Requests   Final    BOTTLES DRAWN AEROBIC AND ANAEROBIC Blood Culture results may not be optimal due to an excessive volume of blood received in culture bottles   Culture   Final    NO GROWTH 3 DAYS Performed at [facility], [address], [place], [place] [address]    Report Status PENDING  Incomplete  Culture, blood (routine x 2)     Status: None (Preliminary result)   Collection Time: 11/24/19  3:20 PM   Specimen: BLOOD RIGHT WRIST  Result Value Ref Range Status   Specimen Description BLOOD RIGHT WRIST  Final   Special Requests   Final    BOTTLES DRAWN AEROBIC ONLY Blood Culture results may not be optimal due to an inadequate volume of blood received in culture bottles   Culture   Final    NO GROWTH 3 DAYS Performed at [facility], [address] [place], [place] [address]    Report Status PENDING  Incomplete  SARS CORONAVIRUS 2 (TAT 6-24 HRS) Nasopharyngeal Nasopharyngeal Swab     Status: None   Collection Time: 11/24/19  3:27 PM   Specimen: Nasopharyngeal Swab  Result Value Ref Range Status   SARS Coronavirus 2 NEGATIVE NEGATIVE Final    Comment: (NOTE) SARS-CoV-2 target nucleic acids are NOT DETECTED. The SARS-CoV-2 RNA is generally detectable in upper and lower respiratory specimens during the acute phase of infection. Negative results do not preclude SARS-CoV-2 infection, do not rule out co-infections with other pathogens, and should not be used as the sole basis for treatment or other patient management  decisions. Negative results must be combined with clinical observations, patient history, and epidemiological information. The expected result is Negative. Fact Sheet for Patients: HairSlick.no Fact Sheet for Healthcare Providers: quierodirigir.com This test is not yet approved or cleared by the [place] FDA and  has been authorized for detection and/or diagnosis of SARS-CoV-2 by FDA under an Emergency Use Authorization (EUA). This EUA will remain  in effect (meaning this test can be used) for the duration of the COVID-19 declaration under Section 56 4(b)(1) of the Act, 21 U.S.C. section 360bbb-3(b)(1), unless the authorization is terminated or revoked sooner. Performed at [facility], [address] [place], [place] [address]      Labs: BNP (last 3 results) No results for input(s): BNP in the last 8760 hours. Basic Metabolic Panel: Recent Labs  Lab 11/24/19 1300 11/25/19 0331 11/26/19 0413 11/27/19 0538  NA 137 141 142 140  K 3.8 3.3* 3.8 3.9  CL 102 103 106 105  CO2 GLUCOSE 212* 71 54* 95  BUN 47* 43* 31* 27*  CREATININE 2.47* 2.11* 1.66* 1.49*  CALCIUM 9.5 9.8 9.3 9.3  MG  --   --  1.9  --    Liver Function Tests: Recent Labs  Lab 11/24/19 1300  AST 28  ALT 27  ALKPHOS 90  BILITOT 2.0*  PROT 5.1*  ALBUMIN 2.6*   No results for input(s): LIPASE, AMYLASE in the last 168 hours. No results for input(s): AMMONIA in the last 168 hours. CBC: Recent Labs  Lab 11/24/19 1300 11/25/19 0331  WBC 6.1 5.7  NEUTROABS 4.6  --   HGB 11.0* 10.9*  HCT 34.4* 32.6*  MCV 98.3 95.9  PLT 214 222   Cardiac Enzymes: No results for input(s): CKTOTAL, CKMB, CKMBINDEX, TROPONINI in the last 168 hours. BNP:  Invalid input(s): POCBNP CBG: Recent Labs  Lab 11/26/19 1127 11/26/19 1644 11/26/19 2117 11/27/19 0627 11/27/19 1121  GLUCAP 217* 151* 247* 78 208*   D-Dimer No results for input(s):  DDIMER in the last 72 hours. Hgb A1c No results for input(s): HGBA1C in the last 72 hours. Lipid Profile No results for input(s): CHOL, HDL, LDLCALC, TRIG, CHOLHDL, LDLDIRECT in the last 72 hours. Thyroid function studies No results for input(s): TSH, T4TOTAL, T3FREE, THYROIDAB in the last 72 hours.  Invalid input(s): FREET3 Anemia work up No results for input(s): VITAMINB12, FOLATE, FERRITIN, TIBC, IRON, RETICCTPCT in the last 72 hours. Urinalysis    Component Value Date/Time   COLORURINE YELLOW 11/24/2019 1355   APPEARANCEUR CLEAR 11/24/2019 1355   LABSPEC 1.010 11/24/2019 1355   PHURINE 5.0 11/24/2019 1355   GLUCOSEU NEGATIVE 11/24/2019 1355   HGBUR MODERATE (A) 11/24/2019 1355   BILIRUBINUR NEGATIVE 11/24/2019 1355   KETONESUR NEGATIVE 11/24/2019 1355   PROTEINUR NEGATIVE 11/24/2019 1355   UROBILINOGEN 0.2 01/28/2012 1143   NITRITE NEGATIVE 11/24/2019 1355   LEUKOCYTESUR MODERATE (A) 11/24/2019 1355   Sepsis Labs Invalid input(s): PROCALCITONIN,  WBC,  LACTICIDVEN Microbiology Recent Results (from the past 240 hour(s))  Urine culture     Status: Abnormal   Collection Time: 11/24/19  1:33 PM   Specimen: Urine, Catheterized  Result Value Ref Range Status   Specimen Description URINE, CATHETERIZED  Final   Special Requests NONE  Final   Culture (A)  Final    <10,000 COLONIES/mL INSIGNIFICANT GROWTH Performed at [facility], [address] [place], [place] [address]    Report Status 11/25/2019 FINAL  Final  Culture, blood (routine x 2)     Status: None (Preliminary result)   Collection Time: 11/24/19  3:15 PM   Specimen: BLOOD  Result Value Ref Range Status   Specimen Description BLOOD RIGHT ANTECUBITAL  Final   Special Requests   Final    BOTTLES DRAWN AEROBIC AND ANAEROBIC Blood Culture results may not be optimal due to an excessive volume of blood received in culture bottles   Culture   Final    NO GROWTH 3 DAYS Performed at [facility], [address] [place], [place] [address]    Report Status PENDING  Incomplete  Culture, blood (routine x 2)     Status: None (Preliminary result)   Collection Time: 11/24/19  3:20 PM   Specimen: BLOOD RIGHT WRIST  Result Value Ref Range Status   Specimen Description BLOOD RIGHT WRIST  Final   Special Requests   Final    BOTTLES DRAWN AEROBIC ONLY Blood Culture results may not be optimal due to an inadequate volume of blood received in culture bottles   Culture   Final    NO GROWTH 3 DAYS Performed at [facility], [address] [place], [place] [address]    Report Status PENDING  Incomplete  SARS CORONAVIRUS 2 (TAT 6-24 HRS) Nasopharyngeal Nasopharyngeal Swab     Status: None   Collection Time: 11/24/19  3:27 PM   Specimen: Nasopharyngeal Swab  Result Value Ref Range Status   SARS Coronavirus 2 NEGATIVE NEGATIVE Final    Comment: (NOTE) SARS-CoV-2 target nucleic acids are NOT DETECTED. The SARS-CoV-2 RNA is generally detectable in upper and lower respiratory specimens during the acute phase of infection. Negative results do not preclude SARS-CoV-2 infection, do not rule out co-infections with other pathogens, and should not be used as the sole basis for  treatment or other patient management decisions. Negative results must be combined with clinical observations, patient history, and epidemiological information. The expected result is Negative. Fact Sheet for Patients: HairSlick.no Fact Sheet for Healthcare Providers: quierodirigir.com This test is not yet approved or cleared by the [place] FDA and  has been authorized for detection and/or diagnosis of SARS-CoV-2 by FDA under an Emergency Use Authorization (EUA). This EUA will remain  in effect (meaning this test can be used) for the duration of the COVID-19 declaration under Section 56 4(b)(1) of the Act, 21 U.S.C. section 360bbb-3(b)(1), unless the authorization is  terminated or revoked sooner. Performed at [facility], [address] [place], [place] [address]      Time coordinating discharge: 35 minutes  SIGNED:   Lanae Boast, MD  [facility] 11/27/2019, 1:51 PM  If 7PM-7AM, please contact night-coverage [email or web address]

## 2019-11-27 NOTE — Progress Notes (Signed)
IR consulted by Dr. Erlinda Hong for management of back pain/?possible kyphoplasty.  Case reviewed by Dr. Estanislado Pandy. Patient is a patient of Dr. Vertell Limber [facility]) and sees him on an outpatient basis. Agree with neurosurgery recommendations- recommend patient to follow-up with Dr. Vertell Limber as an outpatient as previously scheduled for management of back pain. Will delete order. Dr. Lupita Leash aware.  IR available in future if needed.   Bea Graff Tabbitha Janvrin, PA-C 11/27/2019, 9:01 AM

## 2019-11-29 DIAGNOSIS — M545 Low back pain: Secondary | ICD-10-CM | POA: Diagnosis not present

## 2019-11-29 DIAGNOSIS — N179 Acute kidney failure, unspecified: Secondary | ICD-10-CM | POA: Diagnosis not present

## 2019-11-29 DIAGNOSIS — S22080A Wedge compression fracture of T11-T12 vertebra, initial encounter for closed fracture: Secondary | ICD-10-CM | POA: Diagnosis not present

## 2019-11-29 DIAGNOSIS — N183 Chronic kidney disease, stage 3 unspecified: Secondary | ICD-10-CM | POA: Diagnosis not present

## 2019-11-29 DIAGNOSIS — M5489 Other dorsalgia: Secondary | ICD-10-CM | POA: Diagnosis not present

## 2019-11-29 DIAGNOSIS — I4891 Unspecified atrial fibrillation: Secondary | ICD-10-CM | POA: Diagnosis not present

## 2019-11-29 DIAGNOSIS — S32040G Wedge compression fracture of fourth lumbar vertebra, subsequent encounter for fracture with delayed healing: Secondary | ICD-10-CM | POA: Diagnosis not present

## 2019-11-29 DIAGNOSIS — E1165 Type 2 diabetes mellitus with hyperglycemia: Secondary | ICD-10-CM | POA: Diagnosis not present

## 2019-11-29 LAB — CULTURE, BLOOD (ROUTINE X 2)
Culture: NO GROWTH
Culture: NO GROWTH

## 2019-12-01 DIAGNOSIS — H903 Sensorineural hearing loss, bilateral: Secondary | ICD-10-CM | POA: Diagnosis not present

## 2019-12-01 DIAGNOSIS — N401 Enlarged prostate with lower urinary tract symptoms: Secondary | ICD-10-CM | POA: Diagnosis not present

## 2019-12-01 DIAGNOSIS — K5900 Constipation, unspecified: Secondary | ICD-10-CM | POA: Diagnosis not present

## 2019-12-01 DIAGNOSIS — E1122 Type 2 diabetes mellitus with diabetic chronic kidney disease: Secondary | ICD-10-CM | POA: Diagnosis not present

## 2019-12-01 DIAGNOSIS — K5730 Diverticulosis of large intestine without perforation or abscess without bleeding: Secondary | ICD-10-CM | POA: Diagnosis not present

## 2019-12-01 DIAGNOSIS — I959 Hypotension, unspecified: Secondary | ICD-10-CM | POA: Diagnosis not present

## 2019-12-01 DIAGNOSIS — E860 Dehydration: Secondary | ICD-10-CM | POA: Diagnosis not present

## 2019-12-01 DIAGNOSIS — R338 Other retention of urine: Secondary | ICD-10-CM | POA: Diagnosis not present

## 2019-12-01 DIAGNOSIS — N179 Acute kidney failure, unspecified: Secondary | ICD-10-CM | POA: Diagnosis not present

## 2019-12-01 DIAGNOSIS — I480 Paroxysmal atrial fibrillation: Secondary | ICD-10-CM | POA: Diagnosis not present

## 2019-12-01 DIAGNOSIS — N183 Chronic kidney disease, stage 3 unspecified: Secondary | ICD-10-CM | POA: Diagnosis not present

## 2019-12-01 DIAGNOSIS — Z466 Encounter for fitting and adjustment of urinary device: Secondary | ICD-10-CM | POA: Diagnosis not present

## 2019-12-01 DIAGNOSIS — M4856XD Collapsed vertebra, not elsewhere classified, lumbar region, subsequent encounter for fracture with routine healing: Secondary | ICD-10-CM | POA: Diagnosis not present

## 2019-12-01 DIAGNOSIS — E785 Hyperlipidemia, unspecified: Secondary | ICD-10-CM | POA: Diagnosis not present

## 2019-12-01 DIAGNOSIS — E1165 Type 2 diabetes mellitus with hyperglycemia: Secondary | ICD-10-CM | POA: Diagnosis not present

## 2019-12-01 DIAGNOSIS — M4854XD Collapsed vertebra, not elsewhere classified, thoracic region, subsequent encounter for fracture with routine healing: Secondary | ICD-10-CM | POA: Diagnosis not present

## 2019-12-04 DIAGNOSIS — N183 Chronic kidney disease, stage 3 unspecified: Secondary | ICD-10-CM | POA: Diagnosis not present

## 2019-12-18 ENCOUNTER — Telehealth: Payer: Self-pay | Admitting: Cardiology

## 2019-12-18 NOTE — Telephone Encounter (Signed)
Spoke to dtr. Informed that there is no contraindication for him to have vaccine while taking Eliquis.  Dtr reports the center that is giving him the vaccine needs a note stating ok to give while taking Eliquis. Aware I will address letter in the morning and have Dr. Elberta Fortis sign.  She asked me to fax it to: [phone]

## 2019-12-18 NOTE — Telephone Encounter (Signed)
New Message    Daniel Cooke is calling and says the pt is wanting to get a COVID vaccine and says he is on Eliquis and is needing a note from the Dr that its okay to get it    Please call

## 2019-12-19 ENCOUNTER — Encounter: Payer: Self-pay | Admitting: *Deleted

## 2019-12-20 ENCOUNTER — Other Ambulatory Visit: Payer: Self-pay

## 2019-12-20 ENCOUNTER — Ambulatory Visit (HOSPITAL_COMMUNITY)
Admission: RE | Admit: 2019-12-20 | Discharge: 2019-12-20 | Disposition: A | Discharge status: Home / Self Care | Payer: Medicare Other | Source: Ambulatory Visit | Attending: Physician Assistant | Admitting: Physician Assistant

## 2019-12-20 ENCOUNTER — Encounter (HOSPITAL_COMMUNITY): Payer: Self-pay | Admitting: Physician Assistant

## 2019-12-20 VITALS — BP 132/64 | HR 65 | Ht 68.0 in | Wt 164.0 lb

## 2019-12-20 DIAGNOSIS — D6869 Other thrombophilia: Secondary | ICD-10-CM

## 2019-12-20 DIAGNOSIS — Z794 Long term (current) use of insulin: Secondary | ICD-10-CM | POA: Diagnosis not present

## 2019-12-20 DIAGNOSIS — E119 Type 2 diabetes mellitus without complications: Secondary | ICD-10-CM | POA: Diagnosis not present

## 2019-12-20 DIAGNOSIS — I110 Hypertensive heart disease with heart failure: Secondary | ICD-10-CM | POA: Insufficient documentation

## 2019-12-20 DIAGNOSIS — E785 Hyperlipidemia, unspecified: Secondary | ICD-10-CM | POA: Insufficient documentation

## 2019-12-20 DIAGNOSIS — Z79899 Other long term (current) drug therapy: Secondary | ICD-10-CM | POA: Diagnosis not present

## 2019-12-20 DIAGNOSIS — Z791 Long term (current) use of non-steroidal anti-inflammatories (NSAID): Secondary | ICD-10-CM | POA: Insufficient documentation

## 2019-12-20 DIAGNOSIS — Z8249 Family history of ischemic heart disease and other diseases of the circulatory system: Secondary | ICD-10-CM | POA: Insufficient documentation

## 2019-12-20 DIAGNOSIS — Z885 Allergy status to narcotic agent status: Secondary | ICD-10-CM | POA: Insufficient documentation

## 2019-12-20 DIAGNOSIS — I4819 Other persistent atrial fibrillation: Secondary | ICD-10-CM | POA: Diagnosis not present

## 2019-12-20 DIAGNOSIS — I5032 Chronic diastolic (congestive) heart failure: Secondary | ICD-10-CM | POA: Insufficient documentation

## 2019-12-20 NOTE — Telephone Encounter (Signed)
Letter faxed, confirmation received.

## 2019-12-20 NOTE — Progress Notes (Signed)
Primary Care Physician: London Pepper, MD Primary Electrophysiologist: Dr Curt Bears Referring Physician: Dr Aletha Halim is a 86 y.o. male with a history of hypertension, diabetes, and hyperlipidemia. He was found to be in atrial fibrillation after a fall where he suffered a vertebral fracture.  He was found to have a pinched nerve around that vertebra and is planned to have injections performed. Patient seen by Dr Curt Bears on 10/26/19 found to be in afib with symptoms of dizziness. He had been off anticoagulation 2/2 back injections and a urologic procedure. He has a CHADS2VASC score of 3. Patient is fairly unaware of his arrhythmia. He did have a DCCV in 2018 which was unsuccessful and he was in permanent afib. However, he spontaneously converted to SR in 2019 and had been maintaining SR. Patient is due for Rezum procedure on 11/20/19.  On follow up today, patient had rezum procedure on 11/20/19. He was admitted to the hospital 11/24/19 with malaise, weakness and decreased appetite. Patient was found to have AKI and was also treated empirically for UTI given recent procedure and Foley cath. Patient was in rate controlled afib during his admission. His Eliquis was restarted on 11/27/19. Patient reports that since that time he has felt much better. He walked in today without a wheelchair. His daughter confirms that he has more energy and his dizziness seems to have resolved. His main issue today is his back pain but PT is helping tremendously. No surgical intervention planned.  Today, he denies symptoms of palpitations, chest pain, shortness of breath, orthopnea, PND, lower extremity edema, dizziness, presyncope, syncope, snoring, daytime somnolence, bleeding, or neurologic sequela. The patient is tolerating medications without difficulties and is otherwise without complaint today.    Atrial Fibrillation Risk Factors:  he does not have symptoms or diagnosis of sleep apnea. he does not  have a history of rheumatic fever.   he has a BMI of Body mass index is 24.94 kg/m.Marland Kitchen Filed Weights   12/20/19 1445  Weight: 74.4 kg    Family History  Problem Relation Age of Onset  . Heart disease Mother   . Heart disease Brother   . Heart attack Brother      Atrial Fibrillation Management history:  Previous antiarrhythmic drugs: none Previous cardioversions: 2018 Previous ablations: none CHADS2VASC score: 3 Anticoagulation history: warfarin, Eliquis   Past Medical History:  Diagnosis Date  . Arthritis    "was in left knee before replacement; now have it in my right knee" (11/25/2016)  . BPH (benign prostatic hypertrophy)   . Chronic diastolic CHF (congestive heart failure) ([place])    a. 02/2016 Echo: EF 60-65%, no rwma, triv AI, mild MR, mildly to mod dil LA, mod dil RA, PASP 33mmHg.  Marland Kitchen Dysrhythmia   . Essential hypertension   . Hard of hearing    wears bilateral hearing aids  . Hypercholesterolemia   . Osteoarthritis   . PAF (paroxysmal atrial fibrillation) ([place])    a. CHADS2VASC score of 4 --> coumadin.  . Pilonidal cyst    PAST HX - NO PROBLEM NOW  . Pneumonia    "one time; years ago" (11/25/2016)  . Shingles    "long time ago"  . Synovitis of knee 02/27/2014  . Type II diabetes mellitus (HCC)    oral meds - no insulin   Past Surgical History:  Procedure Laterality Date  . APPENDECTOMY    . CARDIOVERSION N/A 02/04/2017   Procedure: CARDIOVERSION;  Surgeon: Lelon Perla, MD;  Location: [facility];  Service: Cardiovascular;  Laterality: N/A;  . CATARACT EXTRACTION W/ INTRAOCULAR LENS IMPLANT Left 10/2016  . CHOLECYSTECTOMY OPEN    . COLONOSCOPY W/ BIOPSIES  08/2005   [name] 04/27/2011  . EYE SURGERY Right    "right" traumatic cataract removed--injury to the eye--states his eyesight is ok in left eye  . JOINT REPLACEMENT    . KNEE ARTHROSCOPY Left 02/28/2014   Procedure: ARTHROSCOPY LEFT KNEE WITH SYNOVECTOMY;  Surgeon: [name], MD;  Location:  [facility] ORS;  Service: Orthopedics;  Laterality: Left;  . KYPHOPLASTY  2017  . KYPHOPLASTY N/A 05/28/2017   Procedure: Lumbar Four Kyphoplasty;  Surgeon: Maeola Harman, MD;  Location: [facility];  Service: Neurosurgery;  Laterality: N/A;  L4 Kyphoplasty  . SHOULDER OPEN ROTATOR CUFF REPAIR Left   . TONSILLECTOMY    . TOTAL KNEE ARTHROPLASTY  02/08/2012   Procedure: TOTAL KNEE ARTHROPLASTY;  Surgeon: [name], MD;  Location: [facility] ORS;  Service: Orthopedics;  Laterality: Left;  Marland Kitchen VASECTOMY      Current Outpatient Medications  Medication Sig Dispense Refill  . acetaminophen (TYLENOL) 325 MG tablet Take 325 mg by mouth every 6 (six) hours as needed for mild pain.    Marland Kitchen apixaban (ELIQUIS) 2.5 MG TABS tablet Take 1 tablet (2.5 mg total) by mouth 2 (two) times daily. 180 tablet 1  . CALCIUM PO Take 1 tablet by mouth daily.    . Cholecalciferol (VITAMIN D3 PO) Take 1 tablet by mouth daily.    . Continuous Blood Gluc Receiver (FREESTYLE LIBRE 14 DAY READER) DEVI See admin instructions.    . finasteride (PROSCAR) 5 MG tablet Take 5 mg by mouth daily.    . furosemide (LASIX) 40 MG tablet Take 1/2 (one-half) tablet by mouth once daily (Patient taking differently: Take 20 mg by mouth daily. ) 45 tablet 3  . HUMALOG KWIKPEN 100 UNIT/ML KwikPen Inject 2 Units into the skin daily with supper.     . Insulin Glargine (LANTUS SOLOSTAR) 100 UNIT/ML Solostar Pen Inject 4 Units into the skin at bedtime. 15 mL 0  . Insulin Pen Needle (BD PEN NEEDLE NANO U/F) 32G X 4 MM MISC USE AS DIRECTED ONCE DAILY FOR 30 DAYS    . simvastatin (ZOCOR) 20 MG tablet Take 20 mg by mouth at bedtime.    . traZODone (DESYREL) 50 MG tablet TAKE 1 2 (ONE HALF) TABLET BY MOUTH ONCE DAILY AT BEDTIME AS NEEDED     No current facility-administered medications for this encounter.    Allergies  Allergen Reactions  . Codeine Nausea And Vomiting and Other (See Comments)    MAKES ME CRAZY  . Oxycodone Nausea And Vomiting and Other (See Comments)     MAKES ME CRAZY  . Other Other (See Comments)    All narcotic pain medicine makes him crazy.     Social History   Socioeconomic History  . Marital status: Married    Spouse name: Not on file  . Number of children: Not on file  . Years of education: Not on file  . Highest education level: Not on file  Occupational History  . Not on file  Tobacco Use  . Smoking status: Never Smoker  . Smokeless tobacco: Never Used  Substance and Sexual Activity  . Alcohol use: No  . Drug use: No  . Sexual activity: Not on file  Other Topics Concern  . Not on file  Social History Narrative  . Not on file  Social Determinants of Health   Financial Resource Strain:   . Difficulty of Paying Living Expenses: Not on file  Food Insecurity:   . Worried About Programme researcher, broadcasting/film/video in the Last Year: Not on file  . Ran Out of Food in the Last Year: Not on file  Transportation Needs:   . Lack of Transportation (Medical): Not on file  . Lack of Transportation (Non-Medical): Not on file  Physical Activity:   . Days of Exercise per Week: Not on file  . Minutes of Exercise per Session: Not on file  Stress:   . Feeling of Stress : Not on file  Social Connections:   . Frequency of Communication with Friends and Family: Not on file  . Frequency of Social Gatherings with Friends and Family: Not on file  . Attends Religious Services: Not on file  . Active Member of Clubs or Organizations: Not on file  . Attends Banker Meetings: Not on file  . Marital Status: Not on file  Intimate Partner Violence:   . Fear of Current or Ex-Partner: Not on file  . Emotionally Abused: Not on file  . Physically Abused: Not on file  . Sexually Abused: Not on file     ROS- All systems are reviewed and negative except as per the HPI above.  Physical Exam: Vitals:   12/20/19 1445  BP: 132/64  Pulse: 65  Weight: 74.4 kg  Height: 5\' 8"  (1.727 m)    GEN- The patient is well appearing elderly male,  alert and oriented x 3 today.   HEENT-head normocephalic, atraumatic, sclera clear, conjunctiva pink, hearing intact, trachea midline. Lungs- Clear to ausculation bilaterally, normal work of breathing Heart- irregular rate and rhythm, no murmurs, rubs or gallops  GI- soft, NT, ND, + BS Extremities- no clubbing, cyanosis, or edema MS- no significant deformity or atrophy Skin- no rash or lesion Psych- euthymic mood, full affect Neuro- strength and sensation are intact   Wt Readings from Last 3 Encounters:  12/20/19 74.4 kg  11/27/19 83.9 kg  11/15/19 81.6 kg    EKG today demonstrates afib HR 65, QRS 82, QTc 422  Echo 11/26/16 demonstrated  - Left ventricle: The cavity size was normal. Wall thickness was   increased in a pattern of moderate LVH. Systolic function was   normal. The estimated ejection fraction was in the range of 60%   to 65%. Wall motion was normal; there were no regional wall   motion abnormalities. - Mitral valve: There was moderate regurgitation. - Left atrium: The atrium was moderately dilated. - Right ventricle: The cavity size was mildly dilated. Wall   thickness was normal. - Right atrium: The atrium was severely dilated. - Tricuspid valve: There was moderate regurgitation. - Pulmonary arteries: Systolic pressure was moderately increased.   PA peak pressure: 65 mm Hg (S).  Epic records are reviewed at length today  Assessment and Plan:  1. Persistent atrial fibrillation Patient remains in rate controlled afib.  We had a long discussion today about rhythm vs rate control. At this point, given his symptom resolution, advanced age, and rate control, patient and his family would prefer a rate control strategy.  Continue Eliquis 2.5 mg BID (resumed 11/27/19)  This patients CHA2DS2-VASc Score and unadjusted Ischemic Stroke Rate (% per year) is equal to 3.2 % stroke rate/year from a score of 3  Above score calculated as 1 point each if present [CHF, HTN, DM,  Vascular=MI/PAD/Aortic Plaque, Age if 22-74, or  Male] Above score calculated as 2 points each if present [Age > 75, or Stroke/TIA/TE]   2. Chronic diastolic CHF No signs or symptoms of fluid overload. Weight stable.  3. HTN Stable, no changes today.   Follow up with Dr Elberta Fortis per recall.   Jorja Loa PA-C [facility] [address] [place], [place] [address] [phone] 12/20/2019 4:40 PM

## 2019-12-29 DIAGNOSIS — N401 Enlarged prostate with lower urinary tract symptoms: Secondary | ICD-10-CM | POA: Diagnosis not present

## 2019-12-29 DIAGNOSIS — R351 Nocturia: Secondary | ICD-10-CM | POA: Diagnosis not present

## 2020-01-01 DIAGNOSIS — E1165 Type 2 diabetes mellitus with hyperglycemia: Secondary | ICD-10-CM | POA: Diagnosis not present

## 2020-01-01 DIAGNOSIS — N183 Chronic kidney disease, stage 3 unspecified: Secondary | ICD-10-CM | POA: Diagnosis not present

## 2020-01-01 DIAGNOSIS — I480 Paroxysmal atrial fibrillation: Secondary | ICD-10-CM | POA: Diagnosis not present

## 2020-01-01 DIAGNOSIS — Z466 Encounter for fitting and adjustment of urinary device: Secondary | ICD-10-CM | POA: Diagnosis not present

## 2020-01-01 DIAGNOSIS — M4854XD Collapsed vertebra, not elsewhere classified, thoracic region, subsequent encounter for fracture with routine healing: Secondary | ICD-10-CM | POA: Diagnosis not present

## 2020-01-01 DIAGNOSIS — M4856XD Collapsed vertebra, not elsewhere classified, lumbar region, subsequent encounter for fracture with routine healing: Secondary | ICD-10-CM | POA: Diagnosis not present

## 2020-01-01 DIAGNOSIS — N401 Enlarged prostate with lower urinary tract symptoms: Secondary | ICD-10-CM | POA: Diagnosis not present

## 2020-01-01 DIAGNOSIS — H903 Sensorineural hearing loss, bilateral: Secondary | ICD-10-CM | POA: Diagnosis not present

## 2020-01-01 DIAGNOSIS — E785 Hyperlipidemia, unspecified: Secondary | ICD-10-CM | POA: Diagnosis not present

## 2020-01-01 DIAGNOSIS — K5730 Diverticulosis of large intestine without perforation or abscess without bleeding: Secondary | ICD-10-CM | POA: Diagnosis not present

## 2020-01-01 DIAGNOSIS — K5900 Constipation, unspecified: Secondary | ICD-10-CM | POA: Diagnosis not present

## 2020-01-01 DIAGNOSIS — E1122 Type 2 diabetes mellitus with diabetic chronic kidney disease: Secondary | ICD-10-CM | POA: Diagnosis not present

## 2020-01-01 DIAGNOSIS — E860 Dehydration: Secondary | ICD-10-CM | POA: Diagnosis not present

## 2020-01-01 DIAGNOSIS — N179 Acute kidney failure, unspecified: Secondary | ICD-10-CM | POA: Diagnosis not present

## 2020-01-01 DIAGNOSIS — I959 Hypotension, unspecified: Secondary | ICD-10-CM | POA: Diagnosis not present

## 2020-01-01 DIAGNOSIS — R338 Other retention of urine: Secondary | ICD-10-CM | POA: Diagnosis not present

## 2020-01-29 DIAGNOSIS — R3915 Urgency of urination: Secondary | ICD-10-CM | POA: Diagnosis not present

## 2020-02-27 ENCOUNTER — Other Ambulatory Visit: Payer: Self-pay

## 2020-02-27 ENCOUNTER — Ambulatory Visit: Payer: Medicare Other | Admitting: Cardiology

## 2020-02-27 ENCOUNTER — Encounter: Payer: Self-pay | Admitting: Cardiology

## 2020-02-27 VITALS — BP 138/64 | HR 72 | Ht 68.0 in | Wt 177.4 lb

## 2020-02-27 DIAGNOSIS — I4819 Other persistent atrial fibrillation: Secondary | ICD-10-CM | POA: Diagnosis not present

## 2020-02-27 NOTE — Progress Notes (Signed)
Electrophysiology Office Note   Date:  02/27/2020   ID:  Daniel Cooke, DOB 1923/12/27, MRN [id number]  PCP:  Farris Has, MD  Electrophysiologist:  Regan Lemming, MD    No chief complaint on file.    History of Present Illness: Daniel Cooke is a 86 y.o. male who presents today for electrophysiology evaluation.   He has a history of hypertension, diabetes, and hyperlipidemia. He was found to be in atrial fibrillation after a fall where he suffered a vertebral fracture.  He was found to have a pinched nerve around that vertebra and is planned to have injections performed. He was admitted for a HF exacerbation with worsening DOE and edema. His weight had increased by 20 lbs. BNP was elevated at 1068. It was thought that his AF was contributing to his HF episode. He was not cardioverted as he was planned to have a second steroid injection in his back in January.  Today, denies symptoms of palpitations, chest pain, shortness of breath, orthopnea, PND, lower extremity edema, claudication, dizziness, presyncope, syncope, bleeding, or neurologic sequela. The patient is tolerating medications without difficulties.  Overall he is doing well.  He has no chest pain or shortness of breath.  He has improved substantially since last being seen.  He has more energy with quite a bit less weakness and fatigue.  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  Past Medical History:  Diagnosis Date  . Arthritis    "was in left knee before replacement; now have it in my right knee" (11/25/2016)  . BPH (benign prostatic hypertrophy)   . Chronic diastolic CHF (congestive heart failure) (HCC)    a. 02/2016 Echo: EF 60-65%, no rwma, triv AI, mild MR, mildly to mod dil LA, mod dil RA, PASP .  Marland Kitchen Dysrhythmia   . Essential hypertension   . Hard of hearing    wears bilateral hearing aids  . Hypercholesterolemia   . Osteoarthritis   . PAF  (paroxysmal atrial fibrillation) (HCC)    a. CHADS2VASC score of 4 --> coumadin.  . Pilonidal cyst    PAST HX - NO PROBLEM NOW  . Pneumonia    "one time; years ago" (11/25/2016)  . Shingles    "long time ago"  . Synovitis of knee 02/27/2014  . Type II diabetes mellitus (HCC)    oral meds - no insulin   Past Surgical History:  Procedure Laterality Date  . APPENDECTOMY    . CARDIOVERSION N/A 02/04/2017   Procedure: CARDIOVERSION;  Surgeon: [name], MD;  Location: [facility] ENDOSCOPY;  Service: Cardiovascular;  Laterality: N/A;  . CATARACT EXTRACTION W/ INTRAOCULAR LENS IMPLANT Left 10/2016  . CHOLECYSTECTOMY OPEN    . COLONOSCOPY W/ BIOPSIES  08/2005   [name] 04/27/2011  . EYE SURGERY Right    "right" traumatic cataract removed--injury to the eye--states his eyesight is ok in left eye  . JOINT REPLACEMENT    . KNEE ARTHROSCOPY Left 02/28/2014   Procedure: ARTHROSCOPY LEFT KNEE WITH SYNOVECTOMY;  Surgeon: [name], MD;  Location: [facility];  Service: Orthopedics;  Laterality: Left;  . KYPHOPLASTY  2017  . KYPHOPLASTY N/A 05/28/2017   Procedure: Lumbar Four Kyphoplasty;  Surgeon: Maeola Harman, MD;  Location: [facility] OR;  Service: Neurosurgery;  Laterality: N/A;  L4 Kyphoplasty  . SHOULDER OPEN ROTATOR CUFF REPAIR Left   . TONSILLECTOMY    . TOTAL KNEE ARTHROPLASTY  02/08/2012   Procedure: TOTAL KNEE ARTHROPLASTY;  Surgeon: Gearlean Alf, MD;  Location: [facility]S;  Service: Orthopedics;  Laterality: Left;  Marland Kitchen VASECTOMY       Current Outpatient Medications  Medication Sig Dispense Refill  . acetaminophen (TYLENOL) 325 MG tablet Take 325 mg by mouth every 6 (six) hours as needed for mild pain.    Marland Kitchen apixaban (ELIQUIS) 2.5 MG TABS tablet Take 1 tablet (2.5 mg total) by mouth 2 (two) times daily. 180 tablet 1  . CALCIUM PO Take 1 tablet by mouth daily.    . Cholecalciferol (VITAMIN D3 PO) Take 1 tablet by mouth daily.    . Continuous Blood Gluc Receiver (FREESTYLE LIBRE 14 DAY READER) DEVI  See admin instructions.    . finasteride (PROSCAR) 5 MG tablet Take 5 mg by mouth daily.    . furosemide (LASIX) 40 MG tablet Take 1/2 (one-half) tablet by mouth once daily 45 tablet 3  . HUMALOG KWIKPEN 100 UNIT/ML KwikPen Inject 2 Units into the skin daily with supper.     . Insulin Glargine (LANTUS SOLOSTAR) 100 UNIT/ML Solostar Pen Inject 4 Units into the skin at bedtime. 15 mL 0  . Insulin Pen Needle (BD PEN NEEDLE NANO U/F) 32G X 4 MM MISC USE AS DIRECTED ONCE DAILY FOR 30 DAYS    . simvastatin (ZOCOR) 20 MG tablet Take 20 mg by mouth at bedtime.    . traZODone (DESYREL) 50 MG tablet TAKE 1 2 (ONE HALF) TABLET BY MOUTH ONCE DAILY AT BEDTIME AS NEEDED     No current facility-administered medications for this visit.    Allergies:   Codeine, Oxycodone, and Other   Social History:  The patient  reports that he has never smoked. He has never used smokeless tobacco. He reports that he does not drink alcohol or use drugs.   Family History:  The patient's family history includes Heart attack in his brother; Heart disease in his brother and mother.    ROS:  Please see the history of present illness.   Otherwise, review of systems is positive for none.   All other systems are reviewed and negative.   PHYSICAL EXAM: VS:  BP 138/64   Pulse 72   Ht 5\' 8"  (1.727 m)   Wt 177 lb 6.4 oz (80.5 kg)   SpO2 98%   BMI 26.97 kg/m  , BMI Body mass index is 26.97 kg/m. GEN: Well nourished, well developed, in no acute distress  HEENT: normal  Neck: no JVD, carotid bruits, or masses Cardiac: Irregular; no murmurs, rubs, or gallops,no edema  Respiratory:  clear to auscultation bilaterally, normal work of breathing GI: soft, nontender, nondistended, + BS MS: no deformity or atrophy  Skin: warm and dry Neuro:  Strength and sensation are intact Psych: euthymic mood, full affect  EKG:  EKG is ordered today. Personal review of the ekg ordered shows atrial fibrillation, rate 72   Recent  Labs: 11/24/2019: ALT 27 11/25/2019: Hemoglobin 10.9; Platelets 222 11/26/2019: Magnesium 1.9 11/27/2019: BUN 27; Creatinine, Ser 1.49; Potassium 3.9; Sodium 140    Lipid Panel  No results found for: CHOL, TRIG, HDL, CHOLHDL, VLDL, LDLCALC, LDLDIRECT   Wt Readings from Last 3 Encounters:  02/27/20 177 lb 6.4 oz (80.5 kg)  12/20/19 164 lb (74.4 kg)  11/27/19 185 lb (83.9 kg)      Other studies Reviewed: Additional studies/ records that were reviewed today include: 12/2010 MPI  Review of the above records today demonstrates:  ECG  positive for ischemia, myoview with normal perfusion  11/26/16 TTE - Left ventricle: The cavity size was normal. Wall thickness was   increased in a pattern of moderate LVH. Systolic function was   normal. The estimated ejection fraction was in the range of 60%   to 65%. Wall motion was normal; there were no regional wall   motion abnormalities. - Mitral valve: There was moderate regurgitation. - Left atrium: The atrium was moderately dilated. - Right ventricle: The cavity size was mildly dilated. Wall   thickness was normal. - Right atrium: The atrium was severely dilated. - Tricuspid valve: There was moderate regurgitation. - Pulmonary arteries: Systolic pressure was moderately increased.   PA peak pressure: 65 mm Hg (S).  ASSESSMENT AND PLAN:  1.  Persistent Atrial fibrillation: On Coumadin.  CHA2DS2-VASc of 3.  His weakness and fatigue has improved.  He remains in atrial fibrillation.  As he is improved without much in the way of symptoms, we Raaga Maeder leave him in atrial fibrillation without a plan for cardioversion.   2. Diastolic heart failure: No obvious signs of volume overload  3. Hyperlipidemia: Continue statin   Current medicines are reviewed at length with the patient today.   The patient does not have concerns regarding his medicines.  The following changes were made today: None  Labs/ tests ordered today include:  Orders Placed This  Encounter  Procedures  . EKG 12-Lead     Disposition:   FU with Shavon Ashmore 6 months  Signed, TRUE [place] Jorja Loa, MD  02/27/2020 4:09 PM     [facility] [address] [place] [address] [phone] (office) [phone] (fax)

## 2020-03-05 DIAGNOSIS — N1832 Chronic kidney disease, stage 3b: Secondary | ICD-10-CM | POA: Diagnosis not present

## 2020-03-05 DIAGNOSIS — I10 Essential (primary) hypertension: Secondary | ICD-10-CM | POA: Diagnosis not present

## 2020-03-05 DIAGNOSIS — I4891 Unspecified atrial fibrillation: Secondary | ICD-10-CM | POA: Diagnosis not present

## 2020-03-05 DIAGNOSIS — F5102 Adjustment insomnia: Secondary | ICD-10-CM | POA: Diagnosis not present

## 2020-03-21 DIAGNOSIS — M1711 Unilateral primary osteoarthritis, right knee: Secondary | ICD-10-CM | POA: Diagnosis not present

## 2020-03-28 DIAGNOSIS — M1711 Unilateral primary osteoarthritis, right knee: Secondary | ICD-10-CM | POA: Diagnosis not present

## 2020-04-04 DIAGNOSIS — R3915 Urgency of urination: Secondary | ICD-10-CM | POA: Diagnosis not present

## 2020-04-04 DIAGNOSIS — N401 Enlarged prostate with lower urinary tract symptoms: Secondary | ICD-10-CM | POA: Diagnosis not present

## 2020-04-04 DIAGNOSIS — N5201 Erectile dysfunction due to arterial insufficiency: Secondary | ICD-10-CM | POA: Diagnosis not present

## 2020-04-04 DIAGNOSIS — R351 Nocturia: Secondary | ICD-10-CM | POA: Diagnosis not present

## 2020-04-05 DIAGNOSIS — M1711 Unilateral primary osteoarthritis, right knee: Secondary | ICD-10-CM | POA: Diagnosis not present

## 2020-04-12 ENCOUNTER — Telehealth: Payer: Self-pay

## 2020-04-12 NOTE — Telephone Encounter (Signed)
   [facility] Pre-operative Risk Assessment    Request for surgical clearance:  1. What type of surgery is being performed? Radiofrequency Ablation Spine Injection   2. When is this surgery scheduled? 05/21/20   3. What type of clearance is required (medical clearance vs. Pharmacy clearance to hold med vs. Both)? Pharmacy  4. Are there any medications that need to be held prior to surgery and how long?Eliquis for 3 day prior   5. Practice name and name of physician performing surgery? [facility]/ Clydell Hakim, MD  6. What is your office phone number? [phone]    7.   What is your office fax number? [phone]  8.   Anesthesia type (None, local, MAC, general)? Info not available, No answer at that office   Cleon Gustin 04/12/2020, 12:01 PM  _________________________________________________________________   (provider comments below)

## 2020-04-12 NOTE — Telephone Encounter (Signed)
Patient with diagnosis of atrial fibrillation on Eliquis for anticoagulation.    Procedure: Radiofrequency Ablation Spine Injection   Date of procedure: 05/21/20  CHADS2-VASc score of  5 (CHF, HTN, AGE x 2, DM2)  CrCl 23 mL/min   Per office protocol, patient can hold Eliquis for 3 days prior to procedure.

## 2020-04-12 NOTE — Telephone Encounter (Signed)
   Primary Cardiologist: Will Jorja Loa, MD  Chart reviewed as part of pre-operative protocol coverage.   Per pharmacy recommendations, patient can hold eliquis 3 days prior to his upcoming spinal injection and should restart when cleared to do so by Dr. Ollen Bowl.  I will route this recommendation to the requesting party via Epic fax function and remove from pre-op pool.  Please call with questions.  Beatriz Stallion, PA-C 04/12/2020, 4:43 PM

## 2020-04-15 ENCOUNTER — Telehealth: Payer: Self-pay

## 2020-04-15 NOTE — Telephone Encounter (Signed)
   [facility] Pre-operative Risk Assessment    Request for surgical clearance:  1. What type of surgery is being performed? Radiofrequency Ablation Spine Injection   2. When is this surgery scheduled? 05/21/20  3. What type of clearance is required (medical clearance vs. Pharmacy clearance to hold med vs. Both)? Medical clearance  4. Are there any medications that need to be held prior to surgery and how long? Need to discontinue eliquis for 3 days prior to injection   5. Practice name and name of physician performing surgery? [facility] - Dr. Clydell Hakim  6. What is your office phone number [phone]    7.   What is your office fax number [phone]  8.   Anesthesia type (None, local, MAC, general) ? N/A   [place] 04/15/2020, 12:15 PM  _________________________________________________________________

## 2020-04-16 NOTE — Telephone Encounter (Signed)
   Primary Cardiologist: Will Jorja Loa, MD  Chart reviewed as part of pre-operative protocol coverage. Patient was contacted 04/16/2020 in reference to pre-operative risk assessment for pending surgery as outlined below.  Daniel Cooke was last seen on 02/27/20 by Dr. Raul Del.  Since that day, Daniel Cooke has been stable from a cardiac standpoint.  Therefore, based on ACC/AHA guidelines, the patient would be at acceptable risk for the planned procedure without further cardiovascular testing.   Review of chart shows a request for holding Eliquis prior to injection with Pharmacy recommendations for 3 days prior.   I will route this recommendation for medical/pharmacy clearance to the requesting party via Epic fax function and remove from pre-op pool.  Please call with questions.  Daniel Page, NP 04/16/2020, 8:37 AM

## 2020-05-06 DIAGNOSIS — R42 Dizziness and giddiness: Secondary | ICD-10-CM | POA: Diagnosis not present

## 2020-05-09 DIAGNOSIS — Z794 Long term (current) use of insulin: Secondary | ICD-10-CM | POA: Diagnosis not present

## 2020-05-09 DIAGNOSIS — Z7901 Long term (current) use of anticoagulants: Secondary | ICD-10-CM | POA: Diagnosis not present

## 2020-05-09 DIAGNOSIS — I130 Hypertensive heart and chronic kidney disease with heart failure and stage 1 through stage 4 chronic kidney disease, or unspecified chronic kidney disease: Secondary | ICD-10-CM | POA: Diagnosis not present

## 2020-05-09 DIAGNOSIS — H9190 Unspecified hearing loss, unspecified ear: Secondary | ICD-10-CM | POA: Diagnosis not present

## 2020-05-09 DIAGNOSIS — N3281 Overactive bladder: Secondary | ICD-10-CM | POA: Diagnosis not present

## 2020-05-09 DIAGNOSIS — Z602 Problems related to living alone: Secondary | ICD-10-CM | POA: Diagnosis not present

## 2020-05-09 DIAGNOSIS — H35033 Hypertensive retinopathy, bilateral: Secondary | ICD-10-CM | POA: Diagnosis not present

## 2020-05-09 DIAGNOSIS — Z96652 Presence of left artificial knee joint: Secondary | ICD-10-CM | POA: Diagnosis not present

## 2020-05-09 DIAGNOSIS — N184 Chronic kidney disease, stage 4 (severe): Secondary | ICD-10-CM | POA: Diagnosis not present

## 2020-05-09 DIAGNOSIS — N400 Enlarged prostate without lower urinary tract symptoms: Secondary | ICD-10-CM | POA: Diagnosis not present

## 2020-05-09 DIAGNOSIS — R296 Repeated falls: Secondary | ICD-10-CM | POA: Diagnosis not present

## 2020-05-09 DIAGNOSIS — E1122 Type 2 diabetes mellitus with diabetic chronic kidney disease: Secondary | ICD-10-CM | POA: Diagnosis not present

## 2020-05-09 DIAGNOSIS — I4891 Unspecified atrial fibrillation: Secondary | ICD-10-CM | POA: Diagnosis not present

## 2020-05-09 DIAGNOSIS — E7800 Pure hypercholesterolemia, unspecified: Secondary | ICD-10-CM | POA: Diagnosis not present

## 2020-05-09 DIAGNOSIS — R42 Dizziness and giddiness: Secondary | ICD-10-CM | POA: Diagnosis not present

## 2020-05-09 DIAGNOSIS — I509 Heart failure, unspecified: Secondary | ICD-10-CM | POA: Diagnosis not present

## 2020-05-14 ENCOUNTER — Other Ambulatory Visit: Payer: Self-pay | Admitting: Cardiology

## 2020-05-15 NOTE — Telephone Encounter (Signed)
Pt last saw Dr Elberta Fortis 02/27/20, last labs 11/25/19 Creat 2.11, age 86, weight 80.5kg, based on specified criteria pt is on appropriate dosage of Eliquis 2.5mg  BID.  Will refill rx.

## 2020-05-21 DIAGNOSIS — M47816 Spondylosis without myelopathy or radiculopathy, lumbar region: Secondary | ICD-10-CM | POA: Diagnosis not present

## 2020-05-30 DIAGNOSIS — M47816 Spondylosis without myelopathy or radiculopathy, lumbar region: Secondary | ICD-10-CM | POA: Diagnosis not present

## 2020-06-07 DIAGNOSIS — E1165 Type 2 diabetes mellitus with hyperglycemia: Secondary | ICD-10-CM | POA: Diagnosis not present

## 2020-06-07 DIAGNOSIS — R2230 Localized swelling, mass and lump, unspecified upper limb: Secondary | ICD-10-CM | POA: Diagnosis not present

## 2020-06-10 ENCOUNTER — Other Ambulatory Visit: Payer: Self-pay | Admitting: Family Medicine

## 2020-06-10 DIAGNOSIS — Z602 Problems related to living alone: Secondary | ICD-10-CM | POA: Diagnosis not present

## 2020-06-10 DIAGNOSIS — N184 Chronic kidney disease, stage 4 (severe): Secondary | ICD-10-CM | POA: Diagnosis not present

## 2020-06-10 DIAGNOSIS — H9190 Unspecified hearing loss, unspecified ear: Secondary | ICD-10-CM | POA: Diagnosis not present

## 2020-06-10 DIAGNOSIS — Z96652 Presence of left artificial knee joint: Secondary | ICD-10-CM | POA: Diagnosis not present

## 2020-06-10 DIAGNOSIS — Z794 Long term (current) use of insulin: Secondary | ICD-10-CM | POA: Diagnosis not present

## 2020-06-10 DIAGNOSIS — N400 Enlarged prostate without lower urinary tract symptoms: Secondary | ICD-10-CM | POA: Diagnosis not present

## 2020-06-10 DIAGNOSIS — I509 Heart failure, unspecified: Secondary | ICD-10-CM | POA: Diagnosis not present

## 2020-06-10 DIAGNOSIS — I4891 Unspecified atrial fibrillation: Secondary | ICD-10-CM | POA: Diagnosis not present

## 2020-06-10 DIAGNOSIS — N3281 Overactive bladder: Secondary | ICD-10-CM | POA: Diagnosis not present

## 2020-06-10 DIAGNOSIS — E1122 Type 2 diabetes mellitus with diabetic chronic kidney disease: Secondary | ICD-10-CM | POA: Diagnosis not present

## 2020-06-10 DIAGNOSIS — H35033 Hypertensive retinopathy, bilateral: Secondary | ICD-10-CM | POA: Diagnosis not present

## 2020-06-10 DIAGNOSIS — Z7901 Long term (current) use of anticoagulants: Secondary | ICD-10-CM | POA: Diagnosis not present

## 2020-06-10 DIAGNOSIS — R42 Dizziness and giddiness: Secondary | ICD-10-CM | POA: Diagnosis not present

## 2020-06-10 DIAGNOSIS — I130 Hypertensive heart and chronic kidney disease with heart failure and stage 1 through stage 4 chronic kidney disease, or unspecified chronic kidney disease: Secondary | ICD-10-CM | POA: Diagnosis not present

## 2020-06-10 DIAGNOSIS — R2230 Localized swelling, mass and lump, unspecified upper limb: Secondary | ICD-10-CM

## 2020-06-10 DIAGNOSIS — E7800 Pure hypercholesterolemia, unspecified: Secondary | ICD-10-CM | POA: Diagnosis not present

## 2020-06-10 DIAGNOSIS — R296 Repeated falls: Secondary | ICD-10-CM | POA: Diagnosis not present

## 2020-06-13 ENCOUNTER — Ambulatory Visit
Admission: RE | Admit: 2020-06-13 | Discharge: 2020-06-13 | Disposition: A | Discharge status: Home / Self Care | Payer: Medicare Other | Source: Ambulatory Visit | Attending: Family Medicine | Admitting: Family Medicine

## 2020-06-13 DIAGNOSIS — R2232 Localized swelling, mass and lump, left upper limb: Secondary | ICD-10-CM | POA: Diagnosis not present

## 2020-06-13 DIAGNOSIS — R2230 Localized swelling, mass and lump, unspecified upper limb: Secondary | ICD-10-CM

## 2020-06-14 ENCOUNTER — Other Ambulatory Visit: Payer: Self-pay | Admitting: Family Medicine

## 2020-06-14 DIAGNOSIS — R2230 Localized swelling, mass and lump, unspecified upper limb: Secondary | ICD-10-CM

## 2020-06-20 IMAGING — MR MR THORACIC SPINE W/O CM
4 of 8 series · 19 of 48 positions shown · non-contrast
Comparison: None.

CLINICAL DATA: Back pain.  Rule out fracture.

EXAM:
MRI THORACIC SPINE WITHOUT CONTRAST
TECHNIQUE: Multiplanar, multisequence MR imaging of the thoracic spine was
performed. No intravenous contrast was administered.

[Series 2: T1 · sagittal · 3.0mm · 0.90mm/px · 3 of 14 slices shown]
[im 1/14]
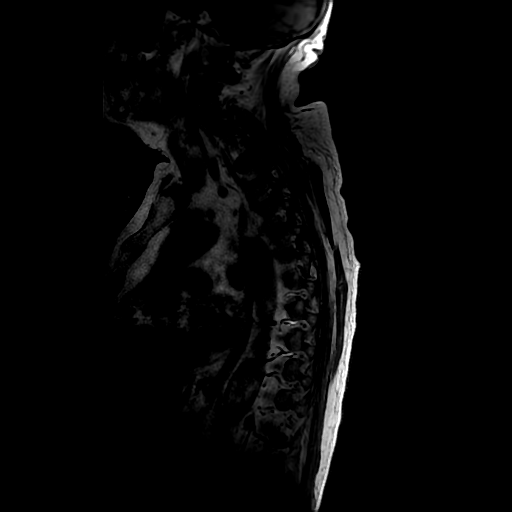
[im 7/14]
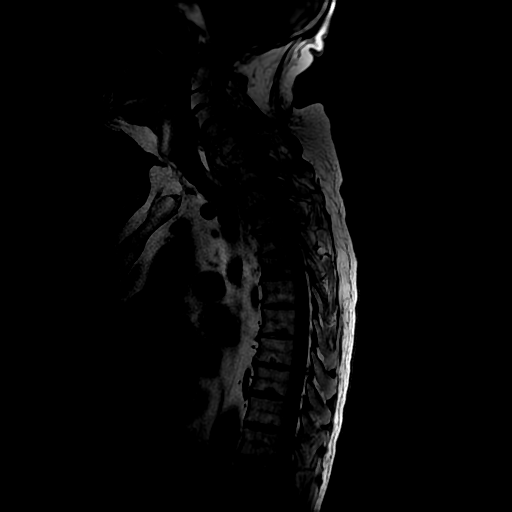
[im 14/14]
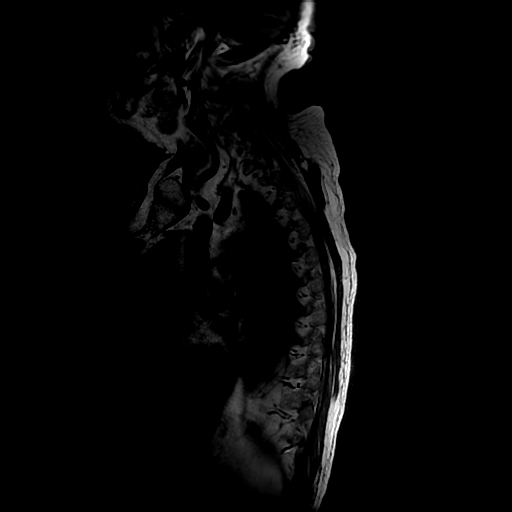

[Series 3: T2 · sagittal · 3.0mm · 0.66mm/px · 3 of 13 slices shown (1 of 3)]
[im 1/13]
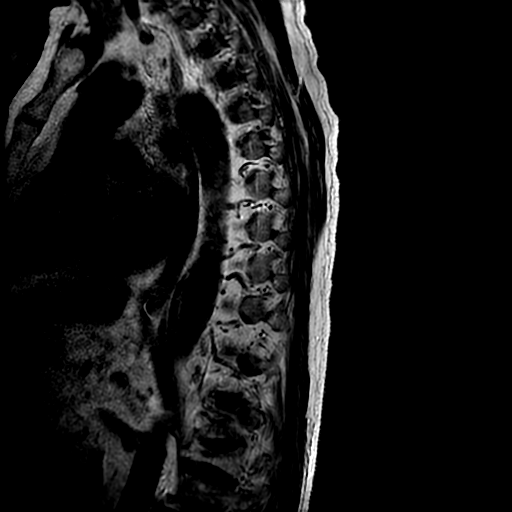
[im 7/13]
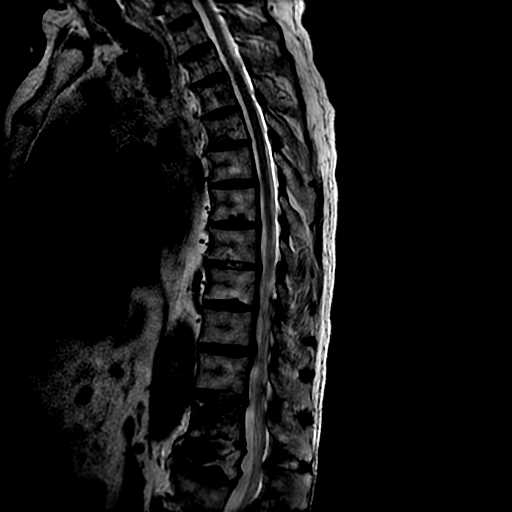
[im 13/13]
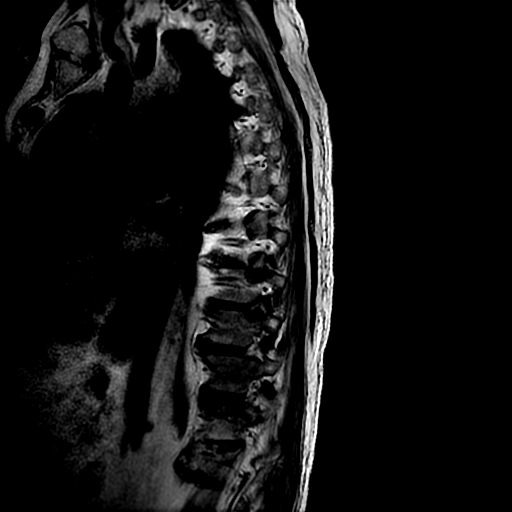

[Series 6: T2 · axial · 4.0mm · 0.39mm/px · z∈[-277,-121]mm · 8 of 30 slices shown (2 of 3)]
[im 1/30]
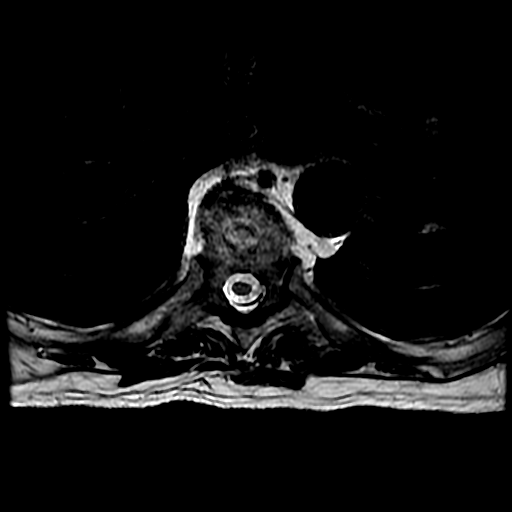
[im 5/30]
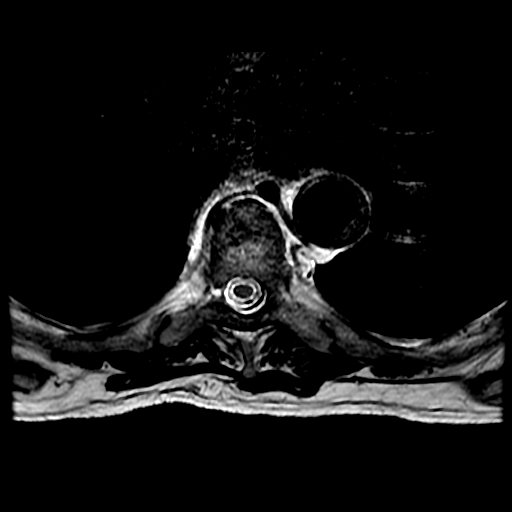
[im 9/30]
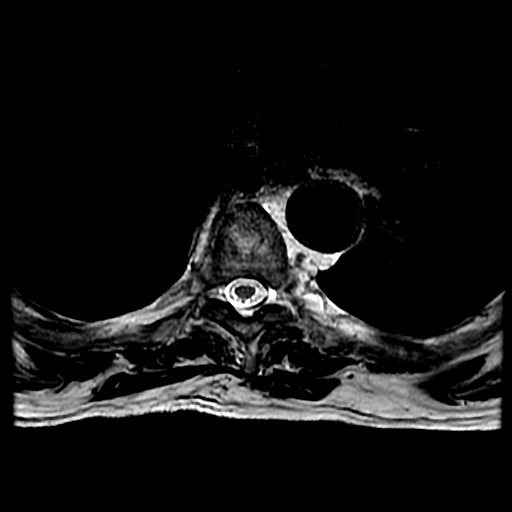
[im 13/30]
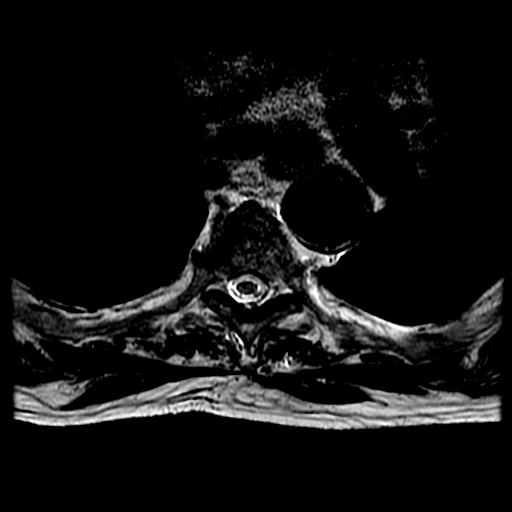
[im 17/30]
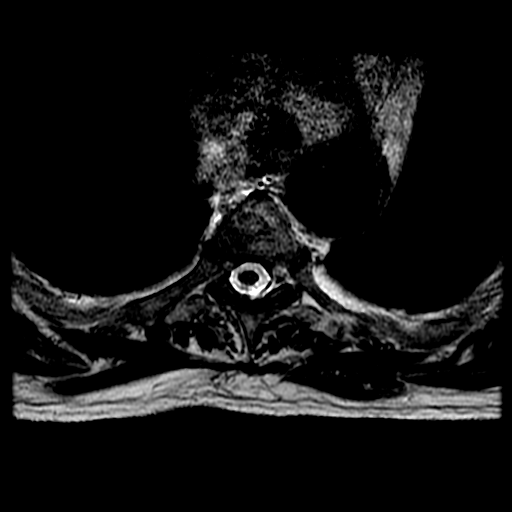
[im 21/30]
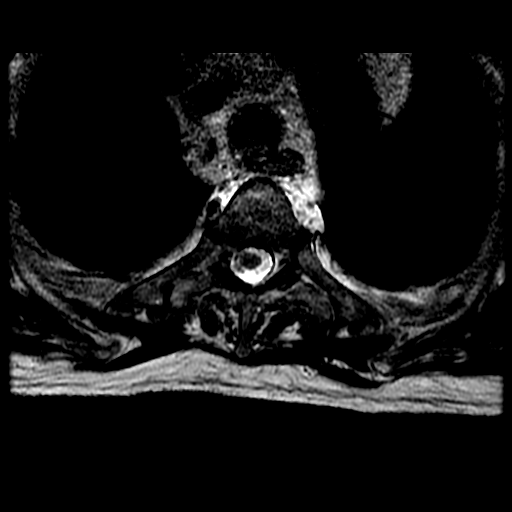
[im 25/30]
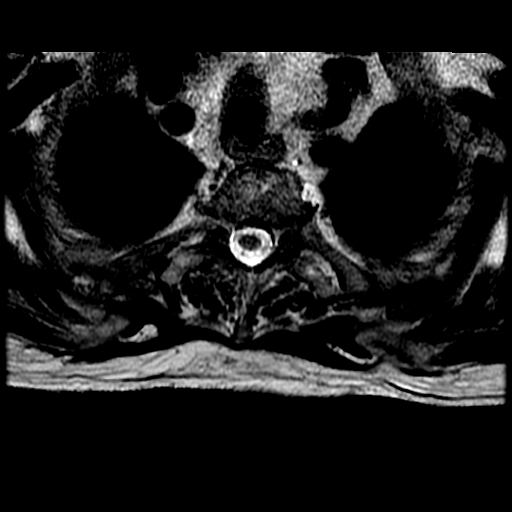
[im 30/30]
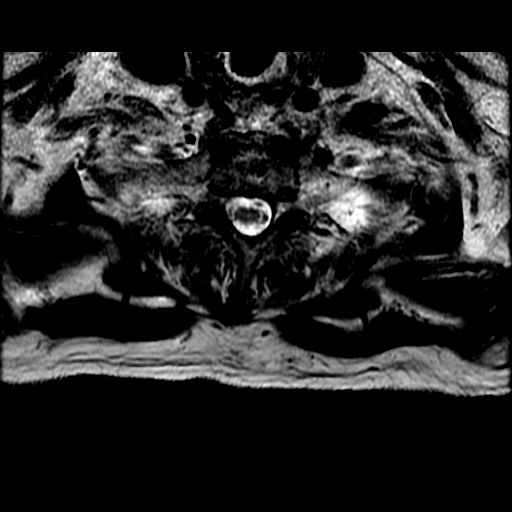

[Series 8: T2 · axial · 4.0mm · 0.39mm/px · z∈[-450,-285]mm · 5 of 36 slices shown (3 of 3)]
[im 1/36]
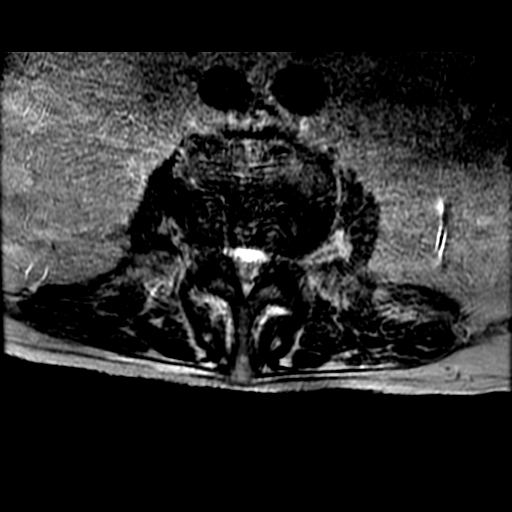
[im 5/36]
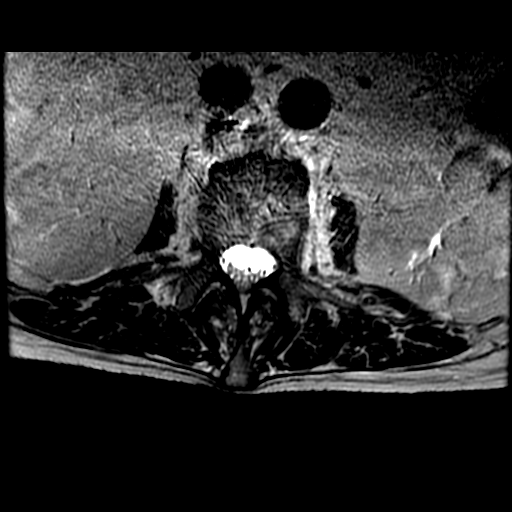
[im 9/36]
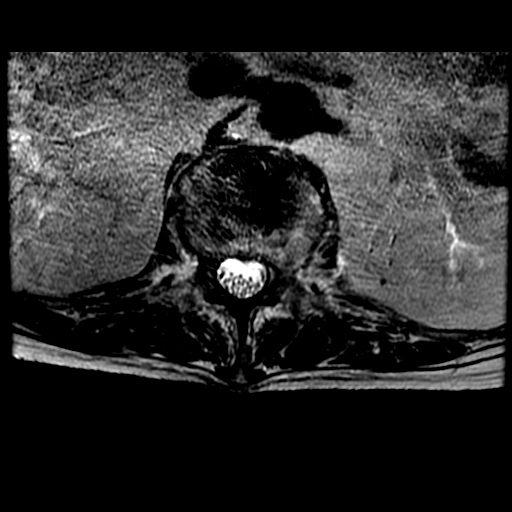
[im 18/36]
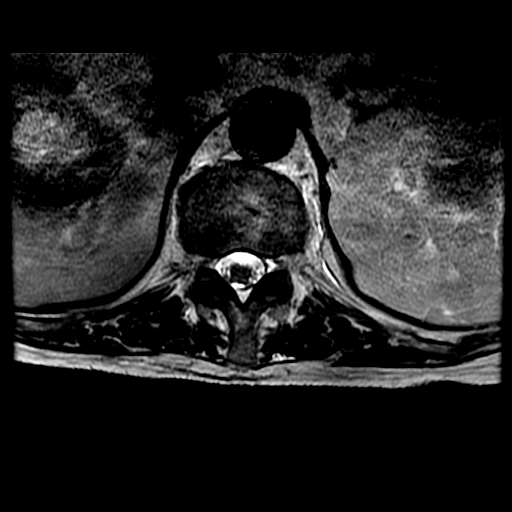
[im 31/36]
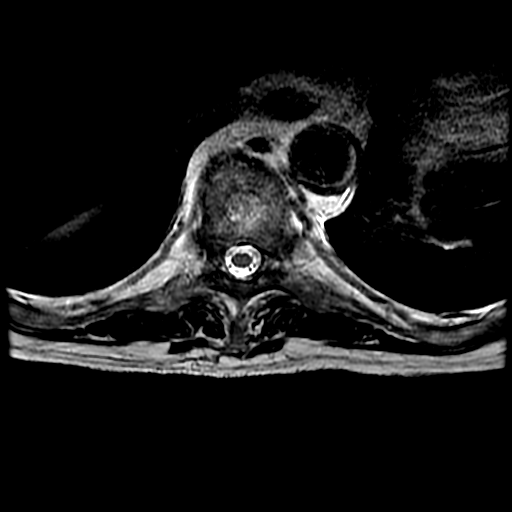

[19 of 48 positions shown; findings below may reference images not displayed]

FINDINGS: Alignment:  Normal

Vertebrae: Mild compression fractures T2, T3, T4, T5 with bone
marrow edema indicating recent fractures.

Mild compression fracture T12 vertebral body with diffuse bone
marrow edema consistent with recent fracture.

Chronic fracture L1 with kyphoplasty cement.

Negative for mass lesion.

Cord:  Normal cord signal.  No cord lesion or cord compression

Paraspinal and other soft tissues: Negative for paraspinous mass or
edema.

Disc levels:

Mild disc degeneration throughout the thoracic spine. No significant
spinal stenosis. Small left-sided disc protrusion T10-11.
IMPRESSION: Mild compression fractures T2, T3, T4, T5, T12. All these appear to
be recent benign fractures.

Chronic severe fracture of L1 with kyphoplasty cement.

Negative for spinal stenosis.

## 2020-07-01 ENCOUNTER — Other Ambulatory Visit: Payer: Self-pay | Admitting: Family Medicine

## 2020-07-01 DIAGNOSIS — R2230 Localized swelling, mass and lump, unspecified upper limb: Secondary | ICD-10-CM

## 2020-07-03 DIAGNOSIS — M1711 Unilateral primary osteoarthritis, right knee: Secondary | ICD-10-CM | POA: Diagnosis not present

## 2020-07-03 DIAGNOSIS — I10 Essential (primary) hypertension: Secondary | ICD-10-CM | POA: Diagnosis not present

## 2020-07-03 DIAGNOSIS — Z6826 Body mass index (BMI) 26.0-26.9, adult: Secondary | ICD-10-CM | POA: Diagnosis not present

## 2020-07-22 DIAGNOSIS — M1711 Unilateral primary osteoarthritis, right knee: Secondary | ICD-10-CM | POA: Diagnosis not present

## 2020-08-08 ENCOUNTER — Other Ambulatory Visit: Payer: Medicare Other

## 2020-08-15 ENCOUNTER — Other Ambulatory Visit: Payer: Self-pay

## 2020-08-15 ENCOUNTER — Encounter: Payer: Self-pay | Admitting: Cardiology

## 2020-08-15 ENCOUNTER — Ambulatory Visit: Payer: Medicare Other | Admitting: Cardiology

## 2020-08-15 VITALS — BP 142/58 | HR 54 | Ht 68.0 in | Wt 179.0 lb

## 2020-08-15 DIAGNOSIS — I4819 Other persistent atrial fibrillation: Secondary | ICD-10-CM | POA: Diagnosis not present

## 2020-08-15 NOTE — Progress Notes (Signed)
Electrophysiology Office Note   Date:  08/15/2020   ID:  Daniel Cooke, DOB 10/24/1924, MRN [id number]  PCP:  Shon Hale, MD  Electrophysiologist:  Regan Lemming, MD    No chief complaint on file.    History of Present Illness: Daniel Cooke is a 86 y.o. male who presents today for electrophysiology evaluation.   He has a history of hypertension, diabetes, and hyperlipidemia. He was found to be in atrial fibrillation after a fall where he suffered a vertebral fracture.  He was found to have a pinched nerve around that vertebra and is planned to have injections performed. He was admitted for a HF exacerbation with worsening DOE and edema. His weight had increased by 20 lbs. BNP was elevated at 1068. It was thought that his AF was contributing to his HF episode. He was not cardioverted as he was planned to have a second steroid injection in his back in January.  Today, denies symptoms of palpitations, chest pain, shortness of breath, orthopnea, PND, lower extremity edema, claudication, dizziness, presyncope, syncope, bleeding, or neurologic sequela. The patient is tolerating medications without difficulties.  Since last being seen he has done well.  He has no chest pain or shortness of breath.  Is able do all of his daily activities.  He is currently unaware of his atrial fibrillation with a plan for rate control.  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  Past Medical History:  Diagnosis Date  . Arthritis    "was in left knee before replacement; now have it in my right knee" (11/25/2016)  . BPH (benign prostatic hypertrophy)   . Chronic diastolic CHF (congestive heart failure) (HCC)    a. 02/2016 Echo: EF 60-65%, no rwma, triv AI, mild MR, mildly to mod dil LA, mod dil RA, PASP .  Marland Kitchen Dysrhythmia   . Essential hypertension   . Hard of hearing    wears bilateral hearing aids  . Hypercholesterolemia   .  Osteoarthritis   . PAF (paroxysmal atrial fibrillation) (HCC)    a. CHADS2VASC score of 4 --> coumadin.  . Pilonidal cyst    PAST HX - NO PROBLEM NOW  . Pneumonia    "one time; years ago" (11/25/2016)  . Shingles    "long time ago"  . Synovitis of knee 02/27/2014  . Type II diabetes mellitus (HCC)    oral meds - no insulin   Past Surgical History:  Procedure Laterality Date  . APPENDECTOMY    . CARDIOVERSION N/A 02/04/2017   Procedure: CARDIOVERSION;  Surgeon: [name], MD;  Location: [facility] ENDOSCOPY;  Service: Cardiovascular;  Laterality: N/A;  . CATARACT EXTRACTION W/ INTRAOCULAR LENS IMPLANT Left 10/2016  . CHOLECYSTECTOMY OPEN    . COLONOSCOPY W/ BIOPSIES  08/2005   [name] 04/27/2011  . EYE SURGERY Right    "right" traumatic cataract removed--injury to the eye--states his eyesight is ok in left eye  . JOINT REPLACEMENT    . KNEE ARTHROSCOPY Left 02/28/2014   Procedure: ARTHROSCOPY LEFT KNEE WITH SYNOVECTOMY;  Surgeon: [name], MD;  Location: [facility] ORS;  Service: Orthopedics;  Laterality: Left;  . KYPHOPLASTY  2017  . KYPHOPLASTY N/A 05/28/2017   Procedure: Lumbar Four Kyphoplasty;  Surgeon: Maeola Harman, MD;  Location: [facility] OR;  Service: Neurosurgery;  Laterality: N/A;  L4 Kyphoplasty  . SHOULDER OPEN ROTATOR CUFF REPAIR Left   . TONSILLECTOMY    .  TOTAL KNEE ARTHROPLASTY  02/08/2012   Procedure: TOTAL KNEE ARTHROPLASTY;  Surgeon: [name], MD;  Location: [facility] ORS;  Service: Orthopedics;  Laterality: Left;  Marland Kitchen VASECTOMY       Current Outpatient Medications  Medication Sig Dispense Refill  . acetaminophen (TYLENOL) 325 MG tablet Take 325 mg by mouth every 6 (six) hours as needed for mild pain.    Marland Kitchen CALCIUM PO Take 1 tablet by mouth daily.    . Cholecalciferol (VITAMIN D3 PO) Take 1 tablet by mouth daily.    . Continuous Blood Gluc Receiver (FREESTYLE LIBRE 14 DAY READER) DEVI See admin instructions.    Marland Kitchen ELIQUIS 2.5 MG TABS tablet Take 1 tablet by mouth twice  daily 180 tablet 1  . finasteride (PROSCAR) 5 MG tablet Take 5 mg by mouth daily.    . furosemide (LASIX) 40 MG tablet Take 1/2 (one-half) tablet by mouth once daily 45 tablet 3  . HUMALOG KWIKPEN 100 UNIT/ML KwikPen Inject 2 Units into the skin daily with supper.     . Insulin Glargine (LANTUS SOLOSTAR) 100 UNIT/ML Solostar Pen Inject 4 Units into the skin at bedtime. 15 mL 0  . Insulin Pen Needle (BD PEN NEEDLE NANO U/F) 32G X 4 MM MISC USE AS DIRECTED ONCE DAILY FOR 30 DAYS    . simvastatin (ZOCOR) 20 MG tablet Take 20 mg by mouth at bedtime.    . traZODone (DESYREL) 50 MG tablet TAKE 1 2 (ONE HALF) TABLET BY MOUTH ONCE DAILY AT BEDTIME AS NEEDED     No current facility-administered medications for this visit.    Allergies:   Codeine, Oxycodone, and Other   Social History:  The patient  reports that he has never smoked. He has never used smokeless tobacco. He reports that he does not drink alcohol and does not use drugs.   Family History:  The patient's family history includes Heart attack in his brother; Heart disease in his brother and mother.    ROS:  Please see the history of present illness.   Otherwise, review of systems is positive for none.   All other systems are reviewed and negative.   PHYSICAL EXAM: VS:  BP (!) 142/58   Pulse (!) 54   Ht 5\' 8"  (1.727 m)   Wt 179 lb (81.2 kg)   SpO2 96%   BMI 27.22 kg/m  , BMI Body mass index is 27.22 kg/m. GEN: Well nourished, well developed, in no acute distress  HEENT: normal  Neck: no JVD, carotid bruits, or masses Cardiac: Irregular; no murmurs, rubs, or gallops,no edema  Respiratory:  clear to auscultation bilaterally, normal work of breathing GI: soft, nontender, nondistended, + BS MS: no deformity or atrophy  Skin: warm and dry Neuro:  Strength and sensation are intact Psych: euthymic mood, full affect  EKG:  EKG is ordered today. Personal review of the ekg ordered shows atrial fibrillation, rate 51   Recent  Labs: 11/24/2019: ALT 27 11/25/2019: Hemoglobin 10.9; Platelets 222 11/26/2019: Magnesium 1.9 11/27/2019: BUN 27; Creatinine, Ser 1.49; Potassium 3.9; Sodium 140    Lipid Panel  No results found for: CHOL, TRIG, HDL, CHOLHDL, VLDL, LDLCALC, LDLDIRECT   Wt Readings from Last 3 Encounters:  08/15/20 179 lb (81.2 kg)  02/27/20 177 lb 6.4 oz (80.5 kg)  12/20/19 164 lb (74.4 kg)      Other studies Reviewed: Additional studies/ records that were reviewed today include: 12/2010 MPI  Review of the above records today demonstrates:  ECG  positive for ischemia, myoview with normal perfusion  11/26/16 TTE - Left ventricle: The cavity size was normal. Wall thickness was   increased in a pattern of moderate LVH. Systolic function was   normal. The estimated ejection fraction was in the range of 60%   to 65%. Wall motion was normal; there were no regional wall   motion abnormalities. - Mitral valve: There was moderate regurgitation. - Left atrium: The atrium was moderately dilated. - Right ventricle: The cavity size was mildly dilated. Wall   thickness was normal. - Right atrium: The atrium was severely dilated. - Tricuspid valve: There was moderate regurgitation. - Pulmonary arteries: Systolic pressure was moderately increased.   PA peak pressure: 65 mm Hg (S).  ASSESSMENT AND PLAN:  1.  Persistent atrial fibrillation: Currently on Eliquis with a CHA2DS2-VASc of at least 3.  He remains in atrial fibrillation.  No plans for cardioversion.  No changes.   2.  Diastolic heart failure: No obvious signs of volume overload  3.  Hyperlipidemia: Continue statin   Current medicines are reviewed at length with the patient today.   The patient does not have concerns regarding his medicines.  The following changes were made today: None  Labs/ tests ordered today include:  Orders Placed This Encounter  Procedures  . EKG 12-Lead     Disposition:   FU with Tyleigh Mahn 12  months  Signed, Hiroki Wint Jorja Loa, MD  08/15/2020 11:45 AM     [facility] [address] [place] [address] [phone] (office) [phone] (fax)

## 2020-08-28 DIAGNOSIS — M1711 Unilateral primary osteoarthritis, right knee: Secondary | ICD-10-CM | POA: Diagnosis not present

## 2020-08-29 DIAGNOSIS — M1711 Unilateral primary osteoarthritis, right knee: Secondary | ICD-10-CM | POA: Diagnosis not present

## 2020-08-30 DIAGNOSIS — M1711 Unilateral primary osteoarthritis, right knee: Secondary | ICD-10-CM | POA: Diagnosis not present

## 2020-09-05 ENCOUNTER — Ambulatory Visit
Admission: RE | Admit: 2020-09-05 | Discharge: 2020-09-05 | Disposition: A | Discharge status: Home / Self Care | Payer: Medicare Other | Source: Ambulatory Visit | Attending: Family Medicine | Admitting: Family Medicine

## 2020-09-05 ENCOUNTER — Other Ambulatory Visit: Payer: Self-pay

## 2020-09-05 DIAGNOSIS — S43432A Superior glenoid labrum lesion of left shoulder, initial encounter: Secondary | ICD-10-CM | POA: Diagnosis not present

## 2020-09-05 DIAGNOSIS — R2230 Localized swelling, mass and lump, unspecified upper limb: Secondary | ICD-10-CM

## 2020-09-09 DIAGNOSIS — E1165 Type 2 diabetes mellitus with hyperglycemia: Secondary | ICD-10-CM | POA: Diagnosis not present

## 2020-09-09 DIAGNOSIS — Z23 Encounter for immunization: Secondary | ICD-10-CM | POA: Diagnosis not present

## 2020-09-09 DIAGNOSIS — E7800 Pure hypercholesterolemia, unspecified: Secondary | ICD-10-CM | POA: Diagnosis not present

## 2020-09-09 DIAGNOSIS — Z0000 Encounter for general adult medical examination without abnormal findings: Secondary | ICD-10-CM | POA: Diagnosis not present

## 2020-09-17 DIAGNOSIS — M1711 Unilateral primary osteoarthritis, right knee: Secondary | ICD-10-CM | POA: Diagnosis not present

## 2020-09-17 DIAGNOSIS — I5023 Acute on chronic systolic (congestive) heart failure: Secondary | ICD-10-CM | POA: Diagnosis not present

## 2020-09-17 DIAGNOSIS — H35033 Hypertensive retinopathy, bilateral: Secondary | ICD-10-CM | POA: Diagnosis not present

## 2020-09-17 DIAGNOSIS — I4891 Unspecified atrial fibrillation: Secondary | ICD-10-CM | POA: Diagnosis not present

## 2020-09-17 DIAGNOSIS — N400 Enlarged prostate without lower urinary tract symptoms: Secondary | ICD-10-CM | POA: Diagnosis not present

## 2020-09-17 DIAGNOSIS — N184 Chronic kidney disease, stage 4 (severe): Secondary | ICD-10-CM | POA: Diagnosis not present

## 2020-09-17 DIAGNOSIS — E1122 Type 2 diabetes mellitus with diabetic chronic kidney disease: Secondary | ICD-10-CM | POA: Diagnosis not present

## 2020-09-17 DIAGNOSIS — E7800 Pure hypercholesterolemia, unspecified: Secondary | ICD-10-CM | POA: Diagnosis not present

## 2020-09-17 DIAGNOSIS — Z794 Long term (current) use of insulin: Secondary | ICD-10-CM | POA: Diagnosis not present

## 2020-09-17 DIAGNOSIS — F3289 Other specified depressive episodes: Secondary | ICD-10-CM | POA: Diagnosis not present

## 2020-09-17 DIAGNOSIS — I130 Hypertensive heart and chronic kidney disease with heart failure and stage 1 through stage 4 chronic kidney disease, or unspecified chronic kidney disease: Secondary | ICD-10-CM | POA: Diagnosis not present

## 2020-09-17 DIAGNOSIS — G8929 Other chronic pain: Secondary | ICD-10-CM | POA: Diagnosis not present

## 2020-09-17 DIAGNOSIS — E1165 Type 2 diabetes mellitus with hyperglycemia: Secondary | ICD-10-CM | POA: Diagnosis not present

## 2020-09-17 DIAGNOSIS — H9190 Unspecified hearing loss, unspecified ear: Secondary | ICD-10-CM | POA: Diagnosis not present

## 2020-09-17 DIAGNOSIS — M549 Dorsalgia, unspecified: Secondary | ICD-10-CM | POA: Diagnosis not present

## 2020-09-17 DIAGNOSIS — Z7901 Long term (current) use of anticoagulants: Secondary | ICD-10-CM | POA: Diagnosis not present

## 2020-09-30 ENCOUNTER — Other Ambulatory Visit: Payer: Self-pay

## 2020-09-30 ENCOUNTER — Encounter (INDEPENDENT_AMBULATORY_CARE_PROVIDER_SITE_OTHER): Payer: Medicare Other | Admitting: Ophthalmology

## 2020-09-30 DIAGNOSIS — H338 Other retinal detachments: Secondary | ICD-10-CM | POA: Diagnosis not present

## 2020-09-30 DIAGNOSIS — H4423 Degenerative myopia, bilateral: Secondary | ICD-10-CM | POA: Diagnosis not present

## 2020-09-30 DIAGNOSIS — H43813 Vitreous degeneration, bilateral: Secondary | ICD-10-CM

## 2020-09-30 DIAGNOSIS — I10 Essential (primary) hypertension: Secondary | ICD-10-CM | POA: Diagnosis not present

## 2020-09-30 DIAGNOSIS — H35033 Hypertensive retinopathy, bilateral: Secondary | ICD-10-CM | POA: Diagnosis not present

## 2020-10-01 DIAGNOSIS — M1711 Unilateral primary osteoarthritis, right knee: Secondary | ICD-10-CM | POA: Diagnosis not present

## 2020-10-01 DIAGNOSIS — R030 Elevated blood-pressure reading, without diagnosis of hypertension: Secondary | ICD-10-CM | POA: Diagnosis not present

## 2020-10-01 DIAGNOSIS — M47816 Spondylosis without myelopathy or radiculopathy, lumbar region: Secondary | ICD-10-CM | POA: Diagnosis not present

## 2020-10-17 DIAGNOSIS — I10 Essential (primary) hypertension: Secondary | ICD-10-CM | POA: Diagnosis not present

## 2020-10-18 ENCOUNTER — Other Ambulatory Visit: Payer: Self-pay | Admitting: Cardiology

## 2020-10-22 DIAGNOSIS — I130 Hypertensive heart and chronic kidney disease with heart failure and stage 1 through stage 4 chronic kidney disease, or unspecified chronic kidney disease: Secondary | ICD-10-CM | POA: Diagnosis not present

## 2020-10-22 DIAGNOSIS — M1711 Unilateral primary osteoarthritis, right knee: Secondary | ICD-10-CM | POA: Diagnosis not present

## 2020-10-22 DIAGNOSIS — I4891 Unspecified atrial fibrillation: Secondary | ICD-10-CM | POA: Diagnosis not present

## 2020-10-22 DIAGNOSIS — N400 Enlarged prostate without lower urinary tract symptoms: Secondary | ICD-10-CM | POA: Diagnosis not present

## 2020-10-22 DIAGNOSIS — E7800 Pure hypercholesterolemia, unspecified: Secondary | ICD-10-CM | POA: Diagnosis not present

## 2020-10-22 DIAGNOSIS — H35033 Hypertensive retinopathy, bilateral: Secondary | ICD-10-CM | POA: Diagnosis not present

## 2020-10-22 DIAGNOSIS — F3289 Other specified depressive episodes: Secondary | ICD-10-CM | POA: Diagnosis not present

## 2020-10-22 DIAGNOSIS — G8929 Other chronic pain: Secondary | ICD-10-CM | POA: Diagnosis not present

## 2020-10-22 DIAGNOSIS — H9190 Unspecified hearing loss, unspecified ear: Secondary | ICD-10-CM | POA: Diagnosis not present

## 2020-10-22 DIAGNOSIS — Z794 Long term (current) use of insulin: Secondary | ICD-10-CM | POA: Diagnosis not present

## 2020-10-22 DIAGNOSIS — Z7901 Long term (current) use of anticoagulants: Secondary | ICD-10-CM | POA: Diagnosis not present

## 2020-10-22 DIAGNOSIS — E1122 Type 2 diabetes mellitus with diabetic chronic kidney disease: Secondary | ICD-10-CM | POA: Diagnosis not present

## 2020-10-22 DIAGNOSIS — N184 Chronic kidney disease, stage 4 (severe): Secondary | ICD-10-CM | POA: Diagnosis not present

## 2020-10-22 DIAGNOSIS — M549 Dorsalgia, unspecified: Secondary | ICD-10-CM | POA: Diagnosis not present

## 2020-10-22 DIAGNOSIS — I5023 Acute on chronic systolic (congestive) heart failure: Secondary | ICD-10-CM | POA: Diagnosis not present

## 2020-10-22 DIAGNOSIS — E1165 Type 2 diabetes mellitus with hyperglycemia: Secondary | ICD-10-CM | POA: Diagnosis not present

## 2020-10-31 ENCOUNTER — Ambulatory Visit: Payer: Medicare Other | Admitting: Cardiology

## 2020-11-18 DIAGNOSIS — M47816 Spondylosis without myelopathy or radiculopathy, lumbar region: Secondary | ICD-10-CM | POA: Diagnosis not present

## 2020-11-28 DIAGNOSIS — M47816 Spondylosis without myelopathy or radiculopathy, lumbar region: Secondary | ICD-10-CM | POA: Diagnosis not present

## 2020-12-30 ENCOUNTER — Other Ambulatory Visit: Payer: Self-pay | Admitting: Cardiology

## 2020-12-30 DIAGNOSIS — I4819 Other persistent atrial fibrillation: Secondary | ICD-10-CM

## 2020-12-31 NOTE — Telephone Encounter (Signed)
Prescription refill request for Eliquis received. Indication: a fib Last office visit: 08/15/20 Scr: 1.69 Age: 87 Weight: 81kg

## 2021-01-07 DIAGNOSIS — M1711 Unilateral primary osteoarthritis, right knee: Secondary | ICD-10-CM | POA: Diagnosis not present

## 2021-01-07 DIAGNOSIS — M47816 Spondylosis without myelopathy or radiculopathy, lumbar region: Secondary | ICD-10-CM | POA: Diagnosis not present

## 2021-01-11 ENCOUNTER — Other Ambulatory Visit: Payer: Self-pay | Admitting: Cardiology

## 2021-02-20 DIAGNOSIS — M1711 Unilateral primary osteoarthritis, right knee: Secondary | ICD-10-CM | POA: Diagnosis not present

## 2021-03-10 DIAGNOSIS — I4891 Unspecified atrial fibrillation: Secondary | ICD-10-CM | POA: Diagnosis not present

## 2021-03-10 DIAGNOSIS — R296 Repeated falls: Secondary | ICD-10-CM | POA: Diagnosis not present

## 2021-03-10 DIAGNOSIS — I10 Essential (primary) hypertension: Secondary | ICD-10-CM | POA: Diagnosis not present

## 2021-03-10 DIAGNOSIS — E1165 Type 2 diabetes mellitus with hyperglycemia: Secondary | ICD-10-CM | POA: Diagnosis not present

## 2021-03-18 DIAGNOSIS — R7309 Other abnormal glucose: Secondary | ICD-10-CM | POA: Diagnosis not present

## 2021-03-18 DIAGNOSIS — M1711 Unilateral primary osteoarthritis, right knee: Secondary | ICD-10-CM | POA: Diagnosis not present

## 2021-03-18 DIAGNOSIS — M5416 Radiculopathy, lumbar region: Secondary | ICD-10-CM | POA: Diagnosis not present

## 2021-03-18 DIAGNOSIS — M47816 Spondylosis without myelopathy or radiculopathy, lumbar region: Secondary | ICD-10-CM | POA: Diagnosis not present

## 2021-03-24 DIAGNOSIS — Z96652 Presence of left artificial knee joint: Secondary | ICD-10-CM | POA: Diagnosis not present

## 2021-03-24 DIAGNOSIS — E1122 Type 2 diabetes mellitus with diabetic chronic kidney disease: Secondary | ICD-10-CM | POA: Diagnosis not present

## 2021-03-24 DIAGNOSIS — I130 Hypertensive heart and chronic kidney disease with heart failure and stage 1 through stage 4 chronic kidney disease, or unspecified chronic kidney disease: Secondary | ICD-10-CM | POA: Diagnosis not present

## 2021-03-24 DIAGNOSIS — H9190 Unspecified hearing loss, unspecified ear: Secondary | ICD-10-CM | POA: Diagnosis not present

## 2021-03-24 DIAGNOSIS — N400 Enlarged prostate without lower urinary tract symptoms: Secondary | ICD-10-CM | POA: Diagnosis not present

## 2021-03-24 DIAGNOSIS — N3281 Overactive bladder: Secondary | ICD-10-CM | POA: Diagnosis not present

## 2021-03-24 DIAGNOSIS — I5022 Chronic systolic (congestive) heart failure: Secondary | ICD-10-CM | POA: Diagnosis not present

## 2021-03-24 DIAGNOSIS — M1711 Unilateral primary osteoarthritis, right knee: Secondary | ICD-10-CM | POA: Diagnosis not present

## 2021-03-24 DIAGNOSIS — Z7901 Long term (current) use of anticoagulants: Secondary | ICD-10-CM | POA: Diagnosis not present

## 2021-03-24 DIAGNOSIS — E7800 Pure hypercholesterolemia, unspecified: Secondary | ICD-10-CM | POA: Diagnosis not present

## 2021-03-24 DIAGNOSIS — H35033 Hypertensive retinopathy, bilateral: Secondary | ICD-10-CM | POA: Diagnosis not present

## 2021-03-24 DIAGNOSIS — I4891 Unspecified atrial fibrillation: Secondary | ICD-10-CM | POA: Diagnosis not present

## 2021-03-24 DIAGNOSIS — F3289 Other specified depressive episodes: Secondary | ICD-10-CM | POA: Diagnosis not present

## 2021-03-24 DIAGNOSIS — Z794 Long term (current) use of insulin: Secondary | ICD-10-CM | POA: Diagnosis not present

## 2021-03-24 DIAGNOSIS — N184 Chronic kidney disease, stage 4 (severe): Secondary | ICD-10-CM | POA: Diagnosis not present

## 2021-03-24 DIAGNOSIS — E785 Hyperlipidemia, unspecified: Secondary | ICD-10-CM | POA: Diagnosis not present

## 2021-04-01 DIAGNOSIS — N401 Enlarged prostate with lower urinary tract symptoms: Secondary | ICD-10-CM | POA: Diagnosis not present

## 2021-04-01 DIAGNOSIS — R3915 Urgency of urination: Secondary | ICD-10-CM | POA: Diagnosis not present

## 2021-04-01 DIAGNOSIS — R351 Nocturia: Secondary | ICD-10-CM | POA: Diagnosis not present

## 2021-04-24 ENCOUNTER — Ambulatory Visit: Payer: Medicare Other | Admitting: Cardiology

## 2021-04-29 DIAGNOSIS — E785 Hyperlipidemia, unspecified: Secondary | ICD-10-CM | POA: Diagnosis not present

## 2021-04-29 DIAGNOSIS — H9190 Unspecified hearing loss, unspecified ear: Secondary | ICD-10-CM | POA: Diagnosis not present

## 2021-04-29 DIAGNOSIS — Z7901 Long term (current) use of anticoagulants: Secondary | ICD-10-CM | POA: Diagnosis not present

## 2021-04-29 DIAGNOSIS — M1711 Unilateral primary osteoarthritis, right knee: Secondary | ICD-10-CM | POA: Diagnosis not present

## 2021-04-29 DIAGNOSIS — N3281 Overactive bladder: Secondary | ICD-10-CM | POA: Diagnosis not present

## 2021-04-29 DIAGNOSIS — Z96652 Presence of left artificial knee joint: Secondary | ICD-10-CM | POA: Diagnosis not present

## 2021-04-29 DIAGNOSIS — E1122 Type 2 diabetes mellitus with diabetic chronic kidney disease: Secondary | ICD-10-CM | POA: Diagnosis not present

## 2021-04-29 DIAGNOSIS — I130 Hypertensive heart and chronic kidney disease with heart failure and stage 1 through stage 4 chronic kidney disease, or unspecified chronic kidney disease: Secondary | ICD-10-CM | POA: Diagnosis not present

## 2021-04-29 DIAGNOSIS — N400 Enlarged prostate without lower urinary tract symptoms: Secondary | ICD-10-CM | POA: Diagnosis not present

## 2021-04-29 DIAGNOSIS — I4891 Unspecified atrial fibrillation: Secondary | ICD-10-CM | POA: Diagnosis not present

## 2021-04-29 DIAGNOSIS — F3289 Other specified depressive episodes: Secondary | ICD-10-CM | POA: Diagnosis not present

## 2021-04-29 DIAGNOSIS — I5022 Chronic systolic (congestive) heart failure: Secondary | ICD-10-CM | POA: Diagnosis not present

## 2021-04-29 DIAGNOSIS — E7800 Pure hypercholesterolemia, unspecified: Secondary | ICD-10-CM | POA: Diagnosis not present

## 2021-04-29 DIAGNOSIS — N184 Chronic kidney disease, stage 4 (severe): Secondary | ICD-10-CM | POA: Diagnosis not present

## 2021-04-29 DIAGNOSIS — Z794 Long term (current) use of insulin: Secondary | ICD-10-CM | POA: Diagnosis not present

## 2021-04-29 DIAGNOSIS — H35033 Hypertensive retinopathy, bilateral: Secondary | ICD-10-CM | POA: Diagnosis not present

## 2021-05-01 DIAGNOSIS — I10 Essential (primary) hypertension: Secondary | ICD-10-CM | POA: Diagnosis not present

## 2021-05-06 DIAGNOSIS — I10 Essential (primary) hypertension: Secondary | ICD-10-CM | POA: Diagnosis not present

## 2021-05-20 ENCOUNTER — Emergency Department (HOSPITAL_COMMUNITY): Payer: Medicare Other

## 2021-05-20 ENCOUNTER — Inpatient Hospital Stay (HOSPITAL_COMMUNITY)
Admission: EM | Admit: 2021-05-20 | Discharge: 2021-05-24 | DRG: 178 | Disposition: A | Discharge status: Home / Self Care | Payer: Medicare Other | Attending: Internal Medicine | Admitting: Internal Medicine

## 2021-05-20 ENCOUNTER — Other Ambulatory Visit: Payer: Self-pay

## 2021-05-20 DIAGNOSIS — I4891 Unspecified atrial fibrillation: Secondary | ICD-10-CM | POA: Diagnosis not present

## 2021-05-20 DIAGNOSIS — N400 Enlarged prostate without lower urinary tract symptoms: Secondary | ICD-10-CM | POA: Diagnosis not present

## 2021-05-20 DIAGNOSIS — N1832 Chronic kidney disease, stage 3b: Secondary | ICD-10-CM | POA: Diagnosis present

## 2021-05-20 DIAGNOSIS — Z7901 Long term (current) use of anticoagulants: Secondary | ICD-10-CM | POA: Diagnosis not present

## 2021-05-20 DIAGNOSIS — I4819 Other persistent atrial fibrillation: Secondary | ICD-10-CM

## 2021-05-20 DIAGNOSIS — I5032 Chronic diastolic (congestive) heart failure: Secondary | ICD-10-CM | POA: Diagnosis present

## 2021-05-20 DIAGNOSIS — E1122 Type 2 diabetes mellitus with diabetic chronic kidney disease: Secondary | ICD-10-CM | POA: Diagnosis present

## 2021-05-20 DIAGNOSIS — Z8249 Family history of ischemic heart disease and other diseases of the circulatory system: Secondary | ICD-10-CM

## 2021-05-20 DIAGNOSIS — Z885 Allergy status to narcotic agent status: Secondary | ICD-10-CM

## 2021-05-20 DIAGNOSIS — Z974 Presence of external hearing-aid: Secondary | ICD-10-CM | POA: Diagnosis not present

## 2021-05-20 DIAGNOSIS — R059 Cough, unspecified: Secondary | ICD-10-CM | POA: Diagnosis not present

## 2021-05-20 DIAGNOSIS — N179 Acute kidney failure, unspecified: Secondary | ICD-10-CM

## 2021-05-20 DIAGNOSIS — U071 COVID-19: Principal | ICD-10-CM | POA: Diagnosis present

## 2021-05-20 DIAGNOSIS — H9190 Unspecified hearing loss, unspecified ear: Secondary | ICD-10-CM | POA: Diagnosis present

## 2021-05-20 DIAGNOSIS — I130 Hypertensive heart and chronic kidney disease with heart failure and stage 1 through stage 4 chronic kidney disease, or unspecified chronic kidney disease: Secondary | ICD-10-CM | POA: Diagnosis not present

## 2021-05-20 DIAGNOSIS — R531 Weakness: Secondary | ICD-10-CM | POA: Diagnosis not present

## 2021-05-20 DIAGNOSIS — D649 Anemia, unspecified: Secondary | ICD-10-CM | POA: Diagnosis present

## 2021-05-20 DIAGNOSIS — Z794 Long term (current) use of insulin: Secondary | ICD-10-CM | POA: Diagnosis not present

## 2021-05-20 DIAGNOSIS — E860 Dehydration: Secondary | ICD-10-CM | POA: Diagnosis not present

## 2021-05-20 DIAGNOSIS — R630 Anorexia: Secondary | ICD-10-CM | POA: Diagnosis not present

## 2021-05-20 DIAGNOSIS — Z888 Allergy status to other drugs, medicaments and biological substances status: Secondary | ICD-10-CM | POA: Diagnosis not present

## 2021-05-20 DIAGNOSIS — J069 Acute upper respiratory infection, unspecified: Secondary | ICD-10-CM | POA: Diagnosis not present

## 2021-05-20 DIAGNOSIS — N183 Chronic kidney disease, stage 3 unspecified: Secondary | ICD-10-CM | POA: Diagnosis present

## 2021-05-20 DIAGNOSIS — Z79899 Other long term (current) drug therapy: Secondary | ICD-10-CM | POA: Diagnosis not present

## 2021-05-20 DIAGNOSIS — E7800 Pure hypercholesterolemia, unspecified: Secondary | ICD-10-CM | POA: Diagnosis not present

## 2021-05-20 DIAGNOSIS — R17 Unspecified jaundice: Secondary | ICD-10-CM | POA: Diagnosis present

## 2021-05-20 DIAGNOSIS — E861 Hypovolemia: Secondary | ICD-10-CM | POA: Diagnosis present

## 2021-05-20 DIAGNOSIS — Z96652 Presence of left artificial knee joint: Secondary | ICD-10-CM | POA: Diagnosis present

## 2021-05-20 DIAGNOSIS — R0902 Hypoxemia: Secondary | ICD-10-CM | POA: Diagnosis present

## 2021-05-20 DIAGNOSIS — I700 Atherosclerosis of aorta: Secondary | ICD-10-CM | POA: Diagnosis not present

## 2021-05-20 DIAGNOSIS — R0689 Other abnormalities of breathing: Secondary | ICD-10-CM | POA: Diagnosis not present

## 2021-05-20 LAB — COMPREHENSIVE METABOLIC PANEL
ALT: 22 U/L (ref 0–44)
AST: 24 U/L (ref 15–41)
Albumin: 3.9 g/dL (ref 3.5–5.0)
Alkaline Phosphatase: 59 U/L (ref 38–126)
Anion gap: 10 (ref 5–15)
BUN: 48 mg/dL — ABNORMAL HIGH (ref 8–23)
CO2: 26 mmol/L (ref 22–32)
Calcium: 10.4 mg/dL — ABNORMAL HIGH (ref 8.9–10.3)
Chloride: 102 mmol/L (ref 98–111)
Creatinine, Ser: 2.15 mg/dL — ABNORMAL HIGH (ref 0.61–1.24)
GFR, Estimated: 28 mL/min — ABNORMAL LOW (ref >60)
Glucose, Bld: 278 mg/dL — ABNORMAL HIGH (ref 70–99)
Potassium: 4.3 mmol/L (ref 3.5–5.1)
Sodium: 138 mmol/L (ref 135–145)
Total Bilirubin: 3.1 mg/dL — ABNORMAL HIGH (ref 0.3–1.2)
Total Protein: 6.6 g/dL (ref 6.5–8.1)

## 2021-05-20 LAB — CBC WITH DIFFERENTIAL/PLATELET
Abs Immature Granulocytes: 0.03 10*3/uL (ref 0.00–0.07)
Basophils Absolute: 0.1 10*3/uL (ref 0.0–0.1)
Basophils Relative: 1 %
Eosinophils Absolute: 0 10*3/uL (ref 0.0–0.5)
Eosinophils Relative: 0 %
HCT: 33.4 % — ABNORMAL LOW (ref 39.0–52.0)
Hemoglobin: 11.2 g/dL — ABNORMAL LOW (ref 13.0–17.0)
Immature Granulocytes: 1 %
Lymphocytes Relative: 15 %
Lymphs Abs: 1 10*3/uL (ref 0.7–4.0)
MCH: 32.4 pg (ref 26.0–34.0)
MCHC: 33.5 g/dL (ref 30.0–36.0)
MCV: 96.5 fL (ref 80.0–100.0)
Monocytes Absolute: 0.6 10*3/uL (ref 0.1–1.0)
Monocytes Relative: 9 %
Neutro Abs: 4.8 10*3/uL (ref 1.7–7.7)
Neutrophils Relative %: 74 %
Platelets: 152 10*3/uL (ref 150–400)
RBC: 3.46 MIL/uL — ABNORMAL LOW (ref 4.22–5.81)
RDW: 12.9 % (ref 11.5–15.5)
WBC: 6.4 10*3/uL (ref 4.0–10.5)
nRBC: 0 % (ref 0.0–0.2)

## 2021-05-20 LAB — RESP PANEL BY RT-PCR (FLU A&B, COVID) ARPGX2
Influenza A by PCR: NEGATIVE
Influenza B by PCR: NEGATIVE
SARS Coronavirus 2 by RT PCR: POSITIVE — AB

## 2021-05-20 MED ORDER — ALBUTEROL SULFATE HFA 108 (90 BASE) MCG/ACT IN AERS
2.0000 | INHALATION_SPRAY | Freq: Once | RESPIRATORY_TRACT | Status: AC
  Administered 2021-05-21: 2 via RESPIRATORY_TRACT
  Filled 2021-05-20: qty 6.7

## 2021-05-20 MED ORDER — SODIUM CHLORIDE 0.9 % IV BOLUS
1000.0000 mL | Freq: Once | INTRAVENOUS | Status: DC
  Administered 2021-05-21: 1000 mL via INTRAVENOUS

## 2021-05-20 NOTE — ED Provider Notes (Signed)
[facility]-EMERGENCY DEPT Provider Note   CSN: [id number] Arrival date & time: 05/20/21  2045     History Chief Complaint  Patient presents with  . Weakness    Daniel Cooke is a 87 y.o. male.  Patient complains of cough and weakness.  Some shortness of breath  The history is provided by the patient and medical records. No language interpreter was used.  Weakness Severity:  Moderate Onset quality:  Sudden Timing:  Constant Progression:  Worsening Chronicity:  New Context: alcohol use   Relieved by:  Nothing Worsened by:  Nothing Associated symptoms: no abdominal pain, no chest pain, no cough, no diarrhea, no frequency, no headaches and no seizures        Past Medical History:  Diagnosis Date  . Arthritis    "was in left knee before replacement; now have it in my right knee" (11/25/2016)  . BPH (benign prostatic hypertrophy)   . Chronic diastolic CHF (congestive heart failure) (HCC)    a. 02/2016 Echo: EF 60-65%, no rwma, triv AI, mild MR, mildly to mod dil LA, mod dil RA, PASP .  Marland Kitchen Dysrhythmia   . Essential hypertension   . Hard of hearing    wears bilateral hearing aids  . Hypercholesterolemia   . Osteoarthritis   . PAF (paroxysmal atrial fibrillation) (HCC)    a. CHADS2VASC score of 4 --> coumadin.  . Pilonidal cyst    PAST HX - NO PROBLEM NOW  . Pneumonia    "one time; years ago" (11/25/2016)  . Shingles    "long time ago"  . Synovitis of knee 02/27/2014  . Type II diabetes mellitus (HCC)    oral meds - no insulin    Patient Active Problem List   Diagnosis Date Noted  . Acute respiratory disease due to COVID-19 virus 05/20/2021  . Acute kidney injury superimposed on chronic kidney disease (HCC) 11/24/2019  . Complicated urinary tract infection 11/24/2019  . Back pain 11/24/2019  . Generalized weakness 11/24/2019  . Persistent atrial fibrillation (HCC) 11/15/2019  . Acquired thrombophilia (HCC) 11/15/2019  .  Sensorineural hearing loss (SNHL) of both ears 11/08/2018  . Pain in left knee 07/28/2018  . Type II diabetes mellitus (HCC)   . Shingles   . Pneumonia   . Pilonidal cyst   . AF (paroxysmal atrial fibrillation) (HCC)   . Osteoarthritis   . Hypercholesterolemia   . Hard of hearing   . Essential hypertension   . Dysrhythmia   . Chronic diastolic CHF (congestive heart failure) (HCC)   . Arthritis   . Compression fracture of fourth lumbar vertebra (HCC) 05/28/2017  . Acute on chronic diastolic CHF (congestive heart failure) (HCC) 11/27/2016  . Stage III chronic kidney disease (HCC) 11/25/2016  . Anemia of chronic disease 11/25/2016  . HTN (hypertension) 11/25/2016  . HLD (hyperlipidemia) 11/25/2016  . Type 2 diabetes mellitus with stage 3 chronic kidney disease, without long-term current use of insulin (HCC) 11/25/2016  . Bilateral impacted cerumen 09/15/2016  . Encounter for therapeutic drug monitoring 03/20/2016  . Synovitis of knee 02/27/2014  . OA (osteoarthritis) of knee 02/08/2012    Past Surgical History:  Procedure Laterality Date  . APPENDECTOMY    . CARDIOVERSION N/A 02/04/2017   Procedure: CARDIOVERSION;  Surgeon: [name], MD;  Location: [facility] ENDOSCOPY;  Service: Cardiovascular;  Laterality: N/A;  . CATARACT EXTRACTION W/ INTRAOCULAR LENS IMPLANT Left 10/2016  . CHOLECYSTECTOMY OPEN    . COLONOSCOPY W/ BIOPSIES  08/2005   /  notes 04/27/2011  . EYE SURGERY Right    "right" traumatic cataract removed--injury to the eye--states his eyesight is ok in left eye  . JOINT REPLACEMENT    . KNEE ARTHROSCOPY Left 02/28/2014   Procedure: ARTHROSCOPY LEFT KNEE WITH SYNOVECTOMY;  Surgeon: [name], MD;  Location: [facility] ORS;  Service: Orthopedics;  Laterality: Left;  . KYPHOPLASTY  2017  . KYPHOPLASTY N/A 05/28/2017   Procedure: Lumbar Four Kyphoplasty;  Surgeon: Maeola Harman, MD;  Location: [facility] OR;  Service: Neurosurgery;  Laterality: N/A;  L4 Kyphoplasty  . SHOULDER OPEN  ROTATOR CUFF REPAIR Left   . TONSILLECTOMY    . TOTAL KNEE ARTHROPLASTY  02/08/2012   Procedure: TOTAL KNEE ARTHROPLASTY;  Surgeon: [name], MD;  Location: [facility] ORS;  Service: Orthopedics;  Laterality: Left;  Marland Kitchen VASECTOMY         Family History  Problem Relation Age of Onset  . Heart disease Mother   . Heart disease Brother   . Heart attack Brother     Social History   Tobacco Use  . Smoking status: Never Smoker  . Smokeless tobacco: Never Used  Vaping Use  . Vaping Use: Never used  Substance Use Topics  . Alcohol use: No  . Drug use: No    Home Medications Prior to Admission medications   Medication Sig Start Date End Date Taking? Authorizing Provider  acetaminophen (TYLENOL) 325 MG tablet Take 325 mg by mouth every 6 (six) hours as needed for mild pain.    [provider]  CALCIUM PO Take 1 tablet by mouth daily.    [provider]  Cholecalciferol (VITAMIN D3 PO) Take 1 tablet by mouth daily.    [provider]  Continuous Blood Gluc Receiver (FREESTYLE LIBRE 14 DAY READER) DEVI See admin instructions. 12/01/19   [provider]  ELIQUIS 2.5 MG TABS tablet Take 1 tablet by mouth twice daily 12/31/20   Camnitz, Andree Coss, MD  finasteride (PROSCAR) 5 MG tablet Take 5 mg by mouth daily. 05/01/19   [provider]  furosemide (LASIX) 40 MG tablet Take 1/2 (one-half) tablet by mouth once daily 01/13/21   Camnitz, Andree Coss, MD  HUMALOG KWIKPEN 100 UNIT/ML KwikPen Inject 2 Units into the skin daily with supper.  12/02/18   [provider]  Insulin Glargine (LANTUS SOLOSTAR) 100 UNIT/ML Solostar Pen Inject 4 Units into the skin at bedtime. 11/27/19   Lanae Boast, MD  Insulin Pen Needle (BD PEN NEEDLE NANO U/F) 32G X 4 MM MISC USE AS DIRECTED ONCE DAILY FOR 30 DAYS 10/28/18   [provider]  simvastatin (ZOCOR) 20 MG tablet Take 20 mg by mouth at bedtime.    [provider]  traZODone (DESYREL) 50 MG  tablet TAKE 1 2 (ONE HALF) TABLET BY MOUTH ONCE DAILY AT BEDTIME AS NEEDED 12/04/19   [provider]    Allergies    Codeine, Oxycodone, and Other  Review of Systems   Review of Systems  Constitutional: Negative for appetite change and fatigue.  HENT: Negative for congestion, ear discharge and sinus pressure.   Eyes: Negative for discharge.  Respiratory: Negative for cough.   Cardiovascular: Negative for chest pain.  Gastrointestinal: Negative for abdominal pain and diarrhea.  Genitourinary: Negative for frequency and hematuria.  Musculoskeletal: Negative for back pain.  Skin: Negative for rash.  Neurological: Positive for weakness. Negative for seizures and headaches.  Psychiatric/Behavioral: Negative for hallucinations.    Physical Exam Updated Vital Signs  BP 104/64   Pulse 63   Temp 98.3 F (36.8 C) (Oral)   Resp (!) 30   Ht 5\' 8"  (1.727 m)   Wt 81.6 kg   SpO2 92%   BMI 27.37 kg/m   Physical Exam Vitals and nursing note reviewed.  Constitutional:      Appearance: He is well-developed.  HENT:     Head: Normocephalic.     Nose: Nose normal.  Eyes:     General: No scleral icterus.    Conjunctiva/sclera: Conjunctivae normal.  Neck:     Thyroid: No thyromegaly.  Cardiovascular:     Rate and Rhythm: Normal rate and regular rhythm.     Heart sounds: No murmur heard. No friction rub. No gallop.   Pulmonary:     Breath sounds: No stridor. Wheezing present. No rales.     Comments: Tachypnea Chest:     Chest wall: No tenderness.  Abdominal:     General: There is no distension.     Tenderness: There is no abdominal tenderness. There is no rebound.  Musculoskeletal:        General: Normal range of motion.     Cervical back: Neck supple.  Lymphadenopathy:     Cervical: No cervical adenopathy.  Skin:    Findings: No erythema or rash.  Neurological:     Mental Status: He is alert and oriented to person, place, and time.     Motor: No abnormal muscle  tone.     Coordination: Coordination normal.  Psychiatric:        Behavior: Behavior normal.     ED Results / Procedures / Treatments   Labs (all labs ordered are listed, but only abnormal results are displayed) Labs Reviewed  RESP PANEL BY RT-PCR (FLU A&B, COVID) ARPGX2 - Abnormal; Notable for the following components:      Result Value   SARS Coronavirus 2 by RT PCR POSITIVE (*)    All other components within normal limits  CBC WITH DIFFERENTIAL/PLATELET - Abnormal; Notable for the following components:   RBC 3.46 (*)    Hemoglobin 11.2 (*)    HCT 33.4 (*)    All other components within normal limits  COMPREHENSIVE METABOLIC PANEL - Abnormal; Notable for the following components:   Glucose, Bld 278 (*)    BUN 48 (*)    Creatinine, Ser 2.15 (*)    Calcium 10.4 (*)    Total Bilirubin 3.1 (*)    GFR, Estimated 28 (*)    All other components within normal limits    EKG None  Radiology DG Chest Port 1 View  Result Date: 05/20/2021 CLINICAL DATA:  Cough and weakness. EXAM: PORTABLE CHEST 1 VIEW COMPARISON:  November 24, 2019 FINDINGS: Mild, stable areas of linear scarring are seen within the mid right lung and right lung base. There is no evidence of acute infiltrate, pleural effusion or pneumothorax. The heart size and mediastinal contours are within normal limits. Mild calcification of the aortic arch is seen. Radiopaque surgical clips are seen within the right upper quadrant. The visualized skeletal structures are unremarkable. IMPRESSION: Stable exam without acute cardiopulmonary disease. Electronically Signed   By: November 26, 2019 M.D.   On: 05/20/2021 22:40    Procedures Procedures   Medications Ordered in ED Medications  sodium chloride 0.9 % bolus 1,000 mL (has no administration in time range)  albuterol (VENTOLIN HFA) 108 (90 Base) MCG/ACT inhaler 2 puff (has no administration in time range)    ED Course  I have reviewed the triage vital signs and the nursing  notes.  Pertinent labs & imaging results that were available during my care of the patient were reviewed by me and considered in my medical decision making (see chart for details).    MDM Rules/Calculators/A&P                          Patient with AKI and COVID-19 infection causing tachypnea.  He will be admitted to medicine Final Clinical Impression(s) / ED Diagnoses Final diagnoses:  COVID-19    Rx / DC Orders ED Discharge Orders    None       Bethann BerkshireZammit, Woodley Petzold, MD 05/20/21 2355

## 2021-05-20 NOTE — ED Triage Notes (Addendum)
Patient is coming from home A&Ox4. Patient did not want to come but family wanted him to come. He finally agreed. Complaints of weakness and cough. His daughter has covid so mb exposure.    Hx: kidney infection, Afib  bp: 153/127 Hr: 104 RR: 40  Spo2: 92% RA End tidal: 26

## 2021-05-20 NOTE — ED Notes (Signed)
Granddaughter would like daughter, [name], updated regarding pt's bed status as soon as it is updated

## 2021-05-21 ENCOUNTER — Encounter (HOSPITAL_COMMUNITY): Payer: Self-pay | Admitting: Family Medicine

## 2021-05-21 LAB — LACTATE DEHYDROGENASE: LDH: 167 U/L (ref 98–192)

## 2021-05-21 LAB — CBC WITH DIFFERENTIAL/PLATELET
Abs Immature Granulocytes: 0.03 10*3/uL (ref 0.00–0.07)
Basophils Absolute: 0 10*3/uL (ref 0.0–0.1)
Basophils Relative: 0 %
Eosinophils Absolute: 0 10*3/uL (ref 0.0–0.5)
Eosinophils Relative: 0 %
HCT: 36.9 % — ABNORMAL LOW (ref 39.0–52.0)
Hemoglobin: 12.2 g/dL — ABNORMAL LOW (ref 13.0–17.0)
Immature Granulocytes: 1 %
Lymphocytes Relative: 12 %
Lymphs Abs: 0.8 10*3/uL (ref 0.7–4.0)
MCH: 32.5 pg (ref 26.0–34.0)
MCHC: 33.1 g/dL (ref 30.0–36.0)
MCV: 98.4 fL (ref 80.0–100.0)
Monocytes Absolute: 0.1 10*3/uL (ref 0.1–1.0)
Monocytes Relative: 2 %
Neutro Abs: 5.7 10*3/uL (ref 1.7–7.7)
Neutrophils Relative %: 85 %
Platelets: 146 10*3/uL — ABNORMAL LOW (ref 150–400)
RBC: 3.75 MIL/uL — ABNORMAL LOW (ref 4.22–5.81)
RDW: 12.9 % (ref 11.5–15.5)
WBC: 6.6 10*3/uL (ref 4.0–10.5)
nRBC: 0 % (ref 0.0–0.2)

## 2021-05-21 LAB — CBG MONITORING, ED
Glucose-Capillary: 217 mg/dL — ABNORMAL HIGH (ref 70–99)
Glucose-Capillary: 234 mg/dL — ABNORMAL HIGH (ref 70–99)
Glucose-Capillary: 311 mg/dL — ABNORMAL HIGH (ref 70–99)

## 2021-05-21 LAB — COMPREHENSIVE METABOLIC PANEL
ALT: 21 U/L (ref 0–44)
AST: 28 U/L (ref 15–41)
Albumin: 3.7 g/dL (ref 3.5–5.0)
Alkaline Phosphatase: 57 U/L (ref 38–126)
Anion gap: 9 (ref 5–15)
BUN: 46 mg/dL — ABNORMAL HIGH (ref 8–23)
CO2: 24 mmol/L (ref 22–32)
Calcium: 9.7 mg/dL (ref 8.9–10.3)
Chloride: 104 mmol/L (ref 98–111)
Creatinine, Ser: 1.82 mg/dL — ABNORMAL HIGH (ref 0.61–1.24)
GFR, Estimated: 34 mL/min — ABNORMAL LOW (ref >60)
Glucose, Bld: 301 mg/dL — ABNORMAL HIGH (ref 70–99)
Potassium: 4.3 mmol/L (ref 3.5–5.1)
Sodium: 137 mmol/L (ref 135–145)
Total Bilirubin: 1.9 mg/dL — ABNORMAL HIGH (ref 0.3–1.2)
Total Protein: 6.5 g/dL (ref 6.5–8.1)

## 2021-05-21 LAB — URINALYSIS, ROUTINE W REFLEX MICROSCOPIC
Bilirubin Urine: NEGATIVE
Glucose, UA: >=500 mg/dL — AB
Hgb urine dipstick: NEGATIVE
Ketones, ur: NEGATIVE mg/dL
Leukocytes,Ua: NEGATIVE
Nitrite: NEGATIVE
Protein, ur: 30 mg/dL — AB
Specific Gravity, Urine: 1.015 (ref 1.005–1.030)
pH: 5 (ref 5.0–8.0)

## 2021-05-21 LAB — FERRITIN: Ferritin: 159 ng/mL (ref 24–336)

## 2021-05-21 LAB — VITAMIN B12: Vitamin B-12: 1105 pg/mL — ABNORMAL HIGH (ref 180–914)

## 2021-05-21 LAB — D-DIMER, QUANTITATIVE: D-Dimer, Quant: 0.45 ug/mL-FEU (ref 0.00–0.50)

## 2021-05-21 LAB — MAGNESIUM: Magnesium: 2 mg/dL (ref 1.7–2.4)

## 2021-05-21 LAB — GLUCOSE, CAPILLARY
Glucose-Capillary: 255 mg/dL — ABNORMAL HIGH (ref 70–99)
Glucose-Capillary: 166 mg/dL — ABNORMAL HIGH (ref 70–99)

## 2021-05-21 LAB — C-REACTIVE PROTEIN: CRP: 4.3 mg/dL — ABNORMAL HIGH (ref <1.0)

## 2021-05-21 LAB — FOLATE: Folate: 34.5 ng/mL (ref >5.9)

## 2021-05-21 LAB — PROCALCITONIN: Procalcitonin: <0.1 ng/mL

## 2021-05-21 MED ORDER — INSULIN ASPART 100 UNIT/ML IJ SOLN
0.0000 [IU] | Freq: Every day | INTRAMUSCULAR | Status: DC
  Administered 2021-05-21: 2 [IU] via SUBCUTANEOUS
  Administered 2021-05-22: 3 [IU] via SUBCUTANEOUS
  Administered 2021-05-23: 2 [IU] via SUBCUTANEOUS
  Filled 2021-05-21: qty 0.05

## 2021-05-21 MED ORDER — SODIUM CHLORIDE 0.9 % IV SOLN: INTRAVENOUS | Status: DC

## 2021-05-21 MED ORDER — SODIUM CHLORIDE 0.9 % IV BOLUS
250.0000 mL | Freq: Once | INTRAVENOUS | Status: AC
  Administered 2021-05-21: 250 mL via INTRAVENOUS

## 2021-05-21 MED ORDER — GUAIFENESIN-DM 100-10 MG/5ML PO SYRP
10.0000 mL | ORAL_SOLUTION | ORAL | Status: DC | PRN
  Administered 2021-05-21: 10 mL via ORAL
  Filled 2021-05-21: qty 10

## 2021-05-21 MED ORDER — TRAZODONE HCL 50 MG PO TABS
25.0000 mg | ORAL_TABLET | Freq: Every evening | ORAL | Status: DC | PRN
  Administered 2021-05-22 – 2021-05-23 (×2): 25 mg via ORAL
  Filled 2021-05-21 (×2): qty 1

## 2021-05-21 MED ORDER — METHYLPREDNISOLONE SODIUM SUCC 125 MG IJ SOLR
0.5000 mg/kg | Freq: Two times a day (BID) | INTRAMUSCULAR | Status: DC
  Administered 2021-05-21 – 2021-05-22 (×5): 40.625 mg via INTRAVENOUS
  Filled 2021-05-21 (×5): qty 2

## 2021-05-21 MED ORDER — SODIUM CHLORIDE 0.9 % IV SOLN: 200.0000 mg | Freq: Once | INTRAVENOUS | Status: DC

## 2021-05-21 MED ORDER — APIXABAN 2.5 MG PO TABS
2.5000 mg | ORAL_TABLET | Freq: Two times a day (BID) | ORAL | Status: DC
  Administered 2021-05-21 – 2021-05-24 (×7): 2.5 mg via ORAL
  Filled 2021-05-21 (×8): qty 1

## 2021-05-21 MED ORDER — SODIUM CHLORIDE 0.9 % IV SOLN
100.0000 mg | Freq: Every day | INTRAVENOUS | Status: DC
  Administered 2021-05-22 – 2021-05-23 (×2): 100 mg via INTRAVENOUS
  Filled 2021-05-21 (×2): qty 20

## 2021-05-21 MED ORDER — SODIUM CHLORIDE 0.9 % IV SOLN
100.0000 mg | INTRAVENOUS | Status: AC
  Administered 2021-05-21 (×2): 100 mg via INTRAVENOUS
  Filled 2021-05-21 (×2): qty 20

## 2021-05-21 MED ORDER — SIMVASTATIN 20 MG PO TABS
20.0000 mg | ORAL_TABLET | Freq: Every day | ORAL | Status: DC
  Administered 2021-05-21 – 2021-05-23 (×3): 20 mg via ORAL
  Filled 2021-05-21 (×3): qty 1

## 2021-05-21 MED ORDER — FINASTERIDE 5 MG PO TABS
5.0000 mg | ORAL_TABLET | Freq: Every day | ORAL | Status: DC
  Administered 2021-05-21 – 2021-05-24 (×4): 5 mg via ORAL
  Filled 2021-05-21 (×4): qty 1

## 2021-05-21 MED ORDER — ONDANSETRON HCL 4 MG/2ML IJ SOLN: 4.0000 mg | Freq: Four times a day (QID) | INTRAMUSCULAR | Status: DC | PRN

## 2021-05-21 MED ORDER — SODIUM CHLORIDE 0.9 % IV SOLN: 100.0000 mg | Freq: Every day | INTRAVENOUS | Status: DC

## 2021-05-21 MED ORDER — PREDNISONE 50 MG PO TABS: 50.0000 mg | ORAL_TABLET | Freq: Every day | ORAL | Status: DC

## 2021-05-21 MED ORDER — ONDANSETRON HCL 4 MG PO TABS: 4.0000 mg | ORAL_TABLET | Freq: Four times a day (QID) | ORAL | Status: DC | PRN

## 2021-05-21 MED ORDER — LINAGLIPTIN 5 MG PO TABS
5.0000 mg | ORAL_TABLET | Freq: Every day | ORAL | Status: DC
  Administered 2021-05-21 – 2021-05-24 (×4): 5 mg via ORAL
  Filled 2021-05-21 (×4): qty 1

## 2021-05-21 MED ORDER — SENNOSIDES-DOCUSATE SODIUM 8.6-50 MG PO TABS: 1.0000 | ORAL_TABLET | Freq: Every evening | ORAL | Status: DC | PRN

## 2021-05-21 MED ORDER — ACETAMINOPHEN 325 MG PO TABS
650.0000 mg | ORAL_TABLET | Freq: Four times a day (QID) | ORAL | Status: DC | PRN
  Administered 2021-05-21: 650 mg via ORAL
  Filled 2021-05-21: qty 2

## 2021-05-21 MED ORDER — ALBUTEROL SULFATE HFA 108 (90 BASE) MCG/ACT IN AERS: 2.0000 | INHALATION_SPRAY | RESPIRATORY_TRACT | Status: DC | PRN

## 2021-05-21 MED ORDER — INSULIN ASPART 100 UNIT/ML IJ SOLN
0.0000 [IU] | Freq: Three times a day (TID) | INTRAMUSCULAR | Status: DC
  Administered 2021-05-21: 5 [IU] via SUBCUTANEOUS
  Administered 2021-05-21: 7 [IU] via SUBCUTANEOUS
  Administered 2021-05-21 – 2021-05-22 (×2): 3 [IU] via SUBCUTANEOUS
  Administered 2021-05-22: 9 [IU] via SUBCUTANEOUS
  Administered 2021-05-22 – 2021-05-23 (×3): 5 [IU] via SUBCUTANEOUS
  Administered 2021-05-23: 2 [IU] via SUBCUTANEOUS
  Administered 2021-05-24: 1 [IU] via SUBCUTANEOUS
  Filled 2021-05-21: qty 0.09

## 2021-05-21 MED ORDER — CHLORHEXIDINE GLUCONATE CLOTH 2 % EX PADS
6.0000 | MEDICATED_PAD | Freq: Every day | CUTANEOUS | Status: DC
  Administered 2021-05-22 – 2021-05-23 (×2): 6 via TOPICAL

## 2021-05-21 NOTE — Assessment & Plan Note (Addendum)
-   Possible recent exposure to daughter positive with COVID-19 approximately 1 week prior to admission (daughter denies direct contact however there may still have been some transmission even through fomites).  Timeline consistent with incubation period and exposure - COVID-19 test positive on admission -Started on remdesivir and steroids due to borderline hypoxia on admission - d/c'd steroids on 6/10 - continue remdesivir to complete 5 day course (stopped 1 day early due to BUN rising and LFTs rising) - repeat CMP at follow up

## 2021-05-21 NOTE — H&P (Signed)
History and Physical    Daniel Cooke QAS:[id number] DOB: 1924-02-06 DOA: 05/20/2021  PCP: Shon Hale, MD   Patient coming from: Home   Chief Complaint: Cough, fatigue, loss of appetite   HPI: Daniel Cooke is a 87 y.o. male with medical history significant for insulin-dependent diabetes mellitus, hypertension, atrial fibrillation on Eliquis, and chronic kidney disease stage IIIb, now presenting to the emergency department with cough, fatigue, and loss of appetite.  Patient's daughter reports that she was found to have COVID-19 infection approximately 1 week ago and that the patient was noted to develop a cough, loss of appetite, and increased fatigue last night.  Patient has no complaints and was very reluctant to be evaluated in the emergency department but came in at the insistence of his family.  He denies any chest pain, abdominal pain, nausea, vomiting, or diarrhea.  ED Course: Upon arrival to the ED, patient is found to be afebrile, saturating low 90s on room air, tachypneic, and with blood pressure 100/60.  Chest x-ray is negative for acute cardiopulmonary disease.  Chemistry panel notable for glucose 278 and creatinine 2.15.  CBC with mild normocytic anemia.  COVID-19 PCR was positive.  Patient was given a normal saline bolus and albuterol in the emergency department.  Review of Systems:  All other systems reviewed and apart from HPI, are negative.  Past Medical History:  Diagnosis Date  . Arthritis    "was in left knee before replacement; now have it in my right knee" (11/25/2016)  . BPH (benign prostatic hypertrophy)   . Chronic diastolic CHF (congestive heart failure) (HCC)    a. 02/2016 Echo: EF 60-65%, no rwma, triv AI, mild MR, mildly to mod dil LA, mod dil RA, PASP .  Marland Kitchen Dysrhythmia   . Essential hypertension   . Hard of hearing    wears bilateral hearing aids  . Hypercholesterolemia   . Osteoarthritis   . PAF (paroxysmal atrial fibrillation) (HCC)     a. CHADS2VASC score of 4 --> coumadin.  . Pilonidal cyst    PAST HX - NO PROBLEM NOW  . Pneumonia    "one time; years ago" (11/25/2016)  . Shingles    "long time ago"  . Synovitis of knee 02/27/2014  . Type II diabetes mellitus (HCC)    oral meds - no insulin    Past Surgical History:  Procedure Laterality Date  . APPENDECTOMY    . CARDIOVERSION N/A 02/04/2017   Procedure: CARDIOVERSION;  Surgeon: [name], MD;  Location: [facility] ENDOSCOPY;  Service: Cardiovascular;  Laterality: N/A;  . CATARACT EXTRACTION W/ INTRAOCULAR LENS IMPLANT Left 10/2016  . CHOLECYSTECTOMY OPEN    . COLONOSCOPY W/ BIOPSIES  08/2005   [name] 04/27/2011  . EYE SURGERY Right    "right" traumatic cataract removed--injury to the eye--states his eyesight is ok in left eye  . JOINT REPLACEMENT    . KNEE ARTHROSCOPY Left 02/28/2014   Procedure: ARTHROSCOPY LEFT KNEE WITH SYNOVECTOMY;  Surgeon: [name], MD;  Location: [facility] ORS;  Service: Orthopedics;  Laterality: Left;  . KYPHOPLASTY  2017  . KYPHOPLASTY N/A 05/28/2017   Procedure: Lumbar Four Kyphoplasty;  Surgeon: Maeola Harman, MD;  [facility];  Service: Neurosurgery;  Laterality: N/A;  L4 Kyphoplasty  . SHOULDER OPEN ROTATOR CUFF REPAIR Left   . TONSILLECTOMY    . TOTAL KNEE ARTHROPLASTY  02/08/2012   Procedure: TOTAL KNEE ARTHROPLASTY;  Surgeon: [name], MD;  Location: [facility] ORS;  Service:  Orthopedics;  Laterality: Left;  Marland Kitchen. VASECTOMY      Social History:   reports that he has never smoked. He has never used smokeless tobacco. He reports that he does not drink alcohol and does not use drugs.  Allergies  Allergen Reactions  . Codeine Nausea And Vomiting and Other (See Comments)    MAKES ME CRAZY  . Oxycodone Nausea And Vomiting and Other (See Comments)    MAKES ME CRAZY  . Other Other (See Comments)    All narcotic pain medicine makes him crazy.     Family History  Problem Relation Age of Onset  . Heart disease Mother   . Heart  disease Brother   . Heart attack Brother      Prior to Admission medications   Medication Sig Start Date End Date Taking? Authorizing Provider  acetaminophen (TYLENOL) 325 MG tablet Take 325 mg by mouth every 6 (six) hours as needed for mild pain.    [provider]  CALCIUM PO Take 1 tablet by mouth daily.    [provider]  Cholecalciferol (VITAMIN D3 PO) Take 1 tablet by mouth daily.    [provider]  Continuous Blood Gluc Receiver (FREESTYLE LIBRE 14 DAY READER) DEVI See admin instructions. 12/01/19   [provider]  ELIQUIS 2.5 MG TABS tablet Take 1 tablet by mouth twice daily 12/31/20   Camnitz, Andree CossWill Martin, MD  finasteride (PROSCAR) 5 MG tablet Take 5 mg by mouth daily. 05/01/19   [provider]  furosemide (LASIX) 40 MG tablet Take 1/2 (one-half) tablet by mouth once daily 01/13/21   Camnitz, Andree CossWill Martin, MD  HUMALOG KWIKPEN 100 UNIT/ML KwikPen Inject 2 Units into the skin daily with supper.  12/02/18   [provider]  Insulin Glargine (LANTUS SOLOSTAR) 100 UNIT/ML Solostar Pen Inject 4 Units into the skin at bedtime. 11/27/19   Lanae BoastKc, Ramesh, MD  Insulin Pen Needle (BD PEN NEEDLE NANO U/F) 32G X 4 MM MISC USE AS DIRECTED ONCE DAILY FOR 30 DAYS 10/28/18   [provider]  simvastatin (ZOCOR) 20 MG tablet Take 20 mg by mouth at bedtime.    [provider]  traZODone (DESYREL) 50 MG tablet TAKE 1 2 (ONE HALF) TABLET BY MOUTH ONCE DAILY AT BEDTIME AS NEEDED 12/04/19   [provider]    Physical Exam: Vitals:   05/20/21 2057 05/20/21 2200 05/20/21 2315  BP: 125/71 101/62 104/64  Pulse: (!) 101 97 63  Resp: (!) 26 (!) 25 (!) 30  Temp: 98.3 F (36.8 C)    TempSrc: Oral    SpO2: 91% 92% 92%  Weight: 81.6 kg    Height: 5\' 8"  (1.727 m)      Constitutional: NAD, calm  Eyes: PERTLA, lids and conjunctivae normal ENMT: Mucous membranes are moist. Posterior pharynx clear of any exudate or lesions.    Neck: normal, supple, no masses, no thyromegaly Respiratory: Tachypnea, no wheezing, no crackles. No accessory muscle use.  Cardiovascular: Rate ~80 and irregularly irregular. No extremity edema.  Abdomen: No distension, no tenderness, soft. Bowel sounds active.  Musculoskeletal: no clubbing / cyanosis. No joint deformity upper and lower extremities.   Skin: no significant rashes, lesions, ulcers. Warm, dry, well-perfused. Neurologic: Gross hearing deficit, no facial asymmetry. Sensation intact. Moving all extremities.  Psychiatric: Alert and oriented to person, place, and situation. Cooperative.    Labs and Imaging on Admission: I have personally reviewed following labs and imaging studies  CBC: Recent Labs  Lab  05/20/21 2140  WBC 6.4  NEUTROABS 4.8  HGB 11.2*  HCT 33.4*  MCV 96.5  PLT 152   Basic Metabolic Panel: Recent Labs  Lab 05/20/21 2140  NA 138  K 4.3  CL 102  CO2 26  GLUCOSE 278*  BUN 48*  CREATININE 2.15*  CALCIUM 10.4*   GFR: Estimated Creatinine Clearance: 19.9 mL/min (A) (by C-G formula based on SCr of 2.15 mg/dL (H)). Liver Function Tests: Recent Labs  Lab 05/20/21 2140  AST 24  ALT 22  ALKPHOS 59  BILITOT 3.1*  PROT 6.6  ALBUMIN 3.9   No results for input(s): LIPASE, AMYLASE in the last 168 hours. No results for input(s): AMMONIA in the last 168 hours. Coagulation Profile: No results for input(s): INR, PROTIME in the last 168 hours. Cardiac Enzymes: No results for input(s): CKTOTAL, CKMB, CKMBINDEX, TROPONINI in the last 168 hours. BNP (last 3 results) No results for input(s): PROBNP in the last 8760 hours. HbA1C: No results for input(s): HGBA1C in the last 72 hours. CBG: No results for input(s): GLUCAP in the last 168 hours. Lipid Profile: No results for input(s): CHOL, HDL, LDLCALC, TRIG, CHOLHDL, LDLDIRECT in the last 72 hours. Thyroid Function Tests: No results for input(s): TSH, T4TOTAL, FREET4, T3FREE, THYROIDAB in the last 72  hours. Anemia Panel: No results for input(s): VITAMINB12, FOLATE, FERRITIN, TIBC, IRON, RETICCTPCT in the last 72 hours. Urine analysis:    Component Value Date/Time   COLORURINE YELLOW 11/24/2019 1355   APPEARANCEUR CLEAR 11/24/2019 1355   LABSPEC 1.010 11/24/2019 1355   PHURINE 5.0 11/24/2019 1355   GLUCOSEU NEGATIVE 11/24/2019 1355   HGBUR MODERATE (A) 11/24/2019 1355   BILIRUBINUR NEGATIVE 11/24/2019 1355   KETONESUR NEGATIVE 11/24/2019 1355   PROTEINUR NEGATIVE 11/24/2019 1355   UROBILINOGEN 0.2 01/28/2012 1143   NITRITE NEGATIVE 11/24/2019 1355   LEUKOCYTESUR MODERATE (A) 11/24/2019 1355   Sepsis Labs: @LABRCNTIP (procalcitonin:4,lacticidven:4) ) Recent Results (from the past 240 hour(s))  Resp Panel by RT-PCR (Flu A&B, Covid) Nasopharyngeal Swab     Status: Abnormal   Collection Time: 05/20/21  9:05 PM   Specimen: Nasopharyngeal Swab; Nasopharyngeal(NP) swabs in vial transport medium  Result Value Ref Range Status   SARS Coronavirus 2 by RT PCR POSITIVE (A) NEGATIVE Final    Comment: RESULT CALLED TO, READ BACK BY AND VERIFIED WITH: HARRIS,K RN @2309  ON 05/20/21 JACKSON,K (NOTE) SARS-CoV-2 target nucleic acids are DETECTED.  The SARS-CoV-2 RNA is generally detectable in upper respiratory specimens during the acute phase of infection. Positive results are indicative of the presence of the identified virus, but do not rule out bacterial infection or co-infection with other pathogens not detected by the test. Clinical correlation with patient history and other diagnostic information is necessary to determine patient infection status. The expected result is Negative.  Fact Sheet for Patients:  Fact Sheet for Healthcare Providers: 07/20/21  This test is not yet approved or cleared by the [employer] FDA and  has been authorized for detection and/or diagnosis of SARS-CoV-2 by FDA under an  Emergency Use Authorization (EUA).  This EUA will remain in effect (meaning this test ca n be used) for the duration of  the COVID-19 declaration under Section 564(b)(1) of the Act, 21 U.S.C. section 360bbb-3(b)(1), unless the authorization is terminated or revoked sooner.     Influenza A by PCR NEGATIVE NEGATIVE Final   Influenza B by PCR NEGATIVE NEGATIVE Final    Comment: (NOTE) The Xpert Xpress SARS-CoV-2/FLU/RSV plus assay is intended  as an aid in the diagnosis of influenza from Nasopharyngeal swab specimens and should not be used as a sole basis for treatment. Nasal washings and aspirates are unacceptable for Xpert Xpress SARS-CoV-2/FLU/RSV testing.  Fact Sheet for Patients: [employer]com  Fact Sheet for Healthcare Providers: [employer]ious[employer]ker[employer]it  This test is not yet approved or cleared by the [place] FDA and has been authorized for detection and/or diagnosis of SARS-CoV-2 by FDA under an Emergency Use Authorization (EUA). This EUA will remain in effect (meaning this test can be used) for the duration of the COVID-19 declaration under Section 564(b)(1) of the Act, 21 U.S.C. section 360bbb-3(b)(1), unless the authorization is terminated or revoked.  Performed at [facility], [address] [place], [place] [address]      Radiological Exams on Admission: DG Chest Port 1 View  Result Date: 05/20/2021 CLINICAL DATA:  Cough and weakness. EXAM: PORTABLE CHEST 1 VIEW COMPARISON:  November 24, 2019 FINDINGS: Mild, stable areas of linear scarring are seen within the mid right lung and right lung base. There is no evidence of acute infiltrate, pleural effusion or pneumothorax. The heart size and mediastinal contours are within normal limits. Mild calcification of the aortic arch is seen. Radiopaque surgical clips are seen within the right upper quadrant. The visualized skeletal structures are unremarkable.  IMPRESSION: Stable exam without acute cardiopulmonary disease. Electronically Signed   By: Aram Candela M.D.   On: 05/20/2021 22:40    Assessment/Plan   1. COVID-19 infection  - Presents with 1 day of cough, fatigue, and loss of appetite and is found to have COVID-19 with clear CXR, O2 saturations low 90s on rm air, and tachypnea at rest  - Start remdesivir and systemic steroids, trend inflammatory markers, use supplemental O2 as-needed  2. AKI superimposed on CKD IIIb  - SCr is 2.15 on admission, up from 1.5 in 2020 - AKI vs progression to stage IV  - Appears hypovolemic in ED and was given a fluid bolus  - Hold Lasix, continue gentle IVF hydration overnight, repeat serum chemistries in am    3. Atrial fibrillation  - CHADS-VASc at least 4 (age x2, CHF, DM)   - Continue Eliquis   4. Chronic diastolic CHF  - Appears hypovolemic on admission  - Hold Lasix, hydrate gently overnight   5. Insulin-dependent DM  - A1c was 8.9% in December 2020  - Update A1c, use Tradjenta for now and continue CBG checks and insulin    6. Hyperbilirubinemia  - Total bilirubin 3.1 on admission, up from 2.0 in 2020  - No abdominal pain or GI symptoms, plan to monitor     DVT prophylaxis: Eliquis  Code Status: Full. Discussed with his daughter who wants him to be full code.  Level of Care: Level of care: Med-Surg Family Communication: Daughter, Nicola Police, updated by phone  Disposition Plan:  Patient is from: Home  Anticipated d/c is to: TBD Anticipated d/c date is: 05/23/21 Patient currently: Pending improvement in respiratory status  Consults called: None  Admission status: Inpatient     Briscoe Deutscher, MD [facility]  05/21/2021, 12:26 AM

## 2021-05-21 NOTE — Assessment & Plan Note (Signed)
-   CHADS-VASc at least 68 (age x2, CHF, DM)   - Continue Eliquis

## 2021-05-21 NOTE — Assessment & Plan Note (Addendum)
-   SCr is 2.15 on admission, up from 1.5 in 2020 - AKI vs progression to stage IV  - Appears hypovolemic in ED and was given a fluid bolus  - Hold Lasix, continue gentle IVF hydration on admission  -UA also shows hyaline casts consistent with dehydration, treated with IVF - creat 1.5 at discharge but BUN increased to 68 (no mentation changes).  - needs repeat CMP at follow up

## 2021-05-21 NOTE — Assessment & Plan Note (Addendum)
-   A1c 8.5% on admission - treated with SSI

## 2021-05-21 NOTE — Hospital Course (Addendum)
Daniel Cooke is a 87 y.o. male with medical history significant for insulin-dependent diabetes mellitus, hypertension, atrial fibrillation on Eliquis, and chronic kidney disease stage IIIb, presenting to the emergency department with cough, fatigue, and loss of appetite.    Patient's daughter reports that she was found to have COVID-19 infection approximately 1 week ago and that the patient was noted to develop a cough, loss of appetite, and increased fatigue last night (however daughter states he was not exposed directly to her).  Patient has no complaints and was very reluctant to be evaluated in the emergency department but came in at the insistence of his family.  He denies any chest pain, abdominal pain, nausea, vomiting, or diarrhea.  Upon arrival to the ED he was found to be afebrile, with saturations in the low 90s on room air, tachypneic and blood pressure 100/60. Chest x-ray is negative for acute cardiopulmonary disease.  Chemistry panel notable for glucose 278 and creatinine 2.15.  CBC with mild normocytic anemia.  COVID-19 PCR was positive.  Patient was given a normal saline bolus and albuterol in the emergency department.  See below for problem based plan.

## 2021-05-21 NOTE — Assessment & Plan Note (Addendum)
-   Appears hypovolemic on admission  - s/p IVF

## 2021-05-21 NOTE — Progress Notes (Signed)
Progress Note    SADAT SLIWA   ZOX:[id number]  DOB: [birth year]  DOA: 05/20/2021     1  PCP: Shon Hale, MD  CC: cough, fatigue, poor appetite   Hospital Course: DEARIS DANIS is a 87 y.o. male with medical history significant for insulin-dependent diabetes mellitus, hypertension, atrial fibrillation on Eliquis, and chronic kidney disease stage IIIb, presenting to the emergency department with cough, fatigue, and loss of appetite.    Patient's daughter reports that she was found to have COVID-19 infection approximately 1 week ago and that the patient was noted to develop a cough, loss of appetite, and increased fatigue last night (however daughter states he was not exposed directly to her).  Patient has no complaints and was very reluctant to be evaluated in the emergency department but came in at the insistence of his family.  He denies any chest pain, abdominal pain, nausea, vomiting, or diarrhea.  Upon arrival to the ED he was found to be afebrile, with saturations in the low 90s on room air, tachypneic and blood pressure 100/60. Chest x-ray is negative for acute cardiopulmonary disease.  Chemistry panel notable for glucose 278 and creatinine 2.15.  CBC with mild normocytic anemia.  COVID-19 PCR was positive.  Patient was given a normal saline bolus and albuterol in the emergency department.   Interval History:  Seen in the ER this morning.  Mostly complaining of ongoing fatigue.  His breathing is comfortable, mild cough, and remaining on room air. Called and spoke with his daughter [name] to give her an update this afternoon as well.  ROS: Constitutional: positive for fatigue and malaise, negative for chills and fevers, Respiratory: positive for cough, negative for sputum and wheezing, Cardiovascular: negative for chest pain and Gastrointestinal: negative for abdominal pain  Assessment & Plan: * Acute respiratory disease due to COVID-19 virus - Possible recent  exposure to daughter positive with COVID-19 approximately 1 week prior to admission (daughter denies direct contact however there may still have been some transmission even through fomites).  Timeline consistent with incubation period and exposure - COVID-19 test positive on admission -Started on remdesivir and steroids due to borderline hypoxia on admission - Follow-up inflammatory markers and continue trending - Initial labs: ferritin 159, LDH 167, Dimer 0.45, CRP 4.3, PCT negative - CT value: 22.6  Hyperbilirubinemia - Total bilirubin 3.1 on admission, up from 2.0 in 2020  - No abdominal pain or GI symptoms, plan to monitor    Acute renal failure superimposed on stage 3b chronic kidney disease (HCC) - SCr is 2.15 on admission, up from 1.5 in 2020 - AKI vs progression to stage IV  - Appears hypovolemic in ED and was given a fluid bolus  - Hold Lasix, continue gentle IVF hydration on admission   Persistent atrial fibrillation (HCC) - CHADS-VASc at least 42 (age x2, CHF, DM)   - Continue Eliquis   Chronic diastolic CHF (congestive heart failure) (HCC) - Appears hypovolemic on admission  - Hold Lasix  Type 2 diabetes mellitus with stage 3 chronic kidney disease, without long-term current use of insulin (HCC) - A1c was 8.9% in December 2020  - Update A1c, use Tradjenta for now and continue CBG checks and insulin     Old records reviewed in assessment of this patient  Antimicrobials: Remdesivir 05/20/2021 >> current  DVT prophylaxis: apixaban (ELIQUIS) tablet 2.5 mg Start: 05/21/21 1000 apixaban (ELIQUIS) tablet 2.5 mg   Code Status:   Code Status: Full Code Family Communication:  Disposition Plan: Status is: Inpatient  Remains inpatient appropriate because:IV treatments appropriate due to intensity of illness or inability to take PO and Inpatient level of care appropriate due to severity of illness   Dispo: The patient is from: Home              Anticipated d/c is to:  Home              Patient currently is not medically stable to d/c.   Difficult to place patient No  Risk of unplanned readmission score: Unplanned Admission- Pilot do not use: 13.39   Objective: Blood pressure 128/88, pulse 89, temperature 98.4 F (36.9 C), temperature source Oral, resp. rate 20, height 5\' 8"  (1.727 m), weight 81.6 kg, SpO2 95 %.  Examination: General appearance: alert, cooperative and fatigued Head: Normocephalic, without obvious abnormality, atraumatic Eyes: EOMI Lungs: Mild rhonchi appreciated right upper lobe.  No wheezing Heart: regular rate and rhythm, S1, S2 normal and 3/6 HSM best heard RUSB Abdomen: normal findings: bowel sounds normal and soft, non-tender Extremities: No edema Skin: mobility and turgor normal Neurologic: Grossly normal  Consultants:     Procedures:     Data Reviewed: I have personally reviewed following labs and imaging studies Results for orders placed or performed during the hospital encounter of 05/20/21 (from the past 24 hour(s))  Resp Panel by RT-PCR (Flu A&B, Covid) Nasopharyngeal Swab     Status: Abnormal   Collection Time: 05/20/21  9:05 PM   Specimen: Nasopharyngeal Swab; Nasopharyngeal(NP) swabs in vial transport medium  Result Value Ref Range   SARS Coronavirus 2 by RT PCR POSITIVE (A) NEGATIVE   Influenza A by PCR NEGATIVE NEGATIVE   Influenza B by PCR NEGATIVE NEGATIVE  CBC with Differential/Platelet     Status: Abnormal   Collection Time: 05/20/21  9:40 PM  Result Value Ref Range   WBC 6.4 4.0 - 10.5 K/uL   RBC 3.46 (L) 4.22 - 5.81 MIL/uL   Hemoglobin 11.2 (L) 13.0 - 17.0 g/dL   HCT 16.133.4 (L) 09.639.0 - 04.552.0 %   MCV 96.5 80.0 - 100.0 fL   MCH 32.4 26.0 - 34.0 pg   MCHC 33.5 30.0 - 36.0 g/dL   RDW 40.912.9 81.111.5 - 91.415.5 %   Platelets 152 150 - 400 K/uL   nRBC 0.0 0.0 - 0.2 %   Neutrophils Relative % 74 %   Neutro Abs 4.8 1.7 - 7.7 K/uL   Lymphocytes Relative 15 %   Lymphs Abs 1.0 0.7 - 4.0 K/uL   Monocytes Relative 9  %   Monocytes Absolute 0.6 0.1 - 1.0 K/uL   Eosinophils Relative 0 %   Eosinophils Absolute 0.0 0.0 - 0.5 K/uL   Basophils Relative 1 %   Basophils Absolute 0.1 0.0 - 0.1 K/uL   Immature Granulocytes 1 %   Abs Immature Granulocytes 0.03 0.00 - 0.07 K/uL  Comprehensive metabolic panel     Status: Abnormal   Collection Time: 05/20/21  9:40 PM  Result Value Ref Range   Sodium 138 135 - 145 mmol/L   Potassium 4.3 3.5 - 5.1 mmol/L   Chloride 102 98 - 111 mmol/L   CO2 26 22 - 32 mmol/L   Glucose, Bld 278 (H) 70 - 99 mg/dL   BUN 48 (H) 8 - 23 mg/dL   Creatinine, Ser 7.822.15 (H) 0.61 - 1.24 mg/dL   Calcium 95.610.4 (H) 8.9 - 10.3 mg/dL   Total Protein 6.6 6.5 - 8.1 g/dL  Albumin 3.9 3.5 - 5.0 g/dL   AST 24 15 - 41 U/L   ALT 22 0 - 44 U/L   Alkaline Phosphatase 59 38 - 126 U/L   Total Bilirubin 3.1 (H) 0.3 - 1.2 mg/dL   GFR, Estimated 28 (L) >60 mL/min   Anion gap 10 5 - 15  CBG monitoring, ED     Status: Abnormal   Collection Time: 05/21/21  2:28 AM  Result Value Ref Range   Glucose-Capillary 217 (H) 70 - 99 mg/dL  CBG monitoring, ED     Status: Abnormal   Collection Time: 05/21/21  7:52 AM  Result Value Ref Range   Glucose-Capillary 234 (H) 70 - 99 mg/dL  Procalcitonin - Baseline     Status: None   Collection Time: 05/21/21 10:00 AM  Result Value Ref Range   Procalcitonin <0.10 ng/mL  C-reactive protein     Status: Abnormal   Collection Time: 05/21/21 10:00 AM  Result Value Ref Range   CRP 4.3 (H) <1.0 mg/dL  CBC with Differential/Platelet     Status: Abnormal   Collection Time: 05/21/21 10:00 AM  Result Value Ref Range   WBC 6.6 4.0 - 10.5 K/uL   RBC 3.75 (L) 4.22 - 5.81 MIL/uL   Hemoglobin 12.2 (L) 13.0 - 17.0 g/dL   HCT 31.4 (L) 97.0 - 26.3 %   MCV 98.4 80.0 - 100.0 fL   MCH 32.5 26.0 - 34.0 pg   MCHC 33.1 30.0 - 36.0 g/dL   RDW 78.5 88.5 - 02.7 %   Platelets 146 (L) 150 - 400 K/uL   nRBC 0.0 0.0 - 0.2 %   Neutrophils Relative % 85 %   Neutro Abs 5.7 1.7 - 7.7 K/uL    Lymphocytes Relative 12 %   Lymphs Abs 0.8 0.7 - 4.0 K/uL   Monocytes Relative 2 %   Monocytes Absolute 0.1 0.1 - 1.0 K/uL   Eosinophils Relative 0 %   Eosinophils Absolute 0.0 0.0 - 0.5 K/uL   Basophils Relative 0 %   Basophils Absolute 0.0 0.0 - 0.1 K/uL   Immature Granulocytes 1 %   Abs Immature Granulocytes 0.03 0.00 - 0.07 K/uL  Comprehensive metabolic panel     Status: Abnormal   Collection Time: 05/21/21 10:00 AM  Result Value Ref Range   Sodium 137 135 - 145 mmol/L   Potassium 4.3 3.5 - 5.1 mmol/L   Chloride 104 98 - 111 mmol/L   CO2 24 22 - 32 mmol/L   Glucose, Bld 301 (H) 70 - 99 mg/dL   BUN 46 (H) 8 - 23 mg/dL   Creatinine, Ser 7.41 (H) 0.61 - 1.24 mg/dL   Calcium 9.7 8.9 - 28.7 mg/dL   Total Protein 6.5 6.5 - 8.1 g/dL   Albumin 3.7 3.5 - 5.0 g/dL   AST 28 15 - 41 U/L   ALT 21 0 - 44 U/L   Alkaline Phosphatase 57 38 - 126 U/L   Total Bilirubin 1.9 (H) 0.3 - 1.2 mg/dL   GFR, Estimated 34 (L) >60 mL/min   Anion gap 9 5 - 15  D-dimer, quantitative     Status: None   Collection Time: 05/21/21 10:00 AM  Result Value Ref Range   D-Dimer, Quant 0.45 0.00 - 0.50 ug/mL-FEU  Magnesium     Status: None   Collection Time: 05/21/21 10:00 AM  Result Value Ref Range   Magnesium 2.0 1.7 - 2.4 mg/dL  Lactate dehydrogenase     Status:  None   Collection Time: 05/21/21 10:00 AM  Result Value Ref Range   LDH 167 98 - 192 U/L  Ferritin     Status: None   Collection Time: 05/21/21 10:00 AM  Result Value Ref Range   Ferritin 159 24 - 336 ng/mL  Vitamin B12     Status: Abnormal   Collection Time: 05/21/21 10:00 AM  Result Value Ref Range   Vitamin B-12 1,105 (H) 180 - 914 pg/mL  Folate     Status: None   Collection Time: 05/21/21 10:00 AM  Result Value Ref Range   Folate 34.5 >5.9 ng/mL  CBG monitoring, ED     Status: Abnormal   Collection Time: 05/21/21 11:51 AM  Result Value Ref Range   Glucose-Capillary 311 (H) 70 - 99 mg/dL    Recent Results (from the past 240 hour(s))   Resp Panel by RT-PCR (Flu A&B, Covid) Nasopharyngeal Swab     Status: Abnormal   Collection Time: 05/20/21  9:05 PM   Specimen: Nasopharyngeal Swab; Nasopharyngeal(NP) swabs in vial transport medium  Result Value Ref Range Status   SARS Coronavirus 2 by RT PCR POSITIVE (A) NEGATIVE Final    Comment: RESULT CALLED TO, READ BACK BY AND VERIFIED WITH: HARRIS,K RN @2309  ON 05/20/21 JACKSON,K (NOTE) SARS-CoV-2 target nucleic acids are DETECTED.  The SARS-CoV-2 RNA is generally detectable in upper respiratory specimens during the acute phase of infection. Positive results are indicative of the presence of the identified virus, but do not rule out bacterial infection or co-infection with other pathogens not detected by the test. Clinical correlation with patient history and other diagnostic information is necessary to determine patient infection status. The expected result is Negative.  Fact Sheet for Patients: 07/20/21  Fact Sheet for Healthcare Providers: BloggerCourse.com  This test is not yet approved or cleared by the [employer] FDA and  has been authorized for detection and/or diagnosis of SARS-CoV-2 by FDA under an Emergency Use Authorization (EUA).  This EUA will remain in effect (meaning this test ca n be used) for the duration of  the COVID-19 declaration under Section 564(b)(1) of the Act, 21 U.S.C. section 360bbb-3(b)(1), unless the authorization is terminated or revoked sooner.     Influenza A by PCR NEGATIVE NEGATIVE Final   Influenza B by PCR NEGATIVE NEGATIVE Final    Comment: (NOTE) The Xpert Xpress SARS-CoV-2/FLU/RSV plus assay is intended as an aid in the diagnosis of influenza from Nasopharyngeal swab specimens and should not be used as a sole basis for treatment. Nasal washings and aspirates are unacceptable for Xpert Xpress SARS-CoV-2/FLU/RSV testing.  Fact Sheet for  Patients: [place]  Fact Sheet for Healthcare Providers: BloggerCourse.com  This test is not yet approved or cleared by the [employer] FDA and has been authorized for detection and/or diagnosis of SARS-CoV-2 by FDA under an Emergency Use Authorization (EUA). This EUA will remain in effect (meaning this test can be used) for the duration of the COVID-19 declaration under Section 564(b)(1) of the Act, 21 U.S.C. section 360bbb-3(b)(1), unless the authorization is terminated or revoked.  Performed at [facility], [address] [place], [place]      Radiology Studies: [facility] Chest Port 1 View  Result Date: 05/20/2021 CLINICAL DATA:  Cough and weakness. EXAM: PORTABLE CHEST 1 VIEW COMPARISON:  November 24, 2019 FINDINGS: Mild, stable areas of linear scarring are seen within the mid right lung and right lung base. There is no evidence of acute infiltrate, pleural  effusion or pneumothorax. The heart size and mediastinal contours are within normal limits. Mild calcification of the aortic arch is seen. Radiopaque surgical clips are seen within the right upper quadrant. The visualized skeletal structures are unremarkable. IMPRESSION: Stable exam without acute cardiopulmonary disease. Electronically Signed   By: Aram Candela M.D.   On: 05/20/2021 22:40   DG Chest Port 1 View  Final Result      Scheduled Meds: . apixaban  2.5 mg Oral BID  . finasteride  5 mg Oral Daily  . insulin aspart  0-5 Units Subcutaneous QHS  . insulin aspart  0-9 Units Subcutaneous TID WC  . linagliptin  5 mg Oral Daily  . methylPREDNISolone (SOLU-MEDROL) injection  0.5 mg/kg Intravenous BID   Followed by  . [START ON 05/24/2021] predniSONE  50 mg Oral Daily  . simvastatin  20 mg Oral QHS   PRN Meds: acetaminophen, albuterol, guaiFENesin-dextromethorphan, ondansetron **OR** ondansetron (ZOFRAN) IV, senna-docusate,  traZODone Continuous Infusions: . [START ON 05/22/2021] remdesivir 100 mg in NS 100 mL       LOS: 1 day  Time spent: Greater than 50% of the 35 minute visit was spent in counseling/coordination of care for the patient as laid out in the A&P.   Lewie Chamber, MD [facility] 05/21/2021, 4:15 PM

## 2021-05-21 NOTE — Plan of Care (Signed)
  Problem: Education: Goal: Knowledge of risk factors and measures for prevention of condition will improve Outcome: Progressing   Problem: Coping: Goal: Psychosocial and spiritual needs will be supported Outcome: Progressing   Problem: Respiratory: Goal: Will maintain a patent airway Outcome: Progressing Goal: Complications related to the disease process, condition or treatment will be avoided or minimized Outcome: Progressing   

## 2021-05-21 NOTE — Assessment & Plan Note (Addendum)
-   Total bilirubin 3.1 on admission, up from 2.0 in 2020  - No abdominal pain or GI symptoms, plan to monitor   - TB 0.8 at discharge

## 2021-05-21 NOTE — Progress Notes (Signed)
Inpatient Diabetes Program Recommendations  AACE/ADA: New Consensus Statement on Inpatient Glycemic Control (2015)  Target Ranges:  Prepandial:   less than 140 mg/dL      Peak postprandial:   less than 180 mg/dL (1-2 hours)      Critically ill patients:  140 - 180 mg/dL   Lab Results  Component Value Date   GLUCAP 311 (H) 05/21/2021   HGBA1C 8.9 (H) 11/24/2019    Review of Glycemic Control  Diabetes history: DM2 Outpatient Diabetes medications: Lantus 4 units QHS, Humalog 2 units QD with supper meal Current orders for Inpatient glycemic control: Novolog 0-9 units TID with meals and 0-5 HS, tradjenta 5 mg QD On Solumedrol 81.6 mg BID  Updated HgbA1C pending.  Inpatient Diabetes Program Recommendations:     Add Lantus 4 units QHS Add Novolog 2 units TID with meals if eating > 50% Add Carbohydrate mod to heart healthy diet  Will closely follow glucose trends daily.   Thank you. Ailene Ards, RD, LDN, CDE Inpatient Diabetes Coordinator [phone]

## 2021-05-22 LAB — CBC WITH DIFFERENTIAL/PLATELET
Abs Immature Granulocytes: 0.05 10*3/uL (ref 0.00–0.07)
Basophils Absolute: 0 10*3/uL (ref 0.0–0.1)
Basophils Relative: 0 %
Eosinophils Absolute: 0 10*3/uL (ref 0.0–0.5)
Eosinophils Relative: 0 %
HCT: 36.1 % — ABNORMAL LOW (ref 39.0–52.0)
Hemoglobin: 11.8 g/dL — ABNORMAL LOW (ref 13.0–17.0)
Immature Granulocytes: 1 %
Lymphocytes Relative: 14 %
Lymphs Abs: 1.2 10*3/uL (ref 0.7–4.0)
MCH: 32.2 pg (ref 26.0–34.0)
MCHC: 32.7 g/dL (ref 30.0–36.0)
MCV: 98.4 fL (ref 80.0–100.0)
Monocytes Absolute: 0.3 10*3/uL (ref 0.1–1.0)
Monocytes Relative: 3 %
Neutro Abs: 7.5 10*3/uL (ref 1.7–7.7)
Neutrophils Relative %: 82 %
Platelets: 156 10*3/uL (ref 150–400)
RBC: 3.67 MIL/uL — ABNORMAL LOW (ref 4.22–5.81)
RDW: 13 % (ref 11.5–15.5)
WBC: 9.1 10*3/uL (ref 4.0–10.5)
nRBC: 0 % (ref 0.0–0.2)

## 2021-05-22 LAB — BASIC METABOLIC PANEL
Anion gap: 8 (ref 5–15)
BUN: 53 mg/dL — ABNORMAL HIGH (ref 8–23)
CO2: 24 mmol/L (ref 22–32)
Calcium: 9.8 mg/dL (ref 8.9–10.3)
Chloride: 105 mmol/L (ref 98–111)
Creatinine, Ser: 1.93 mg/dL — ABNORMAL HIGH (ref 0.61–1.24)
GFR, Estimated: 31 mL/min — ABNORMAL LOW (ref >60)
Glucose, Bld: 261 mg/dL — ABNORMAL HIGH (ref 70–99)
Potassium: 4.3 mmol/L (ref 3.5–5.1)
Sodium: 137 mmol/L (ref 135–145)

## 2021-05-22 LAB — HEMOGLOBIN A1C
Hgb A1c MFr Bld: 8.5 % — ABNORMAL HIGH (ref 4.8–5.6)
Mean Plasma Glucose: 197 mg/dL

## 2021-05-22 LAB — C-REACTIVE PROTEIN: CRP: 4.8 mg/dL — ABNORMAL HIGH (ref <1.0)

## 2021-05-22 LAB — GLUCOSE, CAPILLARY
Glucose-Capillary: 244 mg/dL — ABNORMAL HIGH (ref 70–99)
Glucose-Capillary: 267 mg/dL — ABNORMAL HIGH (ref 70–99)
Glucose-Capillary: 381 mg/dL — ABNORMAL HIGH (ref 70–99)
Glucose-Capillary: 254 mg/dL — ABNORMAL HIGH (ref 70–99)

## 2021-05-22 LAB — D-DIMER, QUANTITATIVE: D-Dimer, Quant: 0.58 ug/mL-FEU — ABNORMAL HIGH (ref 0.00–0.50)

## 2021-05-22 LAB — LACTATE DEHYDROGENASE: LDH: 218 U/L — ABNORMAL HIGH (ref 98–192)

## 2021-05-22 LAB — MAGNESIUM: Magnesium: 2 mg/dL (ref 1.7–2.4)

## 2021-05-22 LAB — PROCALCITONIN: Procalcitonin: <0.1 ng/mL

## 2021-05-22 MED ORDER — INSULIN GLARGINE 100 UNIT/ML ~~LOC~~ SOLN
5.0000 [IU] | Freq: Every day | SUBCUTANEOUS | Status: DC
  Administered 2021-05-22 – 2021-05-24 (×3): 5 [IU] via SUBCUTANEOUS
  Filled 2021-05-22 (×3): qty 0.05

## 2021-05-22 MED ORDER — SODIUM CHLORIDE 0.9 % IV SOLN: INTRAVENOUS | Status: AC

## 2021-05-22 NOTE — Plan of Care (Signed)
  Problem: Education: Goal: Knowledge of risk factors and measures for prevention of condition will improve Outcome: Progressing   Problem: Coping: Goal: Psychosocial and spiritual needs will be supported Outcome: Progressing   Problem: Respiratory: Goal: Will maintain a patent airway Outcome: Progressing Goal: Complications related to the disease process, condition or treatment will be avoided or minimized Outcome: Progressing   

## 2021-05-22 NOTE — Progress Notes (Signed)
Progress Note    Daniel Cooke   HKV:[id number]  DOB: March 05, 1924  DOA: 05/20/2021     2  PCP: Daniel Hale, MD  CC: cough, fatigue, poor appetite   Hospital Course: Daniel Cooke is a 87 y.o. male with medical history significant for insulin-dependent diabetes mellitus, hypertension, atrial fibrillation on Eliquis, and chronic kidney disease stage IIIb, presenting to the emergency department with cough, fatigue, and loss of appetite.    Patient's daughter reports that she was found to have COVID-19 infection approximately 1 week ago and that the patient was noted to develop a cough, loss of appetite, and increased fatigue last night (however daughter states he was not exposed directly to her).  Patient has no complaints and was very reluctant to be evaluated in the emergency department but came in at the insistence of his family.  He denies any chest pain, abdominal pain, nausea, vomiting, or diarrhea.  Upon arrival to the ED he was found to be afebrile, with saturations in the low 90s on room air, tachypneic and blood pressure 100/60. Chest x-ray is negative for acute cardiopulmonary disease.  Chemistry panel notable for glucose 278 and creatinine 2.15.  CBC with mild normocytic anemia.  COVID-19 PCR was positive.  Patient was given a normal saline bolus and albuterol in the emergency department.   Interval History:  No events.  Feeling even more improved today.  Remains on room air.  Energy also improved. Called and spoke with his daughter Daniel Cooke again today.  We are tentatively on course for discharge on Saturday.  ROS: Constitutional: positive for fatigue and malaise, negative for chills and fevers, Respiratory: positive for cough, negative for sputum and wheezing, Cardiovascular: negative for chest pain and Gastrointestinal: negative for abdominal pain  Assessment & Plan: * Acute respiratory disease due to COVID-19 virus - Possible recent exposure to daughter positive  with COVID-19 approximately 1 week prior to admission (daughter denies direct contact however there may still have been some transmission even through fomites).  Timeline consistent with incubation period and exposure - COVID-19 test positive on admission -Started on remdesivir and steroids due to borderline hypoxia on admission - continue remdesivir to complete 5 day course  - Follow-up inflammatory markers and continue trending - Initial labs: ferritin 159, LDH 167, Dimer 0.45, CRP 4.3, PCT negative - CT value: 22.6 on admission  Acute renal failure superimposed on stage 3b chronic kidney disease (HCC) - SCr is 2.15 on admission, up from 1.5 in 2020 - AKI vs progression to stage IV  - Appears hypovolemic in ED and was given a fluid bolus  - Hold Lasix, continue gentle IVF hydration on admission  -UA also shows hyaline casts consistent with dehydration, continue fluids  Hyperbilirubinemia - Total bilirubin 3.1 on admission, up from 2.0 in 2020  - No abdominal pain or GI symptoms, plan to monitor    Persistent atrial fibrillation (HCC) - CHADS-VASc at least 55 (age x2, CHF, DM)   - Continue Eliquis   Chronic diastolic CHF (congestive heart failure) (HCC) - Appears hypovolemic on admission  - Hold Lasix  Type 2 diabetes mellitus with stage 3 chronic kidney disease, without long-term current use of insulin (HCC) - A1c 8.5% on admission - Continue Tradjenta - Continue SSI and CBG monitoring   Old records reviewed in assessment of this patient  Antimicrobials: Remdesivir 05/20/2021 >> current  DVT prophylaxis: apixaban (ELIQUIS) tablet 2.5 mg Start: 05/21/21 1000 apixaban (ELIQUIS) tablet 2.5 mg   Code Status:  Code Status: Full Code Family Communication:   Disposition Plan: Status is: Inpatient  Remains inpatient appropriate because:IV treatments appropriate due to intensity of illness or inability to take PO and Inpatient level of care appropriate due to severity of  illness   Dispo: The patient is from: Home              Anticipated d/c is to: Home              Patient currently is not medically stable to d/c.   Difficult to place patient No  Risk of unplanned readmission score: Unplanned Admission- Pilot do not use: 14.04   Objective: Blood pressure (!) 159/72, pulse 69, temperature (!) 97.5 F (36.4 C), temperature source Oral, resp. rate 14, height 5\' 8"  (1.727 m), weight 81.6 kg, SpO2 98 %.  Examination: General appearance: alert, cooperative and fatigued Head: Normocephalic, without obvious abnormality, atraumatic Eyes:  EOMI Lungs:  Mild rhonchi appreciated right upper lobe.  No wheezing Heart: regular rate and rhythm, S1, S2 normal and 3/6 HSM best heard RUSB Abdomen: normal findings: bowel sounds normal and soft, non-tender Extremities:  No edema Skin: mobility and turgor normal Neurologic: Grossly normal  Consultants:    Procedures:    Data Reviewed: I have personally reviewed following labs and imaging studies Results for orders placed or performed during the hospital encounter of 05/20/21 (from the past 24 hour(s))  Glucose, capillary     Status: Abnormal   Collection Time: 05/21/21  5:44 PM  Result Value Ref Range   Glucose-Capillary 255 (H) 70 - 99 mg/dL  Glucose, capillary     Status: Abnormal   Collection Time: 05/21/21  9:46 PM  Result Value Ref Range   Glucose-Capillary 166 (H) 70 - 99 mg/dL  Procalcitonin     Status: None   Collection Time: 05/22/21  4:20 AM  Result Value Ref Range   Procalcitonin <0.10 ng/mL  Lactate dehydrogenase     Status: Abnormal   Collection Time: 05/22/21  4:20 AM  Result Value Ref Range   LDH 218 (H) 98 - 192 U/L  Basic metabolic panel     Status: Abnormal   Collection Time: 05/22/21  4:20 AM  Result Value Ref Range   Sodium 137 135 - 145 mmol/L   Potassium 4.3 3.5 - 5.1 mmol/L   Chloride 105 98 - 111 mmol/L   CO2 24 22 - 32 mmol/L   Glucose, Bld 261 (H) 70 - 99 mg/dL   BUN 53  (H) 8 - 23 mg/dL   Creatinine, Ser 07/22/21 (H) 0.61 - 1.24 mg/dL   Calcium 9.8 8.9 - 6.44 mg/dL   GFR, Estimated 31 (L) >60 mL/min   Anion gap 8 5 - 15  CBC with Differential/Platelet     Status: Abnormal   Collection Time: 05/22/21  4:20 AM  Result Value Ref Range   WBC 9.1 4.0 - 10.5 K/uL   RBC 3.67 (L) 4.22 - 5.81 MIL/uL   Hemoglobin 11.8 (L) 13.0 - 17.0 g/dL   HCT 07/22/21 (L) 74.2 - 59.5 %   MCV 98.4 80.0 - 100.0 fL   MCH 32.2 26.0 - 34.0 pg   MCHC 32.7 30.0 - 36.0 g/dL   RDW 63.8 75.6 - 43.3 %   Platelets 156 150 - 400 K/uL   nRBC 0.0 0.0 - 0.2 %   Neutrophils Relative % 82 %   Neutro Abs 7.5 1.7 - 7.7 K/uL   Lymphocytes Relative 14 %   Lymphs  Abs 1.2 0.7 - 4.0 K/uL   Monocytes Relative 3 %   Monocytes Absolute 0.3 0.1 - 1.0 K/uL   Eosinophils Relative 0 %   Eosinophils Absolute 0.0 0.0 - 0.5 K/uL   Basophils Relative 0 %   Basophils Absolute 0.0 0.0 - 0.1 K/uL   Immature Granulocytes 1 %   Abs Immature Granulocytes 0.05 0.00 - 0.07 K/uL  D-dimer, quantitative     Status: Abnormal   Collection Time: 05/22/21  4:20 AM  Result Value Ref Range   D-Dimer, Quant 0.58 (H) 0.00 - 0.50 ug/mL-FEU  Magnesium     Status: None   Collection Time: 05/22/21  4:20 AM  Result Value Ref Range   Magnesium 2.0 1.7 - 2.4 mg/dL  C-reactive protein     Status: Abnormal   Collection Time: 05/22/21  4:20 AM  Result Value Ref Range   CRP 4.8 (H) <1.0 mg/dL  Glucose, capillary     Status: Abnormal   Collection Time: 05/22/21  7:23 AM  Result Value Ref Range   Glucose-Capillary 244 (H) 70 - 99 mg/dL  Glucose, capillary     Status: Abnormal   Collection Time: 05/22/21 11:10 AM  Result Value Ref Range   Glucose-Capillary 267 (H) 70 - 99 mg/dL  Glucose, capillary     Status: Abnormal   Collection Time: 05/22/21  4:06 PM  Result Value Ref Range   Glucose-Capillary 381 (H) 70 - 99 mg/dL    Recent Results (from the past 240 hour(s))  Resp Panel by RT-PCR (Flu A&B, Covid) Nasopharyngeal Swab      Status: Abnormal   Collection Time: 05/20/21  9:05 PM   Specimen: Nasopharyngeal Swab; Nasopharyngeal(NP) swabs in vial transport medium  Result Value Ref Range Status   SARS Coronavirus 2 by RT PCR POSITIVE (A) NEGATIVE Final    Comment: RESULT CALLED TO, READ BACK BY AND VERIFIED WITH: HARRIS,K RN @2309  ON 05/20/21 JACKSON,K (NOTE) SARS-CoV-2 target nucleic acids are DETECTED.  The SARS-CoV-2 RNA is generally detectable in upper respiratory specimens during the acute phase of infection. Positive results are indicative of the presence of the identified virus, but do not rule out bacterial infection or co-infection with other pathogens not detected by the test. Clinical correlation with patient history and other diagnostic information is necessary to determine patient infection status. The expected result is Negative.  Fact Sheet for Patients: 07/20/21  Fact Sheet for Healthcare Providers: BloggerCourse.com  This test is not yet approved or cleared by the SeriousBroker.it FDA and  has been authorized for detection and/or diagnosis of SARS-CoV-2 by FDA under an Emergency Use Authorization (EUA).  This EUA will remain in effect (meaning this test ca n be used) for the duration of  the COVID-19 declaration under Section 564(b)(1) of the Act, 21 U.S.C. section 360bbb-3(b)(1), unless the authorization is terminated or revoked sooner.     Influenza A by PCR NEGATIVE NEGATIVE Final   Influenza B by PCR NEGATIVE NEGATIVE Final    Comment: (NOTE) The Xpert Xpress SARS-CoV-2/FLU/RSV plus assay is intended as an aid in the diagnosis of influenza from Nasopharyngeal swab specimens and should not be used as a sole basis for treatment. Nasal washings and aspirates are unacceptable for Xpert Xpress SARS-CoV-2/FLU/RSV testing.  Fact Sheet for Patients: [place]  Fact Sheet for Healthcare  Providers: BloggerCourse.com  This test is not yet approved or cleared by the SeriousBroker.it FDA and has been authorized for detection and/or diagnosis of SARS-CoV-2 by FDA under an  Emergency Use Authorization (EUA). This EUA will remain in effect (meaning this test can be used) for the duration of the COVID-19 declaration under Section 564(b)(1) of the Act, 21 U.S.C. section 360bbb-3(b)(1), unless the authorization is terminated or revoked.  Performed at [facility], [address] [place], [place] [address]      [facility]  Result Date: 05/20/2021 CLINICAL DATA:  Cough and weakness. EXAM: PORTABLE CHEST 1 VIEW COMPARISON:  November 24, 2019 FINDINGS: Mild, stable areas of linear scarring are seen within the mid right lung and right lung base. There is no evidence of acute infiltrate, pleural effusion or pneumothorax. The heart size and mediastinal contours are within normal limits. Mild calcification of the aortic arch is seen. Radiopaque surgical clips are seen within the right upper quadrant. The visualized skeletal structures are unremarkable. IMPRESSION: Stable exam without acute cardiopulmonary disease. Electronically Signed   By: Aram Candelahaddeus  Houston M.D.   On: 05/20/2021 22:40    DG Chest Port 1 View  Final Result      Scheduled Meds:  apixaban  2.5 mg Oral BID   Chlorhexidine Gluconate Cloth  6 each Topical Daily   finasteride  5 mg Oral Daily   insulin aspart  0-5 Units Subcutaneous QHS   insulin aspart  0-9 Units Subcutaneous TID WC   insulin glargine  5 Units Subcutaneous Daily   linagliptin  5 mg Oral Daily   methylPREDNISolone (SOLU-MEDROL) injection  0.5 mg/kg Intravenous BID   Followed by   Melene Muller[START ON 05/24/2021] predniSONE  50 mg Oral Daily   simvastatin  20 mg Oral QHS   PRN Meds: acetaminophen, albuterol, guaiFENesin-dextromethorphan, ondansetron **OR** ondansetron (ZOFRAN) IV,  senna-docusate, traZODone Continuous Infusions:  sodium chloride 75 mL/hr at 05/22/21 0907   remdesivir 100 mg in NS 100 mL 100 mg (05/22/21 0913)     LOS: 2 days  Time spent: Greater than 50% of the 35 minute visit was spent in counseling/coordination of care for the patient as laid out in the A&P.   Lewie Chamberavid Shareka Casale, MD [facility] 05/22/2021, 4:44 PM

## 2021-05-22 NOTE — Progress Notes (Signed)
RN made Myrick,Gayle (Daughter)  [phone] (Mobile)  aware of pt room change.    Pt switched from rm [id number] to [id number]

## 2021-05-22 NOTE — Progress Notes (Addendum)
Alert and oriented x 3 with some confusion. Pt with urinary frequency and urgency needing to go to bathroom several times. Doesn't call for help or adhere to bed alarm which is in use. Continues on isolation for covid-19. No s/s of respiratory distress. Continue plan of care.

## 2021-05-23 LAB — GLUCOSE, CAPILLARY
Glucose-Capillary: 141 mg/dL — ABNORMAL HIGH (ref 70–99)
Glucose-Capillary: 163 mg/dL — ABNORMAL HIGH (ref 70–99)
Glucose-Capillary: 260 mg/dL — ABNORMAL HIGH (ref 70–99)
Glucose-Capillary: 259 mg/dL — ABNORMAL HIGH (ref 70–99)
Glucose-Capillary: 221 mg/dL — ABNORMAL HIGH (ref 70–99)

## 2021-05-23 LAB — CBC WITH DIFFERENTIAL/PLATELET
Abs Immature Granulocytes: 0.08 10*3/uL — ABNORMAL HIGH (ref 0.00–0.07)
Basophils Absolute: 0 10*3/uL (ref 0.0–0.1)
Basophils Relative: 0 %
Eosinophils Absolute: 0 10*3/uL (ref 0.0–0.5)
Eosinophils Relative: 0 %
HCT: 34.8 % — ABNORMAL LOW (ref 39.0–52.0)
Hemoglobin: 11.6 g/dL — ABNORMAL LOW (ref 13.0–17.0)
Immature Granulocytes: 1 %
Lymphocytes Relative: 10 %
Lymphs Abs: 1.4 10*3/uL (ref 0.7–4.0)
MCH: 32.4 pg (ref 26.0–34.0)
MCHC: 33.3 g/dL (ref 30.0–36.0)
MCV: 97.2 fL (ref 80.0–100.0)
Monocytes Absolute: 0.3 10*3/uL (ref 0.1–1.0)
Monocytes Relative: 2 %
Neutro Abs: 11.7 10*3/uL — ABNORMAL HIGH (ref 1.7–7.7)
Neutrophils Relative %: 87 %
Platelets: 163 10*3/uL (ref 150–400)
RBC: 3.58 MIL/uL — ABNORMAL LOW (ref 4.22–5.81)
RDW: 13 % (ref 11.5–15.5)
WBC: 13.5 10*3/uL — ABNORMAL HIGH (ref 4.0–10.5)
nRBC: 0 % (ref 0.0–0.2)

## 2021-05-23 LAB — D-DIMER, QUANTITATIVE: D-Dimer, Quant: 0.45 ug/mL-FEU (ref 0.00–0.50)

## 2021-05-23 LAB — BASIC METABOLIC PANEL
Anion gap: 7 (ref 5–15)
BUN: 61 mg/dL — ABNORMAL HIGH (ref 8–23)
CO2: 25 mmol/L (ref 22–32)
Calcium: 9.4 mg/dL (ref 8.9–10.3)
Chloride: 107 mmol/L (ref 98–111)
Creatinine, Ser: 1.52 mg/dL — ABNORMAL HIGH (ref 0.61–1.24)
GFR, Estimated: 42 mL/min — ABNORMAL LOW (ref >60)
Glucose, Bld: 112 mg/dL — ABNORMAL HIGH (ref 70–99)
Potassium: 4.4 mmol/L (ref 3.5–5.1)
Sodium: 139 mmol/L (ref 135–145)

## 2021-05-23 LAB — HEPATIC FUNCTION PANEL
ALT: 44 U/L (ref 0–44)
AST: 115 U/L — ABNORMAL HIGH (ref 15–41)
Albumin: 3.4 g/dL — ABNORMAL LOW (ref 3.5–5.0)
Alkaline Phosphatase: 46 U/L (ref 38–126)
Bilirubin, Direct: 0.2 mg/dL (ref 0.0–0.2)
Indirect Bilirubin: 0.8 mg/dL (ref 0.3–0.9)
Total Bilirubin: 1 mg/dL (ref 0.3–1.2)
Total Protein: 5.7 g/dL — ABNORMAL LOW (ref 6.5–8.1)

## 2021-05-23 LAB — PROCALCITONIN: Procalcitonin: <0.1 ng/mL

## 2021-05-23 LAB — MAGNESIUM: Magnesium: 2.1 mg/dL (ref 1.7–2.4)

## 2021-05-23 LAB — LACTATE DEHYDROGENASE: LDH: 238 U/L — ABNORMAL HIGH (ref 98–192)

## 2021-05-23 LAB — C-REACTIVE PROTEIN: CRP: 2.7 mg/dL — ABNORMAL HIGH (ref <1.0)

## 2021-05-23 MED ORDER — DEXAMETHASONE 4 MG PO TABS
6.0000 mg | ORAL_TABLET | Freq: Every day | ORAL | Status: DC
  Administered 2021-05-23 – 2021-05-24 (×2): 6 mg via ORAL
  Filled 2021-05-23 (×2): qty 1

## 2021-05-23 NOTE — Care Management Important Message (Signed)
Important Message  Patient Details IM Letter placed in Patient's room. Name: Daniel Cooke MRN: [id number] Date of Birth: February 04, 1924   Medicare Important Message Given:  Yes     Caren Macadam 05/23/2021, 11:41 AM

## 2021-05-23 NOTE — Progress Notes (Signed)
Progress Note    Daniel Cooke   ZOX:[id number]  DOB: May 12, 1924  DOA: 05/20/2021     3  PCP: Shon Hale, MD  CC: cough, fatigue, poor appetite   Hospital Course: Daniel Cooke is a 87 y.o. male with medical history significant for insulin-dependent diabetes mellitus, hypertension, atrial fibrillation on Eliquis, and chronic kidney disease stage IIIb, presenting to the emergency department with cough, fatigue, and loss of appetite.    Patient's daughter reports that she was found to have COVID-19 infection approximately 1 week ago and that the patient was noted to develop a cough, loss of appetite, and increased fatigue last night (however daughter states he was not exposed directly to her).  Patient has no complaints and was very reluctant to be evaluated in the emergency department but came in at the insistence of his family.  He denies any chest pain, abdominal pain, nausea, vomiting, or diarrhea.  Upon arrival to the ED he was found to be afebrile, with saturations in the low 90s on room air, tachypneic and blood pressure 100/60. Chest x-ray is negative for acute cardiopulmonary disease.  Chemistry panel notable for glucose 278 and creatinine 2.15.  CBC with mild normocytic anemia.  COVID-19 PCR was positive.  Patient was given a normal saline bolus and albuterol in the emergency department.   Interval History:  No events.  Hearing aids replaced since yesterday and he is hearing better this morning.  Feeling well and understands plan is for going home tomorrow.  ROS: Constitutional: positive for fatigue and malaise, negative for chills and fevers, Respiratory: positive for cough, negative for sputum and wheezing, Cardiovascular: negative for chest pain and Gastrointestinal: negative for abdominal pain  Assessment & Plan: * Acute respiratory disease due to COVID-19 virus - Possible recent exposure to daughter positive with COVID-19 approximately 1 week prior to  admission (daughter denies direct contact however there may still have been some transmission even through fomites).  Timeline consistent with incubation period and exposure - COVID-19 test positive on admission -Started on remdesivir and steroids due to borderline hypoxia on admission - d/c'd steroids on 6/10 - continue remdesivir to complete 5 day course  - Follow-up inflammatory markers and continue trending - Initial labs: ferritin 159, LDH 167, Dimer 0.45, CRP 4.3, PCT negative - CT value: 22.6 on admission  Acute renal failure superimposed on stage 3b chronic kidney disease (HCC) - SCr is 2.15 on admission, up from 1.5 in 2020 - AKI vs progression to stage IV  - Appears hypovolemic in ED and was given a fluid bolus  - Hold Lasix, continue gentle IVF hydration on admission  -UA also shows hyaline casts consistent with dehydration, continue fluids  Hyperbilirubinemia - Total bilirubin 3.1 on admission, up from 2.0 in 2020  - No abdominal pain or GI symptoms, plan to monitor    Persistent atrial fibrillation (HCC) - CHADS-VASc at least 37 (age x2, CHF, DM)   - Continue Eliquis   Chronic diastolic CHF (congestive heart failure) (HCC) - Appears hypovolemic on admission  - Hold Lasix  Type 2 diabetes mellitus with stage 3 chronic kidney disease, without long-term current use of insulin (HCC) - A1c 8.5% on admission - Continue Tradjenta - Continue SSI and CBG monitoring   Old records reviewed in assessment of this patient  Antimicrobials: Remdesivir 05/20/2021 >> current  DVT prophylaxis: apixaban (ELIQUIS) tablet 2.5 mg Start: 05/21/21 1000 apixaban (ELIQUIS) tablet 2.5 mg   Code Status:   Code Status: Full  Code Family Communication:   Disposition Plan: Status is: Inpatient  Remains inpatient appropriate because:IV treatments appropriate due to intensity of illness or inability to take PO and Inpatient level of care appropriate due to severity of illness   Dispo: The  patient is from: Home              Anticipated d/c is to: Home              Patient currently is not medically stable to d/c.   Difficult to place patient No  Risk of unplanned readmission score: Unplanned Admission- Pilot do not use: 13.92   Objective: Blood pressure 137/90, pulse 79, temperature (!) 97.5 F (36.4 C), temperature source Oral, resp. rate 18, height 5\' 8"  (1.727 m), weight 81.6 kg, SpO2 96 %.  Examination: General appearance: alert, cooperative and fatigued Head: Normocephalic, without obvious abnormality, atraumatic Eyes:  EOMI Lungs:  Mild rhonchi appreciated right upper lobe.  No wheezing Heart: regular rate and rhythm, S1, S2 normal and 3/6 HSM best heard RUSB Abdomen: normal findings: bowel sounds normal and soft, non-tender Extremities:  No edema Skin: mobility and turgor normal Neurologic: Grossly normal  Consultants:    Procedures:    Data Reviewed: I have personally reviewed following labs and imaging studies Results for orders placed or performed during the hospital encounter of 05/20/21 (from the past 24 hour(s))  Glucose, capillary     Status: Abnormal   Collection Time: 05/22/21  4:06 PM  Result Value Ref Range   Glucose-Capillary 381 (H) 70 - 99 mg/dL  Glucose, capillary     Status: Abnormal   Collection Time: 05/22/21  8:44 PM  Result Value Ref Range   Glucose-Capillary 254 (H) 70 - 99 mg/dL  Procalcitonin     Status: None   Collection Time: 05/23/21  4:19 AM  Result Value Ref Range   Procalcitonin <0.10 ng/mL  Lactate dehydrogenase     Status: Abnormal   Collection Time: 05/23/21  4:19 AM  Result Value Ref Range   LDH 238 (H) 98 - 192 U/L  Basic metabolic panel     Status: Abnormal   Collection Time: 05/23/21  4:19 AM  Result Value Ref Range   Sodium 139 135 - 145 mmol/L   Potassium 4.4 3.5 - 5.1 mmol/L   Chloride 107 98 - 111 mmol/L   CO2 25 22 - 32 mmol/L   Glucose, Bld 112 (H) 70 - 99 mg/dL   BUN 61 (H) 8 - 23 mg/dL    Creatinine, Ser 4.091.52 (H) 0.61 - 1.24 mg/dL   Calcium 9.4 8.9 - 81.110.3 mg/dL   GFR, Estimated 42 (L) >60 mL/min   Anion gap 7 5 - 15  CBC with Differential/Platelet     Status: Abnormal   Collection Time: 05/23/21  4:19 AM  Result Value Ref Range   WBC 13.5 (H) 4.0 - 10.5 K/uL   RBC 3.58 (L) 4.22 - 5.81 MIL/uL   Hemoglobin 11.6 (L) 13.0 - 17.0 g/dL   HCT 91.434.8 (L) 78.239.0 - 95.652.0 %   MCV 97.2 80.0 - 100.0 fL   MCH 32.4 26.0 - 34.0 pg   MCHC 33.3 30.0 - 36.0 g/dL   RDW 21.313.0 08.611.5 - 57.815.5 %   Platelets 163 150 - 400 K/uL   nRBC 0.0 0.0 - 0.2 %   Neutrophils Relative % 87 %   Neutro Abs 11.7 (H) 1.7 - 7.7 K/uL   Lymphocytes Relative 10 %   Lymphs Abs 1.4  0.7 - 4.0 K/uL   Monocytes Relative 2 %   Monocytes Absolute 0.3 0.1 - 1.0 K/uL   Eosinophils Relative 0 %   Eosinophils Absolute 0.0 0.0 - 0.5 K/uL   Basophils Relative 0 %   Basophils Absolute 0.0 0.0 - 0.1 K/uL   Immature Granulocytes 1 %   Abs Immature Granulocytes 0.08 (H) 0.00 - 0.07 K/uL  D-dimer, quantitative     Status: None   Collection Time: 05/23/21  4:19 AM  Result Value Ref Range   D-Dimer, Quant 0.45 0.00 - 0.50 ug/mL-FEU  Magnesium     Status: None   Collection Time: 05/23/21  4:19 AM  Result Value Ref Range   Magnesium 2.1 1.7 - 2.4 mg/dL  C-reactive protein     Status: Abnormal   Collection Time: 05/23/21  4:19 AM  Result Value Ref Range   CRP 2.7 (H) <1.0 mg/dL  Glucose, capillary     Status: Abnormal   Collection Time: 05/23/21  7:28 AM  Result Value Ref Range   Glucose-Capillary 141 (H) 70 - 99 mg/dL  Glucose, capillary     Status: Abnormal   Collection Time: 05/23/21  8:38 AM  Result Value Ref Range   Glucose-Capillary 163 (H) 70 - 99 mg/dL  Glucose, capillary     Status: Abnormal   Collection Time: 05/23/21 11:49 AM  Result Value Ref Range   Glucose-Capillary 260 (H) 70 - 99 mg/dL    Recent Results (from the past 240 hour(s))  Resp Panel by RT-PCR (Flu A&B, Covid) Nasopharyngeal Swab     Status:  Abnormal   Collection Time: 05/20/21  9:05 PM   Specimen: Nasopharyngeal Swab; Nasopharyngeal(NP) swabs in vial transport medium  Result Value Ref Range Status   SARS Coronavirus 2 by RT PCR POSITIVE (A) NEGATIVE Final    Comment: RESULT CALLED TO, READ BACK BY AND VERIFIED WITH: HARRIS,K RN @2309  ON 05/20/21 JACKSON,K (NOTE) SARS-CoV-2 target nucleic acids are DETECTED.  The SARS-CoV-2 RNA is generally detectable in upper respiratory specimens during the acute phase of infection. Positive results are indicative of the presence of the identified virus, but do not rule out bacterial infection or co-infection with other pathogens not detected by the test. Clinical correlation with patient history and other diagnostic information is necessary to determine patient infection status. The expected result is Negative.  Fact Sheet for Patients: 07/20/21  Fact Sheet for Healthcare Providers: BloggerCourse.com  This test is not yet approved or cleared by the [employer]ious[employer]ker.it FDA and  has been authorized for detection and/or diagnosis of SARS-CoV-2 by FDA under an Emergency Use Authorization (EUA).  This EUA will remain in effect (meaning this test ca n be used) for the duration of  the COVID-19 declaration under Section 564(b)(1) of the Act, 21 U.S.C. section 360bbb-3(b)(1), unless the authorization is terminated or revoked sooner.     Influenza A by PCR NEGATIVE NEGATIVE Final   Influenza B by PCR NEGATIVE NEGATIVE Final    Comment: (NOTE) The Xpert Xpress SARS-CoV-2/FLU/RSV plus assay is intended as an aid in the diagnosis of influenza from Nasopharyngeal swab specimens and should not be used as a sole basis for treatment. Nasal washings and aspirates are unacceptable for Xpert Xpress SARS-CoV-2/FLU/RSV testing.  Fact Sheet for Patients: [place]  Fact Sheet for Healthcare  Providers: BloggerCourse.com  This test is not yet approved or cleared by the [employer]ious[employer]ker.it FDA and has been authorized for detection and/or diagnosis of SARS-CoV-2 by FDA under an Emergency Use  Authorization (EUA). This EUA will remain in effect (meaning this test can be used) for the duration of the COVID-19 declaration under Section 564(b)(1) of the Act, 21 U.S.C. section 360bbb-3(b)(1), unless the authorization is terminated or revoked.  Performed at [facility], [address] [place], [place] [address]      Radiology Studies: No results found.  DG Chest Port 1 View  Final Result      Scheduled Meds:  apixaban  2.5 mg Oral BID   Chlorhexidine Gluconate Cloth  6 each Topical Daily   dexamethasone  6 mg Oral Daily   finasteride  5 mg Oral Daily   insulin aspart  0-5 Units Subcutaneous QHS   insulin aspart  0-9 Units Subcutaneous TID WC   insulin glargine  5 Units Subcutaneous Daily   linagliptin  5 mg Oral Daily   simvastatin  20 mg Oral QHS   PRN Meds: acetaminophen, albuterol, guaiFENesin-dextromethorphan, ondansetron **OR** ondansetron (ZOFRAN) IV, senna-docusate, traZODone Continuous Infusions:  remdesivir 100 mg in NS 100 mL 100 mg (05/23/21 0909)     LOS: 3 days  Time spent: Greater than 50% of the 35 minute visit was spent in counseling/coordination of care for the patient as laid out in the A&P.   Lewie Chamber, MD [facility] 05/23/2021, 2:11 PM

## 2021-05-23 NOTE — Plan of Care (Signed)
  Problem: Education: Goal: Knowledge of risk factors and measures for prevention of condition will improve Outcome: Progressing   Problem: Coping: Goal: Psychosocial and spiritual needs will be supported Outcome: Progressing   Problem: Respiratory: Goal: Will maintain a patent airway Outcome: Progressing Goal: Complications related to the disease process, condition or treatment will be avoided or minimized Outcome: Progressing   

## 2021-05-24 LAB — CBC WITH DIFFERENTIAL/PLATELET
Abs Immature Granulocytes: 0.11 10*3/uL — ABNORMAL HIGH (ref 0.00–0.07)
Basophils Absolute: 0 10*3/uL (ref 0.0–0.1)
Basophils Relative: 0 %
Eosinophils Absolute: 0 10*3/uL (ref 0.0–0.5)
Eosinophils Relative: 0 %
HCT: 34.1 % — ABNORMAL LOW (ref 39.0–52.0)
Hemoglobin: 11.3 g/dL — ABNORMAL LOW (ref 13.0–17.0)
Immature Granulocytes: 1 %
Lymphocytes Relative: 11 %
Lymphs Abs: 1.4 10*3/uL (ref 0.7–4.0)
MCH: 32.4 pg (ref 26.0–34.0)
MCHC: 33.1 g/dL (ref 30.0–36.0)
MCV: 97.7 fL (ref 80.0–100.0)
Monocytes Absolute: 0.6 10*3/uL (ref 0.1–1.0)
Monocytes Relative: 5 %
Neutro Abs: 10.1 10*3/uL — ABNORMAL HIGH (ref 1.7–7.7)
Neutrophils Relative %: 83 %
Platelets: 163 10*3/uL (ref 150–400)
RBC: 3.49 MIL/uL — ABNORMAL LOW (ref 4.22–5.81)
RDW: 13 % (ref 11.5–15.5)
WBC: 12.1 10*3/uL — ABNORMAL HIGH (ref 4.0–10.5)
nRBC: 0 % (ref 0.0–0.2)

## 2021-05-24 LAB — COMPREHENSIVE METABOLIC PANEL
ALT: 65 U/L — ABNORMAL HIGH (ref 0–44)
AST: 97 U/L — ABNORMAL HIGH (ref 15–41)
Albumin: 3.5 g/dL (ref 3.5–5.0)
Alkaline Phosphatase: 47 U/L (ref 38–126)
Anion gap: 9 (ref 5–15)
BUN: 68 mg/dL — ABNORMAL HIGH (ref 8–23)
CO2: 26 mmol/L (ref 22–32)
Calcium: 9.7 mg/dL (ref 8.9–10.3)
Chloride: 105 mmol/L (ref 98–111)
Creatinine, Ser: 1.5 mg/dL — ABNORMAL HIGH (ref 0.61–1.24)
GFR, Estimated: 43 mL/min — ABNORMAL LOW (ref >60)
Glucose, Bld: 128 mg/dL — ABNORMAL HIGH (ref 70–99)
Potassium: 4.6 mmol/L (ref 3.5–5.1)
Sodium: 140 mmol/L (ref 135–145)
Total Bilirubin: 0.8 mg/dL (ref 0.3–1.2)
Total Protein: 5.9 g/dL — ABNORMAL LOW (ref 6.5–8.1)

## 2021-05-24 LAB — D-DIMER, QUANTITATIVE: D-Dimer, Quant: 0.43 ug/mL-FEU (ref 0.00–0.50)

## 2021-05-24 LAB — GLUCOSE, CAPILLARY: Glucose-Capillary: 134 mg/dL — ABNORMAL HIGH (ref 70–99)

## 2021-05-24 LAB — LACTATE DEHYDROGENASE: LDH: 224 U/L — ABNORMAL HIGH (ref 98–192)

## 2021-05-24 LAB — C-REACTIVE PROTEIN: CRP: 1.9 mg/dL — ABNORMAL HIGH (ref <1.0)

## 2021-05-24 LAB — MAGNESIUM: Magnesium: 2.1 mg/dL (ref 1.7–2.4)

## 2021-05-24 NOTE — Progress Notes (Addendum)
Patient has been discharged. Instructions reviewed with patients daughter Dondra Spry. Questions, concerns were denied. Education reviewed, Daughter encouraged to schedule follow up visit as pt needs repeat blood work. Pt stable, ambulatory without assistance. See am assessment for baseline. IV removed lfa, clean dry intact.

## 2021-05-24 NOTE — Discharge Summary (Signed)
Physician Discharge Summary   Daniel Cooke JJO:[id number] DOB: 01-Sep-1924 DOA: 05/20/2021  PCP: Shon Hale, MD  Admit date: 05/20/2021 Discharge date: 05/24/2021   Admitted From: home Disposition:  home Discharging physician: Lewie Chamber, MD  Recommendations for Outpatient Follow-up:  Repeat CMP to follow up LFTs and BUN/creat   Patient discharged to home in Discharge Condition: stable Risk of unplanned readmission score: Unplanned Admission- Pilot do not use: 13.97  CODE STATUS: Full Diet recommendation:  Diet Orders (From admission, onward)     Start     Ordered   05/24/21 0000  Diet - low sodium heart healthy        05/24/21 1102   05/21/21 0024  Diet Heart Room service appropriate? Yes; Fluid consistency: Thin  Diet effective now       Question Answer Comment  Room service appropriate? Yes   Fluid consistency: Thin      05/21/21 0025            Hospital Course: Daniel Cooke is a 87 y.o. male with medical history significant for insulin-dependent diabetes mellitus, hypertension, atrial fibrillation on Eliquis, and chronic kidney disease stage IIIb, presenting to the emergency department with cough, fatigue, and loss of appetite.    Patient's daughter reports that she was found to have COVID-19 infection approximately 1 week ago and that the patient was noted to develop a cough, loss of appetite, and increased fatigue last night (however daughter states he was not exposed directly to her).  Patient has no complaints and was very reluctant to be evaluated in the emergency department but came in at the insistence of his family.  He denies any chest pain, abdominal pain, nausea, vomiting, or diarrhea.  Upon arrival to the ED he was found to be afebrile, with saturations in the low 90s on room air, tachypneic and blood pressure 100/60. Chest x-ray is negative for acute cardiopulmonary disease.  Chemistry panel notable for glucose 278 and creatinine 2.15.   CBC with mild normocytic anemia.  COVID-19 PCR was positive.  Patient was given a normal saline bolus and albuterol in the emergency department.  See below for problem based plan.   * Acute respiratory disease due to COVID-19 virus - Possible recent exposure to daughter positive with COVID-19 approximately 1 week prior to admission (daughter denies direct contact however there may still have been some transmission even through fomites).  Timeline consistent with incubation period and exposure - COVID-19 test positive on admission -Started on remdesivir and steroids due to borderline hypoxia on admission - d/c'd steroids on 6/10 - continue remdesivir to complete 5 day course (stopped 1 day early due to BUN rising and LFTs rising) - repeat CMP at follow up  Acute renal failure superimposed on stage 3b chronic kidney disease (HCC) - SCr is 2.15 on admission, up from 1.5 in 2020 - AKI vs progression to stage IV  - Appears hypovolemic in ED and was given a fluid bolus  - Hold Lasix, continue gentle IVF hydration on admission  -UA also shows hyaline casts consistent with dehydration, treated with IVF - creat 1.5 at discharge but BUN increased to 68 (no mentation changes).  - needs repeat CMP at follow up  Persistent atrial fibrillation (HCC) - CHADS-VASc at least 37 (age x2, CHF, DM)   - Continue Eliquis   Chronic diastolic CHF (congestive heart failure) (HCC) - Appears hypovolemic on admission  - s/p IVF  Type 2 diabetes mellitus with stage 3 chronic kidney  disease, without long-term current use of insulin (HCC) - A1c 8.5% on admission - treated with SSI   Hyperbilirubinemia-resolved as of 05/24/2021 - Total bilirubin 3.1 on admission, up from 2.0 in 2020  - No abdominal pain or GI symptoms, plan to monitor   - TB 0.8 at discharge     The patient's chronic medical conditions were treated accordingly per the patient's home medication regimen except as noted.  On day of discharge,  patient was felt deemed stable for discharge. Patient/family member advised to call PCP or come back to ER if needed.   Principal Diagnosis: Acute respiratory disease due to COVID-19 virus  Discharge Diagnoses: Active Hospital Problems   Diagnosis Date Noted   Acute respiratory disease due to COVID-19 virus 05/20/2021    Priority: High   Acute renal failure superimposed on stage 3b chronic kidney disease (HCC) 11/24/2019    Priority: Medium   Persistent atrial fibrillation (HCC) 11/15/2019   Chronic diastolic CHF (congestive heart failure) (HCC)    Type 2 diabetes mellitus with stage 3 chronic kidney disease, without long-term current use of insulin (HCC) 11/25/2016    Resolved Hospital Problems   Diagnosis Date Noted Date Resolved   Hyperbilirubinemia 05/21/2021 05/24/2021    Discharge Instructions     Diet - low sodium heart healthy   Complete by: As directed    Increase activity slowly   Complete by: As directed       Allergies as of 05/24/2021       Reactions   Codeine Nausea And Vomiting, Other (See Comments)   MAKES ME CRAZY   Oxycodone Nausea And Vomiting, Other (See Comments)   MAKES ME CRAZY   Nsaids    Other reaction(s): Contraindicated d/t CKD   Other Other (See Comments)   All narcotic pain medicine makes him crazy.    Oxybutynin    Other reaction(s): dry mouth   Tramadol Hcl    Other reaction(s): altered mental status        Medication List     STOP taking these medications    acetaminophen 500 MG tablet Commonly known as: TYLENOL       TAKE these medications    amLODipine 2.5 MG tablet Commonly known as: NORVASC Take 2.5 mg by mouth daily.   BD Pen Needle Nano U/F 32G X 4 MM Misc Generic drug: Insulin Pen Needle USE AS DIRECTED ONCE DAILY FOR 30 DAYS   CALCIUM PO Take 1 tablet by mouth daily.   Eliquis 2.5 MG Tabs tablet Generic drug: apixaban Take 1 tablet by mouth twice daily What changed: how much to take   finasteride 5 MG  tablet Commonly known as: PROSCAR Take 5 mg by mouth daily.   FreeStyle Libre 14 Day Reader Hardie Pulley See admin instructions.   furosemide 40 MG tablet Commonly known as: LASIX Take 1/2 (one-half) tablet by mouth once daily What changed: See the new instructions.   HumaLOG KwikPen 100 UNIT/ML KwikPen Generic drug: insulin lispro Inject 2 Units into the skin daily with supper.   Lantus SoloStar 100 UNIT/ML Solostar Pen Generic drug: insulin glargine Inject 4 Units into the skin at bedtime. What changed: how much to take   Myrbetriq 50 MG Tb24 tablet Generic drug: mirabegron ER Take 50 mg by mouth daily.   simvastatin 20 MG tablet Commonly known as: ZOCOR Take 20 mg by mouth at bedtime.   traZODone 50 MG tablet Commonly known as: DESYREL Take 50 mg by mouth at bedtime.  VITAMIN D3 PO Take 1 tablet by mouth daily.        Follow-up Information     Shon Haleimberlake, Kathryn S, MD. Schedule an appointment as soon as possible for a visit in 1 week(s).   Specialty: Family Medicine Why: Needs repeat CMP. Mild increase in LFTs. Given 4 days remdesivir. Contact information: [address] [place] [address] [phone]         Regan Lemmingamnitz, Will Martin, MD .   Specialty: Cardiology Contact information: [address] [place] [address] [phone]         Regan Lemmingamnitz, Will Martin, MD .   Specialty: Cardiology Contact information: [address] [place] [address] [phone]                Allergies  Allergen Reactions   Codeine Nausea And Vomiting and Other (See Comments)    MAKES ME CRAZY   Oxycodone Nausea And Vomiting and Other (See Comments)    MAKES ME CRAZY   Nsaids     Other reaction(s): Contraindicated d/t CKD   Other Other (See Comments)    All narcotic pain medicine makes him crazy.    Oxybutynin     Other reaction(s): dry mouth   Tramadol Hcl     Other reaction(s): altered mental status     Consultations:   Discharge Exam: BP 140/65 (BP Location: Right Arm)   Pulse 80   Temp 97.6 F (36.4 C) (Oral)   Resp 18   Ht 5\' 8"  (1.727 m)   Wt 81.6 kg   SpO2 98%   BMI 27.37 kg/m  General appearance: alert, cooperative, NAD Head: Normocephalic, without obvious abnormality, atraumatic Eyes:  EOMI Lungs:  Mild rhonchi appreciated right upper lobe.  No wheezing Heart: regular rate and rhythm, S1, S2 normal and 3/6 HSM best heard RUSB Abdomen: normal findings: bowel sounds normal and soft, non-tender Extremities:  No edema Skin: mobility and turgor normal Neurologic: Grossly normal  The results of significant diagnostics from this hospitalization (including imaging, microbiology, ancillary and laboratory) are listed below for reference.   Microbiology: Recent Results (from the past 240 hour(s))  Resp Panel by RT-PCR (Flu A&B, Covid) Nasopharyngeal Swab     Status: Abnormal   Collection Time: 05/20/21  9:05 PM   Specimen: Nasopharyngeal Swab; Nasopharyngeal(NP) swabs in vial transport medium  Result Value Ref Range Status   SARS Coronavirus 2 by RT PCR POSITIVE (A) NEGATIVE Final    Comment: RESULT CALLED TO, READ BACK BY AND VERIFIED WITH: HARRIS,K RN @2309  ON 05/20/21 JACKSON,K (NOTE) SARS-CoV-2 target nucleic acids are DETECTED.  The SARS-CoV-2 RNA is generally detectable in upper respiratory specimens during the acute phase of infection. Positive results are indicative of the presence of the identified virus, but do not rule out bacterial infection or co-infection with other pathogens not detected by the test. Clinical correlation with patient history and other diagnostic information is necessary to determine patient infection status. The expected result is Negative.  Fact Sheet for Patients: BloggerCourse.comhttps://www.fda.gov/media/152166/download  Fact Sheet for Healthcare Providers: SeriousBroker.ithttps://www.fda.gov/media/152162/download  This test is not yet approved or cleared by the  [place] FDA and  has been authorized for detection and/or diagnosis of SARS-CoV-2 by FDA under an Emergency Use Authorization (EUA).  This EUA will remain in effect (meaning this test ca n be used) for the duration of  the COVID-19 declaration under Section 564(b)(1) of the Act, 21 U.S.C. section 360bbb-3(b)(1), unless the authorization is terminated  or revoked sooner.     Influenza A by PCR NEGATIVE NEGATIVE Final   Influenza B by PCR NEGATIVE NEGATIVE Final    Comment: (NOTE) The Xpert Xpress SARS-CoV-2/FLU/RSV plus assay is intended as an aid in the diagnosis of influenza from Nasopharyngeal swab specimens and should not be used as a sole basis for treatment. Nasal washings and aspirates are unacceptable for Xpert Xpress SARS-CoV-2/FLU/RSV testing.  Fact Sheet for Patients: BloggerCourse.com  Fact Sheet for Healthcare Providers: SeriousBroker.it  This test is not yet approved or cleared by the [place] FDA and has been authorized for detection and/or diagnosis of SARS-CoV-2 by FDA under an Emergency Use Authorization (EUA). This EUA will remain in effect (meaning this test can be used) for the duration of the COVID-19 declaration under Section 564(b)(1) of the Act, 21 U.S.C. section 360bbb-3(b)(1), unless the authorization is terminated or revoked.  Performed at [facility], [address] [place], [place] [address]      Labs: BNP (last 3 results) No results for input(s): BNP in the last 8760 hours. Basic Metabolic Panel: Recent Labs  Lab 05/20/21 2140 05/21/21 1000 05/22/21 0420 05/23/21 0419 05/24/21 0441  NA 138 137 137 139 140  K 4.3 4.3 4.3 4.4 4.6  CL 102 104 105 107 105  CO2 26 24 24 25 26   GLUCOSE 278* 301* 261* 112* 128*  BUN 48* 46* 53* 61* 68*  CREATININE 2.15* 1.82* 1.93* 1.52* 1.50*  CALCIUM 10.4* 9.7 9.8 9.4 9.7  MG  --  2.0 2.0 2.1 2.1   Liver Function  Tests: Recent Labs  Lab 05/20/21 2140 05/21/21 1000 05/23/21 0419 05/24/21 0441  AST 24 28 115* 97*  ALT 22 21 44 65*  ALKPHOS 59 57 46 47  BILITOT 3.1* 1.9* 1.0 0.8  PROT 6.6 6.5 5.7* 5.9*  ALBUMIN 3.9 3.7 3.4* 3.5   No results for input(s): LIPASE, AMYLASE in the last 168 hours. No results for input(s): AMMONIA in the last 168 hours. CBC: Recent Labs  Lab 05/20/21 2140 05/21/21 1000 05/22/21 0420 05/23/21 0419 05/24/21 0441  WBC 6.4 6.6 9.1 13.5* 12.1*  NEUTROABS 4.8 5.7 7.5 11.7* 10.1*  HGB 11.2* 12.2* 11.8* 11.6* 11.3*  HCT 33.4* 36.9* 36.1* 34.8* 34.1*  MCV 96.5 98.4 98.4 97.2 97.7  PLT 152 146* 156 163 163   Cardiac Enzymes: No results for input(s): CKTOTAL, CKMB, CKMBINDEX, TROPONINI in the last 168 hours. BNP: Invalid input(s): POCBNP CBG: Recent Labs  Lab 05/23/21 0838 05/23/21 1149 05/23/21 1607 05/23/21 2110 05/24/21 0839  GLUCAP 163* 260* 259* 221* 134*   D-Dimer Recent Labs    05/23/21 0419 05/24/21 0441  DDIMER 0.45 0.43   Hgb A1c No results for input(s): HGBA1C in the last 72 hours. Lipid Profile No results for input(s): CHOL, HDL, LDLCALC, TRIG, CHOLHDL, LDLDIRECT in the last 72 hours. Thyroid function studies No results for input(s): TSH, T4TOTAL, T3FREE, THYROIDAB in the last 72 hours.  Invalid input(s): FREET3 Anemia work up No results for input(s): VITAMINB12, FOLATE, FERRITIN, TIBC, IRON, RETICCTPCT in the last 72 hours. Urinalysis    Component Value Date/Time   COLORURINE YELLOW 05/21/2021 1612   APPEARANCEUR CLEAR 05/21/2021 1612   LABSPEC 1.015 05/21/2021 1612   PHURINE 5.0 05/21/2021 1612   GLUCOSEU >=500 (A) 05/21/2021 1612   HGBUR NEGATIVE 05/21/2021 1612   BILIRUBINUR NEGATIVE 05/21/2021 1612   KETONESUR NEGATIVE 05/21/2021 1612   PROTEINUR 30 (A) 05/21/2021 1612   UROBILINOGEN 0.2 01/28/2012 1143  NITRITE NEGATIVE 05/21/2021 1612   LEUKOCYTESUR NEGATIVE 05/21/2021 1612   Sepsis Labs Invalid input(s):  PROCALCITONIN,  WBC,  LACTICIDVEN Microbiology Recent Results (from the past 240 hour(s))  Resp Panel by RT-PCR (Flu A&B, Covid) Nasopharyngeal Swab     Status: Abnormal   Collection Time: 05/20/21  9:05 PM   Specimen: Nasopharyngeal Swab; Nasopharyngeal(NP) swabs in vial transport medium  Result Value Ref Range Status   SARS Coronavirus 2 by RT PCR POSITIVE (A) NEGATIVE Final    Comment: RESULT CALLED TO, READ BACK BY AND VERIFIED WITH: HARRIS,K RN @2309  ON 05/20/21 JACKSON,K (NOTE) SARS-CoV-2 target nucleic acids are DETECTED.  The SARS-CoV-2 RNA is generally detectable in upper respiratory specimens during the acute phase of infection. Positive results are indicative of the presence of the identified virus, but do not rule out bacterial infection or co-infection with other pathogens not detected by the test. Clinical correlation with patient history and other diagnostic information is necessary to determine patient infection status. The expected result is Negative.  Fact Sheet for Patients: 07/20/21  Fact Sheet for Healthcare Providers: BloggerCourse.com  This test is not yet approved or cleared by the [place] FDA and  has been authorized for detection and/or diagnosis of SARS-CoV-2 by FDA under an Emergency Use Authorization (EUA).  This EUA will remain in effect (meaning this test ca n be used) for the duration of  the COVID-19 declaration under Section 564(b)(1) of the Act, 21 U.S.C. section 360bbb-3(b)(1), unless the authorization is terminated or revoked sooner.     Influenza A by PCR NEGATIVE NEGATIVE Final   Influenza B by PCR NEGATIVE NEGATIVE Final    Comment: (NOTE) The Xpert Xpress SARS-CoV-2/FLU/RSV plus assay is intended as an aid in the diagnosis of influenza from Nasopharyngeal swab specimens and should not be used as a sole basis for treatment. Nasal washings and aspirates are unacceptable for  Xpert Xpress SARS-CoV-2/FLU/RSV testing.  Fact Sheet for Patients: [place]  Fact Sheet for Healthcare Providers: BloggerCourse.com  This test is not yet approved or cleared by the SeriousBroker.[place] FDA and has been authorized for detection and/or diagnosis of SARS-CoV-2 by FDA under an Emergency Use Authorization (EUA). This EUA will remain in effect (meaning this test can be used) for the duration of the COVID-19 declaration under Section 564(b)(1) of the Act, 21 U.S.C. section 360bbb-3(b)(1), unless the authorization is terminated or revoked.  Performed at [facility], [address], [place], [place]     Procedures/Studies: DG Chest Port 1 View  Result Date: 05/20/2021 CLINICAL DATA:  Cough and weakness. EXAM: PORTABLE CHEST 1 VIEW COMPARISON:  November 24, 2019 FINDINGS: Mild, stable areas of linear scarring are seen within the mid right lung and right lung base. There is no evidence of acute infiltrate, pleural effusion or pneumothorax. The heart size and mediastinal contours are within normal limits. Mild calcification of the aortic arch is seen. Radiopaque surgical clips are seen within the right upper quadrant. The visualized skeletal structures are unremarkable. IMPRESSION: Stable exam without acute cardiopulmonary disease. Electronically Signed   By: November 26, 2019 M.D.   On: 05/20/2021 22:40     Time coordinating discharge: Over 30 minutes    07/20/2021, MD  [facility] 05/24/2021, 2:55 PM

## 2021-05-24 NOTE — Plan of Care (Signed)

## 2021-05-24 NOTE — Discharge Instructions (Signed)
Ask for repeat labwork at follow up visit to check kidney and liver function.

## 2021-05-28 DIAGNOSIS — E1165 Type 2 diabetes mellitus with hyperglycemia: Secondary | ICD-10-CM | POA: Diagnosis not present

## 2021-05-28 DIAGNOSIS — R059 Cough, unspecified: Secondary | ICD-10-CM | POA: Diagnosis not present

## 2021-05-28 DIAGNOSIS — U071 COVID-19: Secondary | ICD-10-CM | POA: Diagnosis not present

## 2021-05-28 DIAGNOSIS — I10 Essential (primary) hypertension: Secondary | ICD-10-CM | POA: Diagnosis not present

## 2021-05-30 DIAGNOSIS — N1832 Chronic kidney disease, stage 3b: Secondary | ICD-10-CM | POA: Diagnosis not present

## 2021-06-13 DIAGNOSIS — 419620001 Death: Secondary | SNOMED CT | POA: Diagnosis not present

## 2021-06-13 DEATH — deceased

## 2021-06-18 ENCOUNTER — Telehealth: Payer: Self-pay | Admitting: *Deleted

## 2021-06-18 NOTE — Telephone Encounter (Signed)
   [facility] Pre-operative Risk Assessment    Patient Name: Daniel Cooke  DOB: 07-14-1924  MRN: [id number]   HEARTCARE STAFF: - Please ensure there is not already an duplicate clearance open for this procedure. - Under Visit Info/Reason for Call, type in Other and utilize the format Clearance MM/DD/YY or Clearance TBD. Do not use dashes or single digits. - If request is for dental extraction, please clarify the # of teeth to be extracted. - If the patient is currently at the dentist's office, call Pre-Op APP to address. If the patient is not currently in the dentist office, please route to the Pre-Op pool  Request for surgical clearance:  What type of surgery is being performed? B/L RFA L4-L5-S1   When is this surgery scheduled? 07/14/21   What type of clearance is required (medical clearance vs. Pharmacy clearance to hold med vs. Both)? MEDICAL  Are there any medications that need to be held prior to surgery and how long? MEDICAL, PER DR. DAVE EICHMAN NO NEED TO HOLD BLOOD THINNER   Practice name and name of physician performing surgery? [facility]; DR. Lenord Carbo  What is the office phone number? [phone]   7.   What is the office fax number? [phone]   8.   Anesthesia type (None, local, MAC, general) ? NOT LISTED   Daniel Cooke 06/18/2021, 5:15 PM  _________________________________________________________________   (provider comments below)

## 2021-06-19 NOTE — Telephone Encounter (Signed)
Will send a message to EP Scheduler [place] to reach out to the pt with an appt for pre op.

## 2021-06-19 NOTE — Telephone Encounter (Signed)
   Name: DAYMION NAZAIRE  DOB: Oct 26, 1924  MRN: [id number]  Primary Cardiologist: Regan Lemming, MD  Chart reviewed as part of pre-operative protocol coverage. Because of Hurshel Bouillon [place]'s past medical history and time since last visit, he will require a follow-up visit in order to better assess preoperative cardiovascular risk.  Last OV was in 08/2020 with Dr. Elberta Fortis. He was recently admitted in June 2022 with acute respiratory disease due to Covid - during admission he had borderline O2 sats, hypotension, acute renal failure requiring adjustment of his diuretic regimen and IVF fluids. He also had abnormal LFTs and anemia during that visit. Did not have EKG during that admission. Given advanced age and recent clinical course, would suggest OV prior to clearing.  Pre-op covering staff: - Please schedule appointment and call patient to inform them. If patient already had an upcoming appointment within acceptable timeframe, please add "pre-op clearance" to the appointment notes so provider is aware. - Please contact requesting surgeon's office via preferred method (i.e, phone, fax) to inform them of need for appointment prior to surgery.  Will hold off on routing to pharmacy team as clearance intake states no need to hold blood thinner.  Laurann Montana, PA-C  06/19/2021, 8:54 AM

## 2021-06-20 NOTE — Telephone Encounter (Signed)
Pt daughter would like for Dr. Elberta Fortis RN to call and speak with her.

## 2021-06-20 NOTE — Telephone Encounter (Signed)
Called and spoke w/ dtr. Aware will further discuss with Dr. Elberta Fortis on Monday and let her know. She is appreciative

## 2021-06-25 NOTE — Telephone Encounter (Signed)
Per Dr. Elberta Fortis: Pt does not need OV prior to injection/s. Pt may proceed without preop appt needed.

## 2021-06-25 NOTE — Telephone Encounter (Signed)
Clearance notes have been faxed to the surgeon's office

## 2021-07-01 ENCOUNTER — Telehealth: Payer: Self-pay | Admitting: Cardiology

## 2021-07-01 DIAGNOSIS — I10 Essential (primary) hypertension: Secondary | ICD-10-CM | POA: Diagnosis not present

## 2021-07-01 DIAGNOSIS — M5416 Radiculopathy, lumbar region: Secondary | ICD-10-CM | POA: Diagnosis not present

## 2021-07-01 DIAGNOSIS — M47816 Spondylosis without myelopathy or radiculopathy, lumbar region: Secondary | ICD-10-CM | POA: Diagnosis not present

## 2021-07-01 DIAGNOSIS — G894 Chronic pain syndrome: Secondary | ICD-10-CM | POA: Diagnosis not present

## 2021-07-01 NOTE — Telephone Encounter (Signed)
Spoke to dtr Reports they have decided to try something different w/ pt and not doing the injection anymore.  The MD would like to try electrical stimulators to pts back region. Pt will need clearance for this and MD office is sending pre op clearance request. Pt scheduled to see Dr. Elberta Fortis 8/2 for clearance and routine follow up Dtr appreciates our help w/ this matter.

## 2021-07-01 NOTE — Telephone Encounter (Signed)
Patient requesting a call back from [place] to discuss the patient's upcoming procedure.

## 2021-07-15 ENCOUNTER — Other Ambulatory Visit: Payer: Self-pay

## 2021-07-15 ENCOUNTER — Encounter: Payer: Self-pay | Admitting: Cardiology

## 2021-07-15 ENCOUNTER — Ambulatory Visit: Payer: Medicare Other | Admitting: Cardiology

## 2021-07-15 VITALS — BP 116/58 | HR 88 | Ht 70.0 in | Wt 179.0 lb

## 2021-07-15 DIAGNOSIS — I4819 Other persistent atrial fibrillation: Secondary | ICD-10-CM

## 2021-07-15 NOTE — Progress Notes (Signed)
Clearance note has been put in epic as of yesterday 07/09/21 and hs been sent to the pre op pool.   07/15/21 OFFICE NOTE HAS BEEN FAXED TO SURGEON'S OFFICE TODAY 07/15/21 @ 10:40 AM ..CMF

## 2021-07-15 NOTE — Progress Notes (Signed)
Electrophysiology Office Note   Date:  07/15/2021   ID:  Bishoy, Cupp [birth year], MRN [id number]  PCP:  Shon Hale, MD  Electrophysiologist:  Regan Lemming, MD    No chief complaint on file.    History of Present Illness: Daniel Cooke is a 87 y.o. male who presents today for electrophysiology evaluation.     He has a history significant hypertension, diabetes, hyperlipidemia.  Sending in atrial fibrillation after a fall where he suffered a vertebral fracture.  He was found to have a pinched nerve around his vertebra and had plan for injections.  He was admitted with heart failure exacerbation with worsening dyspnea on exertion and edema.  His weight had increased by 20 pounds.  BNP was elevated at 1068.  It was thought that atrial fibrillation was contributing to his heart failure episode.  He is not cardioverted and was planned to have a second steroid injection in his back.  He now has plans for a spinal nerve stimulator.  Today, denies symptoms of palpitations, chest pain, shortness of breath, orthopnea, PND, lower extremity edema, claudication, dizziness, presyncope, syncope, bleeding, or neurologic sequela. The patient is tolerating medications without difficulties.    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  Past Medical History:  Diagnosis Date   Arthritis    "was in left knee before replacement; now have it in my right knee" (11/25/2016)   BPH (benign prostatic hypertrophy)    Chronic diastolic CHF (congestive heart failure) (HCC)    a. 02/2016 Echo: EF 60-65%, no rwma, triv AI, mild MR, mildly to mod dil LA, mod dil RA, PASP .   Dysrhythmia    Essential hypertension    Hard of hearing    wears bilateral hearing aids   Hypercholesterolemia    Osteoarthritis    PAF (paroxysmal atrial fibrillation) (HCC)    a. CHADS2VASC score of 4 --> coumadin.   Pilonidal cyst    PAST HX - NO PROBLEM  NOW   Pneumonia    "one time; years ago" (11/25/2016)   Shingles    "long time ago"   Synovitis of knee 02/27/2014   Type II diabetes mellitus (HCC)    oral meds - no insulin   Past Surgical History:  Procedure Laterality Date   APPENDECTOMY     CARDIOVERSION N/A 02/04/2017   Procedure: CARDIOVERSION;  Surgeon: [name], MD;  Location: [facility] ENDOSCOPY;  Service: Cardiovascular;  Laterality: N/A;   CATARACT EXTRACTION W/ INTRAOCULAR LENS IMPLANT Left 10/2016   CHOLECYSTECTOMY OPEN     COLONOSCOPY W/ BIOPSIES  08/2005   [name] 04/27/2011   EYE SURGERY Right    "right" traumatic cataract removed--injury to the eye--states his eyesight is ok in left eye   JOINT REPLACEMENT     KNEE ARTHROSCOPY Left 02/28/2014   Procedure: ARTHROSCOPY LEFT KNEE WITH SYNOVECTOMY;  Surgeon: [name], MD;  Location: [facility] ORS;  Service: Orthopedics;  Laterality: Left;   KYPHOPLASTY  2017   KYPHOPLASTY N/A 05/28/2017   Procedure: Lumbar Four Kyphoplasty;  Surgeon: Maeola Harman, MD;  Location: [facility] OR;  Service: Neurosurgery;  Laterality: N/A;  L4 Kyphoplasty   SHOULDER OPEN ROTATOR CUFF REPAIR Left    TONSILLECTOMY     TOTAL KNEE ARTHROPLASTY  02/08/2012   Procedure: TOTAL KNEE ARTHROPLASTY;  Surgeon: [name], MD;  Location: [facility] ORS;  Service: Orthopedics;  Laterality: Left;  VASECTOMY       Current Outpatient Medications  Medication Sig Dispense Refill   amLODipine (NORVASC) 2.5 MG tablet Take 2.5 mg by mouth daily.     CALCIUM PO Take 1 tablet by mouth daily.     Cholecalciferol (VITAMIN D3 PO) Take 1 tablet by mouth daily.     Continuous Blood Gluc Receiver (FREESTYLE LIBRE 14 DAY READER) DEVI See admin instructions.     ELIQUIS 2.5 MG TABS tablet Take 1 tablet by mouth twice daily 180 tablet 1   finasteride (PROSCAR) 5 MG tablet Take 5 mg by mouth daily.     furosemide (LASIX) 40 MG tablet Take 1/2 (one-half) tablet by mouth once daily 45 tablet 2   HUMALOG KWIKPEN 100 UNIT/ML KwikPen  Inject 2 Units into the skin daily with supper.      Insulin Glargine (LANTUS SOLOSTAR) 100 UNIT/ML Solostar Pen Inject 4 Units into the skin at bedtime. 15 mL 0   Insulin Pen Needle (BD PEN NEEDLE NANO U/F) 32G X 4 MM MISC USE AS DIRECTED ONCE DAILY FOR 30 DAYS     MYRBETRIQ 50 MG TB24 tablet Take 50 mg by mouth daily.     simvastatin (ZOCOR) 20 MG tablet Take 20 mg by mouth at bedtime.     traZODone (DESYREL) 50 MG tablet Take 50 mg by mouth at bedtime.     No current facility-administered medications for this visit.    Allergies:   Codeine, Oxycodone, Nsaids, Other, Oxybutynin, and Tramadol hcl   Social History:  The patient  reports that he has never smoked. He has never used smokeless tobacco. He reports that he does not drink alcohol and does not use drugs.   Family History:  The patient's family history includes Heart attack in his brother; Heart disease in his brother and mother.   ROS:  Please see the history of present illness.   Otherwise, review of systems is positive for none.   All other systems are reviewed and negative.   PHYSICAL EXAM: VS:  BP (!) 116/58   Pulse 88   Ht 5\' 10"  (1.778 m)   Wt 179 lb (81.2 kg)   BMI 25.68 kg/m  , BMI Body mass index is 25.68 kg/m. GEN: Well nourished, well developed, in no acute distress  HEENT: normal  Neck: no JVD, carotid bruits, or masses Cardiac: RRR; no murmurs, rubs, or gallops,no edema  Respiratory:  clear to auscultation bilaterally, normal work of breathing GI: soft, nontender, nondistended, + BS MS: no deformity or atrophy  Skin: warm and dry Neuro:  Strength and sensation are intact Psych: euthymic mood, full affect  EKG:  EKG is ordered today. Personal review of the ekg ordered  shows atrial fibrillation  Recent Labs: 05/24/2021: ALT 65; BUN 68; Creatinine, Ser 1.50; Hemoglobin 11.3; Magnesium 2.1; Platelets 163; Potassium 4.6; Sodium 140    Lipid Panel  No results found for: CHOL, TRIG, HDL, CHOLHDL, VLDL,  LDLCALC, LDLDIRECT   Wt Readings from Last 3 Encounters:  07/15/21 179 lb (81.2 kg)  05/20/21 180 lb (81.6 kg)  08/15/20 179 lb (81.2 kg)      Other studies Reviewed: Additional studies/ records that were reviewed today include: 12/2010 MPI  Review of the above records today demonstrates:  ECG positive for ischemia, myoview with normal perfusion  11/26/16 TTE - Left ventricle: The cavity size was normal. Wall thickness was   increased in a pattern of moderate LVH. Systolic function was   normal. The estimated  ejection fraction was in the range of 60%   to 65%. Wall motion was normal; there were no regional wall   motion abnormalities. - Mitral valve: There was moderate regurgitation. - Left atrium: The atrium was moderately dilated. - Right ventricle: The cavity size was mildly dilated. Wall   thickness was normal. - Right atrium: The atrium was severely dilated. - Tricuspid valve: There was moderate regurgitation. - Pulmonary arteries: Systolic pressure was moderately increased.   PA peak pressure: 65 mm Hg (S).  ASSESSMENT AND PLAN:  1.  Persistent atrial fibrillation: Currently on Eliquis with a CHA2DS2-VASc of 3.  Currently has a rate control strategy.  He feels well.  No changes at this time.  2.  Diastolic heart failure: No obvious volume overload.  3.  Hyperlipidemia: Continue statin  4.  Preoperative evaluation: Has plans for spinal nerve stimulator.  He is of advanced age, though he has good heart health.  He is mainly limited by orthopedic issues.  I think that he would be at intermediate risk for this low to intermediate risk procedure.  If he needs to hold his anticoagulation for 2 to 3 days prior to the procedure, that would be acceptable.  Current medicines are reviewed at length with the patient today.   The patient does not have concerns regarding his medicines.  The following changes were made today: None  Labs/ tests ordered today include:  Orders Placed  This Encounter  Procedures   EKG 12-Lead      Disposition:   FU with Jamen Loiseau 12 months  Signed, Emylia Latella Jorja Loa, MD  07/15/2021 9:53 AM     [facility] [address] [place] [address] [phone] (office) [phone] (fax)

## 2021-07-17 ENCOUNTER — Other Ambulatory Visit: Payer: Self-pay | Admitting: Cardiology

## 2021-07-17 DIAGNOSIS — I4819 Other persistent atrial fibrillation: Secondary | ICD-10-CM

## 2021-07-17 NOTE — Telephone Encounter (Signed)
Pt last saw Dr Elberta Fortis 08/15/20, last labs 05/24/21 Creat 1.50, 05/23/21 Creat 1.52, 05/22/21 Creat 1.93, 05/20/21 Creat 2.15, age 87, weight 81.2kg, based on specified criteria pt is on appropriate dosage of Eliquis 2.5mg  BID.  Will refill rx.

## 2021-07-30 DIAGNOSIS — G894 Chronic pain syndrome: Secondary | ICD-10-CM | POA: Diagnosis not present

## 2021-07-30 DIAGNOSIS — M47816 Spondylosis without myelopathy or radiculopathy, lumbar region: Secondary | ICD-10-CM | POA: Diagnosis not present

## 2021-09-05 ENCOUNTER — Telehealth: Payer: Self-pay | Admitting: Cardiology

## 2021-09-05 NOTE — Telephone Encounter (Signed)
STAT if HR is under 50 or over 120 (normal HR is 60-100 beats per minute)  What is your heart rate? 57 since 2pm  Do you have a log of your heart rate readings (document readings)? 46 this morning,   Do you have any other symptoms? No other symptoms  No pain No SOB No Chest Pain, [name] states pt is fine now

## 2021-09-05 NOTE — Telephone Encounter (Signed)
Followed up with dtr. Pt doing fine without symptoms. HRs in 40s this morning, avg high 50s...Marland KitchenMarland Kitchen Aware may be r/t to AFib and will keep a watch. Will call if symptoms arise. Dtr appreciates call and comfortable with plan as this was more an [place]

## 2021-09-11 DIAGNOSIS — E1165 Type 2 diabetes mellitus with hyperglycemia: Secondary | ICD-10-CM | POA: Diagnosis not present

## 2021-09-11 DIAGNOSIS — I10 Essential (primary) hypertension: Secondary | ICD-10-CM | POA: Diagnosis not present

## 2021-09-11 DIAGNOSIS — N183 Chronic kidney disease, stage 3 unspecified: Secondary | ICD-10-CM | POA: Diagnosis not present

## 2021-09-11 DIAGNOSIS — I4891 Unspecified atrial fibrillation: Secondary | ICD-10-CM | POA: Diagnosis not present

## 2021-09-15 DIAGNOSIS — Z23 Encounter for immunization: Secondary | ICD-10-CM | POA: Diagnosis not present

## 2021-09-15 DIAGNOSIS — Z0000 Encounter for general adult medical examination without abnormal findings: Secondary | ICD-10-CM | POA: Diagnosis not present

## 2021-09-15 DIAGNOSIS — E7800 Pure hypercholesterolemia, unspecified: Secondary | ICD-10-CM | POA: Diagnosis not present

## 2021-09-15 DIAGNOSIS — E1165 Type 2 diabetes mellitus with hyperglycemia: Secondary | ICD-10-CM | POA: Diagnosis not present

## 2021-09-19 DIAGNOSIS — N184 Chronic kidney disease, stage 4 (severe): Secondary | ICD-10-CM | POA: Diagnosis not present

## 2021-09-19 DIAGNOSIS — N529 Male erectile dysfunction, unspecified: Secondary | ICD-10-CM | POA: Diagnosis not present

## 2021-09-19 DIAGNOSIS — R001 Bradycardia, unspecified: Secondary | ICD-10-CM | POA: Diagnosis not present

## 2021-09-19 DIAGNOSIS — E7800 Pure hypercholesterolemia, unspecified: Secondary | ICD-10-CM | POA: Diagnosis not present

## 2021-09-19 DIAGNOSIS — L309 Dermatitis, unspecified: Secondary | ICD-10-CM | POA: Diagnosis not present

## 2021-09-19 DIAGNOSIS — F3289 Other specified depressive episodes: Secondary | ICD-10-CM | POA: Diagnosis not present

## 2021-09-19 DIAGNOSIS — I4891 Unspecified atrial fibrillation: Secondary | ICD-10-CM | POA: Diagnosis not present

## 2021-09-19 DIAGNOSIS — I130 Hypertensive heart and chronic kidney disease with heart failure and stage 1 through stage 4 chronic kidney disease, or unspecified chronic kidney disease: Secondary | ICD-10-CM | POA: Diagnosis not present

## 2021-09-19 DIAGNOSIS — M549 Dorsalgia, unspecified: Secondary | ICD-10-CM | POA: Diagnosis not present

## 2021-09-19 DIAGNOSIS — I5022 Chronic systolic (congestive) heart failure: Secondary | ICD-10-CM | POA: Diagnosis not present

## 2021-09-19 DIAGNOSIS — N400 Enlarged prostate without lower urinary tract symptoms: Secondary | ICD-10-CM | POA: Diagnosis not present

## 2021-09-19 DIAGNOSIS — H35033 Hypertensive retinopathy, bilateral: Secondary | ICD-10-CM | POA: Diagnosis not present

## 2021-09-19 DIAGNOSIS — E1165 Type 2 diabetes mellitus with hyperglycemia: Secondary | ICD-10-CM | POA: Diagnosis not present

## 2021-09-19 DIAGNOSIS — E1122 Type 2 diabetes mellitus with diabetic chronic kidney disease: Secondary | ICD-10-CM | POA: Diagnosis not present

## 2021-09-19 DIAGNOSIS — R296 Repeated falls: Secondary | ICD-10-CM | POA: Diagnosis not present

## 2021-09-19 DIAGNOSIS — M1711 Unilateral primary osteoarthritis, right knee: Secondary | ICD-10-CM | POA: Diagnosis not present

## 2021-09-23 DIAGNOSIS — I10 Essential (primary) hypertension: Secondary | ICD-10-CM | POA: Diagnosis not present

## 2021-09-23 DIAGNOSIS — Z6826 Body mass index (BMI) 26.0-26.9, adult: Secondary | ICD-10-CM | POA: Diagnosis not present

## 2021-09-23 DIAGNOSIS — G894 Chronic pain syndrome: Secondary | ICD-10-CM | POA: Diagnosis not present

## 2021-10-01 ENCOUNTER — Encounter (INDEPENDENT_AMBULATORY_CARE_PROVIDER_SITE_OTHER): Payer: Medicare Other | Admitting: Ophthalmology

## 2021-10-01 ENCOUNTER — Other Ambulatory Visit: Payer: Self-pay

## 2021-10-01 DIAGNOSIS — H35033 Hypertensive retinopathy, bilateral: Secondary | ICD-10-CM | POA: Diagnosis not present

## 2021-10-01 DIAGNOSIS — H4423 Degenerative myopia, bilateral: Secondary | ICD-10-CM

## 2021-10-01 DIAGNOSIS — H43812 Vitreous degeneration, left eye: Secondary | ICD-10-CM | POA: Diagnosis not present

## 2021-10-01 DIAGNOSIS — H338 Other retinal detachments: Secondary | ICD-10-CM

## 2021-10-01 DIAGNOSIS — I10 Essential (primary) hypertension: Secondary | ICD-10-CM | POA: Diagnosis not present

## 2021-10-07 DIAGNOSIS — M5416 Radiculopathy, lumbar region: Secondary | ICD-10-CM | POA: Diagnosis not present

## 2021-10-07 DIAGNOSIS — G894 Chronic pain syndrome: Secondary | ICD-10-CM | POA: Diagnosis not present

## 2021-10-07 DIAGNOSIS — M47816 Spondylosis without myelopathy or radiculopathy, lumbar region: Secondary | ICD-10-CM | POA: Diagnosis not present

## 2021-10-07 DIAGNOSIS — Z6826 Body mass index (BMI) 26.0-26.9, adult: Secondary | ICD-10-CM | POA: Diagnosis not present

## 2021-10-31 DIAGNOSIS — G894 Chronic pain syndrome: Secondary | ICD-10-CM | POA: Diagnosis not present

## 2021-10-31 DIAGNOSIS — M1711 Unilateral primary osteoarthritis, right knee: Secondary | ICD-10-CM | POA: Diagnosis not present

## 2021-11-12 ENCOUNTER — Other Ambulatory Visit: Payer: Self-pay | Admitting: Cardiology

## 2021-12-16 DIAGNOSIS — G894 Chronic pain syndrome: Secondary | ICD-10-CM | POA: Diagnosis not present

## 2021-12-16 DIAGNOSIS — M1711 Unilateral primary osteoarthritis, right knee: Secondary | ICD-10-CM | POA: Diagnosis not present

## 2022-01-12 ENCOUNTER — Other Ambulatory Visit: Payer: Self-pay | Admitting: Cardiology

## 2022-01-12 DIAGNOSIS — I4819 Other persistent atrial fibrillation: Secondary | ICD-10-CM

## 2022-01-13 DIAGNOSIS — H903 Sensorineural hearing loss, bilateral: Secondary | ICD-10-CM | POA: Diagnosis not present

## 2022-01-13 DIAGNOSIS — H60331 Swimmer's ear, right ear: Secondary | ICD-10-CM | POA: Diagnosis not present

## 2022-01-13 NOTE — Telephone Encounter (Signed)
Pt last saw Dr Elberta Fortis 07/15/21, last labs 05/24/21 Creat 1.50, age 88, weight 81.2kg, based on specified criteria pt is on appropriate dosage of Eliquis 2.5mg  BID for afib.  Will refill rx.

## 2022-01-27 DIAGNOSIS — H903 Sensorineural hearing loss, bilateral: Secondary | ICD-10-CM | POA: Diagnosis not present

## 2022-01-27 DIAGNOSIS — H60331 Swimmer's ear, right ear: Secondary | ICD-10-CM | POA: Diagnosis not present

## 2022-02-05 DIAGNOSIS — R7309 Other abnormal glucose: Secondary | ICD-10-CM | POA: Diagnosis not present

## 2022-02-09 DIAGNOSIS — Z961 Presence of intraocular lens: Secondary | ICD-10-CM | POA: Diagnosis not present

## 2022-03-16 DIAGNOSIS — I4891 Unspecified atrial fibrillation: Secondary | ICD-10-CM | POA: Diagnosis not present

## 2022-03-16 DIAGNOSIS — E1165 Type 2 diabetes mellitus with hyperglycemia: Secondary | ICD-10-CM | POA: Diagnosis not present

## 2022-03-16 DIAGNOSIS — I10 Essential (primary) hypertension: Secondary | ICD-10-CM | POA: Diagnosis not present

## 2022-03-16 DIAGNOSIS — N184 Chronic kidney disease, stage 4 (severe): Secondary | ICD-10-CM | POA: Diagnosis not present

## 2022-04-03 DIAGNOSIS — I5022 Chronic systolic (congestive) heart failure: Secondary | ICD-10-CM | POA: Diagnosis not present

## 2022-04-03 DIAGNOSIS — N3281 Overactive bladder: Secondary | ICD-10-CM | POA: Diagnosis not present

## 2022-04-03 DIAGNOSIS — I4891 Unspecified atrial fibrillation: Secondary | ICD-10-CM | POA: Diagnosis not present

## 2022-04-03 DIAGNOSIS — H35033 Hypertensive retinopathy, bilateral: Secondary | ICD-10-CM | POA: Diagnosis not present

## 2022-04-03 DIAGNOSIS — E7800 Pure hypercholesterolemia, unspecified: Secondary | ICD-10-CM | POA: Diagnosis not present

## 2022-04-03 DIAGNOSIS — I130 Hypertensive heart and chronic kidney disease with heart failure and stage 1 through stage 4 chronic kidney disease, or unspecified chronic kidney disease: Secondary | ICD-10-CM | POA: Diagnosis not present

## 2022-04-03 DIAGNOSIS — Z794 Long term (current) use of insulin: Secondary | ICD-10-CM | POA: Diagnosis not present

## 2022-04-03 DIAGNOSIS — N529 Male erectile dysfunction, unspecified: Secondary | ICD-10-CM | POA: Diagnosis not present

## 2022-04-03 DIAGNOSIS — Z7901 Long term (current) use of anticoagulants: Secondary | ICD-10-CM | POA: Diagnosis not present

## 2022-04-03 DIAGNOSIS — F3289 Other specified depressive episodes: Secondary | ICD-10-CM | POA: Diagnosis not present

## 2022-04-03 DIAGNOSIS — E1122 Type 2 diabetes mellitus with diabetic chronic kidney disease: Secondary | ICD-10-CM | POA: Diagnosis not present

## 2022-04-03 DIAGNOSIS — E1165 Type 2 diabetes mellitus with hyperglycemia: Secondary | ICD-10-CM | POA: Diagnosis not present

## 2022-04-03 DIAGNOSIS — M1711 Unilateral primary osteoarthritis, right knee: Secondary | ICD-10-CM | POA: Diagnosis not present

## 2022-04-03 DIAGNOSIS — N184 Chronic kidney disease, stage 4 (severe): Secondary | ICD-10-CM | POA: Diagnosis not present

## 2022-04-03 DIAGNOSIS — N400 Enlarged prostate without lower urinary tract symptoms: Secondary | ICD-10-CM | POA: Diagnosis not present

## 2022-04-03 DIAGNOSIS — M5136 Other intervertebral disc degeneration, lumbar region: Secondary | ICD-10-CM | POA: Diagnosis not present

## 2022-05-07 DIAGNOSIS — Z794 Long term (current) use of insulin: Secondary | ICD-10-CM | POA: Diagnosis not present

## 2022-05-07 DIAGNOSIS — E7800 Pure hypercholesterolemia, unspecified: Secondary | ICD-10-CM | POA: Diagnosis not present

## 2022-05-07 DIAGNOSIS — I5022 Chronic systolic (congestive) heart failure: Secondary | ICD-10-CM | POA: Diagnosis not present

## 2022-05-07 DIAGNOSIS — N529 Male erectile dysfunction, unspecified: Secondary | ICD-10-CM | POA: Diagnosis not present

## 2022-05-07 DIAGNOSIS — F3289 Other specified depressive episodes: Secondary | ICD-10-CM | POA: Diagnosis not present

## 2022-05-07 DIAGNOSIS — N184 Chronic kidney disease, stage 4 (severe): Secondary | ICD-10-CM | POA: Diagnosis not present

## 2022-05-07 DIAGNOSIS — E1122 Type 2 diabetes mellitus with diabetic chronic kidney disease: Secondary | ICD-10-CM | POA: Diagnosis not present

## 2022-05-07 DIAGNOSIS — I4891 Unspecified atrial fibrillation: Secondary | ICD-10-CM | POA: Diagnosis not present

## 2022-05-07 DIAGNOSIS — I130 Hypertensive heart and chronic kidney disease with heart failure and stage 1 through stage 4 chronic kidney disease, or unspecified chronic kidney disease: Secondary | ICD-10-CM | POA: Diagnosis not present

## 2022-05-07 DIAGNOSIS — Z7901 Long term (current) use of anticoagulants: Secondary | ICD-10-CM | POA: Diagnosis not present

## 2022-05-07 DIAGNOSIS — M1711 Unilateral primary osteoarthritis, right knee: Secondary | ICD-10-CM | POA: Diagnosis not present

## 2022-05-07 DIAGNOSIS — M5136 Other intervertebral disc degeneration, lumbar region: Secondary | ICD-10-CM | POA: Diagnosis not present

## 2022-05-07 DIAGNOSIS — N3281 Overactive bladder: Secondary | ICD-10-CM | POA: Diagnosis not present

## 2022-05-07 DIAGNOSIS — N400 Enlarged prostate without lower urinary tract symptoms: Secondary | ICD-10-CM | POA: Diagnosis not present

## 2022-05-07 DIAGNOSIS — E1165 Type 2 diabetes mellitus with hyperglycemia: Secondary | ICD-10-CM | POA: Diagnosis not present

## 2022-05-07 DIAGNOSIS — H35033 Hypertensive retinopathy, bilateral: Secondary | ICD-10-CM | POA: Diagnosis not present

## 2022-05-22 DIAGNOSIS — L0291 Cutaneous abscess, unspecified: Secondary | ICD-10-CM | POA: Diagnosis not present

## 2022-06-22 DIAGNOSIS — M47816 Spondylosis without myelopathy or radiculopathy, lumbar region: Secondary | ICD-10-CM | POA: Diagnosis not present

## 2022-06-26 DIAGNOSIS — L02414 Cutaneous abscess of left upper limb: Secondary | ICD-10-CM | POA: Diagnosis not present

## 2022-07-01 ENCOUNTER — Other Ambulatory Visit: Payer: Self-pay | Admitting: Cardiology

## 2022-07-01 DIAGNOSIS — I4819 Other persistent atrial fibrillation: Secondary | ICD-10-CM

## 2022-07-01 NOTE — Telephone Encounter (Signed)
Prescription refill request for Eliquis received. Indication: Afib  Last office visit: 07/15/21 (Camnitz) Scr: 1.91 (03/16/22 via [facility]) Age: 88 Weight: 81.2kg  Appropriate dose and refill sent to requested pharmacy.

## 2022-08-12 DIAGNOSIS — Z6826 Body mass index (BMI) 26.0-26.9, adult: Secondary | ICD-10-CM | POA: Diagnosis not present

## 2022-08-12 DIAGNOSIS — H60331 Swimmer's ear, right ear: Secondary | ICD-10-CM | POA: Diagnosis not present

## 2022-08-12 DIAGNOSIS — M47816 Spondylosis without myelopathy or radiculopathy, lumbar region: Secondary | ICD-10-CM | POA: Diagnosis not present

## 2022-08-12 DIAGNOSIS — G894 Chronic pain syndrome: Secondary | ICD-10-CM | POA: Diagnosis not present

## 2022-08-23 ENCOUNTER — Other Ambulatory Visit: Payer: Self-pay | Admitting: Cardiology

## 2022-08-26 DIAGNOSIS — H60331 Swimmer's ear, right ear: Secondary | ICD-10-CM | POA: Diagnosis not present

## 2022-09-09 DIAGNOSIS — R197 Diarrhea, unspecified: Secondary | ICD-10-CM | POA: Diagnosis not present

## 2022-09-09 DIAGNOSIS — R630 Anorexia: Secondary | ICD-10-CM | POA: Diagnosis not present

## 2022-09-09 DIAGNOSIS — R739 Hyperglycemia, unspecified: Secondary | ICD-10-CM | POA: Diagnosis not present

## 2022-09-09 DIAGNOSIS — R410 Disorientation, unspecified: Secondary | ICD-10-CM | POA: Diagnosis not present

## 2022-09-10 ENCOUNTER — Inpatient Hospital Stay (HOSPITAL_BASED_OUTPATIENT_CLINIC_OR_DEPARTMENT_OTHER)
Admission: RE | Admit: 2022-09-10 | Discharge: 2022-09-10 | Disposition: A | Discharge status: Home / Self Care | Payer: Medicare Other | Source: Ambulatory Visit | Attending: Family Medicine | Admitting: Family Medicine

## 2022-09-10 ENCOUNTER — Other Ambulatory Visit (HOSPITAL_BASED_OUTPATIENT_CLINIC_OR_DEPARTMENT_OTHER): Payer: Self-pay | Admitting: Family Medicine

## 2022-09-10 DIAGNOSIS — R109 Unspecified abdominal pain: Secondary | ICD-10-CM | POA: Insufficient documentation

## 2022-09-10 DIAGNOSIS — Z9185 Personal history of military service: Secondary | ICD-10-CM | POA: Diagnosis not present

## 2022-09-10 DIAGNOSIS — K5732 Diverticulitis of large intestine without perforation or abscess without bleeding: Secondary | ICD-10-CM | POA: Diagnosis not present

## 2022-09-10 DIAGNOSIS — E1165 Type 2 diabetes mellitus with hyperglycemia: Secondary | ICD-10-CM | POA: Diagnosis not present

## 2022-09-10 DIAGNOSIS — N179 Acute kidney failure, unspecified: Secondary | ICD-10-CM | POA: Diagnosis not present

## 2022-09-10 DIAGNOSIS — K219 Gastro-esophageal reflux disease without esophagitis: Secondary | ICD-10-CM | POA: Diagnosis not present

## 2022-09-10 DIAGNOSIS — Z974 Presence of external hearing-aid: Secondary | ICD-10-CM | POA: Diagnosis not present

## 2022-09-10 DIAGNOSIS — Z79899 Other long term (current) drug therapy: Secondary | ICD-10-CM | POA: Diagnosis not present

## 2022-09-10 DIAGNOSIS — Z8249 Family history of ischemic heart disease and other diseases of the circulatory system: Secondary | ICD-10-CM | POA: Diagnosis not present

## 2022-09-10 DIAGNOSIS — Z794 Long term (current) use of insulin: Secondary | ICD-10-CM | POA: Diagnosis not present

## 2022-09-10 DIAGNOSIS — N1832 Chronic kidney disease, stage 3b: Secondary | ICD-10-CM | POA: Diagnosis not present

## 2022-09-10 DIAGNOSIS — Z20822 Contact with and (suspected) exposure to covid-19: Secondary | ICD-10-CM | POA: Diagnosis not present

## 2022-09-10 DIAGNOSIS — K5730 Diverticulosis of large intestine without perforation or abscess without bleeding: Secondary | ICD-10-CM | POA: Diagnosis not present

## 2022-09-10 DIAGNOSIS — I5032 Chronic diastolic (congestive) heart failure: Secondary | ICD-10-CM | POA: Diagnosis not present

## 2022-09-10 DIAGNOSIS — Z7901 Long term (current) use of anticoagulants: Secondary | ICD-10-CM | POA: Diagnosis not present

## 2022-09-10 DIAGNOSIS — E7800 Pure hypercholesterolemia, unspecified: Secondary | ICD-10-CM | POA: Diagnosis not present

## 2022-09-10 DIAGNOSIS — H9193 Unspecified hearing loss, bilateral: Secondary | ICD-10-CM | POA: Diagnosis not present

## 2022-09-10 DIAGNOSIS — K5792 Diverticulitis of intestine, part unspecified, without perforation or abscess without bleeding: Secondary | ICD-10-CM | POA: Diagnosis not present

## 2022-09-10 DIAGNOSIS — I130 Hypertensive heart and chronic kidney disease with heart failure and stage 1 through stage 4 chronic kidney disease, or unspecified chronic kidney disease: Secondary | ICD-10-CM | POA: Diagnosis not present

## 2022-09-10 DIAGNOSIS — R197 Diarrhea, unspecified: Secondary | ICD-10-CM | POA: Diagnosis not present

## 2022-09-10 DIAGNOSIS — N400 Enlarged prostate without lower urinary tract symptoms: Secondary | ICD-10-CM | POA: Diagnosis not present

## 2022-09-10 DIAGNOSIS — M6281 Muscle weakness (generalized): Secondary | ICD-10-CM | POA: Diagnosis not present

## 2022-09-10 DIAGNOSIS — I4819 Other persistent atrial fibrillation: Secondary | ICD-10-CM | POA: Diagnosis not present

## 2022-09-10 DIAGNOSIS — N281 Cyst of kidney, acquired: Secondary | ICD-10-CM | POA: Diagnosis not present

## 2022-09-10 DIAGNOSIS — R1032 Left lower quadrant pain: Secondary | ICD-10-CM | POA: Diagnosis not present

## 2022-09-10 DIAGNOSIS — E1122 Type 2 diabetes mellitus with diabetic chronic kidney disease: Secondary | ICD-10-CM | POA: Diagnosis not present

## 2022-09-10 DIAGNOSIS — D631 Anemia in chronic kidney disease: Secondary | ICD-10-CM | POA: Diagnosis not present

## 2022-09-10 DIAGNOSIS — E876 Hypokalemia: Secondary | ICD-10-CM | POA: Diagnosis not present

## 2022-09-12 ENCOUNTER — Emergency Department (HOSPITAL_COMMUNITY): Payer: Medicare Other

## 2022-09-12 ENCOUNTER — Encounter (HOSPITAL_COMMUNITY): Payer: Self-pay | Admitting: Emergency Medicine

## 2022-09-12 ENCOUNTER — Observation Stay (HOSPITAL_COMMUNITY)
Admission: EM | Admit: 2022-09-12 | Discharge: 2022-09-15 | DRG: 392 | Disposition: A | Discharge status: Home / Self Care | Payer: Medicare Other | Attending: Internal Medicine | Admitting: Internal Medicine

## 2022-09-12 DIAGNOSIS — Z884 Allergy status to anesthetic agent status: Secondary | ICD-10-CM | POA: Diagnosis not present

## 2022-09-12 DIAGNOSIS — K5732 Diverticulitis of large intestine without perforation or abscess without bleeding: Secondary | ICD-10-CM | POA: Diagnosis present

## 2022-09-12 DIAGNOSIS — Z885 Allergy status to narcotic agent status: Secondary | ICD-10-CM | POA: Diagnosis not present

## 2022-09-12 DIAGNOSIS — K5792 Diverticulitis of intestine, part unspecified, without perforation or abscess without bleeding: Secondary | ICD-10-CM | POA: Diagnosis present

## 2022-09-12 DIAGNOSIS — Z794 Long term (current) use of insulin: Secondary | ICD-10-CM

## 2022-09-12 DIAGNOSIS — H9193 Unspecified hearing loss, bilateral: Secondary | ICD-10-CM | POA: Diagnosis present

## 2022-09-12 DIAGNOSIS — E1122 Type 2 diabetes mellitus with diabetic chronic kidney disease: Secondary | ICD-10-CM | POA: Diagnosis present

## 2022-09-12 DIAGNOSIS — I4819 Other persistent atrial fibrillation: Secondary | ICD-10-CM | POA: Diagnosis present

## 2022-09-12 DIAGNOSIS — Z9185 Personal history of military service: Secondary | ICD-10-CM

## 2022-09-12 DIAGNOSIS — I5032 Chronic diastolic (congestive) heart failure: Secondary | ICD-10-CM | POA: Diagnosis present

## 2022-09-12 DIAGNOSIS — R197 Diarrhea, unspecified: Secondary | ICD-10-CM | POA: Insufficient documentation

## 2022-09-12 DIAGNOSIS — E1165 Type 2 diabetes mellitus with hyperglycemia: Secondary | ICD-10-CM | POA: Diagnosis present

## 2022-09-12 DIAGNOSIS — Z974 Presence of external hearing-aid: Secondary | ICD-10-CM

## 2022-09-12 DIAGNOSIS — N281 Cyst of kidney, acquired: Secondary | ICD-10-CM | POA: Diagnosis not present

## 2022-09-12 DIAGNOSIS — N1832 Chronic kidney disease, stage 3b: Secondary | ICD-10-CM | POA: Diagnosis present

## 2022-09-12 DIAGNOSIS — Z79899 Other long term (current) drug therapy: Secondary | ICD-10-CM

## 2022-09-12 DIAGNOSIS — Z7901 Long term (current) use of anticoagulants: Secondary | ICD-10-CM

## 2022-09-12 DIAGNOSIS — N179 Acute kidney failure, unspecified: Secondary | ICD-10-CM | POA: Diagnosis present

## 2022-09-12 DIAGNOSIS — D631 Anemia in chronic kidney disease: Secondary | ICD-10-CM | POA: Diagnosis present

## 2022-09-12 DIAGNOSIS — E7800 Pure hypercholesterolemia, unspecified: Secondary | ICD-10-CM | POA: Diagnosis present

## 2022-09-12 DIAGNOSIS — Z20822 Contact with and (suspected) exposure to covid-19: Secondary | ICD-10-CM | POA: Diagnosis present

## 2022-09-12 DIAGNOSIS — K219 Gastro-esophageal reflux disease without esophagitis: Secondary | ICD-10-CM | POA: Diagnosis present

## 2022-09-12 DIAGNOSIS — M6281 Muscle weakness (generalized): Secondary | ICD-10-CM | POA: Diagnosis present

## 2022-09-12 DIAGNOSIS — I130 Hypertensive heart and chronic kidney disease with heart failure and stage 1 through stage 4 chronic kidney disease, or unspecified chronic kidney disease: Secondary | ICD-10-CM | POA: Diagnosis present

## 2022-09-12 DIAGNOSIS — N400 Enlarged prostate without lower urinary tract symptoms: Secondary | ICD-10-CM | POA: Diagnosis present

## 2022-09-12 DIAGNOSIS — Z8249 Family history of ischemic heart disease and other diseases of the circulatory system: Secondary | ICD-10-CM | POA: Diagnosis not present

## 2022-09-12 DIAGNOSIS — R1032 Left lower quadrant pain: Secondary | ICD-10-CM | POA: Insufficient documentation

## 2022-09-12 DIAGNOSIS — Z96652 Presence of left artificial knee joint: Secondary | ICD-10-CM | POA: Diagnosis present

## 2022-09-12 DIAGNOSIS — K5730 Diverticulosis of large intestine without perforation or abscess without bleeding: Secondary | ICD-10-CM | POA: Diagnosis not present

## 2022-09-12 DIAGNOSIS — E876 Hypokalemia: Secondary | ICD-10-CM | POA: Diagnosis not present

## 2022-09-12 LAB — CBC WITH DIFFERENTIAL/PLATELET
Abs Immature Granulocytes: 0.04 10*3/uL (ref 0.00–0.07)
Basophils Absolute: 0 10*3/uL (ref 0.0–0.1)
Basophils Relative: 0 %
Eosinophils Absolute: 0 10*3/uL (ref 0.0–0.5)
Eosinophils Relative: 0 %
HCT: 34.4 % — ABNORMAL LOW (ref 39.0–52.0)
Hemoglobin: 11.7 g/dL — ABNORMAL LOW (ref 13.0–17.0)
Immature Granulocytes: 0 %
Lymphocytes Relative: 7 %
Lymphs Abs: 0.9 10*3/uL (ref 0.7–4.0)
MCH: 32.1 pg (ref 26.0–34.0)
MCHC: 34 g/dL (ref 30.0–36.0)
MCV: 94.5 fL (ref 80.0–100.0)
Monocytes Absolute: 0.8 10*3/uL (ref 0.1–1.0)
Monocytes Relative: 6 %
Neutro Abs: 11.2 10*3/uL — ABNORMAL HIGH (ref 1.7–7.7)
Neutrophils Relative %: 87 %
Platelets: 233 10*3/uL (ref 150–400)
RBC: 3.64 MIL/uL — ABNORMAL LOW (ref 4.22–5.81)
RDW: 12.7 % (ref 11.5–15.5)
WBC: 13 10*3/uL — ABNORMAL HIGH (ref 4.0–10.5)
nRBC: 0 % (ref 0.0–0.2)

## 2022-09-12 LAB — COMPREHENSIVE METABOLIC PANEL
ALT: 37 U/L (ref 0–44)
AST: 43 U/L — ABNORMAL HIGH (ref 15–41)
Albumin: 3.8 g/dL (ref 3.5–5.0)
Alkaline Phosphatase: 71 U/L (ref 38–126)
Anion gap: 13 (ref 5–15)
BUN: 54 mg/dL — ABNORMAL HIGH (ref 8–23)
CO2: 22 mmol/L (ref 22–32)
Calcium: 10.1 mg/dL (ref 8.9–10.3)
Chloride: 100 mmol/L (ref 98–111)
Creatinine, Ser: 2.01 mg/dL — ABNORMAL HIGH (ref 0.61–1.24)
GFR, Estimated: 30 mL/min — ABNORMAL LOW (ref >60)
Glucose, Bld: 249 mg/dL — ABNORMAL HIGH (ref 70–99)
Potassium: 3.7 mmol/L (ref 3.5–5.1)
Sodium: 135 mmol/L (ref 135–145)
Total Bilirubin: 2.1 mg/dL — ABNORMAL HIGH (ref 0.3–1.2)
Total Protein: 6.6 g/dL (ref 6.5–8.1)

## 2022-09-12 LAB — RESP PANEL BY RT-PCR (FLU A&B, COVID) ARPGX2
Influenza A by PCR: NEGATIVE
Influenza B by PCR: NEGATIVE
SARS Coronavirus 2 by RT PCR: NEGATIVE

## 2022-09-12 LAB — URINALYSIS, ROUTINE W REFLEX MICROSCOPIC
Bilirubin Urine: NEGATIVE
Glucose, UA: 100 mg/dL — AB
Hgb urine dipstick: NEGATIVE
Ketones, ur: NEGATIVE mg/dL
Leukocytes,Ua: NEGATIVE
Nitrite: NEGATIVE
Protein, ur: 30 mg/dL — AB
Specific Gravity, Urine: 1.015 (ref 1.005–1.030)
pH: 5.5 (ref 5.0–8.0)

## 2022-09-12 LAB — C DIFFICILE QUICK SCREEN W PCR REFLEX
C Diff antigen: NEGATIVE
C Diff interpretation: NOT DETECTED
C Diff toxin: NEGATIVE

## 2022-09-12 LAB — GLUCOSE, CAPILLARY
Glucose-Capillary: 211 mg/dL — ABNORMAL HIGH (ref 70–99)
Glucose-Capillary: 94 mg/dL (ref 70–99)

## 2022-09-12 LAB — URINALYSIS, MICROSCOPIC (REFLEX)
Bacteria, UA: NONE SEEN
RBC / HPF: NONE SEEN RBC/hpf (ref 0–5)
WBC, UA: NONE SEEN WBC/hpf (ref 0–5)

## 2022-09-12 LAB — LIPASE, BLOOD: Lipase: 21 U/L (ref 11–51)

## 2022-09-12 LAB — HEMOGLOBIN A1C
Hgb A1c MFr Bld: 8.3 % — ABNORMAL HIGH (ref 4.8–5.6)
Mean Plasma Glucose: 191.51 mg/dL

## 2022-09-12 LAB — POC OCCULT BLOOD, ED: Fecal Occult Bld: NEGATIVE

## 2022-09-12 MED ORDER — METRONIDAZOLE 500 MG/100ML IV SOLN
500.0000 mg | Freq: Once | INTRAVENOUS | Status: AC
  Administered 2022-09-12: 500 mg via INTRAVENOUS
  Filled 2022-09-12: qty 100

## 2022-09-12 MED ORDER — FINASTERIDE 5 MG PO TABS
5.0000 mg | ORAL_TABLET | Freq: Every day | ORAL | Status: DC
  Administered 2022-09-12 – 2022-09-15 (×4): 5 mg via ORAL
  Filled 2022-09-12 (×4): qty 1

## 2022-09-12 MED ORDER — AMLODIPINE BESYLATE 5 MG PO TABS
2.5000 mg | ORAL_TABLET | Freq: Every day | ORAL | Status: DC
  Administered 2022-09-12 – 2022-09-15 (×4): 2.5 mg via ORAL
  Filled 2022-09-12 (×4): qty 1

## 2022-09-12 MED ORDER — SODIUM CHLORIDE 0.9 % IV SOLN
2.0000 g | INTRAVENOUS | Status: DC
  Administered 2022-09-13 – 2022-09-14 (×2): 2 g via INTRAVENOUS
  Filled 2022-09-12 (×3): qty 20

## 2022-09-12 MED ORDER — INSULIN ASPART 100 UNIT/ML IJ SOLN
0.0000 [IU] | Freq: Every day | INTRAMUSCULAR | Status: DC
  Administered 2022-09-13: 3 [IU] via SUBCUTANEOUS

## 2022-09-12 MED ORDER — LACTATED RINGERS IV BOLUS
500.0000 mL | Freq: Once | INTRAVENOUS | Status: AC
  Administered 2022-09-12: 500 mL via INTRAVENOUS

## 2022-09-12 MED ORDER — TRAZODONE HCL 100 MG PO TABS
50.0000 mg | ORAL_TABLET | Freq: Every day | ORAL | Status: DC
  Administered 2022-09-12 – 2022-09-14 (×3): 50 mg via ORAL
  Filled 2022-09-12 (×3): qty 1

## 2022-09-12 MED ORDER — MIRABEGRON ER 25 MG PO TB24
50.0000 mg | ORAL_TABLET | Freq: Every day | ORAL | Status: DC
  Administered 2022-09-12 – 2022-09-14 (×3): 50 mg via ORAL
  Filled 2022-09-12 (×3): qty 2

## 2022-09-12 MED ORDER — POLYVINYL ALCOHOL 1.4 % OP SOLN: 1.0000 [drp] | Freq: Every day | OPHTHALMIC | Status: DC | PRN

## 2022-09-12 MED ORDER — SODIUM CHLORIDE 0.9 % IV SOLN
2.0000 g | Freq: Once | INTRAVENOUS | Status: AC
  Administered 2022-09-12: 2 g via INTRAVENOUS
  Filled 2022-09-12: qty 20

## 2022-09-12 MED ORDER — ONDANSETRON HCL 4 MG/2ML IJ SOLN: 4.0000 mg | Freq: Four times a day (QID) | INTRAMUSCULAR | Status: DC | PRN

## 2022-09-12 MED ORDER — ONDANSETRON HCL 4 MG PO TABS: 4.0000 mg | ORAL_TABLET | Freq: Four times a day (QID) | ORAL | Status: DC | PRN

## 2022-09-12 MED ORDER — INSULIN ASPART 100 UNIT/ML IJ SOLN
0.0000 [IU] | Freq: Three times a day (TID) | INTRAMUSCULAR | Status: DC
  Administered 2022-09-12: 5 [IU] via SUBCUTANEOUS
  Administered 2022-09-13: 3 [IU] via SUBCUTANEOUS
  Administered 2022-09-13: 5 [IU] via SUBCUTANEOUS
  Administered 2022-09-14: 3 [IU] via SUBCUTANEOUS
  Administered 2022-09-14: 5 [IU] via SUBCUTANEOUS
  Administered 2022-09-15: 3 [IU] via SUBCUTANEOUS

## 2022-09-12 MED ORDER — SIMVASTATIN 20 MG PO TABS
20.0000 mg | ORAL_TABLET | Freq: Every day | ORAL | Status: DC
  Administered 2022-09-12 – 2022-09-14 (×3): 20 mg via ORAL
  Filled 2022-09-12 (×3): qty 1

## 2022-09-12 MED ORDER — APIXABAN 2.5 MG PO TABS
2.5000 mg | ORAL_TABLET | Freq: Two times a day (BID) | ORAL | Status: DC
  Administered 2022-09-12 – 2022-09-15 (×7): 2.5 mg via ORAL
  Filled 2022-09-12 (×7): qty 1

## 2022-09-12 NOTE — H&P (Signed)
History and Physical    Patient: Daniel Cooke TFT:[id number] DOB: 1923/12/20 DOA: 09/12/2022 DOS: the patient was seen and examined on 09/12/2022 PCP: Shon Hale, MD  Patient coming from: Home  Chief Complaint:  Chief Complaint  Patient presents with   Diarrhea   HPI: Daniel Cooke is a 88 y.o. male with medical history significant of DM2, persistent a fib on eliquis, CKD3b, HTN. Presenting with diarrhea. History is from daughter. She reports that he's had about a week of diarrhea. No fevers. He's had some N but no V. They spoke with his PCP who recommended imodium and a CT. The CT was unrevealing. The imodium seemed to help, so it was stopped. However 2 days ago, his diarrhea resumed. He has had poor appetite and become increasingly fatigued since. When his symptoms did not improve this morning, they decided to come to the ED for evaluation. They deny any other aggravating or alleviating factors.   Review of Systems: As mentioned in the history of present illness. All other systems reviewed and are negative. Past Medical History:  Diagnosis Date   Arthritis    "was in left knee before replacement; now have it in my right knee" (11/25/2016)   BPH (benign prostatic hypertrophy)    Chronic diastolic CHF (congestive heart failure) (HCC)    a. 02/2016 Echo: EF 60-65%, no rwma, triv AI, mild MR, mildly to mod dil LA, mod dil RA, PASP .   Dysrhythmia    Essential hypertension    Hard of hearing    wears bilateral hearing aids   Hypercholesterolemia    Osteoarthritis    PAF (paroxysmal atrial fibrillation) (HCC)    a. CHADS2VASC score of 4 --> coumadin.   Pilonidal cyst    PAST HX - NO PROBLEM NOW   Pneumonia    "one time; years ago" (11/25/2016)   Shingles    "long time ago"   Synovitis of knee 02/27/2014   Type II diabetes mellitus (HCC)    oral meds - no insulin   Past Surgical History:  Procedure Laterality Date   APPENDECTOMY     CARDIOVERSION N/A  02/04/2017   Procedure: CARDIOVERSION;  Surgeon: [name], MD;  Location: [facility] ENDOSCOPY;  Service: Cardiovascular;  Laterality: N/A;   CATARACT EXTRACTION W/ INTRAOCULAR LENS IMPLANT Left 10/2016   CHOLECYSTECTOMY OPEN     COLONOSCOPY W/ BIOPSIES  08/2005   [name] 04/27/2011   EYE SURGERY Right    "right" traumatic cataract removed--injury to the eye--states his eyesight is ok in left eye   JOINT REPLACEMENT     KNEE ARTHROSCOPY Left 02/28/2014   Procedure: ARTHROSCOPY LEFT KNEE WITH SYNOVECTOMY;  Surgeon: [name], MD;  Location: [facility] ORS;  Service: Orthopedics;  Laterality: Left;   KYPHOPLASTY  2017   KYPHOPLASTY N/A 05/28/2017   Procedure: Lumbar Four Kyphoplasty;  Surgeon: Maeola Harman, MD;  Location: [facility] OR;  Service: Neurosurgery;  Laterality: N/A;  L4 Kyphoplasty   SHOULDER OPEN ROTATOR CUFF REPAIR Left    TONSILLECTOMY     TOTAL KNEE ARTHROPLASTY  02/08/2012   Procedure: TOTAL KNEE ARTHROPLASTY;  Surgeon: [name], MD;  Location: [facility] ORS;  Service: Orthopedics;  Laterality: Left;   VASECTOMY     Social History:  reports that he has never smoked. He has never used smokeless tobacco. He reports that he does not drink alcohol and does not use drugs.  Allergies  Allergen Reactions   Codeine Nausea And Vomiting and Other (See  Comments)    MAKES ME CRAZY   Oxycodone Nausea And Vomiting and Other (See Comments)    MAKES ME CRAZY   Nsaids     Other reaction(s): Contraindicated d/t CKD   Other Other (See Comments)    All narcotic pain medicine makes him crazy.    Oxybutynin     Other reaction(s): dry mouth   Tramadol Hcl     Other reaction(s): altered mental status    Family History  Problem Relation Age of Onset   Heart disease Mother    Heart disease Brother    Heart attack Brother     Prior to Admission medications   Medication Sig Start Date End Date Taking? Authorizing Provider  amLODipine (NORVASC) 2.5 MG tablet Take 2.5 mg by mouth daily. 05/06/21    [provider]  apixaban Arne Cleveland) 2.5 MG TABS tablet Take 1 tablet by mouth twice daily 07/01/22   Camnitz, Ocie Doyne, MD  CALCIUM PO Take 1 tablet by mouth daily.    [provider]  Cholecalciferol (VITAMIN D3 PO) Take 1 tablet by mouth daily.    [provider]  Continuous Blood Gluc Receiver (FREESTYLE LIBRE 14 DAY READER) [place] See admin instructions. 12/01/19   [provider]  finasteride (PROSCAR) 5 MG tablet Take 5 mg by mouth daily. 05/01/19   [provider]  furosemide (LASIX) 40 MG tablet Take 1/2 (one-half) tablet by mouth once daily 08/25/22   Camnitz, Ocie Doyne, MD  HUMALOG KWIKPEN 100 UNIT/ML KwikPen Inject 2 Units into the skin daily with supper.  12/02/18   [provider]  Insulin Glargine (LANTUS SOLOSTAR) 100 UNIT/ML Solostar Pen Inject 4 Units into the skin at bedtime. 11/27/19   Antonieta Pert, MD  Insulin Pen Needle (BD PEN NEEDLE NANO U/F) 32G X 4 MM MISC USE AS DIRECTED ONCE DAILY FOR 30 DAYS 10/28/18   [provider]  MYRBETRIQ 50 MG TB24 tablet Take 50 mg by mouth daily. 03/14/21   [provider]  simvastatin (ZOCOR) 20 MG tablet Take 20 mg by mouth at bedtime.    [provider]  traZODone (DESYREL) 50 MG tablet Take 50 mg by mouth at bedtime. 12/04/19   [provider]    Physical Exam: Vitals:   09/12/22 0630 09/12/22 0800 09/12/22 0930 09/12/22 0951  BP: (!) 113/50 (!) 114/92 (!) 153/98   Pulse: 78 72 83   Resp: 20 18 18    Temp:    98.1 F (36.7 C)  TempSrc:    Oral  SpO2: 98% 99% 96%    General: 88 y.o. male resting in bed in NAD Eyes: PERRL, normal sclera ENMT: Nares patent w/o discharge, orophaynx clear, dentition normal, ears w/o discharge/lesions/ulcers Neck: Supple, trachea midline Cardiovascular: RRR, +S1, S2, no m/g/r, equal pulses throughout Respiratory: CTABL, no w/r/r, normal WOB GI: BS+, NDNT, no masses noted, no organomegaly noted MSK: No e/c/c Neuro:  A&O x 3, other than HoH, no focal deficits Psyc: Appropriate interaction and affect, calm/cooperative  Data Reviewed:  Results for orders placed or performed during the hospital encounter of 09/12/22 (from the past 24 hour(s))  Comprehensive metabolic panel     Status: Abnormal   Collection Time: 09/12/22  5:40 AM  Result Value Ref Range   Sodium 135 135 - 145 mmol/L   Potassium 3.7 3.5 - 5.1 mmol/L   Chloride 100 98 - 111 mmol/L   CO2 22 22 - 32 mmol/L   Glucose, Bld 249 (H) 70 -  99 mg/dL   BUN 54 (H) 8 - 23 mg/dL   Creatinine, Ser 6.71 (H) 0.61 - 1.24 mg/dL   Calcium 24.5 8.9 - 80.9 mg/dL   Total Protein 6.6 6.5 - 8.1 g/dL   Albumin 3.8 3.5 - 5.0 g/dL   AST 43 (H) 15 - 41 U/L   ALT 37 0 - 44 U/L   Alkaline Phosphatase 71 38 - 126 U/L   Total Bilirubin 2.1 (H) 0.3 - 1.2 mg/dL   GFR, Estimated 30 (L) >60 mL/min   Anion gap 13 5 - 15  Lipase, blood     Status: None   Collection Time: 09/12/22  5:40 AM  Result Value Ref Range   Lipase 21 11 - 51 U/L  CBC with Diff     Status: Abnormal   Collection Time: 09/12/22  5:40 AM  Result Value Ref Range   WBC 13.0 (H) 4.0 - 10.5 K/uL   RBC 3.64 (L) 4.22 - 5.81 MIL/uL   Hemoglobin 11.7 (L) 13.0 - 17.0 g/dL   HCT 98.3 (L) 38.2 - 50.5 %   MCV 94.5 80.0 - 100.0 fL   MCH 32.1 26.0 - 34.0 pg   MCHC 34.0 30.0 - 36.0 g/dL   RDW 39.7 67.3 - 41.9 %   Platelets 233 150 - 400 K/uL   nRBC 0.0 0.0 - 0.2 %   Neutrophils Relative % 87 %   Neutro Abs 11.2 (H) 1.7 - 7.7 K/uL   Lymphocytes Relative 7 %   Lymphs Abs 0.9 0.7 - 4.0 K/uL   Monocytes Relative 6 %   Monocytes Absolute 0.8 0.1 - 1.0 K/uL   Eosinophils Relative 0 %   Eosinophils Absolute 0.0 0.0 - 0.5 K/uL   Basophils Relative 0 %   Basophils Absolute 0.0 0.0 - 0.1 K/uL   Immature Granulocytes 0 %   Abs Immature Granulocytes 0.04 0.00 - 0.07 K/uL  POC occult blood, ED RN will collect     Status: None   Collection Time: 09/12/22  6:17 AM  Result Value Ref Range   Fecal Occult Bld  NEGATIVE NEGATIVE  Resp Panel by RT-PCR (Flu A&B, Covid) Anterior Nasal Swab     Status: None   Collection Time: 09/12/22  7:05 AM   Specimen: Anterior Nasal Swab  Result Value Ref Range   SARS Coronavirus 2 by RT PCR NEGATIVE NEGATIVE   Influenza A by PCR NEGATIVE NEGATIVE   Influenza B by PCR NEGATIVE NEGATIVE    CT ab/pelvis 1. Suspect mild, uncomplicated acute diverticulitis of the proximal segment of the sigmoid colon. No signs of perforation or abscess. 2. Chronic interstitial reticulation noted within the lung bases. 3. Stable untreated compression fractures at T12 and L5. Treated compression deformities are identified at L1 and L4. 4. Aortic Atherosclerosis (ICD10-I70.0).  Assessment and Plan: Acute diverticulitis     - admit to inpt, tele     - continue abx, fluids     - c diff/GI PCR pending  AKI on CKD3b     - hold lasix; watch nephrotoxins     - imaging w/o obstruction     - fluids  Persistent a fib     - continue home regimen when confirmed  HTN     - hold lasix d/t AKI     - continue home regimen otherwise  DM2     - A1c     - SSI, glucose checks, DM diet  Normocytic anemia     -  no evidence of bleed  GERD     - hold PPI d/t diarrhea  BPH     - continue home regimen when confirmed  HLD     - continue home regimen when confirmed  Advance Care Planning:   Code Status: FULL  Consults: None  Family Communication: w/ daughter by phone  Severity of Illness: The appropriate patient status for this patient is INPATIENT. Inpatient status is judged to be reasonable and necessary in order to provide the required intensity of service to ensure the patient's safety. The patient's presenting symptoms, physical exam findings, and initial radiographic and laboratory data in the context of their chronic comorbidities is felt to place them at high risk for further clinical deterioration. Furthermore, it is not anticipated that the patient will be medically stable  for discharge from the hospital within 2 midnights of admission.   * I certify that at the point of admission it is my clinical judgment that the patient will require inpatient hospital care spanning beyond 2 midnights from the point of admission due to high intensity of service, high risk for further deterioration and high frequency of surveillance required.*  Time spent in coordination of this H&P: 60 minutes  Author: Teddy Spike, DO 09/12/2022 10:05 AM  For on call review [email or web address].

## 2022-09-12 NOTE — ED Notes (Signed)
Pt had large amount of brown liquidy diarrhea covering him from back to toes. Pt was cleaned and changed. Fresh linens, brief, gown, and warm blankets applied.

## 2022-09-12 NOTE — ED Notes (Signed)
Family at the bedside.

## 2022-09-12 NOTE — ED Provider Notes (Signed)
[facility] DEPT Provider Note   CSN: [id number] Arrival date & time: 09/12/22  0501     History  Chief Complaint  Patient presents with   Diarrhea    THAXTON Cooke is a 88 y.o. male with medical history of paroxysmal A-fib on Eliquis, CHF, diabetes, chronic kidney disease.  Patient presents to ED for evaluation of diarrhea.  The patient is hard of hearing, unable to give much information.  The patient history was collected mainly through his daughter who is in his demographic form.  The patient daughter states that since last Saturday the patient has been having persistent diarrhea.  The patient was seen at his doctor on Monday and placed on Imodium.  The patient daughter states that the diarrhea did clear up and Imodium was stopped, diarrhea returned after Imodium was discontinued.  The patient was then taken to see his primary again on Wednesday, had unremarkable CT scan of abdomen pelvis performed at this time.  Patient also had lab work collected which did show an elevated white blood cell count however the patient daughter is unable to give me much more information about this.  Patient daughter denies the patient had any fevers, nausea, vomiting, blood in stool.  The patient daughter did state that the patient did have left lower sided abdominal pain on Wednesday which prompted the CT scan.  Diarrhea      Home Medications Prior to Admission medications   Medication Sig Start Date End Date Taking? Authorizing Provider  acetaminophen (TYLENOL) 500 MG tablet Take 500 mg by mouth 2 (two) times daily.   Yes [provider]  CALCIUM PO Take 600 mg by mouth daily.   Yes [provider]  Cholecalciferol (VITAMIN D3 PO) Take 1,000 Units by mouth daily.   Yes [provider]  HUMALOG KWIKPEN 100 UNIT/ML KwikPen Inject 2 Units into the skin daily with supper.  12/02/18  Yes [provider]  Insulin Glargine (LANTUS SOLOSTAR)  100 UNIT/ML Solostar Pen Inject 4 Units into the skin at bedtime. Patient taking differently: Inject 12-16 Units into the skin at bedtime. 11/27/19  Yes Kc, Maren Beach, MD  amLODipine (NORVASC) 2.5 MG tablet Take 2.5 mg by mouth daily. 05/06/21   [provider]  apixaban Arne Cleveland) 2.5 MG TABS tablet Take 1 tablet by mouth twice daily 07/01/22   Camnitz, Ocie Doyne, MD  Continuous Blood Gluc Receiver (FREESTYLE LIBRE 14 DAY READER) Rhea See admin instructions. 12/01/19   [provider]  finasteride (PROSCAR) 5 MG tablet Take 5 mg by mouth daily. 05/01/19   [provider]  furosemide (LASIX) 40 MG tablet Take 1/2 (one-half) tablet by mouth once daily 08/25/22   Camnitz, Ocie Doyne, MD  Insulin Pen Needle (BD PEN NEEDLE NANO U/F) 32G X 4 MM MISC USE AS DIRECTED ONCE DAILY FOR 30 DAYS 10/28/18   [provider]  MYRBETRIQ 50 MG TB24 tablet Take 50 mg by mouth daily. 03/14/21   [provider]  omeprazole (PRILOSEC) 20 MG capsule Take 20 mg by mouth every morning. 08/13/22   [provider]  simvastatin (ZOCOR) 20 MG tablet Take 20 mg by mouth at bedtime.    [provider]  traZODone (DESYREL) 50 MG tablet Take 50 mg by mouth at bedtime. 12/04/19   [provider]      Allergies    Codeine, Oxycodone, Nsaids, Other, Oxybutynin, and Tramadol hcl    Review of Systems   Review of Systems  Unable to perform ROS: Other (Level 5 caveat)  Gastrointestinal:  Positive for diarrhea.    Physical Exam Updated Vital Signs BP (!) 153/98   Pulse 83   Temp 98.1 F (36.7 C) (Oral)   Resp 18   SpO2 96%  Physical Exam Vitals and nursing note reviewed.  Constitutional:      General: He is not in acute distress.    Appearance: Normal appearance. He is not ill-appearing, toxic-appearing or diaphoretic.  HENT:     Head: Normocephalic and atraumatic.     Nose: Nose normal. No congestion.     Mouth/Throat:     Mouth: Mucous membranes are  moist.     Pharynx: Oropharynx is clear.  Eyes:     Extraocular Movements: Extraocular movements intact.     Conjunctiva/sclera: Conjunctivae normal.     Pupils: Pupils are equal, round, and reactive to light.  Cardiovascular:     Rate and Rhythm: Normal rate and regular rhythm.  Pulmonary:     Effort: Pulmonary effort is normal.     Breath sounds: Normal breath sounds. No wheezing.  Abdominal:     General: Abdomen is flat. Bowel sounds are normal.     Palpations: Abdomen is soft.     Tenderness: There is abdominal tenderness in the left lower quadrant.  Musculoskeletal:     Cervical back: Normal range of motion and neck supple. No tenderness.  Skin:    General: Skin is warm and dry.     Capillary Refill: Capillary refill takes less than 2 seconds.  Neurological:     Mental Status: He is alert and oriented to person, place, and time.     ED Results / Procedures / Treatments   Labs (all labs ordered are listed, but only abnormal results are displayed) Labs Reviewed  COMPREHENSIVE METABOLIC PANEL - Abnormal; Notable for the following components:      Result Value   Glucose, Bld 249 (*)    BUN 54 (*)    Creatinine, Ser 2.01 (*)    AST 43 (*)    Total Bilirubin 2.1 (*)    GFR, Estimated 30 (*)    All other components within normal limits  CBC WITH DIFFERENTIAL/PLATELET - Abnormal; Notable for the following components:   WBC 13.0 (*)    RBC 3.64 (*)    Hemoglobin 11.7 (*)    HCT 34.4 (*)    Neutro Abs 11.2 (*)    All other components within normal limits  RESP PANEL BY RT-PCR (FLU A&B, COVID) ARPGX2  C DIFFICILE QUICK SCREEN W PCR REFLEX    GASTROINTESTINAL PANEL BY PCR, STOOL (REPLACES STOOL CULTURE)  LIPASE, BLOOD  URINALYSIS, ROUTINE W REFLEX MICROSCOPIC  POC OCCULT BLOOD, ED    EKG None  Radiology CT ABDOMEN PELVIS WO CONTRAST  Result Date: 09/12/2022 CLINICAL DATA:  Right upper quadrant rigidity.  Diarrhea. EXAM: CT ABDOMEN AND PELVIS WITHOUT CONTRAST  TECHNIQUE: Multidetector CT imaging of the abdomen and pelvis was performed following the standard protocol without IV contrast. RADIATION DOSE REDUCTION: This exam was performed according to the departmental dose-optimization program which includes automated exposure control, adjustment of the mA and/or kV according to patient size and/or use of iterative reconstruction technique. COMPARISON:  09/10/2022 FINDINGS: Lower chest: No acute abnormality. Chronic interstitial reticulation noted within the lung bases. Hepatobiliary: No focal liver abnormality. Status post cholecystectomy. No signs of biliary dilatation. Pancreas: Unremarkable. No pancreatic ductal dilatation or surrounding inflammatory changes. Spleen: Normal in size without focal abnormality.  Adrenals/Urinary Tract: Normal adrenal glands. Bilateral renal cortical thinning is identified. Bosniak class 1 cyst arises off the inferior pole of the left kidney measuring 1.4 cm, image 35/2. No follow-up imaging recommended. Urinary bladder is unremarkable. Stomach/Bowel: Stomach appears nondistended. Status post appendectomy. No small bowel wall thickening, inflammation, or distension. Sigmoid diverticulosis identified. There is mild wall thickening with surrounding fat stranding involving the proximal segment of the sigmoid colon which is concerning for uncomplicated acute diverticulitis. No signs of perforation or abscess. Vascular/Lymphatic: Aortic atherosclerosis. No abdominopelvic adenopathy. Reproductive: Prostate is unremarkable. Other: There is no free fluid or fluid collections. No signs of pneumoperitoneum. Musculoskeletal: Healed left inferior pubic rami fracture. Spondylosis identified within the thoracolumbar spine. Treated compression deformities are identified at L1 and L4. Stable untreated compression fractures noted at T12 and L5. IMPRESSION: 1. Suspect mild, uncomplicated acute diverticulitis of the proximal segment of the sigmoid colon. No  signs of perforation or abscess. 2. Chronic interstitial reticulation noted within the lung bases. 3. Stable untreated compression fractures at T12 and L5. Treated compression deformities are identified at L1 and L4. 4. Aortic Atherosclerosis (ICD10-I70.0). Electronically Signed   By: Signa Kell M.D.   On: 09/12/2022 09:16   CT ABDOMEN PELVIS WO CONTRAST  Result Date: 09/10/2022 CLINICAL DATA:  Abdominal pain EXAM: CT ABDOMEN AND PELVIS WITHOUT CONTRAST TECHNIQUE: Multidetector CT imaging of the abdomen and pelvis was performed following the standard protocol without IV contrast. RADIATION DOSE REDUCTION: This exam was performed according to the departmental dose-optimization program which includes automated exposure control, adjustment of the mA and/or kV according to patient size and/or use of iterative reconstruction technique. COMPARISON:  None Available. FINDINGS: Lower chest: Subpleural increased interstitial markings are seen in the periphery of both lower lung fields. In image 4 of series 4, there is 3 mm nodule in right middle lobe. No follow-up is recommended. There are scattered coronary artery calcifications. Calcifications are noted in mitral and aortic annulus. Minimal pericardial effusion is present. Hepatobiliary: No focal abnormalities are seen in liver. Surgical clips are seen in gallbladder fossa. There is no significant dilation of bile ducts. Pancreas: There is atrophy.  No focal abnormality is seen. Spleen: Unremarkable. Adrenals/Urinary Tract: Adrenals are unremarkable. There is no hydronephrosis. Arterial calcification is seen in right renal artery branch. There are no renal or ureteral stones. There is 1.7 cm smooth marginated exophytic low-density lesion in the lower pole of left kidney suggesting renal cyst. Ureters are not dilated. Urinary bladder is not distended. Small diverticulum is noted in the right lateral margin of the bladder. Stomach/Bowel: Stomach is unremarkable. Small  bowel loops are not dilated. Appendix is not seen. There is no pericecal inflammation. Multiple diverticula seen in colon without signs of focal acute diverticulitis. Vascular/Lymphatic: Scattered arterial calcifications are seen. Reproductive: Unremarkable. Other: There is no ascites or pneumoperitoneum. Small umbilical hernia containing fat is seen. Small bilateral inguinal hernias containing fat are noted. Musculoskeletal: There is decrease in height of bodies of T12, L1, L4 and L5 vertebrae. There is previous vertebroplasty in the bodies of L1 and L4 vertebrae. The deformity in left inferior pubic ramus suggesting old healed fracture. IMPRESSION: There is no evidence of intestinal obstruction or pneumoperitoneum. There is no hydronephrosis. Diverticulosis of colon without signs of focal acute diverticulitis. Coronary artery calcifications are seen. Increased interstitial markings in the periphery of lower lung fields may suggest scarring from chronic interstitial lung disease. 1.7 cm left renal cyst. Compression fractures are seen in multiple lumbar and lower thoracic  vertebral bodies suggesting possible old compression fractures. Other findings as described in the body of the report. Electronically Signed   By: Ernie Avena M.D.   On: 09/10/2022 14:02    Procedures Procedures   Medications Ordered in ED Medications  cefTRIAXone (ROCEPHIN) 2 g in sodium chloride 0.9 % 100 mL IVPB (has no administration in time range)    And  metroNIDAZOLE (FLAGYL) IVPB 500 mg (has no administration in time range)  lactated ringers bolus 500 mL (0 mLs Intravenous Stopped 09/12/22 0939)  lactated ringers bolus 500 mL (500 mLs Intravenous New Bag/Given 09/12/22 0950)    ED Course/ Medical Decision Making/ A&P                           Medical Decision Making Amount and/or Complexity of Data Reviewed Radiology: ordered.   88 year old male presents to the ED for evaluation.  Please see HPI for further  details.  On examination patient afebrile and nontachycardic.  The patient was also clear bilaterally, he is not hypoxic.  The patient abdomen has tenderness in the left lower quadrant.  The patient is nontoxic in appearance.  Due to initial patient complaint of diarrhea we will proceed with labs to include CBC, CMP, lipase, urinalysis, fecal occult blood testing, respiratory panel.  Patient CBC is elevated with leukocytosis of 13, hemoglobin stable.  The patient CMP has an elevated bilirubin to 2.1, elevated creatinine of 2.01 which is consistent with patient baseline.  The patient lipase is unremarkable.  Patient fecal occult blood testing is negative.  The patient viral respiratory panel is negative for all.  Due to elevated bilirubin and left lower quadrant pain, decision was made to repeat CT scan.  CT scan was initially performed 2 days ago however patient does have tenderness on examination of left lower quadrant which is concerning for diverticulitis.  We will give this patient 500 mL of fluid at a rate of 250 mL/h due to CHF.  After initial fluid bolus, additional fluid bolus was administered.  Patient CT scan shows acute uncomplicated diverticulitis of the left lower quadrant.  The patient will be started on broad-spectrum antibiotics to include Rocephin, metronidazole.  At this time, due to patient age and condition we will consult hospitalist for admission.  Dr. Ronaldo Miyamoto, [facility] hospitalist team, has agreed admit the patient.  We appreciate Dr. Everlean Patterson assistance.  I attempted to alert this patient's POA, his daughter Dondra Spry at the phone number listed in his neuropathic form about the patient being admitted however Dondra Spry did not answer the phone call.    Patient stable at this time.  Final Clinical Impression(s) / ED Diagnoses Final diagnoses:  Diverticulitis of colon  Diarrhea, unspecified type    Rx / DC Orders ED Discharge Orders     None         Al Decant,  PA-C 09/12/22 1017    Gloris Manchester, MD 09/13/22 1305

## 2022-09-13 ENCOUNTER — Other Ambulatory Visit: Payer: Self-pay

## 2022-09-13 DIAGNOSIS — K5792 Diverticulitis of intestine, part unspecified, without perforation or abscess without bleeding: Secondary | ICD-10-CM | POA: Diagnosis not present

## 2022-09-13 LAB — GLUCOSE, CAPILLARY
Glucose-Capillary: 118 mg/dL — ABNORMAL HIGH (ref 70–99)
Glucose-Capillary: 221 mg/dL — ABNORMAL HIGH (ref 70–99)
Glucose-Capillary: 180 mg/dL — ABNORMAL HIGH (ref 70–99)
Glucose-Capillary: 272 mg/dL — ABNORMAL HIGH (ref 70–99)

## 2022-09-13 LAB — COMPREHENSIVE METABOLIC PANEL
ALT: 32 U/L (ref 0–44)
AST: 45 U/L — ABNORMAL HIGH (ref 15–41)
Albumin: 3.1 g/dL — ABNORMAL LOW (ref 3.5–5.0)
Alkaline Phosphatase: 59 U/L (ref 38–126)
Anion gap: 10 (ref 5–15)
BUN: 47 mg/dL — ABNORMAL HIGH (ref 8–23)
CO2: 24 mmol/L (ref 22–32)
Calcium: 9.5 mg/dL (ref 8.9–10.3)
Chloride: 104 mmol/L (ref 98–111)
Creatinine, Ser: 1.89 mg/dL — ABNORMAL HIGH (ref 0.61–1.24)
GFR, Estimated: 32 mL/min — ABNORMAL LOW (ref >60)
Glucose, Bld: 116 mg/dL — ABNORMAL HIGH (ref 70–99)
Potassium: 3.1 mmol/L — ABNORMAL LOW (ref 3.5–5.1)
Sodium: 138 mmol/L (ref 135–145)
Total Bilirubin: 1.8 mg/dL — ABNORMAL HIGH (ref 0.3–1.2)
Total Protein: 5.7 g/dL — ABNORMAL LOW (ref 6.5–8.1)

## 2022-09-13 LAB — GASTROINTESTINAL PANEL BY PCR, STOOL (REPLACES STOOL CULTURE)

## 2022-09-13 LAB — CBC
HCT: 32.8 % — ABNORMAL LOW (ref 39.0–52.0)
Hemoglobin: 10.9 g/dL — ABNORMAL LOW (ref 13.0–17.0)
MCH: 32.1 pg (ref 26.0–34.0)
MCHC: 33.2 g/dL (ref 30.0–36.0)
MCV: 96.5 fL (ref 80.0–100.0)
Platelets: 216 10*3/uL (ref 150–400)
RBC: 3.4 MIL/uL — ABNORMAL LOW (ref 4.22–5.81)
RDW: 13.2 % (ref 11.5–15.5)
WBC: 8.3 10*3/uL (ref 4.0–10.5)
nRBC: 0 % (ref 0.0–0.2)

## 2022-09-13 MED ORDER — SODIUM CHLORIDE 0.9 % IV SOLN: INTRAVENOUS | Status: DC

## 2022-09-13 MED ORDER — ACETAMINOPHEN 325 MG PO TABS
650.0000 mg | ORAL_TABLET | Freq: Four times a day (QID) | ORAL | Status: DC | PRN
  Administered 2022-09-13: 650 mg via ORAL
  Filled 2022-09-13: qty 2

## 2022-09-13 MED ORDER — POTASSIUM CHLORIDE CRYS ER 20 MEQ PO TBCR
40.0000 meq | EXTENDED_RELEASE_TABLET | ORAL | Status: AC
  Administered 2022-09-13 (×2): 40 meq via ORAL
  Filled 2022-09-13 (×2): qty 2

## 2022-09-13 MED ORDER — METRONIDAZOLE 500 MG/100ML IV SOLN
500.0000 mg | Freq: Two times a day (BID) | INTRAVENOUS | Status: DC
  Administered 2022-09-13 – 2022-09-14 (×4): 500 mg via INTRAVENOUS
  Filled 2022-09-13 (×5): qty 100

## 2022-09-13 MED ORDER — INSULIN GLARGINE-YFGN 100 UNIT/ML ~~LOC~~ SOLN
10.0000 [IU] | Freq: Every day | SUBCUTANEOUS | Status: DC
  Administered 2022-09-13 – 2022-09-15 (×3): 10 [IU] via SUBCUTANEOUS
  Filled 2022-09-13 (×3): qty 0.1

## 2022-09-13 NOTE — Progress Notes (Signed)
PROGRESS NOTE    Daniel Cooke  CVE:[id number] DOB: 05-06-1924 DOA: 09/12/2022 PCP: Glenis Smoker, MD    Brief Narrative:   Daniel Cooke is a 88 y.o.  male [employer] veteran with past medical history significant for type 2 diabetes mellitus, persistent atrial fibrillation on Eliquis, CKD stage IIIb, essential hypertension who presented to [facility] ED on 9/30 nausea, vomiting, diarrhea.  Onset since last Saturday and was seen by PCP who started Imodium.  Diarrhea initially cleared up and the Imodium was stopped unfortunately with return.  Daughter also reported that the patient did complain of left lower sided abdominal pain; and the patient was seen again by his PCP on Wednesday with unremarkable CT scan of the abdomen/pelvis but did note an elevated WBC count.  Additionally patient has become increasingly fatigued with poor appetite.  Given persistence/worsening of symptoms, patient was brought to the ED for further evaluation.  In the ED, temperature 97.9 F, HR 73, RR 20, BP 118/43, SPO2 100% on room air.  WBC count 13.0, hemoglobin 11.7, platelets 233.  Sodium 135, potassium 3.7, chloride 100, CO2 22, glucose 249, BUN 54, creatinine 2.01.  Lipase 21.  AST 43, ALT 37, total bilirubin 2.1.  COVID-19 PCR negative.  Influenza A/B PCR negative.  Urinalysis unrevealing.  FOBT negative.  C. difficile negative.  CT abdomen/pelvis with acute diverticulitis to proximal sigmoid colon without signs of perforation/abscess.  Patient was started on empiric antibiotics.  TRH consulted for admission and further evaluation and treatment of acute diverticulitis.  Assessment & Plan:    Acute sigmoid diverticulitis Patient presenting to ED with nausea, vomiting, left lower quadrant abdominal pain with persistent diarrhea for 1 week.  Elevated WBC count of 13.0 and CT abdomen/pelvis with findings of acute diverticulitis of proximal sigmoid colon without signs of perforation/abscess.  C. difficile  PCR negative.  FOBT negative. --WBC 13.0>8.3 --GI PCR panel pending --Ceftriaxone 2g IV q24h --Metronidazole 500 mg IV q12h --NS w/ 20 mEq KCL at 27mL/h --Supportive care, antiemetics --CBC daily  Acute renal failure on CKD stage IIIb Creatinine 2.01 on admission.  --Cr 2.01>1.89 --Avoid nephrotoxins, renal dose all medications --IV fluid hydration as above --BMP daily  Hypokalemia Potassium 3.1, likely secondary to GI loss.  Will replete --Repeat electrolytes in a.m. to include magnesium  Type 2 diabetes mellitus, with hyperglycemia Hemoglobin A1c 8.3, not optimally controlled.  Home regimen includes Lantus 12-16 units subcutaneously nightly, Humalog 2 units with dinner. --Semglee 10u [place] daily --Moderate SSI for coverge --CBGs qAC/HS  Persistent atrial fibrillation --Eliquis 2.5 mg p.o. twice daily  Essential hypertension --Amlodipine 2.5 mg p.o. daily  BPH: Finasteride 5 mg p.o. daily  HLD: Simvastatin 20 mg p.o. daily  Weakness/debility/deconditioning: Patient reports that he lives in a home with a caregiver. --PT/OT evaluation   DVT prophylaxis: apixaban (ELIQUIS) tablet 2.5 mg Start: 09/12/22 1345 apixaban (ELIQUIS) tablet 2.5 mg    Code Status: Full Code Family Communication: Updated patient's daughter [name] via telephone this morning  Disposition Plan:  Level of care: Med-Surg Status is: Inpatient Remains inpatient appropriate because: IV antibiotics, pending PT/OT evaluation, clinical improvement; anticipate discharge home with likely need of home health in 2-3 days    Consultants:  None  Procedures:  None  Antimicrobials:  Ceftriaxone 9/30>> Metronidazole 9/30>>   Subjective: Patient seen examined at bedside, resting comfortably.  Hard of hearing, lying in bed.  No family present.  Nausea improving, no further vomiting.  Has yet to have a  bowel movement this morning.  Appetite slowly improved with decreased abdominal pain.  No other specific  questions or concerns at this time.  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  No other questions or concerns at this time.  Denies headache, no dizziness, no chest pain, no palpitations, no shortness of breath, no fever/chills/night sweats, no vomiting, no current diarrhea, no cough/congestion, no focal weakness, no fatigue, no paresthesias.  No acute events overnight per nursing staff.  Objective: Vitals:   09/12/22 1338 09/12/22 1729 09/12/22 2126 09/13/22 0145  BP: 118/72 (!) 112/53 108/60 (!) 127/50  Pulse: 73 65 71 70  Resp: 17 18 16 16   Temp: (!) 97.5 F (36.4 C) 98.9 F (37.2 C) 99 F (37.2 C) 98.2 F (36.8 C)  TempSrc: Oral Oral Oral Oral  SpO2: 96% 96% 99% 96%    Intake/Output Summary (Last 24 hours) at 09/13/2022 1248 Last data filed at 09/13/2022 1200 Gross per 24 hour  Intake 750.47 ml  Output 100 ml  Net 650.47 ml   There were no vitals filed for this visit.  Examination:  Physical Exam: GEN: NAD, alert and oriented x 3, elderly in appearance HEENT: NCAT, PERRL, EOMI, sclera clear, MMM PULM: CTAB w/o wheezes/crackles, normal respiratory effort, on room air CV: RRR w/o M/G/R GI: abd soft, nondistended, mild TTP LLQ, NABS, no R/G/M MSK: no peripheral edema, moves all extremities independently NEURO: CN II-XII intact, no focal deficits, sensation to light touch intact PSYCH: normal mood/affect Integumentary: dry/intact, no rashes or wounds    Data Reviewed: I have personally reviewed following labs and imaging studies  CBC: Recent Labs  Lab 09/12/22 0540 09/13/22 0743  WBC 13.0* 8.3  NEUTROABS 11.2*  --   HGB 11.7* 10.9*  HCT 34.4* 32.8*  MCV 94.5 96.5  PLT 233 [id number]   Basic Metabolic Panel: Recent Labs  Lab 09/12/22 0540 09/13/22 0743  NA 135 138  K 3.7 3.1*  CL 100 104  CO2 22 24  GLUCOSE 249* 116*  BUN 54* 47*  CREATININE 2.01* 1.89*  CALCIUM 10.1 9.5   GFR: CrCl cannot be calculated (Unknown ideal  weight.). Liver Function Tests: Recent Labs  Lab 09/12/22 0540 09/13/22 0743  AST 43* 45*  ALT 37 32  ALKPHOS 71 59  BILITOT 2.1* 1.8*  PROT 6.6 5.7*  ALBUMIN 3.8 3.1*   Recent Labs  Lab 09/12/22 0540  LIPASE 21   No results for input(s): "AMMONIA" in the last 168 hours. Coagulation Profile: No results for input(s): "INR", "PROTIME" in the last 168 hours. Cardiac Enzymes: No results for input(s): "CKTOTAL", "CKMB", "CKMBINDEX", "TROPONINI" in the last 168 hours. BNP (last 3 results) No results for input(s): "PROBNP" in the last 8760 hours. HbA1C: Recent Labs    09/12/22 1345  HGBA1C 8.3*   CBG: Recent Labs  Lab 09/12/22 1649 09/12/22 2123 09/13/22 0716 09/13/22 1122  GLUCAP 211* 94 118* 221*   Lipid Profile: No results for input(s): "CHOL", "HDL", "LDLCALC", "TRIG", "CHOLHDL", "LDLDIRECT" in the last 72 hours. Thyroid Function Tests: No results for input(s): "TSH", "T4TOTAL", "FREET4", "T3FREE", "THYROIDAB" in the last 72 hours. Anemia Panel: No results for input(s): "VITAMINB12", "FOLATE", "FERRITIN", "TIBC", "IRON", "RETICCTPCT" in the last 72 hours. Sepsis Labs: No results for input(s): "PROCALCITON", "LATICACIDVEN" in the last 168 hours.  Recent Results (from the past 240 hour(s))  Resp Panel by RT-PCR (Flu A&B, Covid) Anterior Nasal Swab     Status: None   Collection  Time: 09/12/22  7:05 AM   Specimen: Anterior Nasal Swab  Result Value Ref Range Status   SARS Coronavirus 2 by RT PCR NEGATIVE NEGATIVE Final    Comment: (NOTE) SARS-CoV-2 target nucleic acids are NOT DETECTED.  The SARS-CoV-2 RNA is generally detectable in upper respiratory specimens during the acute phase of infection. The lowest concentration of SARS-CoV-2 viral copies this assay can detect is 138 copies/mL. A negative result does not preclude SARS-Cov-2 infection and should not be used as the sole basis for treatment or other patient management decisions. A negative result may occur  with  improper specimen collection/handling, submission of specimen other than nasopharyngeal swab, presence of viral mutation(s) within the areas targeted by this assay, and inadequate number of viral copies(<138 copies/mL). A negative result must be combined with clinical observations, patient history, and epidemiological information. The expected result is Negative.  Fact Sheet for Patients:  EntrepreneurPulse.com.au  Fact Sheet for Healthcare Providers:  IncredibleEmployment.be  This test is no t yet approved or cleared by the [place] FDA and  has been authorized for detection and/or diagnosis of SARS-CoV-2 by FDA under an Emergency Use Authorization (EUA). This EUA will remain  in effect (meaning this test can be used) for the duration of the COVID-19 declaration under Section 564(b)(1) of the Act, 21 U.S.C.section 360bbb-3(b)(1), unless the authorization is terminated  or revoked sooner.       Influenza A by PCR NEGATIVE NEGATIVE Final   Influenza B by PCR NEGATIVE NEGATIVE Final    Comment: (NOTE) The Xpert Xpress SARS-CoV-2/FLU/RSV plus assay is intended as an aid in the diagnosis of influenza from Nasopharyngeal swab specimens and should not be used as a sole basis for treatment. Nasal washings and aspirates are unacceptable for Xpert Xpress SARS-CoV-2/FLU/RSV testing.  Fact Sheet for Patients: EntrepreneurPulse.com.au  Fact Sheet for Healthcare Providers: IncredibleEmployment.be  This test is not yet approved or cleared by the [place] FDA and has been authorized for detection and/or diagnosis of SARS-CoV-2 by FDA under an Emergency Use Authorization (EUA). This EUA will remain in effect (meaning this test can be used) for the duration of the COVID-19 declaration under Section 564(b)(1) of the Act, 21 U.S.C. section 360bbb-3(b)(1), unless the authorization is terminated  or revoked.  Performed at [facility], [place] [address], [place], [place] [address]   C Difficile Quick Screen w PCR reflex     Status: None   Collection Time: 09/12/22  8:14 AM   Specimen: STOOL  Result Value Ref Range Status   C Diff antigen NEGATIVE NEGATIVE Final   C Diff toxin NEGATIVE NEGATIVE Final   C Diff interpretation No C. difficile detected.  Final    Comment: Performed at [facility], [place] [address] [place], [place] [address]         Radiology Studies: CT ABDOMEN PELVIS WO CONTRAST  Result Date: 09/12/2022 CLINICAL DATA:  Right upper quadrant rigidity.  Diarrhea. EXAM: CT ABDOMEN AND PELVIS WITHOUT CONTRAST TECHNIQUE: Multidetector CT imaging of the abdomen and pelvis was performed following the standard protocol without IV contrast. RADIATION DOSE REDUCTION: This exam was performed according to the departmental dose-optimization program which includes automated exposure control, adjustment of the mA and/or kV according to patient size and/or use of iterative reconstruction technique. COMPARISON:  09/10/2022 FINDINGS: Lower chest: No acute abnormality. Chronic interstitial reticulation noted within the lung bases. Hepatobiliary: No focal liver abnormality. Status post cholecystectomy. No signs of biliary dilatation. Pancreas: Unremarkable. No pancreatic  ductal dilatation or surrounding inflammatory changes. Spleen: Normal in size without focal abnormality. Adrenals/Urinary Tract: Normal adrenal glands. Bilateral renal cortical thinning is identified. Bosniak class 1 cyst arises off the inferior pole of the left kidney measuring 1.4 cm, image 35/2. No follow-up imaging recommended. Urinary bladder is unremarkable. Stomach/Bowel: Stomach appears nondistended. Status post appendectomy. No small bowel wall thickening, inflammation, or distension. Sigmoid diverticulosis identified. There is mild wall thickening with surrounding fat stranding  involving the proximal segment of the sigmoid colon which is concerning for uncomplicated acute diverticulitis. No signs of perforation or abscess. Vascular/Lymphatic: Aortic atherosclerosis. No abdominopelvic adenopathy. Reproductive: Prostate is unremarkable. Other: There is no free fluid or fluid collections. No signs of pneumoperitoneum. Musculoskeletal: Healed left inferior pubic rami fracture. Spondylosis identified within the thoracolumbar spine. Treated compression deformities are identified at L1 and L4. Stable untreated compression fractures noted at T12 and L5. IMPRESSION: 1. Suspect mild, uncomplicated acute diverticulitis of the proximal segment of the sigmoid colon. No signs of perforation or abscess. 2. Chronic interstitial reticulation noted within the lung bases. 3. Stable untreated compression fractures at T12 and L5. Treated compression deformities are identified at L1 and L4. 4. Aortic Atherosclerosis (ICD10-I70.0). Electronically Signed   By: Kerby Moors M.D.   On: 09/12/2022 09:16        Scheduled Meds:  amLODipine  2.5 mg Oral Daily   apixaban  2.5 mg Oral BID   finasteride  5 mg Oral Daily   insulin aspart  0-15 Units Subcutaneous TID WC   insulin aspart  0-5 Units Subcutaneous QHS   mirabegron ER  50 mg Oral QHS   potassium chloride  40 mEq Oral Q3H   simvastatin  20 mg Oral QHS   traZODone  50 mg Oral QHS   Continuous Infusions:  cefTRIAXone (ROCEPHIN)  IV Stopped (09/13/22 1143)   metronidazole Stopped (09/13/22 1031)     LOS: 1 day    Time spent: 52 minutes spent on chart review, discussion with nursing staff, consultants, updating family and interview/physical exam; more than 50% of that time was spent in counseling and/or coordination of care.    Demario Faniel J [place] DO [facility] Available via Epic secure chat 7am-7pm After these hours, please refer to coverage provider listed on [email or web address] 09/13/2022, 12:48 PM

## 2022-09-14 DIAGNOSIS — K5792 Diverticulitis of intestine, part unspecified, without perforation or abscess without bleeding: Secondary | ICD-10-CM | POA: Diagnosis not present

## 2022-09-14 LAB — BASIC METABOLIC PANEL
Anion gap: 8 (ref 5–15)
BUN: 44 mg/dL — ABNORMAL HIGH (ref 8–23)
CO2: 20 mmol/L — ABNORMAL LOW (ref 22–32)
Calcium: 8.9 mg/dL (ref 8.9–10.3)
Chloride: 107 mmol/L (ref 98–111)
Creatinine, Ser: 1.8 mg/dL — ABNORMAL HIGH (ref 0.61–1.24)
GFR, Estimated: 34 mL/min — ABNORMAL LOW (ref >60)
Glucose, Bld: 166 mg/dL — ABNORMAL HIGH (ref 70–99)
Potassium: 3.9 mmol/L (ref 3.5–5.1)
Sodium: 135 mmol/L (ref 135–145)

## 2022-09-14 LAB — GLUCOSE, CAPILLARY
Glucose-Capillary: 154 mg/dL — ABNORMAL HIGH (ref 70–99)
Glucose-Capillary: 231 mg/dL — ABNORMAL HIGH (ref 70–99)
Glucose-Capillary: 179 mg/dL — ABNORMAL HIGH (ref 70–99)

## 2022-09-14 LAB — CBC
HCT: 30.2 % — ABNORMAL LOW (ref 39.0–52.0)
Hemoglobin: 9.8 g/dL — ABNORMAL LOW (ref 13.0–17.0)
MCH: 32.2 pg (ref 26.0–34.0)
MCHC: 32.5 g/dL (ref 30.0–36.0)
MCV: 99.3 fL (ref 80.0–100.0)
Platelets: 199 10*3/uL (ref 150–400)
RBC: 3.04 MIL/uL — ABNORMAL LOW (ref 4.22–5.81)
RDW: 13.2 % (ref 11.5–15.5)
WBC: 8.3 10*3/uL (ref 4.0–10.5)
nRBC: 0 % (ref 0.0–0.2)

## 2022-09-14 LAB — MAGNESIUM: Magnesium: 2.1 mg/dL (ref 1.7–2.4)

## 2022-09-14 MED ORDER — INSULIN ASPART 100 UNIT/ML IJ SOLN
2.0000 [IU] | Freq: Three times a day (TID) | INTRAMUSCULAR | Status: DC
  Administered 2022-09-14 – 2022-09-15 (×2): 2 [IU] via SUBCUTANEOUS

## 2022-09-14 NOTE — TOC Initial Note (Signed)
Transition of Care [facility]) - Initial/Assessment Note    Patient Details  Name: Daniel Cooke MRN: [id number] Date of Birth: 02-Jun-1924  [facility]) CM/SW Contact:    Vassie Moselle, LCSW Phone Number: 09/14/2022, 1:35 PM  Clinical Narrative:                 Spoke with pt's daughter, Daniel Cooke over the phone. Pt's daughter reports that pt has a 24/7 live in caregiver. CSW discussed recommendation for home health PT. Pt's daughter is agreeable to additional home health services being arranged for her father for PT/OT. HHPT/OT has been arranged with [facility]. HH orders will need to be placed prior to discharge. Pt's daughter reports that pt has several walkers at home and declines need for additional walker being ordered. She denies any additional DME needs at this time.   Expected Discharge Plan: [place] Barriers to Discharge: No Barriers Identified   Patient Goals and CMS Choice Patient states their goals for this hospitalization and ongoing recovery are:: To return home CMS Medicare.gov Compare Post Acute Care list provided to:: Patient Represenative (must comment) (Daughter, Daniel Cooke) Choice offered to / list presented to : [facility] POA / Guardian  Expected Discharge Plan and Services Expected Discharge Plan: [place] In-house Referral: NA Discharge Planning Services: NA Post Acute Care Choice: Home Health, Resumption of Svcs/PTA Provider Living arrangements for the past 2 months: Single Family Home                 DME Arranged: N/A DME Agency: NA       HH Arranged: PT, OT HH Agency: [place] Date [facility] Agency Contacted: 09/14/22 Time HH Agency Contacted: 69 Representative spoke with at [place]: Georgina Snell  Prior Living Arrangements/Services Living arrangements for the past 2 months: [place] with:: Other (Comment) (24/7 Caregiver) Patient language and need for interpreter reviewed:: Yes Do  you feel safe going back to the place where you live?: Yes      Need for Family Participation in Patient Care: Yes (Comment) Care giver support system in place?: Yes (comment) Current home services: Other (comment), DME (24/7 Caregiver) Criminal Activity/Legal Involvement Pertinent to Current Situation/Hospitalization: No - Comment as needed  Activities of Daily Living Home Assistive Devices/Equipment: None ADL Screening (condition at time of admission) Patient's cognitive ability adequate to safely complete daily activities?: Yes Is the patient deaf or have difficulty hearing?: Yes Does the patient have difficulty seeing, even when wearing glasses/contacts?: Yes Does the patient have difficulty concentrating, remembering, or making decisions?: No Patient able to express need for assistance with ADLs?: Yes Does the patient have difficulty dressing or bathing?: Yes Independently performs ADLs?: No Communication: Needs assistance Is this a change from baseline?: Pre-admission baseline Dressing (OT): Needs assistance Is this a change from baseline?: Pre-admission baseline Grooming: Needs assistance Is this a change from baseline?: Pre-admission baseline Feeding: Appropriate for developmental age Bathing: Needs assistance Is this a change from baseline?: Pre-admission baseline Toileting: Needs assistance Is this a change from baseline?: Pre-admission baseline In/Out Bed: Needs assistance Is this a change from baseline?: Pre-admission baseline Walks in Home: Needs assistance Is this a change from baseline?: Pre-admission baseline Does the patient have difficulty walking or climbing stairs?: No Weakness of Legs: None Weakness of Arms/Hands: None  Permission Sought/Granted Permission sought to share information with : Case Manager, Family Supports Permission granted to share information with : No  Share  Information with NAME: Daniel Cooke     Permission granted to share info w  Relationship: Daughter  Permission granted to share info w Contact Information: [phone]  Emotional Assessment Appearance:: Other (Comment Required (Unable to assess) Attitude/Demeanor/Rapport: Unable to Assess Affect (typically observed): Unable to Assess Orientation: : Oriented to Self Alcohol / Substance Use: Not Applicable Psych Involvement: No (comment)  Admission diagnosis:  Diverticulitis [K57.92] Diverticulitis of colon [K57.32] Diarrhea, unspecified type [R19.7] Patient Active Problem List   Diagnosis Date Noted   Diverticulitis 09/12/2022   Acute respiratory disease due to COVID-19 virus 05/20/2021   Acute renal failure superimposed on stage 3b chronic kidney disease ([place]) 35/00/9381   Complicated urinary tract infection 11/24/2019   Back pain 11/24/2019   Generalized weakness 11/24/2019   Persistent atrial fibrillation ([place]) 11/15/2019   Acquired thrombophilia ([place]) 11/15/2019   Sensorineural hearing loss (SNHL) of both ears 11/08/2018   Pain in left knee 07/28/2018   Type II diabetes mellitus ([place])    Shingles    Pneumonia    Pilonidal cyst    AF (paroxysmal atrial fibrillation) (HCC)    Osteoarthritis    Hypercholesterolemia    Hard of hearing    Essential hypertension    Dysrhythmia    Chronic diastolic CHF (congestive heart failure) (HCC)    Arthritis    Compression fracture of fourth lumbar vertebra ([place]) 05/28/2017   Acute on chronic diastolic CHF (congestive heart failure) ([place]) 11/27/2016   Chronic kidney disease, stage 3b ([place]) 11/25/2016   Anemia of chronic disease 11/25/2016   HTN (hypertension) 11/25/2016   HLD (hyperlipidemia) 11/25/2016   Type 2 diabetes mellitus with stage 3 chronic kidney disease, without long-term current use of insulin ([place]) 11/25/2016   Bilateral impacted cerumen 09/15/2016   Encounter for therapeutic drug monitoring 03/20/2016   Synovitis of knee 02/27/2014   OA (osteoarthritis) of knee 02/08/2012   PCP:   Glenis Smoker, MD Pharmacy:   [place] ([place]), [place] - [address] [place] [address] Phone: [phone] Fax: [phone]  [id number] [address] [place] [address] Phone: [phone] Fax: [phone]     Social Determinants of Health (SDOH) Interventions Housing Interventions: Patient Refused  Readmission Risk Interventions    09/14/2022    1:30 PM  Readmission Risk Prevention Plan  Post Dischage Appt Complete  Medication Screening Complete  Transportation Screening Complete

## 2022-09-14 NOTE — Progress Notes (Signed)
Inpatient Diabetes Program Recommendations  AACE/ADA: New Consensus Statement on Inpatient Glycemic Control (2015)  Target Ranges:  Prepandial:   less than 140 mg/dL      Peak postprandial:   less than 180 mg/dL (1-2 hours)      Critically ill patients:  140 - 180 mg/dL   Lab Results  Component Value Date   GLUCAP 231 (H) 09/14/2022   HGBA1C 8.3 (H) 09/12/2022    Review of Glycemic Control  Latest Reference Range & Units 09/13/22 16:09 09/13/22 20:58 09/14/22 07:54 09/14/22 10:57  Glucose-Capillary 70 - 99 mg/dL 180 (H) 272 (H) 154 (H) 231 (H)   Diabetes history: DM 2 Outpatient Diabetes medications:  Lantus 12 -16 units q HS Humalog 2 units with supper Current orders for Inpatient glycemic control:  Novolog 0-15 units tid with meals and HS Semglee 10 units daily Inpatient Diabetes Program Recommendations:   Consider adding Novolog 2 unit tid with meals to cover CHO intake- hold if patient eats less than 50% or NPO.   Thanks,  Adah Perl, RN, BC-ADM Inpatient Diabetes Coordinator Pager [phone]  (8a-5p)

## 2022-09-14 NOTE — Evaluation (Signed)
Occupational Therapy Evaluation Patient Details Name: Daniel Cooke MRN: [id number] DOB: 05-Dec-1924 Today's Date: 09/14/2022   History of Present Illness Patient is 88 y.o. male who presented to [facility] ED on 9/30 nausea, vomiting, diarrhea. PMH significant for type 2 diabetes mellitus, persistent atrial fibrillation on Eliquis, CKD stage IIIb, essential hypertension. CT abdomen/pelvis with acute diverticulitis to proximal sigmoid colon without signs of perforation/abscess.  Patient was started on empiric antibiotics.   Clinical Impression   Pt admitted with the above diagnosis and has the deficits listed below. Pt would benefit from cont OT to increase independence in basic adls and adl transfers back to his baseline level of functioning. Pt lives with his 24/7 caregivers and has all the help he needs with adls and Iadls but would like to stay as independent as possible. Pt is very HOH and therefore need to assess cognition a bit closer but feel he is safe at the level he is at with a 24 hour caregiver.  Pt may benefit from [facility] to reach baseline although feel he is close.       Recommendations for follow up therapy are one component of a multi-disciplinary discharge planning process, led by the attending physician.  Recommendations may be updated based on patient status, additional functional criteria and insurance authorization.   Follow Up Recommendations  Home health OT    Assistance Recommended at Discharge Frequent or constant Supervision/Assistance  Patient can return home with the following A little help with walking and/or transfers;A little help with bathing/dressing/bathroom;Assistance with cooking/housework;Direct supervision/assist for medications management;Direct supervision/assist for financial management;Assist for transportation;Help with stairs or ramp for entrance    Functional Status Assessment  Patient has had a recent decline in their functional status and demonstrates  the ability to make significant improvements in function in a reasonable and predictable amount of time.  Equipment Recommendations  None recommended by OT    Recommendations for Other Services       Precautions / Restrictions Precautions Precautions: Fall Restrictions Weight Bearing Restrictions: No      Mobility Bed Mobility Overal bed mobility: Needs Assistance Bed Mobility: Supine to Sit     Supine to sit: Min assist, HOB elevated     General bed mobility comments: Multimodal cues for sequencing task. Assist to bring feet off EOB fully and to raise trunk. Pt c/o slight dizziness, BP/HR stable sitting EOB.    Transfers Overall transfer level: Needs assistance Equipment used: Rolling walker (2 wheels) Transfers: Sit to/from Stand Sit to Stand: Min assist           General transfer comment: Cues for technique with RW, min assist to initiate power up and steady rise to walker.      Balance Overall balance assessment: Needs assistance Sitting-balance support: Feet supported Sitting balance-Leahy Scale: Good     Standing balance support: During functional activity, Reliant on assistive device for balance, Bilateral upper extremity supported Standing balance-Leahy Scale: Fair Standing balance comment: Pt must have walker. Did become dizzy on one occasion and after sitting recovered.                           ADL either performed or assessed with clinical judgement   ADL Overall ADL's : Needs assistance/impaired Eating/Feeding: Independent;Sitting   Grooming: Wash/dry hands;Wash/dry face;Oral care;Supervision/safety;Sitting   Upper Body Bathing: Sitting;Minimal assistance   Lower Body Bathing: Moderate assistance;Sit to/from stand;Cueing for compensatory techniques   Upper Body Dressing : Minimal assistance;Sitting  Lower Body Dressing: Moderate assistance;Cueing for compensatory techniques;Sit to/from stand   Toilet Transfer: Minimal  assistance;BSC/3in1;Stand-pivot   Toileting- Clothing Manipulation and Hygiene: Minimal assistance;Sit to/from stand;Cueing for compensatory techniques       Functional mobility during ADLs: Minimal assistance;Rolling walker (2 wheels);Caregiver able to provide necessary level of assistance General ADL Comments: Pt limited due to having difficulty accessing LEs and decreased balance in standing.     Vision Baseline Vision/History: 0 No visual deficits Ability to See in Adequate Light: 0 Adequate Patient Visual Report: No change from baseline Vision Assessment?: No apparent visual deficits     Perception     Praxis      Pertinent Vitals/Pain Pain Assessment Pain Assessment: No/denies pain     Hand Dominance Right   Extremity/Trunk Assessment Upper Extremity Assessment Upper Extremity Assessment: Overall WFL for tasks assessed   Lower Extremity Assessment Lower Extremity Assessment: Defer to PT evaluation   Cervical / Trunk Assessment Cervical / Trunk Assessment: Kyphotic   Communication Communication Communication: HOH   Cognition Arousal/Alertness: Awake/alert Behavior During Therapy: WFL for tasks assessed/performed Overall Cognitive Status: Difficult to assess                                 General Comments: Pt oriented to self, situation and date. pt thought he was in a hospital in [place]. Difficult to fully assess secondary to being very [facility]     General Comments  Pt most limited by decreased balance, mild dizziness and fatigue.    Exercises     Shoulder Instructions      Home Living Family/patient expects to be discharged to:: Private residence Living Arrangements: Children Available Help at Discharge: Personal care attendant;Family;Available 24 hours/day Type of Home: House Home Access: Stairs to enter [employer] of Steps: 5 Entrance Stairs-Rails: Can reach both Home Layout: One level     Bathroom Shower/Tub:  Producer, television/film/video: Handicapped height Bathroom Accessibility: Yes   Home Equipment: Agricultural consultant (2 wheels);Cane - single point;Wheelchair - manual;BSC/3in1;Shower seat   Additional Comments: "nelly" is his 24/7 caregiver to assist with him. - pt has assist for household mobility and all ADL's.      Prior Functioning/Environment Prior Level of Function : Needs assist       Physical Assist : Mobility (physical);ADLs (physical) Mobility (physical): Gait;Stairs ADLs (physical): Bathing;Dressing;Toileting;IADLs Mobility Comments: walks with walker. has several in the home ADLs Comments: caregiver assists with whatever he needs by  mostly cooking, cleaning, bathing, dressing.        OT Problem List: Decreased activity tolerance;Impaired balance (sitting and/or standing);Decreased knowledge of use of DME or AE;Decreased cognition;Decreased safety awareness      OT Treatment/Interventions: Self-care/ADL training;Therapeutic activities;Balance training    OT Goals(Current goals can be found in the care plan section) Acute Rehab OT Goals Patient Stated Goal: to hopefully get home soon OT Goal Formulation: With patient Time For Goal Achievement: 09/28/22 Potential to Achieve Goals: Fair ADL Goals Pt Will Perform Grooming: with supervision;standing Additional ADL Goal #1: Pt will walk to bathroom with walker and complete toileting with min assist. Additional ADL Goal #2: Pt will dress self with min assist (baseline).  OT Frequency: Min 2X/week    Co-evaluation              AM-PAC OT "6 Clicks" Daily Activity     Outcome Measure Help from another person eating meals?: None Help  from another person taking care of personal grooming?: A Little Help from another person toileting, which includes using toliet, bedpan, or urinal?: A Little Help from another person bathing (including washing, rinsing, drying)?: A Little Help from another person to put on and taking  off regular upper body clothing?: A Little Help from another person to put on and taking off regular lower body clothing?: A Little 6 Click Score: 19   End of Session Equipment Utilized During Treatment: Rolling walker (2 wheels) Nurse Communication: Mobility status  Activity Tolerance: Patient limited by fatigue Patient left: in bed;with bed alarm set;with call bell/phone within reach  OT Visit Diagnosis: Unsteadiness on feet (R26.81)                Time: [id number] OT Time Calculation (min): 20 min Charges:  OT General Charges $OT Visit: 1 Visit OT Evaluation $OT Eval Low Complexity: 1 Low  Glenford Peers 09/14/2022, 2:02 PM

## 2022-09-14 NOTE — Progress Notes (Signed)
PROGRESS NOTE    Daniel Cooke  [id number] DOB: May 06, 1924 DOA: 09/12/2022 PCP: Glenis Smoker, MD    Brief Narrative:   Daniel Cooke is a 88 y.o.  male [employer] veteran with past medical history significant for type 2 diabetes mellitus, persistent atrial fibrillation on Eliquis, CKD stage IIIb, essential hypertension who presented to [facility] ED on 9/30 nausea, vomiting, diarrhea.  Onset since last Saturday and was seen by PCP who started Imodium.  Diarrhea initially cleared up and the Imodium was stopped unfortunately with return.  Daughter also reported that the patient did complain of left lower sided abdominal pain; and the patient was seen again by his PCP on Wednesday with unremarkable CT scan of the abdomen/pelvis but did note an elevated WBC count.  Additionally patient has become increasingly fatigued with poor appetite.  Given persistence/worsening of symptoms, patient was brought to the ED for further evaluation.  In the ED, temperature 97.9 F, HR 73, RR 20, BP 118/43, SPO2 100% on room air.  WBC count 13.0, hemoglobin 11.7, platelets 233.  Sodium 135, potassium 3.7, chloride 100, CO2 22, glucose 249, BUN 54, creatinine 2.01.  Lipase 21.  AST 43, ALT 37, total bilirubin 2.1.  COVID-19 PCR negative.  Influenza A/B PCR negative.  Urinalysis unrevealing.  FOBT negative.  C. difficile negative.  CT abdomen/pelvis with acute diverticulitis to proximal sigmoid colon without signs of perforation/abscess.  Patient was started on empiric antibiotics.  TRH consulted for admission and further evaluation and treatment of acute diverticulitis.  Assessment & Plan:    Acute sigmoid diverticulitis Patient presenting to ED with nausea, vomiting, left lower quadrant abdominal pain with persistent diarrhea for 1 week.  Elevated WBC count of 13.0 and CT abdomen/pelvis with findings of acute diverticulitis of proximal sigmoid colon without signs of perforation/abscess.  C. difficile/GI  PCR negative.  FOBT negative. --WBC 13.0>8.3 --Ceftriaxone 2g IV q24h --Metronidazole 500 mg IV q12h --Supportive care, antiemetics --CBC daily  Acute renal failure on CKD stage IIIb Creatinine 2.01 on admission.  --Cr 2.01>1.89>1.80 --Avoid nephrotoxins, renal dose all medications --BMP daily  Hypokalemia Etiology likely secondary to GI loss.  Repleted, potassium 3.9 this morning --Repeat electrolytes in a.m. to include magnesium  Type 2 diabetes mellitus, with hyperglycemia Hemoglobin A1c 8.3, not optimally controlled.  Home regimen includes Lantus 12-16 units subcutaneously nightly, Humalog 2 units with dinner. --Semglee 10u [place] daily --NovoLog 2u [place] TIDAC --Moderate SSI for coverge --CBGs qAC/HS  Persistent atrial fibrillation --Eliquis 2.5 mg p.o. twice daily  Essential hypertension --Amlodipine 2.5 mg p.o. daily  BPH: Finasteride 5 mg p.o. daily  HLD: Simvastatin 20 mg p.o. daily  Weakness/debility/deconditioning: Patient reports that he lives in a home with a caregiver. --PT recommending home health, awaiting OT evaluation   DVT prophylaxis: apixaban (ELIQUIS) tablet 2.5 mg Start: 09/12/22 1345 apixaban (ELIQUIS) tablet 2.5 mg    Code Status: Full Code Family Communication: Updated patient's daughter [name] at bedside this morning  Disposition Plan:  Level of care: Med-Surg Status is: Inpatient Remains inpatient appropriate because: IV antibiotics, pending OT evaluation, clinical improvement; anticipate discharge home with likely need of home health likely tomorrow    Consultants:  None  Procedures:  None  Antimicrobials:  Ceftriaxone 9/30>> Metronidazole 9/30>>   Subjective: Patient seen examined at bedside, resting comfortably.  Sitting in bedside chair, daughter present.  No specific complaints this morning.  Denies headache, no fever/chills/night sweats, no nausea/vomiting/diarrhea, no chest pain, no palpitations, no shortness of  breath, no  abdominal pain, no focal weakness, no fatigue, no paresthesias.  No acute events overnight per nursing staff.    Objective: Vitals:   09/13/22 1355 09/13/22 2056 09/14/22 0425 09/14/22 1302  BP: 128/74 (!) 110/53 (!) 134/56 (!) 134/54  Pulse: (!) 56 64 70 71  Resp: (!) 22 17 17 20   Temp: 97.8 F (36.6 C) 98.6 F (37 C) 98.2 F (36.8 C) 98.1 F (36.7 C)  TempSrc: Oral Oral Oral Oral  SpO2: 98% 99% 94% 99%    Intake/Output Summary (Last 24 hours) at 09/14/2022 1341 Last data filed at 09/14/2022 0900 Gross per 24 hour  Intake 1137.28 ml  Output 150 ml  Net 987.28 ml   There were no vitals filed for this visit.  Examination:  Physical Exam: GEN: NAD, alert and oriented x 3, elderly in appearance HEENT: NCAT, PERRL, EOMI, sclera clear, MMM PULM: CTAB w/o wheezes/crackles, normal respiratory effort, on room air CV: RRR w/o M/G/R GI: abd soft, nondistended, NTTP, NABS, no R/G/M MSK: no peripheral edema, moves all extremities independently NEURO: CN II-XII intact, no focal deficits, sensation to light touch intact PSYCH: normal mood/affect Integumentary: dry/intact, no rashes or wounds    Data Reviewed: I have personally reviewed following labs and imaging studies  CBC: Recent Labs  Lab 09/12/22 0540 09/13/22 0743 09/14/22 0457  WBC 13.0* 8.3 8.3  NEUTROABS 11.2*  --   --   HGB 11.7* 10.9* 9.8*  HCT 34.4* 32.8* 30.2*  MCV 94.5 96.5 99.3  PLT 233 216 237   Basic Metabolic Panel: Recent Labs  Lab 09/12/22 0540 09/13/22 0743 09/14/22 0457  NA 135 138 135  K 3.7 3.1* 3.9  CL 100 104 107  CO2 22 24 20*  GLUCOSE 249* 116* 166*  BUN 54* 47* 44*  CREATININE 2.01* 1.89* 1.80*  CALCIUM 10.1 9.5 8.9  MG  --   --  2.1   GFR: CrCl cannot be calculated (Unknown ideal weight.). Liver Function Tests: Recent Labs  Lab 09/12/22 0540 09/13/22 0743  AST 43* 45*  ALT 37 32  ALKPHOS 71 59  BILITOT 2.1* 1.8*  PROT 6.6 5.7*  ALBUMIN 3.8 3.1*   Recent Labs  Lab  09/12/22 0540  LIPASE 21   No results for input(s): "AMMONIA" in the last 168 hours. Coagulation Profile: No results for input(s): "INR", "PROTIME" in the last 168 hours. Cardiac Enzymes: No results for input(s): "CKTOTAL", "CKMB", "CKMBINDEX", "TROPONINI" in the last 168 hours. BNP (last 3 results) No results for input(s): "PROBNP" in the last 8760 hours. HbA1C: Recent Labs    09/12/22 1345  HGBA1C 8.3*   CBG: Recent Labs  Lab 09/13/22 1122 09/13/22 1609 09/13/22 2058 09/14/22 0754 09/14/22 1057  GLUCAP 221* 180* 272* 154* 231*   Lipid Profile: No results for input(s): "CHOL", "HDL", "LDLCALC", "TRIG", "CHOLHDL", "LDLDIRECT" in the last 72 hours. Thyroid Function Tests: No results for input(s): "TSH", "T4TOTAL", "FREET4", "T3FREE", "THYROIDAB" in the last 72 hours. Anemia Panel: No results for input(s): "VITAMINB12", "FOLATE", "FERRITIN", "TIBC", "IRON", "RETICCTPCT" in the last 72 hours. Sepsis Labs: No results for input(s): "PROCALCITON", "LATICACIDVEN" in the last 168 hours.  Recent Results (from the past 240 hour(s))  Resp Panel by RT-PCR (Flu A&B, Covid) Anterior Nasal Swab     Status: None   Collection Time: 09/12/22  7:05 AM   Specimen: Anterior Nasal Swab  Result Value Ref Range Status   SARS Coronavirus 2 by RT PCR NEGATIVE NEGATIVE Final    Comment: (  NOTE) SARS-CoV-2 target nucleic acids are NOT DETECTED.  The SARS-CoV-2 RNA is generally detectable in upper respiratory specimens during the acute phase of infection. The lowest concentration of SARS-CoV-2 viral copies this assay can detect is 138 copies/mL. A negative result does not preclude SARS-Cov-2 infection and should not be used as the sole basis for treatment or other patient management decisions. A negative result may occur with  improper specimen collection/handling, submission of specimen other than nasopharyngeal swab, presence of viral mutation(s) within the areas targeted by this assay, and  inadequate number of viral copies(<138 copies/mL). A negative result must be combined with clinical observations, patient history, and epidemiological information. The expected result is Negative.  Fact Sheet for Patients:  BloggerCourse.com  Fact Sheet for Healthcare Providers:  SeriousBroker.it  This test is no t yet approved or cleared by the [place] FDA and  has been authorized for detection and/or diagnosis of SARS-CoV-2 by FDA under an Emergency Use Authorization (EUA). This EUA will remain  in effect (meaning this test can be used) for the duration of the COVID-19 declaration under Section 564(b)(1) of the Act, 21 U.S.C.section 360bbb-3(b)(1), unless the authorization is terminated  or revoked sooner.       Influenza A by PCR NEGATIVE NEGATIVE Final   Influenza B by PCR NEGATIVE NEGATIVE Final    Comment: (NOTE) The Xpert Xpress SARS-CoV-2/FLU/RSV plus assay is intended as an aid in the diagnosis of influenza from Nasopharyngeal swab specimens and should not be used as a sole basis for treatment. Nasal washings and aspirates are unacceptable for Xpert Xpress SARS-CoV-2/FLU/RSV testing.  Fact Sheet for Patients: BloggerCourse.com  Fact Sheet for Healthcare Providers: SeriousBroker.it  This test is not yet approved or cleared by the [place] FDA and has been authorized for detection and/or diagnosis of SARS-CoV-2 by FDA under an Emergency Use Authorization (EUA). This EUA will remain in effect (meaning this test can be used) for the duration of the COVID-19 declaration under Section 564(b)(1) of the Act, 21 U.S.C. section 360bbb-3(b)(1), unless the authorization is terminated or revoked.  Performed at [facility], [address] [place], [place] [address]   C Difficile Quick Screen w PCR reflex     Status: None   Collection Time:  09/12/22  8:14 AM   Specimen: STOOL  Result Value Ref Range Status   C Diff antigen NEGATIVE NEGATIVE Final   C Diff toxin NEGATIVE NEGATIVE Final   C Diff interpretation No C. difficile detected.  Final    Comment: Performed at [facility], [address] [place], [place] [address]  Gastrointestinal Panel by PCR , Stool     Status: None   Collection Time: 09/12/22  9:23 AM   Specimen: STOOL  Result Value Ref Range Status   Campylobacter species NOT DETECTED NOT DETECTED Final   Plesimonas shigelloides NOT DETECTED NOT DETECTED Final   Salmonella species NOT DETECTED NOT DETECTED Final   Yersinia enterocolitica NOT DETECTED NOT DETECTED Final   Vibrio species NOT DETECTED NOT DETECTED Final   Vibrio cholerae NOT DETECTED NOT DETECTED Final   Enteroaggregative E coli (EAEC) NOT DETECTED NOT DETECTED Final   Enteropathogenic E coli (EPEC) NOT DETECTED NOT DETECTED Final   Enterotoxigenic E coli (ETEC) NOT DETECTED NOT DETECTED Final   Shiga like toxin producing E coli (STEC) NOT DETECTED NOT DETECTED Final   Shigella/Enteroinvasive E coli (EIEC) NOT DETECTED NOT DETECTED Final   Cryptosporidium NOT DETECTED NOT DETECTED Final  Cyclospora cayetanensis NOT DETECTED NOT DETECTED Final   Entamoeba histolytica NOT DETECTED NOT DETECTED Final   Giardia lamblia NOT DETECTED NOT DETECTED Final   Adenovirus F40/41 NOT DETECTED NOT DETECTED Final   Astrovirus NOT DETECTED NOT DETECTED Final   Norovirus GI/GII NOT DETECTED NOT DETECTED Final   Rotavirus A NOT DETECTED NOT DETECTED Final   Sapovirus (I, II, IV, and V) NOT DETECTED NOT DETECTED Final    Comment: Performed at [facility], [address], [place], [place] [address]         Radiology Studies: No results found.      Scheduled Meds:  amLODipine  2.5 mg Oral Daily   apixaban  2.5 mg Oral BID   finasteride  5 mg Oral Daily   insulin aspart  0-15 Units Subcutaneous TID WC   insulin aspart   0-5 Units Subcutaneous QHS   insulin aspart  2 Units Subcutaneous TID WC   insulin glargine-yfgn  10 Units Subcutaneous Daily   mirabegron ER  50 mg Oral QHS   simvastatin  20 mg Oral QHS   traZODone  50 mg Oral QHS   Continuous Infusions:  sodium chloride 75 mL/hr at 09/13/22 1654   cefTRIAXone (ROCEPHIN)  IV 2 g (09/14/22 1210)   metronidazole 500 mg (09/14/22 1033)     LOS: 2 days    Time spent: 52 minutes spent on chart review, discussion with nursing staff, consultants, updating family and interview/physical exam; more than 50% of that time was spent in counseling and/or coordination of care.    Octave Montrose J [place] DO [facility] Available via Epic secure chat 7am-7pm After these hours, please refer to coverage provider listed on [email or web address] 09/14/2022, 1:41 PM

## 2022-09-14 NOTE — Evaluation (Signed)
Physical Therapy Evaluation Patient Details Name: Daniel Cooke MRN: [id number] DOB: 19-Aug-1924 Today's Date: 09/14/2022  History of Present Illness  Patient is 88 y.o. male who presented to [facility] ED on 9/30 nausea, vomiting, diarrhea. PMH significant for type 2 diabetes mellitus, persistent atrial fibrillation on Eliquis, CKD stage IIIb, essential hypertension. CT abdomen/pelvis with acute diverticulitis to proximal sigmoid colon without signs of perforation/abscess.  Patient was started on empiric antibiotics.    Clinical Impression  Daniel Cooke is 88 y.o. male admitted with above HPI and diagnosis. Patient is currently limited by functional impairments below (see PT problem list). Patient lives in his home with a 24/7 caregiver and has assist for all transfers, household gait with RW, and ADL's at baseline. Min assist required today for bed mobility, transfers, and gait with RW. Patient will benefit from continued skilled PT interventions to address impairments and progress independence with mobility, recommending return home with caregiver and 24/7 assist. Acute PT will follow and progress as able.      Recommendations for follow up therapy are one component of a multi-disciplinary discharge planning process, led by the attending physician.  Recommendations may be updated based on patient status, additional functional criteria and insurance authorization.  Follow Up Recommendations Home health PT      Assistance Recommended at Discharge Frequent or constant Supervision/Assistance  Patient can return home with the following  A little help with walking and/or transfers;A little help with bathing/dressing/bathroom;Assistance with cooking/housework;Direct supervision/assist for medications management;Assist for transportation;Help with stairs or ramp for entrance    Equipment Recommendations Rolling walker (2 wheels)  Recommendations for Other Services       Functional Status  Assessment Patient has had a recent decline in their functional status and demonstrates the ability to make significant improvements in function in a reasonable and predictable amount of time.     Precautions / Restrictions Precautions Precautions: Fall Restrictions Weight Bearing Restrictions: No      Mobility  Bed Mobility Overal bed mobility: Needs Assistance Bed Mobility: Supine to Sit     Supine to sit: Min assist, HOB elevated     General bed mobility comments: Multimodal cues for sequencing task. Assist to bring feet off EOB fully and to raise trunk. Pt c/o slight dizziness, BP/HR stable sitting EOB.    Transfers Overall transfer level: Needs assistance Equipment used: Rolling walker (2 wheels) Transfers: Sit to/from Stand Sit to Stand: Min assist           General transfer comment: Cues for technique with RW, min assist to initiate power up and steady rise to walker.    Ambulation/Gait Ambulation/Gait assistance: Min assist Gait Distance (Feet): 75 Feet Assistive device: Rolling walker (2 wheels) Gait Pattern/deviations: Step-through pattern, Decreased stride length, Shuffle Gait velocity: decr     General Gait Details: pt with slow gait, cues and assist to maintain safe proximity to RW, cues for posture improved pt's position to walker. as pt fatigued knees slightly more flexed.  Stairs            Wheelchair Mobility    Modified Rankin (Stroke Patients Only)       Balance Overall balance assessment: Needs assistance Sitting-balance support: Feet supported Sitting balance-Leahy Scale: Good     Standing balance support: During functional activity, Reliant on assistive device for balance, Bilateral upper extremity supported Standing balance-Leahy Scale: Fair  Pertinent Vitals/Pain Pain Assessment Pain Assessment: No/denies pain    Home Living Family/patient expects to be discharged to:: Private  residence Living Arrangements: Children Available Help at Discharge: Personal care attendant;Family;Available 24 hours/day Type of Home: House Home Access: Stairs to enter Entrance Stairs-Rails: Can reach both (in garage can reach both) Entrance Stairs-Number of Steps: 5   Home Layout: One level Home Equipment: Conservation officer, nature (2 wheels);Cane - single point;Wheelchair - manual;BSC/3in1 Additional Comments: "nelly" is his 24/7 caregiver to assist with him. - pt has assist for household mobility and all ADL's.    Prior Function Prior Level of Function : Needs assist                     Hand Dominance   Dominant Hand: Right    Extremity/Trunk Assessment   Upper Extremity Assessment Upper Extremity Assessment: Generalized weakness    Lower Extremity Assessment Lower Extremity Assessment: Generalized weakness    Cervical / Trunk Assessment Cervical / Trunk Assessment: Kyphotic  Communication   Communication: No difficulties  Cognition Arousal/Alertness: Awake/alert Behavior During Therapy: WFL for tasks assessed/performed Overall Cognitive Status: Within Functional Limits for tasks assessed                                          General Comments      Exercises     Assessment/Plan    PT Assessment Patient needs continued PT services  PT Problem List Decreased strength;Decreased activity tolerance;Decreased balance;Decreased mobility;Decreased knowledge of use of DME;Decreased safety awareness;Decreased knowledge of precautions       PT Treatment Interventions DME instruction;Gait training;Stair training;Therapeutic exercise;Therapeutic activities;Functional mobility training;Balance training;Patient/family education    PT Goals (Current goals can be found in the Care Plan section)  Acute Rehab PT Goals PT Goal Formulation: With patient Time For Goal Achievement: 09/28/22 Potential to Achieve Goals: Good    Frequency Min 3X/week      Co-evaluation               AM-PAC PT "6 Clicks" Mobility  Outcome Measure Help needed turning from your back to your side while in a flat bed without using bedrails?: A Little Help needed moving from lying on your back to sitting on the side of a flat bed without using bedrails?: A Little Help needed moving to and from a bed to a chair (including a wheelchair)?: A Little Help needed standing up from a chair using your arms (e.g., wheelchair or bedside chair)?: A Little Help needed to walk in hospital room?: A Little Help needed climbing 3-5 steps with a railing? : A Lot 6 Click Score: 17    End of Session Equipment Utilized During Treatment: Gait belt Activity Tolerance: Patient tolerated treatment well Patient left: in chair;with call bell/phone within reach;with chair alarm set;with family/visitor present Nurse Communication: Mobility status PT Visit Diagnosis: Muscle weakness (generalized) (M62.81);Difficulty in walking, not elsewhere classified (R26.2);Unsteadiness on feet (R26.81)    Time: [id number] PT Time Calculation (min) (ACUTE ONLY): 32 min   Charges:   PT Evaluation $PT Eval Low Complexity: 1 Low PT Treatments $Gait Training: 8-22 mins        Verner Mould, DPT Acute Rehabilitation Services Office [phone]  09/14/22 12:37 PM

## 2022-09-15 ENCOUNTER — Other Ambulatory Visit (HOSPITAL_COMMUNITY): Payer: Self-pay

## 2022-09-15 DIAGNOSIS — K5792 Diverticulitis of intestine, part unspecified, without perforation or abscess without bleeding: Secondary | ICD-10-CM | POA: Diagnosis not present

## 2022-09-15 LAB — CBC
HCT: 29.6 % — ABNORMAL LOW (ref 39.0–52.0)
Hemoglobin: 9.8 g/dL — ABNORMAL LOW (ref 13.0–17.0)
MCH: 32.5 pg (ref 26.0–34.0)
MCHC: 33.1 g/dL (ref 30.0–36.0)
MCV: 98 fL (ref 80.0–100.0)
Platelets: 198 10*3/uL (ref 150–400)
RBC: 3.02 MIL/uL — ABNORMAL LOW (ref 4.22–5.81)
RDW: 13.3 % (ref 11.5–15.5)
WBC: 7.3 10*3/uL (ref 4.0–10.5)
nRBC: 0 % (ref 0.0–0.2)

## 2022-09-15 LAB — BASIC METABOLIC PANEL
Anion gap: 6 (ref 5–15)
BUN: 38 mg/dL — ABNORMAL HIGH (ref 8–23)
CO2: 23 mmol/L (ref 22–32)
Calcium: 9.1 mg/dL (ref 8.9–10.3)
Chloride: 108 mmol/L (ref 98–111)
Creatinine, Ser: 1.68 mg/dL — ABNORMAL HIGH (ref 0.61–1.24)
GFR, Estimated: 37 mL/min — ABNORMAL LOW (ref >60)
Glucose, Bld: 182 mg/dL — ABNORMAL HIGH (ref 70–99)
Potassium: 4.1 mmol/L (ref 3.5–5.1)
Sodium: 137 mmol/L (ref 135–145)

## 2022-09-15 LAB — GLUCOSE, CAPILLARY
Glucose-Capillary: 105 mg/dL — ABNORMAL HIGH (ref 70–99)
Glucose-Capillary: 155 mg/dL — ABNORMAL HIGH (ref 70–99)
Glucose-Capillary: 143 mg/dL — ABNORMAL HIGH (ref 70–99)

## 2022-09-15 MED ORDER — AMOXICILLIN-POT CLAVULANATE 500-125 MG PO TABS
1.0000 | ORAL_TABLET | Freq: Two times a day (BID) | ORAL | 0 refills | Status: DC
  Filled 2022-09-15: qty 20, 10d supply, fill #0

## 2022-09-15 NOTE — Final Progress Note (Signed)
Pt discharged home with [facility] services.  IV removed without complication.  Antibiotic delivered to room, given to daughter [name].  AVS reviewed with daughter.  Sent home with all belongings, transported by daughter and caregiver via private vehicle.

## 2022-09-15 NOTE — Progress Notes (Signed)
Physical Therapy Treatment Patient Details Name: Daniel Cooke MRN: [id number] DOB: 1924-10-12 Today's Date: 09/15/2022   History of Present Illness Patient is 88 y.o. male who presented to [facility] ED on 9/30 nausea, vomiting, diarrhea. PMH significant for type 2 diabetes mellitus, persistent atrial fibrillation on Eliquis, CKD stage IIIb, essential hypertension. CT abdomen/pelvis with acute diverticulitis to proximal sigmoid colon without signs of perforation/abscess.  Patient was started on empiric antibiotics.    PT Comments    Patient making steady progress with mobility and able to initiate bed mobility and transfers with min assist to raise trunk and steady. Pt continues to require multimodal cues for safe use of RW and posture during gait. No LOB noted throughout. EOS pt resting in recliner with lunch set up. Continue to recommend HHPT and assist from family. Will progress as able.    Recommendations for follow up therapy are one component of a multi-disciplinary discharge planning process, led by the attending physician.  Recommendations may be updated based on patient status, additional functional criteria and insurance authorization.  Follow Up Recommendations  Home health PT     Assistance Recommended at Discharge Frequent or constant Supervision/Assistance  Patient can return home with the following A little help with walking and/or transfers;A little help with bathing/dressing/bathroom;Assistance with cooking/housework;Direct supervision/assist for medications management;Assist for transportation;Help with stairs or ramp for entrance   Equipment Recommendations  Rolling walker (2 wheels)    Recommendations for Other Services       Precautions / Restrictions Precautions Precautions: Fall Restrictions Weight Bearing Restrictions: No     Mobility  Bed Mobility Overal bed mobility: Needs Assistance Bed Mobility: Supine to Sit     Supine to sit: Min assist, HOB  elevated     General bed mobility comments: Multimodal cues for sequencing task. pt able to bring LE's off EOB and assist needed to raise trunk.    Transfers Overall transfer level: Needs assistance Equipment used: Rolling walker (2 wheels) Transfers: Sit to/from Stand Sit to Stand: Min assist           General transfer comment: VC's to initiate, pt using bil UE to power up, assist to steady as pt completed rise and brought hands to walker.    Ambulation/Gait Ambulation/Gait assistance: Min assist Gait Distance (Feet): 160 Feet Assistive device: Rolling walker (2 wheels) Gait Pattern/deviations: Step-through pattern, Decreased stride length, Shuffle, Trunk flexed Gait velocity: decr     General Gait Details: pt with flexed posture and keeping walker too far ahead initially. multimodal cues to keep walker closer and pt's posture and stability improved as gait continued.   Stairs             Wheelchair Mobility    Modified Rankin (Stroke Patients Only)       Balance Overall balance assessment: Needs assistance Sitting-balance support: Feet supported Sitting balance-Leahy Scale: Good     Standing balance support: During functional activity, Reliant on assistive device for balance, Bilateral upper extremity supported Standing balance-Leahy Scale: Poor                              Cognition Arousal/Alertness: Awake/alert Behavior During Therapy: WFL for tasks assessed/performed Overall Cognitive Status: Difficult to assess                                 General Comments: Pt oriented to self, situation  and date. pt thought he was in a hospital in [place]. Difficult to fully assess secondary to being very [facility]        Exercises      General Comments        Pertinent Vitals/Pain      Home Living                          Prior Function            PT Goals (current goals can now be found in the care plan  section) Acute Rehab PT Goals PT Goal Formulation: With patient Time For Goal Achievement: 09/28/22 Potential to Achieve Goals: Good Progress towards PT goals: Progressing toward goals    Frequency    Min 3X/week      PT Plan Current plan remains appropriate    Co-evaluation              AM-PAC PT "6 Clicks" Mobility   Outcome Measure  Help needed turning from your back to your side while in a flat bed without using bedrails?: A Little Help needed moving from lying on your back to sitting on the side of a flat bed without using bedrails?: A Little Help needed moving to and from a bed to a chair (including a wheelchair)?: A Little Help needed standing up from a chair using your arms (e.g., wheelchair or bedside chair)?: A Little Help needed to walk in hospital room?: A Little Help needed climbing 3-5 steps with a railing? : A Lot 6 Click Score: 17    End of Session Equipment Utilized During Treatment: Gait belt Activity Tolerance: Patient tolerated treatment well Patient left: in chair;with call bell/phone within reach (lunch tray in front) Nurse Communication: Mobility status PT Visit Diagnosis: Muscle weakness (generalized) (M62.81);Difficulty in walking, not elsewhere classified (R26.2);Unsteadiness on feet (R26.81)     Time: [id number] PT Time Calculation (min) (ACUTE ONLY): 18 min  Charges:  $Gait Training: 8-22 mins                     Verner Mould, DPT Acute Rehabilitation Services Office [phone]  09/15/22 12:51 PM

## 2022-09-15 NOTE — Care Management Important Message (Signed)
Important Message  Patient Details IM Letter given Name: Daniel Cooke MRN: [id number] Date of Birth: [birth year]   Medicare Important Message Given:  Yes     Kerin Salen 09/15/2022, 11:21 AM

## 2022-09-15 NOTE — Discharge Summary (Signed)
Physician Discharge Summary  Daniel Sabinarnest P Altland ZOX:[id number] DOB: November 28, 1924 DOA: 09/12/2022  PCP: Shon Haleimberlake, Kathryn S, MD  Admit date: 09/12/2022 Discharge date: 09/15/2022  Admitted From: Home Disposition: Home  Recommendations for Outpatient Follow-up:  Follow up with PCP in 1-2 weeks Continue Augmentin to complete antibiotic course for acute diverticulitis  Home Health: PT/OT Equipment/Devices: None  Discharge Condition: Stable CODE STATUS: Full code Diet recommendation: Heart healthy/consistent carb regular diet  History of present illness:  Daniel Cooke is a 88 y.o.  male [employer] veteran with past medical history significant for type 2 diabetes mellitus, persistent atrial fibrillation on Eliquis, CKD stage IIIb, essential hypertension who [facility]30 nausea, vomiting, diarrhea.  Onset since last Saturday and was seen by PCP who started Imodium.  Diarrhea initially cleared up and the Imodium was stopped unfortunately with return.  Daughter also reported that the patient did complain of left lower sided abdominal pain; and the patient was seen again by his PCP on Wednesday with unremarkable CT scan of the abdomen/pelvis but did note an elevated WBC count.  Additionally patient has become increasingly fatigued with poor appetite.  Given persistence/worsening of symptoms, patient was brought to the ED for further evaluation.   In the ED, temperature 97.9 F, HR 73, RR 20, BP 118/43, SPO2 100% on room air.  WBC count 13.0, hemoglobin 11.7, platelets 233.  Sodium 135, potassium 3.7, chloride 100, CO2 22, glucose 249, BUN 54, creatinine 2.01.  Lipase 21.  AST 43, ALT 37, total bilirubin 2.1.  COVID-19 PCR negative.  Influenza A/B PCR negative.  Urinalysis unrevealing.  FOBT negative.  C. difficile negative.  CT abdomen/pelvis with acute diverticulitis to proximal sigmoid colon without signs of perforation/abscess.  Patient was started on empiric antibiotics.  TRH  consulted for admission and further evaluation and treatment of acute diverticulitis.  Hospital course:  Acute sigmoid diverticulitis Patient presenting to ED with nausea, vomiting, left lower quadrant abdominal pain with persistent diarrhea for 1 week.  Elevated WBC count of 13.0 and CT abdomen/pelvis with findings of acute diverticulitis of proximal sigmoid colon without signs of perforation/abscess.  C. difficile/GI PCR negative.  FOBT negative.  Patient was started on ceftriaxone and metronidazole with improvement of symptoms and resolution of leukocytosis.  We will continue Augmentin on discharge.  Outpatient follow-up with PCP.  Consider outpatient follow-up with gastroenterology, but given advanced age likely due defer colonoscopy in 6-8 weeks.   Acute renal failure on CKD stage IIIb Creatinine 2.01 on admission.  Patient was treated with IV fluid hydration with improvement of creatinine to 1.68 at time of discharge.  Recommend repeat BMP 1 week.   Hypokalemia Etiology likely secondary to GI loss.  Repleted during the hospitalization with potassium 4.1 at time of discharge.   Type 2 diabetes mellitus, with hyperglycemia Hemoglobin A1c 8.3, not optimally controlled.  Home regimen includes Lantus 12-16 units subcutaneously nightly, Humalog 2 units with dinner.  Outpatient follow-up with PCP for further guidance and optimization of diabetic regimen.   Persistent atrial fibrillation Continue Eliquis 2.5 mg p.o. twice daily   Essential hypertension Continue amlodipine 2.5 mg p.o. daily   BPH: Finasteride 5 mg p.o. daily   HLD: Simvastatin 20 mg p.o. daily   Weakness/debility/deconditioning: Patient reports that he lives in a home with a 24/7 caregiver.  Seen by PT/OT during hospitalization with recommendation of home health.    Discharge Diagnoses:  Principal Problem:   Diverticulitis    Discharge Instructions  Discharge Instructions     Call MD for:  difficulty breathing,  headache or visual disturbances   Complete by: As directed    Call MD for:  extreme fatigue   Complete by: As directed    Call MD for:  persistant dizziness or light-headedness   Complete by: As directed    Call MD for:  persistant nausea and vomiting   Complete by: As directed    Call MD for:  severe uncontrolled pain   Complete by: As directed    Diet - low sodium heart healthy   Complete by: As directed    Increase activity slowly   Complete by: As directed       Allergies as of 09/15/2022       Reactions   Codeine Nausea And Vomiting, Other (See Comments)   MAKES ME CRAZY   Oxycodone Nausea And Vomiting, Other (See Comments)   MAKES ME CRAZY   Nsaids    Other reaction(s): Contraindicated d/t CKD   Other Other (See Comments)   All narcotic pain medicine makes him crazy.    Oxybutynin    Other reaction(s): dry mouth   Tramadol Hcl    Other reaction(s): altered mental status        Medication List     TAKE these medications    acetaminophen 500 MG tablet Commonly known as: TYLENOL Take 500 mg by mouth 2 (two) times daily.   amLODipine 2.5 MG tablet Commonly known as: NORVASC Take 2.5 mg by mouth daily.   amoxicillin-clavulanate 500-125 MG tablet Commonly known as: Augmentin Take 1 tablet (500 mg total) by mouth every 12 (twelve) hours for 10 days.   BD Pen Needle Nano U/F 32G X 4 MM Misc Generic drug: Insulin Pen Needle USE AS DIRECTED ONCE DAILY FOR 30 DAYS   CALCIUM PO Take 600 mg by mouth daily.   carboxymethylcellulose 0.5 % Soln Commonly known as: REFRESH PLUS Place 1 drop into both eyes daily as needed (dry eyes).   Eliquis 2.5 MG Tabs tablet Generic drug: apixaban Take 1 tablet by mouth twice daily   finasteride 5 MG tablet Commonly known as: PROSCAR Take 5 mg by mouth daily.   FreeStyle Libre 14 Day Reader Kerrin Mo See admin instructions.   furosemide 40 MG tablet Commonly known as: LASIX Take 1/2 (one-half) tablet by mouth once  daily What changed: See the new instructions.   HumaLOG KwikPen 100 UNIT/ML KwikPen Generic drug: insulin lispro Inject 2 Units into the skin daily with supper.   Lantus SoloStar 100 UNIT/ML Solostar Pen Generic drug: insulin glargine Inject 4 Units into the skin at bedtime. What changed: how much to take   Myrbetriq 50 MG Tb24 tablet Generic drug: mirabegron ER Take 50 mg by mouth at bedtime.   omeprazole 20 MG capsule Commonly known as: PRILOSEC Take 20 mg by mouth every morning.   simvastatin 20 MG tablet Commonly known as: ZOCOR Take 20 mg by mouth at bedtime.   traZODone 50 MG tablet Commonly known as: DESYREL Take 50 mg by mouth at bedtime.   VITAMIN D3 PO Take 1,000 Units by mouth daily.        Follow-up Information     [facility] Follow up.   Specialty: Home Health Services Why: Alvis Lemmings is to follow up with you to provide home health physical and occupational therapies. Contact information: [address] [place] [address] [phone]         Sela Hilding  S, MD. Schedule an appointment as soon as possible for a visit in 1 week(s).   Specialty: Family Medicine Contact information: [place] [address] [phone]                Allergies  Allergen Reactions   Codeine Nausea And Vomiting and Other (See Comments)    MAKES ME CRAZY   Oxycodone Nausea And Vomiting and Other (See Comments)    MAKES ME CRAZY   Nsaids     Other reaction(s): Contraindicated d/t CKD   Other Other (See Comments)    All narcotic pain medicine makes him crazy.    Oxybutynin     Other reaction(s): dry mouth   Tramadol Hcl     Other reaction(s): altered mental status    Consultations: none   Procedures/Studies: CT ABDOMEN PELVIS WO CONTRAST  Result Date: 09/12/2022 CLINICAL DATA:  Right upper quadrant rigidity.  Diarrhea. EXAM: CT ABDOMEN AND PELVIS WITHOUT CONTRAST TECHNIQUE: Multidetector CT  imaging of the abdomen and pelvis was performed following the standard protocol without IV contrast. RADIATION DOSE REDUCTION: This exam was performed according to the departmental dose-optimization program which includes automated exposure control, adjustment of the mA and/or kV according to patient size and/or use of iterative reconstruction technique. COMPARISON:  09/10/2022 FINDINGS: Lower chest: No acute abnormality. Chronic interstitial reticulation noted within the lung bases. Hepatobiliary: No focal liver abnormality. Status post cholecystectomy. No signs of biliary dilatation. Pancreas: Unremarkable. No pancreatic ductal dilatation or surrounding inflammatory changes. Spleen: Normal in size without focal abnormality. Adrenals/Urinary Tract: Normal adrenal glands. Bilateral renal cortical thinning is identified. Bosniak class 1 cyst arises off the inferior pole of the left kidney measuring 1.4 cm, image 35/2. No follow-up imaging recommended. Urinary bladder is unremarkable. Stomach/Bowel: Stomach appears nondistended. Status post appendectomy. No small bowel wall thickening, inflammation, or distension. Sigmoid diverticulosis identified. There is mild wall thickening with surrounding fat stranding involving the proximal segment of the sigmoid colon which is concerning for uncomplicated acute diverticulitis. No signs of perforation or abscess. Vascular/Lymphatic: Aortic atherosclerosis. No abdominopelvic adenopathy. Reproductive: Prostate is unremarkable. Other: There is no free fluid or fluid collections. No signs of pneumoperitoneum. Musculoskeletal: Healed left inferior pubic rami fracture. Spondylosis identified within the thoracolumbar spine. Treated compression deformities are identified at L1 and L4. Stable untreated compression fractures noted at T12 and L5. IMPRESSION: 1. Suspect mild, uncomplicated acute diverticulitis of the proximal segment of the sigmoid colon. No signs of perforation or  abscess. 2. Chronic interstitial reticulation noted within the lung bases. 3. Stable untreated compression fractures at T12 and L5. Treated compression deformities are identified at L1 and L4. 4. Aortic Atherosclerosis (ICD10-I70.0). Electronically Signed   By: Kerby Moors M.D.   On: 09/12/2022 09:16   CT ABDOMEN PELVIS WO CONTRAST  Result Date: 09/10/2022 CLINICAL DATA:  Abdominal pain EXAM: CT ABDOMEN AND PELVIS WITHOUT CONTRAST TECHNIQUE: Multidetector CT imaging of the abdomen and pelvis was performed following the standard protocol without IV contrast. RADIATION DOSE REDUCTION: This exam was performed according to the departmental dose-optimization program which includes automated exposure control, adjustment of the mA and/or kV according to patient size and/or use of iterative reconstruction technique. COMPARISON:  None Available. FINDINGS: Lower chest: Subpleural increased interstitial markings are seen in the periphery of both lower lung fields. In image 4 of series 4, there is 3 mm nodule in right middle lobe. No follow-up is recommended. There are scattered coronary artery calcifications. Calcifications are noted in mitral and  aortic annulus. Minimal pericardial effusion is present. Hepatobiliary: No focal abnormalities are seen in liver. Surgical clips are seen in gallbladder fossa. There is no significant dilation of bile ducts. Pancreas: There is atrophy.  No focal abnormality is seen. Spleen: Unremarkable. Adrenals/Urinary Tract: Adrenals are unremarkable. There is no hydronephrosis. Arterial calcification is seen in right renal artery branch. There are no renal or ureteral stones. There is 1.7 cm smooth marginated exophytic low-density lesion in the lower pole of left kidney suggesting renal cyst. Ureters are not dilated. Urinary bladder is not distended. Small diverticulum is noted in the right lateral margin of the bladder. Stomach/Bowel: Stomach is unremarkable. Small bowel loops are not  dilated. Appendix is not seen. There is no pericecal inflammation. Multiple diverticula seen in colon without signs of focal acute diverticulitis. Vascular/Lymphatic: Scattered arterial calcifications are seen. Reproductive: Unremarkable. Other: There is no ascites or pneumoperitoneum. Small umbilical hernia containing fat is seen. Small bilateral inguinal hernias containing fat are noted. Musculoskeletal: There is decrease in height of bodies of T12, L1, L4 and L5 vertebrae. There is previous vertebroplasty in the bodies of L1 and L4 vertebrae. The deformity in left inferior pubic ramus suggesting old healed fracture. IMPRESSION: There is no evidence of intestinal obstruction or pneumoperitoneum. There is no hydronephrosis. Diverticulosis of colon without signs of focal acute diverticulitis. Coronary artery calcifications are seen. Increased interstitial markings in the periphery of lower lung fields may suggest scarring from chronic interstitial lung disease. 1.7 cm left renal cyst. Compression fractures are seen in multiple lumbar and lower thoracic vertebral bodies suggesting possible old compression fractures. Other findings as described in the body of the report. Electronically Signed   By: Elmer Picker M.D.   On: 09/10/2022 14:02     Subjective: Patient seen examined at bedside, resting comfortably.  Lying in bed.  No complaints this morning and ready for discharge home.  Denies headache, no dizziness, no chest pain, no palpitation, no shortness of breath, no fever/chills/night sweats, no nausea/vomiting/diarrhea, no focal weakness, no cough/congestion, no fatigue, no paresthesias.  No acute events overnight per nurse staff.  Discharge Exam: Vitals:   09/14/22 2227 09/15/22 0538  BP: 131/61 (!) 138/53  Pulse: 66 (!) 59  Resp: 20 20  Temp: 97.8 F (36.6 C) 98.8 F (37.1 C)  SpO2: 100% 98%   Vitals:   09/14/22 0425 09/14/22 1302 09/14/22 2227 09/15/22 0538  BP: (!) 134/56 (!) 134/54  131/61 (!) 138/53  Pulse: 70 71 66 (!) 59  Resp: 17 20 20 20   Temp: 98.2 F (36.8 C) 98.1 F (36.7 C) 97.8 F (36.6 C) 98.8 F (37.1 C)  TempSrc: Oral Oral Oral   SpO2: 94% 99% 100% 98%    Physical Exam: GEN: NAD, alert and oriented x 3, elderly in appearance, hard of hearing HEENT: NCAT, PERRL, EOMI, sclera clear, MMM PULM: CTAB w/o wheezes/crackles, normal respiratory effort, on room air CV: RRR w/o M/G/R GI: abd soft, NTND, NABS, no R/G/M MSK: no peripheral edema, extremities independently NEURO: CN II-XII intact, no focal deficits, sensation to light touch intact PSYCH: normal mood/affect Integumentary: dry/intact, no rashes or wounds    The results of significant diagnostics from this hospitalization (including imaging, microbiology, ancillary and laboratory) are listed below for reference.     Microbiology: Recent Results (from the past 240 hour(s))  Resp Panel by RT-PCR (Flu A&B, Covid) Anterior Nasal Swab     Status: None   Collection Time: 09/12/22  7:05 AM   Specimen: Anterior  Nasal Swab  Result Value Ref Range Status   SARS Coronavirus 2 by RT PCR NEGATIVE NEGATIVE Final    Comment: (NOTE) SARS-CoV-2 target nucleic acids are NOT DETECTED.  The SARS-CoV-2 RNA is generally detectable in upper respiratory specimens during the acute phase of infection. The lowest concentration of SARS-CoV-2 viral copies this assay can detect is 138 copies/mL. A negative result does not preclude SARS-Cov-2 infection and should not be used as the sole basis for treatment or other patient management decisions. A negative result may occur with  improper specimen collection/handling, submission of specimen other than nasopharyngeal swab, presence of viral mutation(s) within the areas targeted by this assay, and inadequate number of viral copies(<138 copies/mL). A negative result must be combined with clinical observations, patient history, and epidemiological information. The  expected result is Negative.  Fact Sheet for Patients:  EntrepreneurPulse.com.au  Fact Sheet for Healthcare Providers:  IncredibleEmployment.be  This test is no t yet approved or cleared by the [place] FDA and  has been authorized for detection and/or diagnosis of SARS-CoV-2 by FDA under an Emergency Use Authorization (EUA). This EUA will remain  in effect (meaning this test can be used) for the duration of the COVID-19 declaration under Section 564(b)(1) of the Act, 21 U.S.C.section 360bbb-3(b)(1), unless the authorization is terminated  or revoked sooner.       Influenza A by PCR NEGATIVE NEGATIVE Final   Influenza B by PCR NEGATIVE NEGATIVE Final    Comment: (NOTE) The Xpert Xpress SARS-CoV-2/FLU/RSV plus assay is intended as an aid in the diagnosis of influenza from Nasopharyngeal swab specimens and should not be used as a sole basis for treatment. Nasal washings and aspirates are unacceptable for Xpert Xpress SARS-CoV-2/FLU/RSV testing.  Fact Sheet for Patients: EntrepreneurPulse.com.au  Fact Sheet for Healthcare Providers: IncredibleEmployment.be  This test is not yet approved or cleared by the [place] FDA and has been authorized for detection and/or diagnosis of SARS-CoV-2 by FDA under an Emergency Use Authorization (EUA). This EUA will remain in effect (meaning this test can be used) for the duration of the COVID-19 declaration under Section 564(b)(1) of the Act, 21 U.S.C. section 360bbb-3(b)(1), unless the authorization is terminated or revoked.  Performed at [facility], [place] [address], [place], [place] [address]   C Difficile Quick Screen w PCR reflex     Status: None   Collection Time: 09/12/22  8:14 AM   Specimen: STOOL  Result Value Ref Range Status   C Diff antigen NEGATIVE NEGATIVE Final   C Diff toxin NEGATIVE NEGATIVE Final   C Diff interpretation  No C. difficile detected.  Final    Comment: Performed at [facility], [place] [address], [place], [place] [address]  Gastrointestinal Panel by PCR , Stool     Status: None   Collection Time: 09/12/22  9:23 AM   Specimen: STOOL  Result Value Ref Range Status   Campylobacter species NOT DETECTED NOT DETECTED Final   Plesimonas shigelloides NOT DETECTED NOT DETECTED Final   Salmonella species NOT DETECTED NOT DETECTED Final   Yersinia enterocolitica NOT DETECTED NOT DETECTED Final   Vibrio species NOT DETECTED NOT DETECTED Final   Vibrio cholerae NOT DETECTED NOT DETECTED Final   Enteroaggregative E coli (EAEC) NOT DETECTED NOT DETECTED Final   Enteropathogenic E coli (EPEC) NOT DETECTED NOT DETECTED Final   Enterotoxigenic E coli (ETEC) NOT DETECTED NOT DETECTED Final   Shiga like toxin producing E coli (STEC) NOT DETECTED NOT  DETECTED Final   Shigella/Enteroinvasive E coli (EIEC) NOT DETECTED NOT DETECTED Final   Cryptosporidium NOT DETECTED NOT DETECTED Final   Cyclospora cayetanensis NOT DETECTED NOT DETECTED Final   Entamoeba histolytica NOT DETECTED NOT DETECTED Final   Giardia lamblia NOT DETECTED NOT DETECTED Final   Adenovirus F40/41 NOT DETECTED NOT DETECTED Final   Astrovirus NOT DETECTED NOT DETECTED Final   Norovirus GI/GII NOT DETECTED NOT DETECTED Final   Rotavirus A NOT DETECTED NOT DETECTED Final   Sapovirus (I, II, IV, and V) NOT DETECTED NOT DETECTED Final    Comment: Performed at [facility], [address] [place], [place] [address]     Labs: BNP (last 3 results) No results for input(s): "BNP" in the last 8760 hours. Basic Metabolic Panel: Recent Labs  Lab 09/12/22 0540 09/13/22 0743 09/14/22 0457 09/15/22 0519  NA 135 138 135 137  K 3.7 3.1* 3.9 4.1  CL 100 104 107 108  CO2 22 24 20* 23  GLUCOSE 249* 116* 166* 182*  BUN 54* 47* 44* 38*  CREATININE 2.01* 1.89* 1.80* 1.68*  CALCIUM 10.1 9.5 8.9 9.1  MG  --   --  2.1  --     Liver Function Tests: Recent Labs  Lab 09/12/22 0540 09/13/22 0743  AST 43* 45*  ALT 37 32  ALKPHOS 71 59  BILITOT 2.1* 1.8*  PROT 6.6 5.7*  ALBUMIN 3.8 3.1*   Recent Labs  Lab 09/12/22 0540  LIPASE 21   No results for input(s): "AMMONIA" in the last 168 hours. CBC: Recent Labs  Lab 09/12/22 0540 09/13/22 0743 09/14/22 0457 09/15/22 0519  WBC 13.0* 8.3 8.3 7.3  NEUTROABS 11.2*  --   --   --   HGB 11.7* 10.9* 9.8* 9.8*  HCT 34.4* 32.8* 30.2* 29.6*  MCV 94.5 96.5 99.3 98.0  PLT 233 216 199 198   Cardiac Enzymes: No results for input(s): "CKTOTAL", "CKMB", "CKMBINDEX", "TROPONINI" in the last 168 hours. BNP: Invalid input(s): "POCBNP" CBG: Recent Labs  Lab 09/14/22 0754 09/14/22 1057 09/14/22 1729 09/14/22 2231 09/15/22 0834  GLUCAP 154* 231* 179* 105* 155*   D-Dimer No results for input(s): "DDIMER" in the last 72 hours. Hgb A1c Recent Labs    09/12/22 1345  HGBA1C 8.3*   Lipid Profile No results for input(s): "CHOL", "HDL", "LDLCALC", "TRIG", "CHOLHDL", "LDLDIRECT" in the last 72 hours. Thyroid function studies No results for input(s): "TSH", "T4TOTAL", "T3FREE", "THYROIDAB" in the last 72 hours.  Invalid input(s): "FREET3" Anemia work up No results for input(s): "VITAMINB12", "FOLATE", "FERRITIN", "TIBC", "IRON", "RETICCTPCT" in the last 72 hours. Urinalysis    Component Value Date/Time   COLORURINE YELLOW 09/12/2022 0540   APPEARANCEUR CLEAR 09/12/2022 0540   LABSPEC 1.015 09/12/2022 0540   PHURINE 5.5 09/12/2022 0540   GLUCOSEU 100 (A) 09/12/2022 0540   HGBUR NEGATIVE 09/12/2022 0540   BILIRUBINUR NEGATIVE 09/12/2022 0540   KETONESUR NEGATIVE 09/12/2022 0540   PROTEINUR 30 (A) 09/12/2022 0540   UROBILINOGEN 0.2 01/28/2012 1143   NITRITE NEGATIVE 09/12/2022 0540   LEUKOCYTESUR NEGATIVE 09/12/2022 0540   Sepsis Labs Recent Labs  Lab 09/12/22 0540 09/13/22 0743 09/14/22 0457 09/15/22 0519  WBC 13.0* 8.3 8.3 7.3    Microbiology Recent Results (from the past 240 hour(s))  Resp Panel by RT-PCR (Flu A&B, Covid) Anterior Nasal Swab     Status: None   Collection Time: 09/12/22  7:05 AM   Specimen: Anterior Nasal Swab  Result Value Ref Range Status  SARS Coronavirus 2 by RT PCR NEGATIVE NEGATIVE Final    Comment: (NOTE) SARS-CoV-2 target nucleic acids are NOT DETECTED.  The SARS-CoV-2 RNA is generally detectable in upper respiratory specimens during the acute phase of infection. The lowest concentration of SARS-CoV-2 viral copies this assay can detect is 138 copies/mL. A negative result does not preclude SARS-Cov-2 infection and should not be used as the sole basis for treatment or other patient management decisions. A negative result may occur with  improper specimen collection/handling, submission of specimen other than nasopharyngeal swab, presence of viral mutation(s) within the areas targeted by this assay, and inadequate number of viral copies(<138 copies/mL). A negative result must be combined with clinical observations, patient history, and epidemiological information. The expected result is Negative.  Fact Sheet for Patients:  EntrepreneurPulse.com.au  Fact Sheet for Healthcare Providers:  IncredibleEmployment.be  This test is no t yet approved or cleared by the [place] FDA and  has been authorized for detection and/or diagnosis of SARS-CoV-2 by FDA under an Emergency Use Authorization (EUA). This EUA will remain  in effect (meaning this test can be used) for the duration of the COVID-19 declaration under Section 564(b)(1) of the Act, 21 U.S.C.section 360bbb-3(b)(1), unless the authorization is terminated  or revoked sooner.       Influenza A by PCR NEGATIVE NEGATIVE Final   Influenza B by PCR NEGATIVE NEGATIVE Final    Comment: (NOTE) The Xpert Xpress SARS-CoV-2/FLU/RSV plus assay is intended as an aid in the diagnosis of influenza  from Nasopharyngeal swab specimens and should not be used as a sole basis for treatment. Nasal washings and aspirates are unacceptable for Xpert Xpress SARS-CoV-2/FLU/RSV testing.  Fact Sheet for Patients: EntrepreneurPulse.com.au  Fact Sheet for Healthcare Providers: IncredibleEmployment.be  This test is not yet approved or cleared by the [place] FDA and has been authorized for detection and/or diagnosis of SARS-CoV-2 by FDA under an Emergency Use Authorization (EUA). This EUA will remain in effect (meaning this test can be used) for the duration of the COVID-19 declaration under Section 564(b)(1) of the Act, 21 U.S.C. section 360bbb-3(b)(1), unless the authorization is terminated or revoked.  Performed at [facility], [place] [address] [place], [place] [address]   C Difficile Quick Screen w PCR reflex     Status: None   Collection Time: 09/12/22  8:14 AM   Specimen: STOOL  Result Value Ref Range Status   C Diff antigen NEGATIVE NEGATIVE Final   C Diff toxin NEGATIVE NEGATIVE Final   C Diff interpretation No C. difficile detected.  Final    Comment: Performed at [facility], [place] [address], [place], [place] [id number]  Gastrointestinal Panel by PCR , Stool     Status: None   Collection Time: 09/12/22  9:23 AM   Specimen: STOOL  Result Value Ref Range Status   Campylobacter species NOT DETECTED NOT DETECTED Final   Plesimonas shigelloides NOT DETECTED NOT DETECTED Final   Salmonella species NOT DETECTED NOT DETECTED Final   Yersinia enterocolitica NOT DETECTED NOT DETECTED Final   Vibrio species NOT DETECTED NOT DETECTED Final   Vibrio cholerae NOT DETECTED NOT DETECTED Final   Enteroaggregative E coli (EAEC) NOT DETECTED NOT DETECTED Final   Enteropathogenic E coli (EPEC) NOT DETECTED NOT DETECTED Final   Enterotoxigenic E coli (ETEC) NOT DETECTED NOT DETECTED Final   Shiga like toxin producing E  coli (STEC) NOT DETECTED NOT DETECTED Final   Shigella/Enteroinvasive E coli (EIEC) NOT DETECTED  NOT DETECTED Final   Cryptosporidium NOT DETECTED NOT DETECTED Final   Cyclospora cayetanensis NOT DETECTED NOT DETECTED Final   Entamoeba histolytica NOT DETECTED NOT DETECTED Final   Giardia lamblia NOT DETECTED NOT DETECTED Final   Adenovirus F40/41 NOT DETECTED NOT DETECTED Final   Astrovirus NOT DETECTED NOT DETECTED Final   Norovirus GI/GII NOT DETECTED NOT DETECTED Final   Rotavirus A NOT DETECTED NOT DETECTED Final   Sapovirus (I, II, IV, and V) NOT DETECTED NOT DETECTED Final    Comment: Performed at [facility], [address] [place], [place] [address]     Time coordinating discharge: Over 30 minutes  SIGNED:   Aluna Whiston J [place] DO  [facility] 09/15/2022, 10:32 AM

## 2022-09-16 ENCOUNTER — Other Ambulatory Visit: Payer: Self-pay | Admitting: Cardiology

## 2022-09-18 DIAGNOSIS — I5032 Chronic diastolic (congestive) heart failure: Secondary | ICD-10-CM | POA: Diagnosis not present

## 2022-09-18 DIAGNOSIS — N1831 Chronic kidney disease, stage 3a: Secondary | ICD-10-CM | POA: Diagnosis not present

## 2022-09-18 DIAGNOSIS — M4856XD Collapsed vertebra, not elsewhere classified, lumbar region, subsequent encounter for fracture with routine healing: Secondary | ICD-10-CM | POA: Diagnosis not present

## 2022-09-18 DIAGNOSIS — E1165 Type 2 diabetes mellitus with hyperglycemia: Secondary | ICD-10-CM | POA: Diagnosis not present

## 2022-09-18 DIAGNOSIS — I2510 Atherosclerotic heart disease of native coronary artery without angina pectoris: Secondary | ICD-10-CM | POA: Diagnosis not present

## 2022-09-18 DIAGNOSIS — E1122 Type 2 diabetes mellitus with diabetic chronic kidney disease: Secondary | ICD-10-CM | POA: Diagnosis not present

## 2022-09-18 DIAGNOSIS — E7800 Pure hypercholesterolemia, unspecified: Secondary | ICD-10-CM | POA: Diagnosis not present

## 2022-09-18 DIAGNOSIS — N400 Enlarged prostate without lower urinary tract symptoms: Secondary | ICD-10-CM | POA: Diagnosis not present

## 2022-09-18 DIAGNOSIS — K5732 Diverticulitis of large intestine without perforation or abscess without bleeding: Secondary | ICD-10-CM | POA: Diagnosis not present

## 2022-09-18 DIAGNOSIS — I480 Paroxysmal atrial fibrillation: Secondary | ICD-10-CM | POA: Diagnosis not present

## 2022-09-18 DIAGNOSIS — I130 Hypertensive heart and chronic kidney disease with heart failure and stage 1 through stage 4 chronic kidney disease, or unspecified chronic kidney disease: Secondary | ICD-10-CM | POA: Diagnosis not present

## 2022-09-18 DIAGNOSIS — M4854XD Collapsed vertebra, not elsewhere classified, thoracic region, subsequent encounter for fracture with routine healing: Secondary | ICD-10-CM | POA: Diagnosis not present

## 2022-09-18 DIAGNOSIS — E876 Hypokalemia: Secondary | ICD-10-CM | POA: Diagnosis not present

## 2022-09-18 DIAGNOSIS — I700 Atherosclerosis of aorta: Secondary | ICD-10-CM | POA: Diagnosis not present

## 2022-09-18 DIAGNOSIS — K5730 Diverticulosis of large intestine without perforation or abscess without bleeding: Secondary | ICD-10-CM | POA: Diagnosis not present

## 2022-09-18 DIAGNOSIS — N179 Acute kidney failure, unspecified: Secondary | ICD-10-CM | POA: Diagnosis not present

## 2022-09-21 DIAGNOSIS — D492 Neoplasm of unspecified behavior of bone, soft tissue, and skin: Secondary | ICD-10-CM | POA: Diagnosis not present

## 2022-09-21 DIAGNOSIS — L738 Other specified follicular disorders: Secondary | ICD-10-CM | POA: Diagnosis not present

## 2022-09-21 DIAGNOSIS — L728 Other follicular cysts of the skin and subcutaneous tissue: Secondary | ICD-10-CM | POA: Diagnosis not present

## 2022-09-24 DIAGNOSIS — I959 Hypotension, unspecified: Secondary | ICD-10-CM | POA: Diagnosis not present

## 2022-09-24 DIAGNOSIS — R739 Hyperglycemia, unspecified: Secondary | ICD-10-CM | POA: Diagnosis not present

## 2022-09-24 DIAGNOSIS — M549 Dorsalgia, unspecified: Secondary | ICD-10-CM | POA: Diagnosis not present

## 2022-09-24 DIAGNOSIS — R456 Violent behavior: Secondary | ICD-10-CM | POA: Diagnosis not present

## 2022-09-25 ENCOUNTER — Other Ambulatory Visit: Payer: Self-pay

## 2022-09-25 ENCOUNTER — Telehealth (HOSPITAL_COMMUNITY): Payer: Self-pay | Admitting: Pharmacy Technician

## 2022-09-25 ENCOUNTER — Inpatient Hospital Stay (HOSPITAL_COMMUNITY)
Admission: EM | Admit: 2022-09-25 | Discharge: 2022-10-13 | DRG: 371 | Disposition: A | Discharge status: Hospice | Payer: Medicare Other | Attending: Internal Medicine | Admitting: Internal Medicine

## 2022-09-25 ENCOUNTER — Encounter (HOSPITAL_COMMUNITY): Payer: Self-pay | Admitting: Emergency Medicine

## 2022-09-25 ENCOUNTER — Other Ambulatory Visit (HOSPITAL_COMMUNITY): Payer: Self-pay

## 2022-09-25 ENCOUNTER — Emergency Department (HOSPITAL_COMMUNITY): Payer: Medicare Other

## 2022-09-25 DIAGNOSIS — R4182 Altered mental status, unspecified: Secondary | ICD-10-CM

## 2022-09-25 DIAGNOSIS — R627 Adult failure to thrive: Secondary | ICD-10-CM | POA: Diagnosis not present

## 2022-09-25 DIAGNOSIS — G9340 Encephalopathy, unspecified: Secondary | ICD-10-CM | POA: Diagnosis not present

## 2022-09-25 DIAGNOSIS — I5032 Chronic diastolic (congestive) heart failure: Secondary | ICD-10-CM | POA: Diagnosis present

## 2022-09-25 DIAGNOSIS — N281 Cyst of kidney, acquired: Secondary | ICD-10-CM | POA: Diagnosis not present

## 2022-09-25 DIAGNOSIS — S270XXA Traumatic pneumothorax, initial encounter: Secondary | ICD-10-CM | POA: Diagnosis not present

## 2022-09-25 DIAGNOSIS — G319 Degenerative disease of nervous system, unspecified: Secondary | ICD-10-CM | POA: Diagnosis not present

## 2022-09-25 DIAGNOSIS — N183 Chronic kidney disease, stage 3 unspecified: Secondary | ICD-10-CM | POA: Diagnosis present

## 2022-09-25 DIAGNOSIS — K6389 Other specified diseases of intestine: Secondary | ICD-10-CM | POA: Diagnosis not present

## 2022-09-25 DIAGNOSIS — K5732 Diverticulitis of large intestine without perforation or abscess without bleeding: Secondary | ICD-10-CM | POA: Diagnosis present

## 2022-09-25 DIAGNOSIS — K7200 Acute and subacute hepatic failure without coma: Secondary | ICD-10-CM | POA: Diagnosis not present

## 2022-09-25 DIAGNOSIS — L89152 Pressure ulcer of sacral region, stage 2: Secondary | ICD-10-CM | POA: Diagnosis present

## 2022-09-25 DIAGNOSIS — N1832 Chronic kidney disease, stage 3b: Secondary | ICD-10-CM | POA: Diagnosis not present

## 2022-09-25 DIAGNOSIS — N179 Acute kidney failure, unspecified: Secondary | ICD-10-CM

## 2022-09-25 DIAGNOSIS — Z974 Presence of external hearing-aid: Secondary | ICD-10-CM

## 2022-09-25 DIAGNOSIS — G9389 Other specified disorders of brain: Secondary | ICD-10-CM | POA: Diagnosis not present

## 2022-09-25 DIAGNOSIS — R1310 Dysphagia, unspecified: Secondary | ICD-10-CM | POA: Diagnosis not present

## 2022-09-25 DIAGNOSIS — R918 Other nonspecific abnormal finding of lung field: Secondary | ICD-10-CM | POA: Diagnosis not present

## 2022-09-25 DIAGNOSIS — J690 Pneumonitis due to inhalation of food and vomit: Secondary | ICD-10-CM | POA: Diagnosis not present

## 2022-09-25 DIAGNOSIS — J9691 Respiratory failure, unspecified with hypoxia: Secondary | ICD-10-CM | POA: Diagnosis not present

## 2022-09-25 DIAGNOSIS — Z9911 Dependence on respirator [ventilator] status: Secondary | ICD-10-CM | POA: Diagnosis not present

## 2022-09-25 DIAGNOSIS — I272 Pulmonary hypertension, unspecified: Secondary | ICD-10-CM | POA: Diagnosis not present

## 2022-09-25 DIAGNOSIS — I700 Atherosclerosis of aorta: Secondary | ICD-10-CM | POA: Diagnosis not present

## 2022-09-25 DIAGNOSIS — M1711 Unilateral primary osteoarthritis, right knee: Secondary | ICD-10-CM | POA: Diagnosis present

## 2022-09-25 DIAGNOSIS — G928 Other toxic encephalopathy: Secondary | ICD-10-CM | POA: Diagnosis present

## 2022-09-25 DIAGNOSIS — E440 Moderate protein-calorie malnutrition: Secondary | ICD-10-CM | POA: Diagnosis present

## 2022-09-25 DIAGNOSIS — G9341 Metabolic encephalopathy: Secondary | ICD-10-CM | POA: Diagnosis not present

## 2022-09-25 DIAGNOSIS — Z794 Long term (current) use of insulin: Secondary | ICD-10-CM

## 2022-09-25 DIAGNOSIS — Z515 Encounter for palliative care: Secondary | ICD-10-CM

## 2022-09-25 DIAGNOSIS — Z886 Allergy status to analgesic agent status: Secondary | ICD-10-CM

## 2022-09-25 DIAGNOSIS — L8990 Pressure ulcer of unspecified site, unspecified stage: Secondary | ICD-10-CM | POA: Insufficient documentation

## 2022-09-25 DIAGNOSIS — A0471 Enterocolitis due to Clostridium difficile, recurrent: Secondary | ICD-10-CM | POA: Diagnosis not present

## 2022-09-25 DIAGNOSIS — T797XXA Traumatic subcutaneous emphysema, initial encounter: Secondary | ICD-10-CM | POA: Diagnosis not present

## 2022-09-25 DIAGNOSIS — E876 Hypokalemia: Secondary | ICD-10-CM | POA: Diagnosis not present

## 2022-09-25 DIAGNOSIS — I2489 Other forms of acute ischemic heart disease: Secondary | ICD-10-CM | POA: Diagnosis present

## 2022-09-25 DIAGNOSIS — U071 COVID-19: Secondary | ICD-10-CM | POA: Diagnosis not present

## 2022-09-25 DIAGNOSIS — H9193 Unspecified hearing loss, bilateral: Secondary | ICD-10-CM | POA: Diagnosis present

## 2022-09-25 DIAGNOSIS — I4819 Other persistent atrial fibrillation: Secondary | ICD-10-CM | POA: Diagnosis not present

## 2022-09-25 DIAGNOSIS — I10 Essential (primary) hypertension: Secondary | ICD-10-CM | POA: Diagnosis present

## 2022-09-25 DIAGNOSIS — E1122 Type 2 diabetes mellitus with diabetic chronic kidney disease: Secondary | ICD-10-CM | POA: Diagnosis not present

## 2022-09-25 DIAGNOSIS — E1165 Type 2 diabetes mellitus with hyperglycemia: Secondary | ICD-10-CM | POA: Diagnosis present

## 2022-09-25 DIAGNOSIS — M8448XA Pathological fracture, other site, initial encounter for fracture: Secondary | ICD-10-CM | POA: Diagnosis present

## 2022-09-25 DIAGNOSIS — I469 Cardiac arrest, cause unspecified: Secondary | ICD-10-CM | POA: Diagnosis not present

## 2022-09-25 DIAGNOSIS — G931 Anoxic brain damage, not elsewhere classified: Secondary | ICD-10-CM | POA: Diagnosis not present

## 2022-09-25 DIAGNOSIS — Z4682 Encounter for fitting and adjustment of non-vascular catheter: Secondary | ICD-10-CM | POA: Diagnosis not present

## 2022-09-25 DIAGNOSIS — E872 Acidosis, unspecified: Secondary | ICD-10-CM | POA: Diagnosis not present

## 2022-09-25 DIAGNOSIS — A419 Sepsis, unspecified organism: Secondary | ICD-10-CM | POA: Diagnosis not present

## 2022-09-25 DIAGNOSIS — Z96652 Presence of left artificial knee joint: Secondary | ICD-10-CM | POA: Diagnosis present

## 2022-09-25 DIAGNOSIS — Z888 Allergy status to other drugs, medicaments and biological substances status: Secondary | ICD-10-CM

## 2022-09-25 DIAGNOSIS — K529 Noninfective gastroenteritis and colitis, unspecified: Secondary | ICD-10-CM | POA: Diagnosis not present

## 2022-09-25 DIAGNOSIS — R7989 Other specified abnormal findings of blood chemistry: Secondary | ICD-10-CM

## 2022-09-25 DIAGNOSIS — Z8249 Family history of ischemic heart disease and other diseases of the circulatory system: Secondary | ICD-10-CM

## 2022-09-25 DIAGNOSIS — R0600 Dyspnea, unspecified: Secondary | ICD-10-CM | POA: Diagnosis not present

## 2022-09-25 DIAGNOSIS — R6510 Systemic inflammatory response syndrome (SIRS) of non-infectious origin without acute organ dysfunction: Secondary | ICD-10-CM | POA: Diagnosis not present

## 2022-09-25 DIAGNOSIS — D631 Anemia in chronic kidney disease: Secondary | ICD-10-CM | POA: Diagnosis not present

## 2022-09-25 DIAGNOSIS — F05 Delirium due to known physiological condition: Secondary | ICD-10-CM | POA: Diagnosis not present

## 2022-09-25 DIAGNOSIS — A0472 Enterocolitis due to Clostridium difficile, not specified as recurrent: Secondary | ICD-10-CM | POA: Diagnosis not present

## 2022-09-25 DIAGNOSIS — D72829 Elevated white blood cell count, unspecified: Secondary | ICD-10-CM | POA: Diagnosis present

## 2022-09-25 DIAGNOSIS — R55 Syncope and collapse: Secondary | ICD-10-CM | POA: Diagnosis not present

## 2022-09-25 DIAGNOSIS — D638 Anemia in other chronic diseases classified elsewhere: Secondary | ICD-10-CM | POA: Diagnosis not present

## 2022-09-25 DIAGNOSIS — Z66 Do not resuscitate: Secondary | ICD-10-CM | POA: Diagnosis not present

## 2022-09-25 DIAGNOSIS — Z781 Physical restraint status: Secondary | ICD-10-CM

## 2022-09-25 DIAGNOSIS — E785 Hyperlipidemia, unspecified: Secondary | ICD-10-CM | POA: Diagnosis not present

## 2022-09-25 DIAGNOSIS — J9690 Respiratory failure, unspecified, unspecified whether with hypoxia or hypercapnia: Secondary | ICD-10-CM | POA: Diagnosis not present

## 2022-09-25 DIAGNOSIS — Z79899 Other long term (current) drug therapy: Secondary | ICD-10-CM

## 2022-09-25 DIAGNOSIS — K219 Gastro-esophageal reflux disease without esophagitis: Secondary | ICD-10-CM | POA: Diagnosis not present

## 2022-09-25 DIAGNOSIS — J9601 Acute respiratory failure with hypoxia: Secondary | ICD-10-CM | POA: Diagnosis not present

## 2022-09-25 DIAGNOSIS — K3189 Other diseases of stomach and duodenum: Secondary | ICD-10-CM | POA: Diagnosis not present

## 2022-09-25 DIAGNOSIS — J811 Chronic pulmonary edema: Secondary | ICD-10-CM | POA: Diagnosis not present

## 2022-09-25 DIAGNOSIS — E860 Dehydration: Secondary | ICD-10-CM | POA: Diagnosis present

## 2022-09-25 DIAGNOSIS — N289 Disorder of kidney and ureter, unspecified: Secondary | ICD-10-CM

## 2022-09-25 DIAGNOSIS — N3289 Other specified disorders of bladder: Secondary | ICD-10-CM | POA: Diagnosis not present

## 2022-09-25 DIAGNOSIS — R9431 Abnormal electrocardiogram [ECG] [EKG]: Secondary | ICD-10-CM | POA: Diagnosis not present

## 2022-09-25 DIAGNOSIS — Z7189 Other specified counseling: Secondary | ICD-10-CM | POA: Diagnosis not present

## 2022-09-25 DIAGNOSIS — E7800 Pure hypercholesterolemia, unspecified: Secondary | ICD-10-CM | POA: Diagnosis present

## 2022-09-25 DIAGNOSIS — K5792 Diverticulitis of intestine, part unspecified, without perforation or abscess without bleeding: Secondary | ICD-10-CM | POA: Diagnosis present

## 2022-09-25 DIAGNOSIS — S270XXD Traumatic pneumothorax, subsequent encounter: Secondary | ICD-10-CM | POA: Diagnosis not present

## 2022-09-25 DIAGNOSIS — N401 Enlarged prostate with lower urinary tract symptoms: Secondary | ICD-10-CM | POA: Diagnosis present

## 2022-09-25 DIAGNOSIS — J939 Pneumothorax, unspecified: Secondary | ICD-10-CM | POA: Diagnosis not present

## 2022-09-25 DIAGNOSIS — J90 Pleural effusion, not elsewhere classified: Secondary | ICD-10-CM | POA: Diagnosis not present

## 2022-09-25 DIAGNOSIS — R110 Nausea: Secondary | ICD-10-CM | POA: Diagnosis not present

## 2022-09-25 DIAGNOSIS — J9811 Atelectasis: Secondary | ICD-10-CM | POA: Diagnosis not present

## 2022-09-25 DIAGNOSIS — I130 Hypertensive heart and chronic kidney disease with heart failure and stage 1 through stage 4 chronic kidney disease, or unspecified chronic kidney disease: Secondary | ICD-10-CM | POA: Diagnosis not present

## 2022-09-25 DIAGNOSIS — R338 Other retention of urine: Secondary | ICD-10-CM | POA: Diagnosis present

## 2022-09-25 DIAGNOSIS — Z7901 Long term (current) use of anticoagulants: Secondary | ICD-10-CM

## 2022-09-25 DIAGNOSIS — S22000A Wedge compression fracture of unspecified thoracic vertebra, initial encounter for closed fracture: Secondary | ICD-10-CM | POA: Diagnosis not present

## 2022-09-25 DIAGNOSIS — R0602 Shortness of breath: Secondary | ICD-10-CM | POA: Diagnosis not present

## 2022-09-25 DIAGNOSIS — J9600 Acute respiratory failure, unspecified whether with hypoxia or hypercapnia: Secondary | ICD-10-CM | POA: Diagnosis not present

## 2022-09-25 DIAGNOSIS — J439 Emphysema, unspecified: Secondary | ICD-10-CM | POA: Diagnosis not present

## 2022-09-25 DIAGNOSIS — I6782 Cerebral ischemia: Secondary | ICD-10-CM | POA: Diagnosis not present

## 2022-09-25 DIAGNOSIS — I517 Cardiomegaly: Secondary | ICD-10-CM | POA: Diagnosis not present

## 2022-09-25 DIAGNOSIS — E870 Hyperosmolality and hypernatremia: Secondary | ICD-10-CM | POA: Diagnosis not present

## 2022-09-25 DIAGNOSIS — Z885 Allergy status to narcotic agent status: Secondary | ICD-10-CM

## 2022-09-25 LAB — URINALYSIS, ROUTINE W REFLEX MICROSCOPIC
Bilirubin Urine: NEGATIVE
Glucose, UA: 150 mg/dL — AB
Ketones, ur: NEGATIVE mg/dL
Leukocytes,Ua: NEGATIVE
Nitrite: NEGATIVE
Protein, ur: 100 mg/dL — AB
RBC / HPF: >50 RBC/hpf — ABNORMAL HIGH (ref 0–5)
Specific Gravity, Urine: 1.024 (ref 1.005–1.030)
pH: 5 (ref 5.0–8.0)

## 2022-09-25 LAB — GASTROINTESTINAL PANEL BY PCR, STOOL (REPLACES STOOL CULTURE)

## 2022-09-25 LAB — CBC WITH DIFFERENTIAL/PLATELET
Abs Immature Granulocytes: 0.09 10*3/uL — ABNORMAL HIGH (ref 0.00–0.07)
Basophils Absolute: 0.1 10*3/uL (ref 0.0–0.1)
Basophils Relative: 0 %
Eosinophils Absolute: 0 10*3/uL (ref 0.0–0.5)
Eosinophils Relative: 0 %
HCT: 32.6 % — ABNORMAL LOW (ref 39.0–52.0)
Hemoglobin: 10.5 g/dL — ABNORMAL LOW (ref 13.0–17.0)
Immature Granulocytes: 1 %
Lymphocytes Relative: 7 %
Lymphs Abs: 1.1 10*3/uL (ref 0.7–4.0)
MCH: 32.3 pg (ref 26.0–34.0)
MCHC: 32.2 g/dL (ref 30.0–36.0)
MCV: 100.3 fL — ABNORMAL HIGH (ref 80.0–100.0)
Monocytes Absolute: 0.5 10*3/uL (ref 0.1–1.0)
Monocytes Relative: 3 %
Neutro Abs: 13.4 10*3/uL — ABNORMAL HIGH (ref 1.7–7.7)
Neutrophils Relative %: 89 %
Platelets: 172 10*3/uL (ref 150–400)
RBC: 3.25 MIL/uL — ABNORMAL LOW (ref 4.22–5.81)
RDW: 13.3 % (ref 11.5–15.5)
WBC: 15.2 10*3/uL — ABNORMAL HIGH (ref 4.0–10.5)
nRBC: 0 % (ref 0.0–0.2)

## 2022-09-25 LAB — TROPONIN I (HIGH SENSITIVITY)
Troponin I (High Sensitivity): 53 ng/L — ABNORMAL HIGH (ref <18)
Troponin I (High Sensitivity): 64 ng/L — ABNORMAL HIGH (ref <18)

## 2022-09-25 LAB — C DIFFICILE QUICK SCREEN W PCR REFLEX
C Diff antigen: POSITIVE — AB
C Diff interpretation: DETECTED
C Diff toxin: POSITIVE — AB

## 2022-09-25 LAB — COMPREHENSIVE METABOLIC PANEL
ALT: 29 U/L (ref 0–44)
AST: 31 U/L (ref 15–41)
Albumin: 3.5 g/dL (ref 3.5–5.0)
Alkaline Phosphatase: 75 U/L (ref 38–126)
Anion gap: 10 (ref 5–15)
BUN: 40 mg/dL — ABNORMAL HIGH (ref 8–23)
CO2: 24 mmol/L (ref 22–32)
Calcium: 9.9 mg/dL (ref 8.9–10.3)
Chloride: 105 mmol/L (ref 98–111)
Creatinine, Ser: 1.92 mg/dL — ABNORMAL HIGH (ref 0.61–1.24)
GFR, Estimated: 31 mL/min — ABNORMAL LOW (ref >60)
Glucose, Bld: 273 mg/dL — ABNORMAL HIGH (ref 70–99)
Potassium: 4.8 mmol/L (ref 3.5–5.1)
Sodium: 139 mmol/L (ref 135–145)
Total Bilirubin: 2.3 mg/dL — ABNORMAL HIGH (ref 0.3–1.2)
Total Protein: 6.2 g/dL — ABNORMAL LOW (ref 6.5–8.1)

## 2022-09-25 LAB — GLUCOSE, CAPILLARY: Glucose-Capillary: 130 mg/dL — ABNORMAL HIGH (ref 70–99)

## 2022-09-25 LAB — CBG MONITORING, ED
Glucose-Capillary: 223 mg/dL — ABNORMAL HIGH (ref 70–99)
Glucose-Capillary: 173 mg/dL — ABNORMAL HIGH (ref 70–99)
Glucose-Capillary: 94 mg/dL (ref 70–99)

## 2022-09-25 LAB — MAGNESIUM: Magnesium: 2.3 mg/dL (ref 1.7–2.4)

## 2022-09-25 LAB — LACTIC ACID, PLASMA
Lactic Acid, Venous: 2.4 mmol/L (ref 0.5–1.9)
Lactic Acid, Venous: 3.2 mmol/L (ref 0.5–1.9)

## 2022-09-25 MED ORDER — AMLODIPINE BESYLATE 5 MG PO TABS
2.5000 mg | ORAL_TABLET | Freq: Every day | ORAL | Status: DC
  Administered 2022-09-26: 2.5 mg via ORAL
  Filled 2022-09-25 (×2): qty 1

## 2022-09-25 MED ORDER — ZIPRASIDONE MESYLATE 20 MG IM SOLR
10.0000 mg | Freq: Once | INTRAMUSCULAR | Status: AC
  Administered 2022-09-25: 10 mg via INTRAMUSCULAR
  Filled 2022-09-25: qty 20

## 2022-09-25 MED ORDER — STERILE WATER FOR INJECTION IJ SOLN
INTRAMUSCULAR | Status: AC
  Administered 2022-09-25: 10 mL
  Filled 2022-09-25: qty 10

## 2022-09-25 MED ORDER — FINASTERIDE 5 MG PO TABS
5.0000 mg | ORAL_TABLET | Freq: Every day | ORAL | Status: DC
  Administered 2022-09-26: 5 mg via ORAL
  Filled 2022-09-25 (×2): qty 1

## 2022-09-25 MED ORDER — LACTATED RINGERS IV BOLUS
500.0000 mL | Freq: Once | INTRAVENOUS | Status: AC
  Administered 2022-09-25: 500 mL via INTRAVENOUS

## 2022-09-25 MED ORDER — HALOPERIDOL LACTATE 5 MG/ML IJ SOLN: 5.0000 mg | Freq: Once | INTRAMUSCULAR | Status: DC

## 2022-09-25 MED ORDER — INSULIN ASPART 100 UNIT/ML IJ SOLN
0.0000 [IU] | Freq: Three times a day (TID) | INTRAMUSCULAR | Status: DC
  Administered 2022-09-25: 2 [IU] via SUBCUTANEOUS
  Administered 2022-09-25: 3 [IU] via SUBCUTANEOUS
  Administered 2022-09-26: 2 [IU] via SUBCUTANEOUS
  Administered 2022-09-26 (×2): 3 [IU] via SUBCUTANEOUS
  Administered 2022-09-27 (×2): 2 [IU] via SUBCUTANEOUS
  Administered 2022-09-27 – 2022-09-28 (×2): 1 [IU] via SUBCUTANEOUS

## 2022-09-25 MED ORDER — ACETAMINOPHEN 325 MG PO TABS
650.0000 mg | ORAL_TABLET | Freq: Four times a day (QID) | ORAL | Status: DC | PRN
  Administered 2022-09-26 (×2): 650 mg via ORAL
  Filled 2022-09-25 (×2): qty 2

## 2022-09-25 MED ORDER — SIMVASTATIN 20 MG PO TABS
20.0000 mg | ORAL_TABLET | Freq: Every day | ORAL | Status: DC
  Administered 2022-09-25 – 2022-09-27 (×3): 20 mg via ORAL
  Filled 2022-09-25 (×4): qty 1

## 2022-09-25 MED ORDER — ACETAMINOPHEN 650 MG RE SUPP: 650.0000 mg | Freq: Four times a day (QID) | RECTAL | Status: DC | PRN

## 2022-09-25 MED ORDER — APIXABAN 2.5 MG PO TABS
2.5000 mg | ORAL_TABLET | Freq: Two times a day (BID) | ORAL | Status: DC
  Administered 2022-09-25 – 2022-09-27 (×4): 2.5 mg via ORAL
  Filled 2022-09-25 (×5): qty 1

## 2022-09-25 MED ORDER — IOHEXOL 350 MG/ML SOLN
50.0000 mL | Freq: Once | INTRAVENOUS | Status: AC | PRN
  Administered 2022-09-25: 50 mL via INTRAVENOUS

## 2022-09-25 MED ORDER — INSULIN GLARGINE-YFGN 100 UNIT/ML ~~LOC~~ SOLN
12.0000 [IU] | Freq: Every day | SUBCUTANEOUS | Status: DC
  Administered 2022-09-26 – 2022-09-27 (×2): 12 [IU] via SUBCUTANEOUS
  Filled 2022-09-25 (×4): qty 0.12

## 2022-09-25 MED ORDER — HALOPERIDOL LACTATE 5 MG/ML IJ SOLN
5.0000 mg | Freq: Once | INTRAMUSCULAR | Status: AC
  Administered 2022-09-25: 5 mg via INTRAVENOUS
  Filled 2022-09-25: qty 1

## 2022-09-25 MED ORDER — PANTOPRAZOLE SODIUM 40 MG PO TBEC
40.0000 mg | DELAYED_RELEASE_TABLET | Freq: Every day | ORAL | Status: DC
  Administered 2022-09-26: 40 mg via ORAL
  Filled 2022-09-25 (×2): qty 1

## 2022-09-25 MED ORDER — ALBUTEROL SULFATE (2.5 MG/3ML) 0.083% IN NEBU
2.5000 mg | INHALATION_SOLUTION | Freq: Four times a day (QID) | RESPIRATORY_TRACT | Status: DC | PRN
  Administered 2022-10-10: 2.5 mg via RESPIRATORY_TRACT
  Filled 2022-09-25: qty 3

## 2022-09-25 MED ORDER — TRAZODONE HCL 50 MG PO TABS
50.0000 mg | ORAL_TABLET | Freq: Every day | ORAL | Status: DC
  Administered 2022-09-25 – 2022-09-27 (×3): 50 mg via ORAL
  Filled 2022-09-25 (×3): qty 1

## 2022-09-25 MED ORDER — MIRABEGRON ER 50 MG PO TB24
50.0000 mg | ORAL_TABLET | Freq: Every day | ORAL | Status: DC
  Administered 2022-09-25 – 2022-09-26 (×2): 50 mg via ORAL
  Filled 2022-09-25 (×5): qty 1

## 2022-09-25 MED ORDER — SODIUM CHLORIDE 0.9% FLUSH
3.0000 mL | Freq: Two times a day (BID) | INTRAVENOUS | Status: DC
  Administered 2022-09-25 – 2022-10-07 (×21): 3 mL via INTRAVENOUS

## 2022-09-25 MED ORDER — LORAZEPAM 2 MG/ML IJ SOLN
1.0000 mg | INTRAMUSCULAR | Status: DC | PRN
  Administered 2022-09-26 – 2022-09-28 (×3): 1 mg via INTRAVENOUS
  Filled 2022-09-25 (×4): qty 1

## 2022-09-25 MED ORDER — POLYVINYL ALCOHOL 1.4 % OP SOLN: 1.0000 [drp] | OPHTHALMIC | Status: DC | PRN

## 2022-09-25 MED ORDER — LACTATED RINGERS IV BOLUS
1000.0000 mL | Freq: Once | INTRAVENOUS | Status: AC
  Administered 2022-09-25: 1000 mL via INTRAVENOUS

## 2022-09-25 MED ORDER — ZIPRASIDONE MESYLATE 20 MG IM SOLR
10.0000 mg | Freq: Once | INTRAMUSCULAR | Status: AC
  Administered 2022-09-25: 10 mg via INTRAMUSCULAR

## 2022-09-25 MED ORDER — STERILE WATER FOR INJECTION IJ SOLN
INTRAMUSCULAR | Status: AC
  Filled 2022-09-25: qty 10

## 2022-09-25 MED ORDER — CARBOXYMETHYLCELLULOSE SODIUM 0.5 % OP SOLN: 1.0000 [drp] | Freq: Every day | OPHTHALMIC | Status: DC | PRN

## 2022-09-25 MED ORDER — SACCHAROMYCES BOULARDII 250 MG PO CAPS
250.0000 mg | ORAL_CAPSULE | Freq: Two times a day (BID) | ORAL | Status: DC
  Administered 2022-09-25 – 2022-09-26 (×2): 250 mg via ORAL
  Filled 2022-09-25 (×4): qty 1

## 2022-09-25 MED ORDER — METRONIDAZOLE 500 MG/100ML IV SOLN
500.0000 mg | Freq: Three times a day (TID) | INTRAVENOUS | Status: DC
  Administered 2022-09-25 – 2022-09-30 (×15): 500 mg via INTRAVENOUS
  Filled 2022-09-25 (×15): qty 100

## 2022-09-25 MED ORDER — VANCOMYCIN HCL 125 MG PO CAPS
125.0000 mg | ORAL_CAPSULE | Freq: Four times a day (QID) | ORAL | Status: DC
  Administered 2022-09-25 – 2022-09-27 (×6): 125 mg via ORAL
  Filled 2022-09-25 (×20): qty 1

## 2022-09-25 MED ORDER — SODIUM CHLORIDE 0.9 % IV SOLN: INTRAVENOUS | Status: DC

## 2022-09-25 NOTE — H&P (Addendum)
History and Physical    Patient: UNKOWN Cooke [id number] DOB: [birth year] DOA: 09/25/2022 DOS: the patient was seen and examined on 09/25/2022 PCP: Glenis Smoker, MD  Patient coming from: Home  Chief Complaint:  Chief Complaint  Patient presents with   Altered mental status   HPI: Daniel Cooke is a 88 y.o. male with medical history significant of hypertension, persistent atrial fibrillation on Eliquis, CKD stage IIIb, diabetes mellitus type 2, and BPH who presents after being noted to be acutely altered since yesterday.  History is obtained from the patient's daughter over the phone and with review of records as the patient is currently altered.  Patient just repeating he wants to get up, but denies any other complaints at this time.  Patient had just recently been hospitalized from 9/30-10/3 with acute diverticulitis of the proximal sigmoid colon without signs of perforation or abscess and acute kidney failure.  Patient had been treated with IV fluids, Rocephin, and metronidazole initial.  Leukocytosis resolved and patient was transitioned to long to complete course and kidney function noted to be improved to 1.68 prior to discharge.  Daughter notes that after getting discharged home patient was not feeling well.  He had been taking the Augmentin as well as a probiotic.  He temporarily seem to be doing better, but still is having some diarrhea.  Yesterday however patient was noted to be significantly weak and unable to stand.  Normally he is alert and oriented and only has some mild memory issues from time to time.  Patient was noted to be combative and confused.  Daughter notes that this is happened before related to infections in the past.  Upon admission and the emergency department patient was noted to be afebrile with tachypnea and all other vital signs maintained.  Labs significant for WBC 15.2, hemoglobin 10.5, BUN 40, creatinine 1.92, and high-sensitivity troponin 53->  64.  C. difficile antigen positive and toxin positive.  CT scan of the head noted no acute abnormality.  CT scan of the abdomen and pelvis noted increasing inflammatory changes surrounding the sigmoid colon and compatible with acute diverticulitis and stable pressure fracture of T12/L5.  In the ED patient was noted to be significantly agitated which he had been given multiple doses of Geodon, Haldol, and Ativan.  Patient had also received 1 L of lactated Ringer's and started on oral vancomycin.  Urinalysis noted large hemoglobin, rare bacteria, greater than 50 RBCs/hpf, and 0-5 WBCs.  Review of Systems: As mentioned in the history of present illness. All other systems reviewed and are negative. Past Medical History:  Diagnosis Date   Arthritis    "was in left knee before replacement; now have it in my right knee" (11/25/2016)   BPH (benign prostatic hypertrophy)    Chronic diastolic CHF (congestive heart failure) ([place])    a. 02/2016 Echo: EF 60-65%, no rwma, triv AI, mild MR, mildly to mod dil LA, mod dil RA, PASP 31mmHg.   Dysrhythmia    Essential hypertension    Hard of hearing    wears bilateral hearing aids   Hypercholesterolemia    Osteoarthritis    PAF (paroxysmal atrial fibrillation) ([place])    a. CHADS2VASC score of 4 --> coumadin.   Pilonidal cyst    PAST HX - NO PROBLEM NOW   Pneumonia    "one time; years ago" (11/25/2016)   Shingles    "long time ago"   Synovitis of knee 02/27/2014   Type II diabetes mellitus ([place])  oral meds - no insulin   Past Surgical History:  Procedure Laterality Date   APPENDECTOMY     CARDIOVERSION N/A 02/04/2017   Procedure: CARDIOVERSION;  Surgeon: Lelon Perla, MD;  Location[facility] ENDOSCOPY;  Service: Cardiovascular;  Laterality: N/A;   CATARACT EXTRACTION W/ INTRAOCULAR LENS IMPLANT Left 10/2016   CHOLECYSTECTOMY OPEN     COLONOSCOPY W/ BIOPSIES  08/2005   Archie Endo 04/27/2011   EYE SURGERY Right    "right" traumatic cataract removed--injury to  the eye--states his eyesight is ok in left eye   JOINT REPLACEMENT     KNEE ARTHROSCOPY Left 02/28/2014   Procedure: ARTHROSCOPY LEFT KNEE WITH SYNOVECTOMY;  Surgeon: Gearlean Alf, MD;  Location: [facility] ORS;  Service: Orthopedics;  Laterality: Left;   KYPHOPLASTY  2017   KYPHOPLASTY N/A 05/28/2017   Procedure: Lumbar Four Kyphoplasty;  Surgeon: Erline Levine, MD;  Location: [facility];  Service: Neurosurgery;  Laterality: N/A;  L4 Kyphoplasty   SHOULDER OPEN ROTATOR CUFF REPAIR Left    TONSILLECTOMY     TOTAL KNEE ARTHROPLASTY  02/08/2012   Procedure: TOTAL KNEE ARTHROPLASTY;  Surgeon: Gearlean Alf, MD;  Location: [facility] ORS;  Service: Orthopedics;  Laterality: Left;   VASECTOMY     Social History:  reports that he has never smoked. He has never used smokeless tobacco. He reports that he does not drink alcohol and does not use drugs.  Allergies  Allergen Reactions   Codeine Nausea And Vomiting and Other (See Comments)    MAKES ME CRAZY   Oxycodone Nausea And Vomiting and Other (See Comments)    MAKES ME CRAZY   Hydrocodone Nausea Only and Other (See Comments)    Daughter states "messes him up mentally"   Nsaids     Other reaction(s): Contraindicated d/t CKD   Other Other (See Comments)    All narcotic pain medicine makes him crazy.    Oxybutynin     Other reaction(s): dry mouth   Tramadol Hcl     Other reaction(s): altered mental status    Family History  Problem Relation Age of Onset   Heart disease Mother    Heart disease Brother    Heart attack Brother     Prior to Admission medications   Medication Sig Start Date End Date Taking? Authorizing Provider  acetaminophen (TYLENOL) 500 MG tablet Take 500 mg by mouth 2 (two) times daily.   Yes [provider]  amLODipine (NORVASC) 2.5 MG tablet Take 2.5 mg by mouth daily. 05/06/21  Yes [provider]  apixaban (ELIQUIS) 2.5 MG TABS tablet Take 1 tablet by mouth twice daily 07/01/22  Yes Camnitz, Will Hassell Done, MD   CALCIUM PO Take 600 mg by mouth daily.   Yes [provider]  carboxymethylcellulose (REFRESH PLUS) 0.5 % SOLN Place 1 drop into both eyes daily as needed (dry eyes).   Yes [provider]  Cholecalciferol (VITAMIN D3 PO) Take 1,000 Units by mouth daily.   Yes [provider]  finasteride (PROSCAR) 5 MG tablet Take 5 mg by mouth daily. 05/01/19  Yes [provider]  furosemide (LASIX) 40 MG tablet Take 1/2 (one-half) tablet by mouth once daily Patient taking differently: Take 20 mg by mouth daily. 09/16/22  Yes Camnitz, Will Hassell Done, MD  HUMALOG KWIKPEN 100 UNIT/ML KwikPen Inject 2 Units into the skin daily with supper.  12/02/18  Yes [provider]  Insulin Glargine (LANTUS SOLOSTAR) 100 UNIT/ML Solostar Pen Inject 4 Units into the skin at  bedtime. Patient taking differently: Inject 12-16 Units into the skin at bedtime. 11/27/19  Yes Kc, Maren Beach, MD  MYRBETRIQ 50 MG TB24 tablet Take 50 mg by mouth at bedtime. 03/14/21  Yes [provider]  omeprazole (PRILOSEC) 20 MG capsule Take 20 mg by mouth every morning. 08/13/22  Yes [provider]  simvastatin (ZOCOR) 20 MG tablet Take 20 mg by mouth at bedtime.   Yes [provider]  traZODone (DESYREL) 50 MG tablet Take 50 mg by mouth at bedtime. 12/04/19  Yes [provider]  amoxicillin-clavulanate (AUGMENTIN) 500-125 MG tablet Take 1 tablet (500 mg total) by mouth every 12 (twelve) hours for 10 days. Patient not taking: Reported on 09/25/2022 09/15/22 09/25/22  [place] Donnamarie Poag, DO  Continuous Blood Gluc Receiver (FREESTYLE LIBRE 14 DAY READER) DEVI See admin instructions. 12/01/19   [provider]  Insulin Pen Needle (BD PEN NEEDLE NANO U/F) 32G X 4 MM MISC USE AS DIRECTED ONCE DAILY FOR 30 DAYS 10/28/18   [provider]    Physical Exam: Vitals:   09/25/22 0330 09/25/22 0645 09/25/22 0803 09/25/22 0804  BP: (!) 119/41 (!) 145/82 (!) 143/59   Pulse: 61  86 76   Resp:  17 (!) 22   Temp:   (!) 97.4 F (36.3 C)   TempSrc:   Oral   SpO2: 96% 92% 96% 96%    Constitutional: Elderly male who appears to be in some distress currently in four-point restraints with mittens on Eyes: PERRL, lids and conjunctivae normal ENMT: Mucous membranes are dry.  Hard of hearing.  Neck: normal, supple Respiratory: Tachypneic with respirations clear both lung field.  O2 saturations currently maintained on room air. Cardiovascular: irregular irregular,  No extremity edema.  Abdomen: Mild lower abdominal tenderness appreciated. Musculoskeletal: no clubbing / cyanosis. No joint deformity upper and lower extremities.   Skin: no rashes, lesions, ulcers.   Neurologic: CN 2-12 grossly intact.   Strength 5/5 in all 4.  Psychiatric: Confused.  Alert and oriented only to self.  Repeats that he has to get up multiple times.  Data Reviewed:  EKG revealed atrial fibrillation at 66 bpm.  Reviewed labs, imaging and pertinent records as noted above in HPI.  Assessment and Plan: C. difficile colitis Acute.  Patient had been treated for diverticulitis and completed course on Augmentin also taking probiotic.  His daughter noted diarrhea still persisted, but he became acutely confused.  CT of the abdomen and pelvis noted persistent sigmoid diverticulitis.  C. difficile antigen and toxin were positive.  Patient has been started on oral vancomycin. -Admit to a medical telemetry bed -Monitor intake and output -Continue oral vancomycin once able -Metronidazole 500 mg IV every 8 hours interim until able to give p.o. -Probiotics  Leukocytosis SIRS Acute.  WBC elevated at 15.2 with tachycardia and tachypnea meeting SIRS criteria.  Patient has been given IV fluids and antibiotics and blood cultures had not been obtained. -Check lactic acid  Acute metabolic encephalopathy Patient been noted him to be acutely altered and more agitated and confused. Family only reported mild issues  with memory intermittently at baseline. -Delirium precautions -Neurochecks  Renal insufficiency Acute on chronic.  Patient presents with creatinine elevated up to 1.92 with BUN 40.  Baseline creatinine previously been around 1.68 when discharged from hospital on 10/3.  Given elevated BUN to creatinine ratio just prerenal cause supplement my reports of continued diarrhea. -Hold nephrotoxic agents such as diuretics -Continue IV fluids  Persistent atrial fibrillation on  chronic anticoagulation Patient currently rate controlled in atrial fibrillation without use of rate controlling medications. -Continue Eliquis  Elevated troponin High-sensitivity troponin 53-> 64.  EKG without significant ischemic changes.  Patient does not report any complaints of chest pain at this time.  Possibly secondary to demand in setting of worsening kidney function. -Continue to monitor  Uncontrolled diabetes mellitus type 2, with long-term use of insulin Last hemoglobin A1c 8.3 on 9/30. -Hypoglycemic protocols -Pharmacy substitution of Semglee 12 units daily -CBGs before every meal with sensitive SSI -Adjust insulin regimen as needed  Essential hypertension Home blood pressure regimen includes amlodipine 2.5 mg daily and furosemide 20 mg daily. -Continue amlodipine -Held furosemide due to kidney function  Anemia chronic disease Hemoglobin 10.5 g/dL which appears slightly improved from prior, but patient is likely dehydrated.  No reports of bleeding. -Continue to monitor H&H  Hyperlipidemia -Continue simvastatin  Compression fractures Chronic.  Noted on CT T12 and L5 that are not new.  GERD -Continue pharmacy substitution of Protonix   DVT prophylaxis: Eliquis Advance Care Planning:   Code Status: Full Code  Consults: None  Family Communication: Daughter updated over the phone  Severity of Illness: The appropriate patient status for this patient is INPATIENT. Inpatient status is judged to be  reasonable and necessary in order to provide the required intensity of service to ensure the patient's safety. The patient's presenting symptoms, physical exam findings, and initial radiographic and laboratory data in the context of their chronic comorbidities is felt to place them at high risk for further clinical deterioration. Furthermore, it is not anticipated that the patient will be medically stable for discharge from the hospital within 2 midnights of admission.   * I certify that at the point of admission it is my clinical judgment that the patient will require inpatient hospital care spanning beyond 2 midnights from the point of admission due to high intensity of service, high risk for further deterioration and high frequency of surveillance required.*  Author: Norval Morton, MD 09/25/2022 8:14 AM  For on call review [email or web address].

## 2022-09-25 NOTE — Telephone Encounter (Signed)
Pharmacy Patient Advocate Encounter  Insurance verification completed.    The patient is insured through [facility] Part D   The patient is currently admitted and ran test claims for the following: Vancomycin, Dificid.  Copays and coinsurance results were relayed to Inpatient clinical team.

## 2022-09-25 NOTE — ED Provider Notes (Signed)
  Physical Exam  BP (!) 143/59 (BP Location: Right Arm)   Pulse 76   Temp (!) 97.4 F (36.3 C) (Oral)   Resp (!) 22   SpO2 96%   Physical Exam  Procedures  Procedures  ED Course / MDM   Clinical Course as of 09/25/22 1324  Fri Sep 26, 7075  3270 88 year old boyfriend AOx4 and quite functional.  Recent diverticulitis treated with augmentin now presenting with change in mental status with increased aggression and generalized fatigue and weakness per family acts this way when infection. He has had increased nonbloody diarrhea.  He has prerenal AKI with leukocytosis and left shift with positive C. difficile and likely demand ischemia. Admit after CTAP to ensure no toxic megacolon.  [VB]  0800 CT scan showed evidence of diverticulitis with increased inflammatory changes.  No toxic megacolon.  Paging for admission for C. difficile colitis. [VB]  0821 Discussed case with Dr. Tamala Julian who will be down to evaluate the patient put in orders for admission. [VB]    Clinical Course User Index [VB] Elgie Congo, MD   Medical Decision Making Amount and/or Complexity of Data Reviewed Labs: ordered. Radiology: ordered. ECG/medicine tests: ordered.  Risk Prescription drug management. Decision regarding hospitalization.          Elgie Congo, MD 09/25/22 [phone]

## 2022-09-25 NOTE — ED Notes (Signed)
Pt noted to become increasingly confused and agitated. Daughter and caregiver at bedside have attempted to redirect pt several times, however pt remains difficult to re-orient. ED provider at bedside and new medication orders provided. Will administer and monitor for need of further intervention.

## 2022-09-25 NOTE — ED Notes (Signed)
Pt critical lactic acid 2.4.  MD Tamala Julian notified and aware.  No new orders at this time.

## 2022-09-25 NOTE — ED Notes (Signed)
ED TO INPATIENT HANDOFF REPORT  ED Nurse Name and Phone #: De Libman RN [phone]  S Name/Age/Gender Daniel Cooke 88 y.o. male Room/Bed: 026C/026C  Code Status   Code Status: Full Code  Home/SNF/Other Home Patient oriented to: self and place Is this baseline? No      Chief Complaint C. difficile colitis [A04.72]  Triage Note Pt BIB [facility] from home, family reports pt had urinary incontinence episode tonight while trying to get to the bathroom, pt became agitated and restless and caregiver reports pt became combative, which is unusual for pt. Pt c/o back pain, no known fall. Family reports pt has "memory issues" at times. EMS VS: BP 138/58, HR 68, CBG 344   Allergies Allergies  Allergen Reactions   Codeine Nausea And Vomiting and Other (See Comments)    MAKES ME CRAZY   Oxycodone Nausea And Vomiting and Other (See Comments)    MAKES ME CRAZY   Hydrocodone Nausea Only and Other (See Comments)    Daughter states "messes him up mentally"   Nsaids     Other reaction(s): Contraindicated d/t CKD   Other Other (See Comments)    All narcotic pain medicine makes him crazy.    Oxybutynin     Other reaction(s): dry mouth   Tramadol Hcl     Other reaction(s): altered mental status    Level of Care/Admitting Diagnosis ED Disposition     ED Disposition  Admit   Condition  --   [facility]: [place] [[id number]]  Level of Care: Telemetry Medical [104]  May admit patient to [facility] or [facility] if equivalent level of care is available:: No  Covid Evaluation: Asymptomatic - no recent exposure (last 10 days) testing not required  Diagnosis: C. difficile colitis [id number]  Admitting Physician: Norval Morton [id number]  Attending Physician: Norval Morton [id number]  Certification:: I certify this patient will need inpatient services for at least 2 midnights  Estimated Length of Stay: 3          B Medical/Surgery History Past Medical  History:  Diagnosis Date   Arthritis    "was in left knee before replacement; now have it in my right knee" (11/25/2016)   BPH (benign prostatic hypertrophy)    Chronic diastolic CHF (congestive heart failure) ([place])    a. 02/2016 Echo: EF 60-65%, no rwma, triv AI, mild MR, mildly to mod dil LA, mod dil RA, PASP 49mmHg.   Dysrhythmia    Essential hypertension    Hard of hearing    wears bilateral hearing aids   Hypercholesterolemia    Osteoarthritis    PAF (paroxysmal atrial fibrillation) ([place])    a. CHADS2VASC score of 4 --> coumadin.   Pilonidal cyst    PAST HX - NO PROBLEM NOW   Pneumonia    "one time; years ago" (11/25/2016)   Shingles    "long time ago"   Synovitis of knee 02/27/2014   Type II diabetes mellitus ([place])    oral meds - no insulin   Past Surgical History:  Procedure Laterality Date   APPENDECTOMY     CARDIOVERSION N/A 02/04/2017   Procedure: CARDIOVERSION;  Surgeon: Lelon Perla, MD;  Location: [facility] ENDOSCOPY;  Service: Cardiovascular;  Laterality: N/A;   CATARACT EXTRACTION W/ INTRAOCULAR LENS IMPLANT Left 10/2016   CHOLECYSTECTOMY OPEN     COLONOSCOPY W/ BIOPSIES  08/2005   Ar[facility] Endo 04/27/2011   EYE SURGERY Right    "right" traumatic  cataract removed--injury to the eye--states his eyesight is ok in left eye   JOINT REPLACEMENT     KNEE ARTHROSCOPY Left 02/28/2014   Procedure: ARTHROSCOPY LEFT KNEE WITH SYNOVECTOMY;  Surgeon: Gearlean Alf, MD;  Location: [facility];  Service: Orthopedics;  Laterality: Left;   KYPHOPLASTY  2017   KYPHOPLASTY N/A 05/28/2017   Procedure: Lumbar Four Kyphoplasty;  Surgeon: Erline Levine, MD;  Location: [place];  Service: Neurosurgery;  Laterality: N/A;  L4 Kyphoplasty   SHOULDER OPEN ROTATOR CUFF REPAIR Left    TONSILLECTOMY     TOTAL KNEE ARTHROPLASTY  02/08/2012   Procedure: TOTAL KNEE ARTHROPLASTY;  Surgeon: Gearlean Alf, MD;  Location: [facility];  Service: Orthopedics;  Laterality: Left;   VASECTOMY       A IV  Location/Drains/Wounds Patient Lines/Drains/Airways Status     Active Line/Drains/Airways     Name Placement date Placement time Site Days   Peripheral IV 09/25/22 20 G Right Antecubital 09/25/22  --  Antecubital  less than 1            Intake/Output Last 24 hours No intake or output data in the 24 hours ending 09/25/22 2048  Labs/Imaging Results for orders placed or performed during the hospital encounter of 09/25/22 (from the past 48 hour(s))  CBC with Differential     Status: Abnormal   Collection Time: 09/25/22 12:55 AM  Result Value Ref Range   WBC 15.2 (H) 4.0 - 10.5 K/uL   RBC 3.25 (L) 4.22 - 5.81 MIL/uL   Hemoglobin 10.5 (L) 13.0 - 17.0 g/dL   HCT 32.6 (L) 39.0 - 52.0 %   MCV 100.3 (H) 80.0 - 100.0 fL   MCH 32.3 26.0 - 34.0 pg   MCHC 32.2 30.0 - 36.0 g/dL   RDW 13.3 11.5 - 15.5 %   Platelets 172 150 - 400 K/uL   nRBC 0.0 0.0 - 0.2 %   Neutrophils Relative % 89 %   Neutro Abs 13.4 (H) 1.7 - 7.7 K/uL   Lymphocytes Relative 7 %   Lymphs Abs 1.1 0.7 - 4.0 K/uL   Monocytes Relative 3 %   Monocytes Absolute 0.5 0.1 - 1.0 K/uL   Eosinophils Relative 0 %   Eosinophils Absolute 0.0 0.0 - 0.5 K/uL   Basophils Relative 0 %   Basophils Absolute 0.1 0.0 - 0.1 K/uL   Immature Granulocytes 1 %   Abs Immature Granulocytes 0.09 (H) 0.00 - 0.07 K/uL    Comment: Performed at [facility], [address] [place], [place] [address]  Comprehensive metabolic panel     Status: Abnormal   Collection Time: 09/25/22 12:55 AM  Result Value Ref Range   Sodium 139 135 - 145 mmol/L   Potassium 4.8 3.5 - 5.1 mmol/L   Chloride 105 98 - 111 mmol/L   CO2 24 22 - 32 mmol/L   Glucose, Bld 273 (H) 70 - 99 mg/dL    Comment: Glucose reference range applies only to samples taken after fasting for at least 8 hours.   BUN 40 (H) 8 - 23 mg/dL   Creatinine, Ser 1.92 (H) 0.61 - 1.24 mg/dL   Calcium 9.9 8.9 - 10.3 mg/dL   Total Protein 6.2 (L) 6.5 - 8.1 g/dL   Albumin 3.5 3.5 - 5.0 g/dL    AST 31 15 - 41 U/L   ALT 29 0 - 44 U/L   Alkaline Phosphatase 75 38 - 126 U/L   Total Bilirubin 2.3 (H)  0.3 - 1.2 mg/dL   GFR, Estimated 31 (L) >60 mL/min    Comment: (NOTE) Calculated using the CKD-EPI Creatinine Equation (2021)    Anion gap 10 5 - 15    Comment: Performed at [facility], [place] [address] [place], [place] [address]  Troponin I (High Sensitivity)     Status: Abnormal   Collection Time: 09/25/22 12:55 AM  Result Value Ref Range   Troponin I (High Sensitivity) 53 (H) <18 ng/L    Comment: (NOTE) Elevated high sensitivity troponin I (hsTnI) values and significant  changes across serial measurements may suggest ACS but many other  chronic and acute conditions are known to elevate hsTnI results.  Refer to the "Links" section for chest pain algorithms and additional  guidance. Performed at [facility], [place] [address], [place], [place] [address]   Magnesium     Status: None   Collection Time: 09/25/22 12:55 AM  Result Value Ref Range   Magnesium 2.3 1.7 - 2.4 mg/dL    Comment: Performed at [facility] [address], [place], [place] [address]  C Difficile Quick Screen w PCR reflex     Status: Abnormal   Collection Time: 09/25/22  1:35 AM   Specimen: STOOL  Result Value Ref Range   C Diff antigen POSITIVE (A) NEGATIVE   C Diff toxin POSITIVE (A) NEGATIVE   C Diff interpretation Toxin producing C. difficile detected.     Comment: CRITICAL RESULTS CALLED TO, READ BACK BY AND VERIFIED WITH RN K.OWENS @0306  ON 09/25/2022 BY NM Performed at [facility], [place] [address], [place], [place] [address]   Gastrointestinal Panel by PCR , Stool     Status: None   Collection Time: 09/25/22  1:35 AM   Specimen: STOOL  Result Value Ref Range   Campylobacter species NOT DETECTED NOT DETECTED   Plesimonas shigelloides NOT DETECTED NOT DETECTED   Salmonella species NOT DETECTED NOT DETECTED   Yersinia enterocolitica NOT DETECTED NOT DETECTED   Vibrio  species NOT DETECTED NOT DETECTED   Vibrio cholerae NOT DETECTED NOT DETECTED   Enteroaggregative E coli (EAEC) NOT DETECTED NOT DETECTED   Enteropathogenic E coli (EPEC) NOT DETECTED NOT DETECTED   Enterotoxigenic E coli (ETEC) NOT DETECTED NOT DETECTED   Shiga like toxin producing E coli (STEC) NOT DETECTED NOT DETECTED   Shigella/Enteroinvasive E coli (EIEC) NOT DETECTED NOT DETECTED   Cryptosporidium NOT DETECTED NOT DETECTED   Cyclospora cayetanensis NOT DETECTED NOT DETECTED   Entamoeba histolytica NOT DETECTED NOT DETECTED   Giardia lamblia NOT DETECTED NOT DETECTED   Adenovirus F40/41 NOT DETECTED NOT DETECTED   Astrovirus NOT DETECTED NOT DETECTED   Norovirus GI/GII NOT DETECTED NOT DETECTED   Rotavirus A NOT DETECTED NOT DETECTED   Sapovirus (I, II, IV, and V) NOT DETECTED NOT DETECTED    Comment: Performed at [facility], [place], [place] [address]  Troponin I (High Sensitivity)     Status: Abnormal   Collection Time: 09/25/22  7:00 AM  Result Value Ref Range   Troponin I (High Sensitivity) 64 (H) <18 ng/L    Comment: (NOTE) Elevated high sensitivity troponin I (hsTnI) values and significant  changes across serial measurements may suggest ACS but many other  chronic and acute conditions are known to elevate hsTnI results.  Refer to the "Links" section for chest pain algorithms and additional  guidance. Performed at [facility], [place]  [address], [place], [place] [address]   Urinalysis, Routine w reflex microscopic     Status: Abnormal   Collection Time: 09/25/22  8:52 AM  Result Value Ref Range   Color, Urine YELLOW YELLOW   APPearance CLEAR CLEAR   Specific Gravity, Urine 1.024 1.005 - 1.030   pH 5.0 5.0 - 8.0   Glucose, UA 150 (A) NEGATIVE mg/dL   Hgb urine dipstick LARGE (A) NEGATIVE   Bilirubin Urine NEGATIVE NEGATIVE   Ketones, ur NEGATIVE NEGATIVE mg/dL   Protein, ur 100 (A) NEGATIVE mg/dL   Nitrite NEGATIVE NEGATIVE    Leukocytes,Ua NEGATIVE NEGATIVE   RBC / HPF >50 (H) 0 - 5 RBC/hpf   WBC, UA 0-5 0 - 5 WBC/hpf   Bacteria, UA RARE (A) NONE SEEN   Squamous Epithelial / LPF 0-5 0 - 5   Mucus PRESENT     Comment: Performed at [facility], [address] [place], [place] [address]  CBG monitoring, ED     Status: Abnormal   Collection Time: 09/25/22 10:37 AM  Result Value Ref Range   Glucose-Capillary 223 (H) 70 - 99 mg/dL    Comment: Glucose reference range applies only to samples taken after fasting for at least 8 hours.  Lactic acid, plasma     Status: Abnormal   Collection Time: 09/25/22 11:37 AM  Result Value Ref Range   Lactic Acid, Venous 2.4 (HH) 0.5 - 1.9 mmol/L    Comment: CRITICAL RESULT CALLED TO, READ BACK BY AND VERIFIED WITH R.GONZALEZ RN 1234 09/25/22 MCCORMICK K Performed at [facility], [place] [address] [place], [place] [address]   CBG monitoring, ED     Status: Abnormal   Collection Time: 09/25/22 12:03 PM  Result Value Ref Range   Glucose-Capillary 173 (H) 70 - 99 mg/dL    Comment: Glucose reference range applies only to samples taken after fasting for at least 8 hours.  Lactic acid, plasma     Status: Abnormal   Collection Time: 09/25/22  3:32 PM  Result Value Ref Range   Lactic Acid, Venous 3.2 (HH) 0.5 - 1.9 mmol/L    Comment: CRITICAL VALUE NOTED. VALUE IS CONSISTENT WITH PREVIOUSLY REPORTED/CALLED VALUE Performed at [facility], [place] [address], [place], [place] [address]   CBG monitoring, ED     Status: None   Collection Time: 09/25/22  4:42 PM  Result Value Ref Range   Glucose-Capillary 94 70 - 99 mg/dL    Comment: Glucose reference range applies only to samples taken after fasting for at least 8 hours.   CT ABDOMEN PELVIS W CONTRAST  Result Date: 09/25/2022 CLINICAL DATA:  Diverticulitis, complication suspected. New onset of urinary incontinence. EXAM: CT ABDOMEN AND PELVIS WITH CONTRAST TECHNIQUE: Multidetector CT imaging of the abdomen and pelvis  was performed using the standard protocol following bolus administration of intravenous contrast. RADIATION DOSE REDUCTION: This exam was performed according to the departmental dose-optimization program which includes automated exposure control, adjustment of the mA and/or kV according to patient size and/or use of iterative reconstruction technique. CONTRAST:  78mL OMNIPAQUE IOHEXOL 350 MG/ML SOLN COMPARISON:  CT of the abdomen and pelvis without contrast 09/12/2022 and 09/10/2022 FINDINGS: Lower chest: Mild dependent atelectasis and small effusions have increased slightly. Heart size is normal. Coronary artery calcifications are present. Hepatobiliary: No focal liver abnormality is seen. Status post cholecystectomy. No biliary dilatation. Pancreas: Unremarkable. No pancreatic ductal dilatation or surrounding inflammatory changes. Spleen: Normal in size without focal  abnormality. Adrenals/Urinary Tract: Adrenal glands are normal bilaterally. Thinning of the renal parenchyma noted. 15 mm simple cyst is present at the lower pole of the left kidney. No other focal lesions are present. No obstruction is present. Ureters are within normal limits. Right-sided bladder diverticuli are stable. Urinary bladder is otherwise within normal limits. Stomach/Bowel: The stomach and duodenum are within normal limits. Small bowel is unremarkable. Terminal ileum is normal. The appendix is not discretely visualized and may be surgically absent. Ascending and transverse colon are within normal limits. Descending colon is unremarkable. Inflammatory changes surrounding the sigmoid colon have increased since the prior exam. No free fluid or free air is present. A rectal catheter is in place. Vascular/Lymphatic: Atherosclerotic calcifications are present in the abdominal aorta and branch vessels without aneurysm. No significant adenopathy is present. Reproductive: Prostate is unremarkable. Other: No abdominal wall hernia or abnormality. No  abdominopelvic ascites. Musculoskeletal: Compression fractures at T12 and L5 are stable. Spinal augmentation noted at L1 and L4. No acute abnormalities are present. IMPRESSION: 1. Increasing inflammatory changes surrounding the sigmoid colon compatible with acute diverticulitis. No complicating features are present. 2. Stable right-sided bladder diverticuli. 3. Stable compression fractures at T12 and L5. 4. Coronary artery disease. 5.  Aortic Atherosclerosis (ICD10-I70.0). Electronically Signed   By: San Morelle M.D.   On: 09/25/2022 07:47   CT Head Wo Contrast  Result Date: 09/25/2022 CLINICAL DATA:  Altered mental status. New onset urinary incontinence. EXAM: CT HEAD WITHOUT CONTRAST TECHNIQUE: Contiguous axial images were obtained from the base of the skull through the vertex without intravenous contrast. RADIATION DOSE REDUCTION: This exam was performed according to the departmental dose-optimization program which includes automated exposure control, adjustment of the mA and/or kV according to patient size and/or use of iterative reconstruction technique. COMPARISON:  None Available. FINDINGS: Brain: Mild atrophy and white matter changes are within normal limits for age. No acute infarct, hemorrhage, or mass lesion is present. Deep brain nuclei are within normal limits. The ventricles are proportionate to the degree of atrophy. No significant extraaxial fluid collection is present. The brainstem and cerebellum are within normal limits. Vascular: Atherosclerotic calcifications are present within the cavernous internal carotid arteries. No hyperdense vessel is present. Skull: No significant extracranial soft tissue lesion is present. Calvarium is intact. No focal lytic or blastic lesions are present. Sinuses/Orbits: Right frontal and anterior ethmoid air cell disease appears chronic. Mild mucosal thickening is present in the anterior ethmoid air cells bilaterally. Maxillary sinuses and sphenoid  sinuses are clear. Mastoid air cells are clear. Right-sided scleral banding is noted. Bilateral lens replacements are present. Globes and orbits are otherwise within normal limits. IMPRESSION: 1. Normal CT appearance of the brain for age. 2. Right frontal and anterior ethmoid air cell disease appears chronic. Electronically Signed   By: San Morelle M.D.   On: 09/25/2022 07:40    Pending Labs Unresulted Labs (From admission, onward)     Start     Ordered   09/26/22 0500  CBC  Tomorrow morning,   R        09/25/22 0855   09/26/22 [id number]  Basic metabolic panel  Tomorrow morning,   R        09/25/22 0855            Vitals/Pain Today's Vitals   09/25/22 1500 09/25/22 1815 09/25/22 1900 09/25/22 1930  BP: (!) 163/93 (!) 144/106 (!) 178/64   Pulse: 92 94 98   Resp: 20 (!) 22 (!) 22  Temp:    99.1 F (37.3 C)  TempSrc:    Axillary  SpO2: 92% 93% 94%     Isolation Precautions Enteric precautions (UV disinfection)  Medications Medications  vancomycin (VANCOCIN) capsule 125 mg (125 mg Oral Not Given 09/25/22 1816)  sterile water (preservative free) injection (  Not Given 09/25/22 0428)  LORazepam (ATIVAN) injection 1 mg (has no administration in time range)  haloperidol lactate (HALDOL) injection 5 mg (0 mg Intramuscular Hold 09/25/22 0823)  traZODone (DESYREL) tablet 50 mg (has no administration in time range)  insulin aspart (novoLOG) injection 0-9 Units ( Subcutaneous Not Given 09/25/22 1644)  insulin glargine-yfgn (SEMGLEE) injection 12 Units (12 Units Subcutaneous Not Given 09/25/22 1048)  finasteride (PROSCAR) tablet 5 mg (5 mg Oral Not Given 09/25/22 1005)  apixaban (ELIQUIS) tablet 2.5 mg (2.5 mg Oral Not Given 09/25/22 1005)  pantoprazole (PROTONIX) EC tablet 40 mg (40 mg Oral Not Given 09/25/22 1005)  sodium chloride flush (NS) 0.9 % injection 3 mL (3 mLs Intravenous Given 09/25/22 1038)  acetaminophen (TYLENOL) tablet 650 mg (has no administration in time range)     Or  acetaminophen (TYLENOL) suppository 650 mg (has no administration in time range)  albuterol (PROVENTIL) (2.5 MG/3ML) 0.083% nebulizer solution 2.5 mg (has no administration in time range)  polyvinyl alcohol (LIQUIFILM TEARS) 1.4 % ophthalmic solution 1 drop (has no administration in time range)  saccharomyces boulardii (FLORASTOR) capsule 250 mg (250 mg Oral Not Given 09/25/22 1124)  amLODipine (NORVASC) tablet 2.5 mg (2.5 mg Oral Not Given 09/25/22 1124)  simvastatin (ZOCOR) tablet 20 mg (has no administration in time range)  mirabegron ER (MYRBETRIQ) tablet 50 mg (has no administration in time range)  0.9 %  sodium chloride infusion ( Intravenous Rate/Dose Verify 09/25/22 1948)  metroNIDAZOLE (FLAGYL) IVPB 500 mg (500 mg Intravenous New Bag/Given 09/25/22 2001)  lactated ringers bolus 500 mL (has no administration in time range)  lactated ringers bolus 1,000 mL (0 mLs Intravenous Stopped 09/25/22 0347)  haloperidol lactate (HALDOL) injection 5 mg (5 mg Intravenous Given 09/25/22 0347)  ziprasidone (GEODON) injection 10 mg (10 mg Intramuscular Given 09/25/22 0412)  sterile water (preservative free) injection (10 mLs  Given 09/25/22 0428)  ziprasidone (GEODON) injection 10 mg (10 mg Intramuscular Given 09/25/22 0428)  iohexol (OMNIPAQUE) 350 MG/ML injection 50 mL (50 mLs Intravenous Contrast Given 09/25/22 0728)    Mobility walks with device High fall risk   Focused Assessments Cardiac Assessment Handoff:  Cardiac Rhythm: Normal sinus rhythm Lab Results  Component Value Date   TROPONINI 0.04 ([place]) 11/26/2016   Lab Results  Component Value Date   DDIMER 0.43 05/24/2021   Does the Patient currently have chest pain? No   , Neuro Assessment Handoff:  Swallow screen pass? Yes  Cardiac Rhythm: Normal sinus rhythm       Neuro Assessment: Exceptions to [facility] Neuro Checks:      Last Documented NIHSS Modified Score:   Has TPA been given? No If patient is a Neuro Trauma and  patient is going to OR before floor call report to [place] nurse: [phone] or [phone]  , Pulmonary Assessment Handoff:  Lung sounds:   O2 Device: Nasal Cannula O2 Flow Rate (L/min): 2 L/min    R Recommendations: See Admitting Provider Note  Report given to:   Additional Notes:

## 2022-09-25 NOTE — ED Provider Notes (Signed)
[facility] EMERGENCY DEPARTMENT Provider Note   CSN: [id number] Arrival date & time: 09/25/22  0043     History  Chief Complaint  Patient presents with   Back Pain    Daniel Cooke is a 88 y.o. male.  88 yo M here with altered mental status. Family and caregiver with him who state he usually gets like this with an infection. Recently had diverticulitis. Now with some diarrhea. No longer on Augmentin. Was fine yesterday. Today his personality is much more mean and not like himself. Has diffuse generalized weakness. No cough. No n/v. Has had increased urination and has had UTI's in the past. No fever.    Back Pain      Home Medications Prior to Admission medications   Medication Sig Start Date End Date Taking? Authorizing Provider  acetaminophen (TYLENOL) 500 MG tablet Take 500 mg by mouth 2 (two) times daily.    [provider]  amLODipine (NORVASC) 2.5 MG tablet Take 2.5 mg by mouth daily. 05/06/21   [provider]  amoxicillin-clavulanate (AUGMENTIN) 500-125 MG tablet Take 1 tablet (500 mg total) by mouth every 12 (twelve) hours for 10 days. 09/15/22 09/25/22  [place], Alvira Philips, DO  apixaban (ELIQUIS) 2.5 MG TABS tablet Take 1 tablet by mouth twice daily 07/01/22   Camnitz, Andree Coss, MD  CALCIUM PO Take 600 mg by mouth daily.    [provider]  carboxymethylcellulose (REFRESH PLUS) 0.5 % SOLN Place 1 drop into both eyes daily as needed (dry eyes).    [provider]  Cholecalciferol (VITAMIN D3 PO) Take 1,000 Units by mouth daily.    [provider]  Continuous Blood Gluc Receiver (FREESTYLE LIBRE 14 DAY READER) DEVI See admin instructions. 12/01/19   [provider]  finasteride (PROSCAR) 5 MG tablet Take 5 mg by mouth daily. 05/01/19   [provider]  furosemide (LASIX) 40 MG tablet Take 1/2 (one-half) tablet by mouth once daily 09/16/22   Camnitz, Andree Coss, MD  HUMALOG KWIKPEN 100 UNIT/ML  KwikPen Inject 2 Units into the skin daily with supper.  12/02/18   [provider]  Insulin Glargine (LANTUS SOLOSTAR) 100 UNIT/ML Solostar Pen Inject 4 Units into the skin at bedtime. Patient taking differently: Inject 12-16 Units into the skin at bedtime. 11/27/19   Lanae Boast, MD  Insulin Pen Needle (BD PEN NEEDLE NANO U/F) 32G X 4 MM MISC USE AS DIRECTED ONCE DAILY FOR 30 DAYS 10/28/18   [provider]  MYRBETRIQ 50 MG TB24 tablet Take 50 mg by mouth at bedtime. 03/14/21   [provider]  omeprazole (PRILOSEC) 20 MG capsule Take 20 mg by mouth every morning. 08/13/22   [provider]  simvastatin (ZOCOR) 20 MG tablet Take 20 mg by mouth at bedtime.    [provider]  traZODone (DESYREL) 50 MG tablet Take 50 mg by mouth at bedtime. 12/04/19   [provider]      Allergies    Codeine, Oxycodone, Nsaids, Other, Oxybutynin, and Tramadol hcl    Review of Systems   Review of Systems  Musculoskeletal:  Positive for back pain.    Physical Exam Updated Vital Signs BP (!) 119/41   Pulse 61   Temp 99.4 F (37.4 C) (Oral)   Resp 13   SpO2 96%  Physical Exam Vitals and nursing note reviewed.  Constitutional:      Appearance: He is well-developed.  HENT:     Head: Normocephalic  and atraumatic.     Nose: No congestion or rhinorrhea.     Mouth/Throat:     Mouth: Mucous membranes are dry.  Eyes:     Pupils: Pupils are equal, round, and reactive to light.  Cardiovascular:     Rate and Rhythm: Normal rate.  Pulmonary:     Effort: Pulmonary effort is normal. No respiratory distress.  Abdominal:     General: Abdomen is flat. There is no distension.     Palpations: Abdomen is soft.     Tenderness: There is no abdominal tenderness.  Musculoskeletal:        General: Normal range of motion.     Cervical back: Normal range of motion.  Skin:    General: Skin is warm and dry.  Neurological:     General: No focal deficit present.      Mental Status: He is alert.     Motor: Weakness (generalized, not focal) present.     Comments: Oriented to self, time and situation.      ED Results / Procedures / Treatments   Labs (all labs ordered are listed, but only abnormal results are displayed) Labs Reviewed  C DIFFICILE QUICK SCREEN W PCR REFLEX   - Abnormal; Notable for the following components:      Result Value   C Diff antigen POSITIVE (*)    C Diff toxin POSITIVE (*)    All other components within normal limits  CBC WITH DIFFERENTIAL/PLATELET - Abnormal; Notable for the following components:   WBC 15.2 (*)    RBC 3.25 (*)    Hemoglobin 10.5 (*)    HCT 32.6 (*)    MCV 100.3 (*)    Neutro Abs 13.4 (*)    Abs Immature Granulocytes 0.09 (*)    All other components within normal limits  COMPREHENSIVE METABOLIC PANEL - Abnormal; Notable for the following components:   Glucose, Bld 273 (*)    BUN 40 (*)    Creatinine, Ser 1.92 (*)    Total Protein 6.2 (*)    Total Bilirubin 2.3 (*)    GFR, Estimated 31 (*)    All other components within normal limits  TROPONIN I (HIGH SENSITIVITY) - Abnormal; Notable for the following components:   Troponin I (High Sensitivity) 53 (*)    All other components within normal limits  GASTROINTESTINAL PANEL BY PCR, STOOL (REPLACES STOOL CULTURE)  MAGNESIUM  URINALYSIS, ROUTINE W REFLEX MICROSCOPIC  TROPONIN I (HIGH SENSITIVITY)  TROPONIN I (HIGH SENSITIVITY)    EKG EKG Interpretation  Date/Time:  Friday September 25 2022 00:52:34 EDT Ventricular Rate:  66 PR Interval:    QRS Duration: 93 QT Interval:  396 QTC Calculation: 415 R Axis:   70 Text Interpretation: Atrial fibrillation Ventricular premature complex Minimal ST depression, lateral leads Confirmed by Marily Memos [phone]) on 09/25/2022 6:12:57 AM  Radiology No results found.  Procedures .Critical Care  Performed by: Marily Memos, MD Authorized by: Marily Memos, MD   Critical care provider statement:    Critical  care time (minutes):  30   Critical care was necessary to treat or prevent imminent or life-threatening deterioration of the following conditions:  CNS failure or compromise   Critical care was time spent personally by me on the following activities:  Development of treatment plan with patient or surrogate, discussions with consultants, evaluation of patient's response to treatment, examination of patient, ordering and review of laboratory studies, ordering and review of radiographic studies, ordering and performing treatments and  interventions, pulse oximetry, re-evaluation of patient's condition and review of old charts     Medications Ordered in ED Medications  vancomycin (VANCOCIN) capsule 125 mg (has no administration in time range)  sterile water (preservative free) injection (  Not Given 09/25/22 0428)  LORazepam (ATIVAN) injection 1 mg (has no administration in time range)  lactated ringers bolus 1,000 mL (0 mLs Intravenous Stopped 09/25/22 0347)  haloperidol lactate (HALDOL) injection 5 mg (5 mg Intravenous Given 09/25/22 0347)  ziprasidone (GEODON) injection 10 mg (10 mg Intramuscular Given 09/25/22 0412)  sterile water (preservative free) injection (10 mLs  Given 09/25/22 0428)  ziprasidone (GEODON) injection 10 mg (10 mg Intramuscular Given 09/25/22 0428)    ED Course/ Medical Decision Making/ A&P Clinical Course as of 09/25/22 2309  Fri Sep 25, 2485  4856 83 year old boyfriend AOx4 and quite functional.  Recent diverticulitis treated with augmentin now presenting with change in mental status with increased aggression and generalized fatigue and weakness per family acts this way when infection. He has had increased nonbloody diarrhea.  He has prerenal AKI with leukocytosis and left shift with positive C. difficile and likely demand ischemia. Admit after CTAP to ensure no toxic megacolon.  [VB]  0800 CT scan showed evidence of diverticulitis with increased inflammatory changes.  No  toxic megacolon.  Paging for admission for C. difficile colitis. [VB]  0821 Discussed case with Dr. Tamala Julian who will be down to evaluate the patient put in orders for admission. [VB]    Clinical Course User Index [VB] Elgie Congo, MD                           Medical Decision Making Amount and/or Complexity of Data Reviewed Labs: ordered. Radiology: ordered. ECG/medicine tests: ordered.  Risk Prescription drug management. Decision regarding hospitalization.   Suspect likely infectious versus metabolic encephalopathy based on family's report.  Basic labs will be checked along with fluids.  Labs show AKI with leukocytosis.  Patient becoming persistently more altered and not wanting to participate in medical care.  Mittens applied.  Sedation provided.  Still pending CT scan.  Care transferred pending CT results.  Anticipate admission based on altered mental status related to C. difficile.  Oral Vanc already ordered.   Final Clinical Impression(s) / ED Diagnoses Final diagnoses:  None    Rx / DC Orders ED Discharge Orders     None         Meko Bellanger, Corene Cornea, MD 09/25/22 2311

## 2022-09-25 NOTE — ED Notes (Signed)
Pt currently trying to pull mittens off and trying to forcibly pull on his restraints.  Pt redirected and oriented to place and situation.

## 2022-09-25 NOTE — ED Notes (Signed)
Pt was cleaned up and changed his bed was changed as well.

## 2022-09-25 NOTE — TOC Benefit Eligibility Note (Signed)
Patient Teacher, English as a foreign language completed.    The patient is currently admitted and upon discharge could be taking Dificid 200 mg tablets.  The current 10 day co-pay is $237.54.   The patient is currently admitted and upon discharge could be taking Vancomycin 125 mc capsules.  The current 10 day co-pay is $12.62   The patient is insured through [place], [place] Patient Advocate Specialist [place] Patient Advocate Team Direct Number: [phone]  Fax: [phone]

## 2022-09-25 NOTE — ED Triage Notes (Signed)
Pt BIB [facility] from home, family reports pt had urinary incontinence episode tonight while trying to get to the bathroom, pt became agitated and restless and caregiver reports pt became combative, which is unusual for pt. Pt c/o back pain, no known fall. Family reports pt has "memory issues" at times. EMS VS: BP 138/58, HR 68, CBG 344

## 2022-09-25 NOTE — ED Notes (Signed)
Daughter and caregiver at bedside.

## 2022-09-25 NOTE — ED Notes (Signed)
Pt remains confused and agitated, continually attempts to get out of bed and is not redirectable. MD at bedside and new orders given. Family remains at bedside during this time.

## 2022-09-26 DIAGNOSIS — A0472 Enterocolitis due to Clostridium difficile, not specified as recurrent: Secondary | ICD-10-CM | POA: Diagnosis not present

## 2022-09-26 DIAGNOSIS — G9341 Metabolic encephalopathy: Secondary | ICD-10-CM | POA: Diagnosis not present

## 2022-09-26 DIAGNOSIS — S22000A Wedge compression fracture of unspecified thoracic vertebra, initial encounter for closed fracture: Secondary | ICD-10-CM | POA: Diagnosis not present

## 2022-09-26 DIAGNOSIS — D638 Anemia in other chronic diseases classified elsewhere: Secondary | ICD-10-CM | POA: Diagnosis not present

## 2022-09-26 LAB — CBC
HCT: 33.1 % — ABNORMAL LOW (ref 39.0–52.0)
Hemoglobin: 10.8 g/dL — ABNORMAL LOW (ref 13.0–17.0)
MCH: 32.4 pg (ref 26.0–34.0)
MCHC: 32.6 g/dL (ref 30.0–36.0)
MCV: 99.4 fL (ref 80.0–100.0)
Platelets: 163 10*3/uL (ref 150–400)
RBC: 3.33 MIL/uL — ABNORMAL LOW (ref 4.22–5.81)
RDW: 13.4 % (ref 11.5–15.5)
WBC: 17.3 10*3/uL — ABNORMAL HIGH (ref 4.0–10.5)
nRBC: 0 % (ref 0.0–0.2)

## 2022-09-26 LAB — LACTIC ACID, PLASMA
Lactic Acid, Venous: 3.3 mmol/L (ref 0.5–1.9)
Lactic Acid, Venous: 2.5 mmol/L (ref 0.5–1.9)

## 2022-09-26 LAB — BASIC METABOLIC PANEL
Anion gap: 12 (ref 5–15)
BUN: 36 mg/dL — ABNORMAL HIGH (ref 8–23)
CO2: 22 mmol/L (ref 22–32)
Calcium: 9.8 mg/dL (ref 8.9–10.3)
Chloride: 107 mmol/L (ref 98–111)
Creatinine, Ser: 1.92 mg/dL — ABNORMAL HIGH (ref 0.61–1.24)
GFR, Estimated: 31 mL/min — ABNORMAL LOW (ref >60)
Glucose, Bld: 180 mg/dL — ABNORMAL HIGH (ref 70–99)
Potassium: 4.2 mmol/L (ref 3.5–5.1)
Sodium: 141 mmol/L (ref 135–145)

## 2022-09-26 LAB — GLUCOSE, CAPILLARY
Glucose-Capillary: 180 mg/dL — ABNORMAL HIGH (ref 70–99)
Glucose-Capillary: 200 mg/dL — ABNORMAL HIGH (ref 70–99)
Glucose-Capillary: 238 mg/dL — ABNORMAL HIGH (ref 70–99)
Glucose-Capillary: 218 mg/dL — ABNORMAL HIGH (ref 70–99)

## 2022-09-26 MED ORDER — ORAL CARE MOUTH RINSE
15.0000 mL | OROMUCOSAL | Status: DC
  Administered 2022-09-26 – 2022-09-28 (×8): 15 mL via OROMUCOSAL

## 2022-09-26 MED ORDER — ORAL CARE MOUTH RINSE: 15.0000 mL | OROMUCOSAL | Status: DC | PRN

## 2022-09-26 NOTE — Progress Notes (Addendum)
PROGRESS NOTE    Daniel Cooke  ALP:[id number] DOB: Sep 21, 1924 DOA: 09/25/2022 PCP: Glenis Smoker, MD   Brief Narrative: No notes on file   Assessment and Plan:  C. Difficile Colitis Possible secondary to recent Augmentin. Associated diarrhea and abdominal pain. Positive C. Difficile antigen and toxin. Started on Vancomycin PO for treatment. -Continue Vancomycin PO  Diverticulitis Previously treated but persistent diverticulitis noted on imaging. Possibly related to C. Difficile infection. Started on Flagyl on admission. -Continue Flagyl  Sepsis Present on admission. Secondary to C. Difficile colitis. No blood cultures on admission. Afebrile.  Acute metabolic encephalopathy Likely related to infection -Supportive care  CKD stage IIIb Baseline hemoglobin of about 1.8-2. Currently stable.  Persistent atrial fibrillation Stable rate. Patient is on Eliquis for stroke prevention. -Continue Eliquis  Elevated troponin Mildly elevated with negative delta. No chest pain. Possible lateral ST depressions noted on initial EKG. -Repeat EKG  Diabetes mellitus type 2 Uncontrolled with hyperglycemia and a hemoglobin A1C of 8.3%. -Continue SSI  Primary hypertension -Continue amlodipine  Anemia of chronic disease Currently at baseline hemoglobin of around 10.   Hyperlipidemia -Continue simvastatin  Chronic compression fractures Located at T12 and L5.  GERD -Continue Protonix   DVT prophylaxis: Eliquis Code Status:   Code Status: Full Code Family Communication: Daughter on telephone Disposition Plan: Discharge pending improvement of diarrheal symptoms and encephalopathy. Anticipate possible need for SNF on discharge.   Consultants:  None  Procedures:  None  Antimicrobials: Vancomycin PO Flagyl    Subjective: Patient confused but also seems to have significant trouble hearing, making him unable to answer questions appropriately.  Objective: BP  111/63 (BP Location: Left Arm)   Pulse 77   Temp 98.4 F (36.9 C) (Axillary)   Resp (!) 21   SpO2 93%   Examination:  General exam: Appears calm and comfortable Respiratory system: Clear to auscultation. Respiratory effort normal. Cardiovascular system: S1 & S2 heard, irregular rhythm, normal rate. Gastrointestinal system: Abdomen is nondistended, soft and generally tender. Normal bowel sounds heard. Central nervous system: Alert. Unable to assess orientation. Musculoskeletal: No edema. No calf tenderness Skin: No cyanosis. No rashes   Data Reviewed: I have personally reviewed following labs and imaging studies  CBC Lab Results  Component Value Date   WBC 17.3 (H) 09/26/2022   RBC 3.33 (L) 09/26/2022   HGB 10.8 (L) 09/26/2022   HCT 33.1 (L) 09/26/2022   MCV 99.4 09/26/2022   MCH 32.4 09/26/2022   PLT 163 09/26/2022   MCHC 32.6 09/26/2022   RDW 13.4 09/26/2022   LYMPHSABS 1.1 09/25/2022   MONOABS 0.5 09/25/2022   EOSABS 0.0 09/25/2022   BASOSABS 0.1 35/32/9924     Last metabolic panel Lab Results  Component Value Date   NA 141 09/26/2022   K 4.2 09/26/2022   CL 107 09/26/2022   CO2 22 09/26/2022   BUN 36 (H) 09/26/2022   CREATININE 1.92 (H) 09/26/2022   GLUCOSE 180 (H) 09/26/2022   GFRNONAA 31 (L) 09/26/2022   GFRAA 46 (L) 11/27/2019   CALCIUM 9.8 09/26/2022   PROT 6.2 (L) 09/25/2022   ALBUMIN 3.5 09/25/2022   BILITOT 2.3 (H) 09/25/2022   ALKPHOS 75 09/25/2022   AST 31 09/25/2022   ALT 29 09/25/2022   ANIONGAP 12 09/26/2022    GFR: CrCl cannot be calculated (Unknown ideal weight.).  Recent Results (from the past 240 hour(s))  C Difficile Quick Screen w PCR reflex     Status: Abnormal   Collection  Time: 09/25/22  1:35 AM   Specimen: STOOL  Result Value Ref Range Status   C Diff antigen POSITIVE (A) NEGATIVE Final   C Diff toxin POSITIVE (A) NEGATIVE Final   C Diff interpretation Toxin producing C. difficile detected.  Final    Comment: CRITICAL  RESULTS CALLED TO, READ BACK BY AND VERIFIED WITH RN K.OWENS @0306  ON 09/25/2022 BY NM Performed at [facility], [place] [address] [place], [place] [address]   Gastrointestinal Panel by PCR , Stool     Status: None   Collection Time: 09/25/22  1:35 AM   Specimen: STOOL  Result Value Ref Range Status   Campylobacter species NOT DETECTED NOT DETECTED Final   Plesimonas shigelloides NOT DETECTED NOT DETECTED Final   Salmonella species NOT DETECTED NOT DETECTED Final   Yersinia enterocolitica NOT DETECTED NOT DETECTED Final   Vibrio species NOT DETECTED NOT DETECTED Final   Vibrio cholerae NOT DETECTED NOT DETECTED Final   Enteroaggregative E coli (EAEC) NOT DETECTED NOT DETECTED Final   Enteropathogenic E coli (EPEC) NOT DETECTED NOT DETECTED Final   Enterotoxigenic E coli (ETEC) NOT DETECTED NOT DETECTED Final   Shiga like toxin producing E coli (STEC) NOT DETECTED NOT DETECTED Final   Shigella/Enteroinvasive E coli (EIEC) NOT DETECTED NOT DETECTED Final   Cryptosporidium NOT DETECTED NOT DETECTED Final   Cyclospora cayetanensis NOT DETECTED NOT DETECTED Final   Entamoeba histolytica NOT DETECTED NOT DETECTED Final   Giardia lamblia NOT DETECTED NOT DETECTED Final   Adenovirus F40/41 NOT DETECTED NOT DETECTED Final   Astrovirus NOT DETECTED NOT DETECTED Final   Norovirus GI/GII NOT DETECTED NOT DETECTED Final   Rotavirus A NOT DETECTED NOT DETECTED Final   Sapovirus (I, II, IV, and V) NOT DETECTED NOT DETECTED Final    Comment: Performed at [facility], [address], [place], [place] [address]      Radiology Studies: CT ABDOMEN PELVIS W CONTRAST  Result Date: 09/25/2022 CLINICAL DATA:  Diverticulitis, complication suspected. New onset of urinary incontinence. EXAM: CT ABDOMEN AND PELVIS WITH CONTRAST TECHNIQUE: Multidetector CT imaging of the abdomen and pelvis was performed using the standard protocol following bolus administration of intravenous contrast.  RADIATION DOSE REDUCTION: This exam was performed according to the departmental dose-optimization program which includes automated exposure control, adjustment of the mA and/or kV according to patient size and/or use of iterative reconstruction technique. CONTRAST:  39mL OMNIPAQUE IOHEXOL 350 MG/ML SOLN COMPARISON:  CT of the abdomen and pelvis without contrast 09/12/2022 and 09/10/2022 FINDINGS: Lower chest: Mild dependent atelectasis and small effusions have increased slightly. Heart size is normal. Coronary artery calcifications are present. Hepatobiliary: No focal liver abnormality is seen. Status post cholecystectomy. No biliary dilatation. Pancreas: Unremarkable. No pancreatic ductal dilatation or surrounding inflammatory changes. Spleen: Normal in size without focal abnormality. Adrenals/Urinary Tract: Adrenal glands are normal bilaterally. Thinning of the renal parenchyma noted. 15 mm simple cyst is present at the lower pole of the left kidney. No other focal lesions are present. No obstruction is present. Ureters are within normal limits. Right-sided bladder diverticuli are stable. Urinary bladder is otherwise within normal limits. Stomach/Bowel: The stomach and duodenum are within normal limits. Small bowel is unremarkable. Terminal ileum is normal. The appendix is not discretely visualized and may be surgically absent. Ascending and transverse colon are within normal limits. Descending colon is unremarkable. Inflammatory changes surrounding the sigmoid colon have increased since the prior exam. No free fluid or free air is present. A  rectal catheter is in place. Vascular/Lymphatic: Atherosclerotic calcifications are present in the abdominal aorta and branch vessels without aneurysm. No significant adenopathy is present. Reproductive: Prostate is unremarkable. Other: No abdominal wall hernia or abnormality. No abdominopelvic ascites. Musculoskeletal: Compression fractures at T12 and L5 are stable. Spinal  augmentation noted at L1 and L4. No acute abnormalities are present. IMPRESSION: 1. Increasing inflammatory changes surrounding the sigmoid colon compatible with acute diverticulitis. No complicating features are present. 2. Stable right-sided bladder diverticuli. 3. Stable compression fractures at T12 and L5. 4. Coronary artery disease. 5.  Aortic Atherosclerosis (ICD10-I70.0). Electronically Signed   By: San Morelle M.D.   On: 09/25/2022 07:47   CT Head Wo Contrast  Result Date: 09/25/2022 CLINICAL DATA:  Altered mental status. New onset urinary incontinence. EXAM: CT HEAD WITHOUT CONTRAST TECHNIQUE: Contiguous axial images were obtained from the base of the skull through the vertex without intravenous contrast. RADIATION DOSE REDUCTION: This exam was performed according to the departmental dose-optimization program which includes automated exposure control, adjustment of the mA and/or kV according to patient size and/or use of iterative reconstruction technique. COMPARISON:  None Available. FINDINGS: Brain: Mild atrophy and white matter changes are within normal limits for age. No acute infarct, hemorrhage, or mass lesion is present. Deep brain nuclei are within normal limits. The ventricles are proportionate to the degree of atrophy. No significant extraaxial fluid collection is present. The brainstem and cerebellum are within normal limits. Vascular: Atherosclerotic calcifications are present within the cavernous internal carotid arteries. No hyperdense vessel is present. Skull: No significant extracranial soft tissue lesion is present. Calvarium is intact. No focal lytic or blastic lesions are present. Sinuses/Orbits: Right frontal and anterior ethmoid air cell disease appears chronic. Mild mucosal thickening is present in the anterior ethmoid air cells bilaterally. Maxillary sinuses and sphenoid sinuses are clear. Mastoid air cells are clear. Right-sided scleral banding is noted. Bilateral lens  replacements are present. Globes and orbits are otherwise within normal limits. IMPRESSION: 1. Normal CT appearance of the brain for age. 2. Right frontal and anterior ethmoid air cell disease appears chronic. Electronically Signed   By: San Morelle M.D.   On: 09/25/2022 07:40      LOS: 1 day    Cordelia Poche, MD [facility] 09/26/2022, 5:28 PM   If 7PM-7AM, please contact night-coverage [email or web address]

## 2022-09-27 ENCOUNTER — Inpatient Hospital Stay (HOSPITAL_COMMUNITY): Payer: Medicare Other

## 2022-09-27 DIAGNOSIS — D638 Anemia in other chronic diseases classified elsewhere: Secondary | ICD-10-CM | POA: Diagnosis not present

## 2022-09-27 DIAGNOSIS — A0472 Enterocolitis due to Clostridium difficile, not specified as recurrent: Secondary | ICD-10-CM | POA: Diagnosis not present

## 2022-09-27 DIAGNOSIS — S22000A Wedge compression fracture of unspecified thoracic vertebra, initial encounter for closed fracture: Secondary | ICD-10-CM | POA: Diagnosis not present

## 2022-09-27 DIAGNOSIS — G9341 Metabolic encephalopathy: Secondary | ICD-10-CM | POA: Diagnosis not present

## 2022-09-27 LAB — URINALYSIS, ROUTINE W REFLEX MICROSCOPIC
Bilirubin Urine: NEGATIVE
Glucose, UA: NEGATIVE mg/dL
Ketones, ur: NEGATIVE mg/dL
Nitrite: NEGATIVE
Protein, ur: 100 mg/dL — AB
Specific Gravity, Urine: 1.023 (ref 1.005–1.030)
pH: 5 (ref 5.0–8.0)

## 2022-09-27 LAB — CBC
HCT: 32.7 % — ABNORMAL LOW (ref 39.0–52.0)
Hemoglobin: 10.5 g/dL — ABNORMAL LOW (ref 13.0–17.0)
MCH: 32.2 pg (ref 26.0–34.0)
MCHC: 32.1 g/dL (ref 30.0–36.0)
MCV: 100.3 fL — ABNORMAL HIGH (ref 80.0–100.0)
Platelets: 175 10*3/uL (ref 150–400)
RBC: 3.26 MIL/uL — ABNORMAL LOW (ref 4.22–5.81)
RDW: 13.5 % (ref 11.5–15.5)
WBC: 17.2 10*3/uL — ABNORMAL HIGH (ref 4.0–10.5)
nRBC: 0 % (ref 0.0–0.2)

## 2022-09-27 LAB — BASIC METABOLIC PANEL
Anion gap: 9 (ref 5–15)
BUN: 58 mg/dL — ABNORMAL HIGH (ref 8–23)
CO2: 21 mmol/L — ABNORMAL LOW (ref 22–32)
Calcium: 9.3 mg/dL (ref 8.9–10.3)
Chloride: 108 mmol/L (ref 98–111)
Creatinine, Ser: 2.22 mg/dL — ABNORMAL HIGH (ref 0.61–1.24)
GFR, Estimated: 26 mL/min — ABNORMAL LOW (ref >60)
Glucose, Bld: 224 mg/dL — ABNORMAL HIGH (ref 70–99)
Potassium: 4.3 mmol/L (ref 3.5–5.1)
Sodium: 138 mmol/L (ref 135–145)

## 2022-09-27 LAB — PROCALCITONIN: Procalcitonin: 0.62 ng/mL

## 2022-09-27 LAB — GLUCOSE, CAPILLARY
Glucose-Capillary: 198 mg/dL — ABNORMAL HIGH (ref 70–99)
Glucose-Capillary: 196 mg/dL — ABNORMAL HIGH (ref 70–99)
Glucose-Capillary: 146 mg/dL — ABNORMAL HIGH (ref 70–99)
Glucose-Capillary: 134 mg/dL — ABNORMAL HIGH (ref 70–99)

## 2022-09-27 LAB — LACTIC ACID, PLASMA
Lactic Acid, Venous: 2.6 mmol/L (ref 0.5–1.9)
Lactic Acid, Venous: 2 mmol/L (ref 0.5–1.9)

## 2022-09-27 MED ORDER — HYDROMORPHONE HCL 1 MG/ML IJ SOLN
0.5000 mg | Freq: Once | INTRAMUSCULAR | Status: AC
  Administered 2022-09-27: 0.5 mg via INTRAVENOUS
  Filled 2022-09-27: qty 0.5

## 2022-09-27 MED ORDER — HYDROMORPHONE HCL 1 MG/ML IJ SOLN
0.5000 mg | INTRAMUSCULAR | Status: DC | PRN
  Administered 2022-09-27 – 2022-09-28 (×3): 0.5 mg via INTRAVENOUS
  Filled 2022-09-27 (×3): qty 0.5

## 2022-09-27 NOTE — Progress Notes (Signed)
Patient O2 sat 85% on RA. Tachypnea. Provided 2LNC and brought patient O2 sat to 89%. Currently patient sat 95% on 3L [place]. Will continue to monitor.  Daymon Larsen, RN

## 2022-09-27 NOTE — Evaluation (Signed)
Clinical/Bedside Swallow Evaluation Patient Details  Name: Daniel Cooke MRN: [id number] Date of Birth: Aug 29, 1924  Today's Date: 09/27/2022 Time: SLP Start Time (ACUTE ONLY): 54 SLP Stop Time (ACUTE ONLY): 1250 SLP Time Calculation (min) (ACUTE ONLY): 20 min  Past Medical History:  Past Medical History:  Diagnosis Date   Arthritis    "was in left knee before replacement; now have it in my right knee" (11/25/2016)   BPH (benign prostatic hypertrophy)    Chronic diastolic CHF (congestive heart failure) ([place])    a. 02/2016 Echo: EF 60-65%, no rwma, triv AI, mild MR, mildly to mod dil LA, mod dil RA, PASP 52mmHg.   Dysrhythmia    Essential hypertension    Hard of hearing    wears bilateral hearing aids   Hypercholesterolemia    Osteoarthritis    PAF (paroxysmal atrial fibrillation) ([place])    a. CHADS2VASC score of 4 --> coumadin.   Pilonidal cyst    PAST HX - NO PROBLEM NOW   Pneumonia    "one time; years ago" (11/25/2016)   Shingles    "long time ago"   Synovitis of knee 02/27/2014   Type II diabetes mellitus ([place])    oral meds - no insulin   Past Surgical History:  Past Surgical History:  Procedure Laterality Date   APPENDECTOMY     CARDIOVERSION N/A 02/04/2017   Procedure: CARDIOVERSION;  Surgeon: Lelon Perla, MD;  Location: [facility];  Service: Cardiovascular;  Laterality: N/A;   CATARACT EXTRACTION W/ INTRAOCULAR LENS IMPLANT Left 10/2016   CHOLECYSTECTOMY OPEN     COLONOSCOPY W/ BIOPSIES  08/2005   Archie Endo 04/27/2011   EYE SURGERY Right    "right" traumatic cataract removed--injury to the eye--states his eyesight is ok in left eye   JOINT REPLACEMENT     KNEE ARTHROSCOPY Left 02/28/2014   Procedure: ARTHROSCOPY LEFT KNEE WITH SYNOVECTOMY;  Surgeon: Gearlean Alf, MD;  Location: [facility];  Service: Orthopedics;  Laterality: Left;   KYPHOPLASTY  2017   KYPHOPLASTY N/A 05/28/2017   Procedure: Lumbar Four Kyphoplasty;  Surgeon: Erline Levine, MD;  Location: [facility];  Service: Neurosurgery;  Laterality: N/A;  L4 Kyphoplasty   SHOULDER OPEN ROTATOR CUFF REPAIR Left    TONSILLECTOMY     TOTAL KNEE ARTHROPLASTY  02/08/2012   Procedure: TOTAL KNEE ARTHROPLASTY;  Surgeon: Gearlean Alf, MD;  Location: [facility];  Service: Orthopedics;  Laterality: Left;   VASECTOMY     HPI:  Patient is a 88 y.o. male with PMH: HTN, persistent a-fib, CKD stage IIIb, DM-2, osteoarthritis, HOH, "mild memory issues" per report. He presented to the hospital on 09/25/22 from home due to being acutely altered since previous day. He had recently been admitted 9/30-10/3/23 with acute diverticulitis of the proximal sigmoid colon without signs of perforation or abscess and acute kidney failure. Upon this admission, patient afebrile with tachypnea, CT scan of head negative for acute abnormality, UA noted large hemoglobin, rare bacteria, C. difficile antigen positive and toxin positive.    Assessment / Plan / Recommendation  Clinical Impression  Patient currently presnting with a likely cognitive-based dysphagia however today's assessment was limited by patient's lethargy and inability to maintain adeuqate alertness for full bedside/clinical swallow evaluation. SLP performed oral care with patient initially not following commands vs refusing to follow but this improved and patient would then open mouth slightly for oral care. After oral care, patient receptive to very small pieces of ice which were presented in the  anterior portion of mouth. Patient did exhibit some mastication of ice but this was followed by delayed swallow initiation. In addition, patient then requiring oral suction to remove small amount of secetions in oral cavity. SLP unable to make adequate PO recommendations from this very limited evaluation, but recommending continue with Dys 1 (puree) solids, thin liquids and feed patient only when fully awake and alert.  SLP will follow for diet toleration and ability to upgrade with solid  textures. SLP Visit Diagnosis: Dysphagia, unspecified (R13.10)    Aspiration Risk  Mild aspiration risk    Diet Recommendation Dysphagia 1 (Puree);Thin liquid   Medication Administration: Whole meds with puree Supervision: Full supervision/cueing for compensatory strategies Compensations: Slow rate;Small sips/bites;Minimize environmental distractions    Other  Recommendations Oral Care Recommendations: Oral care BID;Oral care before and after PO;Staff/trained caregiver to provide oral care    Recommendations for follow up therapy are one component of a multi-disciplinary discharge planning process, led by the attending physician.  Recommendations may be updated based on patient status, additional functional criteria and insurance authorization.  Follow up Recommendations Follow physician's recommendations for discharge plan and follow up therapies      Assistance Recommended at Discharge Frequent or constant Supervision/Assistance  Functional Status Assessment Patient has had a recent decline in their functional status and demonstrates the ability to make significant improvements in function in a reasonable and predictable amount of time.  Frequency and Duration min 2x/week  1 week       Prognosis Prognosis for Safe Diet Advancement: Good Barriers to Reach Goals: Cognitive deficits      Swallow Study   General Date of Onset: 09/25/22 HPI: Patient is a 88 y.o. male with PMH: HTN, persistent a-fib, CKD stage IIIb, DM-2, osteoarthritis, HOH, "mild memory issues" per report. He presented to the hospital on 09/25/22 from home due to being acutely altered since previous day. He had recently been admitted 9/30-10/3/23 with acute diverticulitis of the proximal sigmoid colon without signs of perforation or abscess and acute kidney failure. Upon this admission, patient afebrile with tachypnea, CT scan of head negative for acute abnormality, UA noted large hemoglobin, rare bacteria, C.  difficile antigen positive and toxin positive. Type of Study: Bedside Swallow Evaluation Previous Swallow Assessment: none found Diet Prior to this Study: Thin liquids;Dysphagia 1 (puree) Temperature Spikes Noted: No Respiratory Status: Nasal cannula History of Recent Intubation: No Behavior/Cognition: Cooperative;Confused;Lethargic/Drowsy;Distractible;Requires cueing;Doesn't follow directions Oral Cavity Assessment: Dry;Dried secretions Oral Care Completed by SLP: Yes Oral Cavity - Dentition: Adequate natural dentition Self-Feeding Abilities: Total assist Patient Positioning: Upright in bed Baseline Vocal Quality: Low vocal intensity Volitional Cough: Cognitively unable to elicit Volitional Swallow: Unable to elicit    Oral/Motor/Sensory Function Overall Oral Motor/Sensory Function: Other (comment) (unable to participate but no overt s/s of oral motor weakness or other deficits)   Ice Chips Ice chips: Impaired Oral Phase Impairments: Poor awareness of bolus Oral Phase Functional Implications: Prolonged oral transit   Thin Liquid Thin Liquid: Not tested Other Comments: Patient not able to drink via straw    Nectar Thick Nectar Thick Liquid: Not tested   Honey Thick Honey Thick Liquid: Not tested   Puree Puree: Not tested   Solid     Solid: Not tested     Sonia Baller, MA, CCC-SLP Speech Therapy

## 2022-09-27 NOTE — Evaluation (Signed)
Physical Therapy Evaluation Patient Details Name: Daniel Cooke MRN: [id number] DOB: 02-09-1924 Today's Date: 09/27/2022  History of Present Illness  Pt is a 88 y.o. male who presented 09/25/22 with AMS and weakness. CT scan of the head noted no acute abnormality. Pt hospitalized 9/30-10/3 with acute diverticulitis of the proximal sigmoid colon without signs of perforation or abscess and acute kidney failure. This admission, CT scan of the abdomen and pelvis noted increasing inflammatory changes surrounding the sigmoid colon and compatible with acute diverticulitis and stable pressure fracture of T12/L5. Admitted with C. diff, leukocystosis, and acute metabolic encephalopathy. PMH: DM2, afib on Eliquis, CKD stage IIIb, HTN, CHF   Clinical Impression  Pt presents with condition above and deficits mentioned below, see PT Problem List. Per entry from 09/14/22, pt was living with family in a 1-level house with 5 STE and has a 24/7 caregiver to assist with him with mobility using a RW and all ADL's. Currently, pt is limited by low level of arousal and impaired cognition, not following any commands and only opening his eyes slightly once sitting EOB. Pt is restless, often trying to pull his mittens off. He demonstrates deficits in static and dynamic balance (leans to L), overall strength, cognition, and activity tolerance. Due to his low level of arousal and impaired cognition, he required max-TA for bed mobility. At this time recommending short-term rehab at a SNF. However, if pt's arousal and ability to participate improves to a level that his family can manage, then may be able to update his d/c plan to HHPT. Will continue to follow acutely.     Recommendations for follow up therapy are one component of a multi-disciplinary discharge planning process, led by the attending physician.  Recommendations may be updated based on patient status, additional functional criteria and insurance  authorization.  Follow Up Recommendations Skilled nursing-short term rehab (<3 hours/day) (pending progress) Can patient physically be transported by private vehicle: No    Assistance Recommended at Discharge Frequent or constant Supervision/Assistance  Patient can return home with the following  Two people to help with walking and/or transfers;Two people to help with bathing/dressing/bathroom;Assistance with cooking/housework;Assistance with feeding;Direct supervision/assist for medications management;Direct supervision/assist for financial management;Assist for transportation;Help with stairs or ramp for entrance    Equipment Recommendations Hospital bed;Other (comment) (hoyer lift; pending progress)  Recommendations for Other Services       Functional Status Assessment Patient has had a recent decline in their functional status and demonstrates the ability to make significant improvements in function in a reasonable and predictable amount of time.     Precautions / Restrictions Precautions Precautions: Fall Precaution Comments: bil mittens, watch vitals, enteric precautions, flexiseal Restrictions Weight Bearing Restrictions: No      Mobility  Bed Mobility Overal bed mobility: Needs Assistance Bed Mobility: Supine to Sit, Sit to Supine     Supine to sit: Max assist, HOB elevated Sit to supine: Total assist, HOB elevated   General bed mobility comments: MaxA to direct legs off EOB and ascend trunk, once initiated pt appeared to activate his core and sat up. TA to return to supine, pt actively resisting.    Transfers                   General transfer comment: deferred due to low level of arousal and not following commands    Ambulation/Gait               General Gait Details: deferred  Stairs  Wheelchair Mobility    Modified Rankin (Stroke Patients Only)       Balance Overall balance assessment: Needs assistance Sitting-balance  support: Feet supported Sitting balance-Leahy Scale: Fair Sitting balance - Comments: Leans to L, moments of min guard but needs up to modA at times due to L lean and poor arousal/no command following Postural control: Left lateral lean     Standing balance comment: deferred                             Pertinent Vitals/Pain Pain Assessment Pain Assessment: Faces Faces Pain Scale: Hurts even more Pain Location: generalized with mobility Pain Descriptors / Indicators: Grimacing, Moaning Pain Intervention(s): Limited activity within patient's tolerance, Monitored during session, Repositioned    Home Living Family/patient expects to be discharged to:: Private residence (all info carried over from prior entry 09/14/22, pt unable to respond and no family present at this time) Living Arrangements: Children Available Help at Discharge: Personal care attendant;Family;Available 24 hours/day Type of Home: House Home Access: Stairs to enter Entrance Stairs-Rails: Can reach both Entrance Stairs-Number of Steps: 5   Home Layout: One level Home Equipment: Conservation officer, nature (2 wheels);Cane - single point;Wheelchair - manual;BSC/3in1;Shower seat Additional Comments: "nelly" is his 24/7 caregiver to assist with him. - pt has assist for household mobility and all ADL's.    Prior Function Prior Level of Function : Needs assist (all info carried over from prior entry 09/14/22, pt unable to respond and no family present at this time)       Physical Assist : Mobility (physical);ADLs (physical) Mobility (physical): Gait;Stairs ADLs (physical): Bathing;Dressing;Toileting;IADLs Mobility Comments: walks with walker. has several in the home ADLs Comments: caregiver assists with whatever he needs by  mostly cooking, cleaning, bathing, dressing.     Hand Dominance   Dominant Hand: Right    Extremity/Trunk Assessment   Upper Extremity Assessment Upper Extremity Assessment: Defer to OT  evaluation    Lower Extremity Assessment Lower Extremity Assessment: Generalized weakness;Difficult to assess due to impaired cognition (moves sponatenously against gravity)    Cervical / Trunk Assessment Cervical / Trunk Assessment: Kyphotic  Communication   Communication: HOH  Cognition Arousal/Alertness: Lethargic Behavior During Therapy: Restless, Flat affect, Impulsive Overall Cognitive Status: Difficult to assess                                 General Comments: Pt keeping eyes closed majority of session, slightly opening them when sitting EOB. Pt not following any cues, but impulsive and restless, trying to pull his mittens off often.        General Comments      Exercises     Assessment/Plan    PT Assessment Patient needs continued PT services  PT Problem List Decreased strength;Decreased activity tolerance;Decreased balance;Decreased mobility;Decreased knowledge of use of DME;Decreased safety awareness;Decreased knowledge of precautions;Decreased cognition;Cardiopulmonary status limiting activity       PT Treatment Interventions DME instruction;Gait training;Stair training;Therapeutic exercise;Therapeutic activities;Functional mobility training;Balance training;Patient/family education;Neuromuscular re-education;Cognitive remediation;Wheelchair mobility training    PT Goals (Current goals can be found in the Care Plan section)  Acute Rehab PT Goals Patient Stated Goal: did not state PT Goal Formulation: Patient unable to participate in goal setting Time For Goal Achievement: 10/11/22 Potential to Achieve Goals: Fair    Frequency Min 3X/week     Co-evaluation  AM-PAC PT "6 Clicks" Mobility  Outcome Measure Help needed turning from your back to your side while in a flat bed without using bedrails?: A Lot Help needed moving from lying on your back to sitting on the side of a flat bed without using bedrails?: A Lot Help needed  moving to and from a bed to a chair (including a wheelchair)?: Total Help needed standing up from a chair using your arms (e.g., wheelchair or bedside chair)?: Total Help needed to walk in hospital room?: Total Help needed climbing 3-5 steps with a railing? : Total 6 Click Score: 8    End of Session Equipment Utilized During Treatment: Oxygen Activity Tolerance: Patient limited by lethargy;Other (comment) (limited by cognition) Patient left: in bed;with call bell/phone within reach;with bed alarm set;with restraints reapplied Nurse Communication: Mobility status PT Visit Diagnosis: Muscle weakness (generalized) (M62.81);Difficulty in walking, not elsewhere classified (R26.2);Unsteadiness on feet (R26.81)    Time: [id number] PT Time Calculation (min) (ACUTE ONLY): 18 min   Charges:   PT Evaluation $PT Eval Moderate Complexity: 1 Mod          Moishe Spice, PT, DPT Acute Rehabilitation Services  Office: [phone]   Orvan Falconer 09/27/2022, 2:28 PM

## 2022-09-27 NOTE — Progress Notes (Signed)
PROGRESS NOTE    Daniel Cooke  [id number] DOB: [birth year] DOA: 09/25/2022 PCP: Glenis Smoker, MD   Brief Narrative: Daniel Cooke is a 88 y.o. male with a history of hypertension, atrial fibrillation on Eliquis, CKD stage IIIb, diabetes mellitus and BPH. Patient presented secondary to altered mental status and found to have evidence of C. Difficile colitis and persistent diverticulitis meeting sepsis criteria on admission. Antibiotics initiated.   Assessment and Plan:  C. Difficile Colitis Possible secondary to recent Augmentin. Associated diarrhea and abdominal pain. Positive C. Difficile antigen and toxin. Started on Vancomycin PO for treatment. -Continue Vancomycin PO -Repeat CT abdomen/pelvis to rule out development of abscess secondary to fevers and worsening pain -Blood cultures, lactic acid, procalcitonin  Diverticulitis Previously treated but persistent diverticulitis noted on imaging. Possibly related to C. Difficile infection. Started on Flagyl on admission. -Continue Flagyl  Sepsis Present on admission. Secondary to C. Difficile colitis. No blood cultures on admission. Afebrile.  Acute metabolic encephalopathy Likely related to infection -Supportive care  CKD stage IIIb Baseline hemoglobin of about 1.8-2. Currently worsened slightly. -BMP in AM  Persistent atrial fibrillation Stable rate. Patient is on Eliquis for stroke prevention. -Continue Eliquis  Elevated troponin Likely demand ischemia in setting of sepsis. Mildly elevated with negative delta. No chest pain. Possible lateral ST depressions noted on initial EKG which are not present on repeat EKG.  Diabetes mellitus type 2 Uncontrolled with hyperglycemia and a hemoglobin A1C of 8.3%. -Continue SSI  Primary hypertension -Continue amlodipine  Anemia of chronic disease Currently at baseline hemoglobin of around 10.   Hyperlipidemia -Continue simvastatin  Chronic compression  fractures Located at T12 and L5.  GERD -Continue Protonix  BPH -Continue finasteride -In/Out cath as patient has low urine output   DVT prophylaxis: Eliquis Code Status:   Code Status: Full Code Family Communication: Daughter on telephone Disposition Plan: Discharge pending improvement of diarrheal symptoms and encephalopathy. Anticipate possible need for SNF on discharge.   Consultants:  None  Procedures:  None  Antimicrobials: Vancomycin PO Flagyl    Subjective: Patient is confused. Febrile overnight with a Tmax of 101.4 F.  Objective: BP 139/76 (BP Location: Left Arm)   Pulse 97   Temp 98.1 F (36.7 C) (Axillary)   Resp 20   SpO2 96%   Examination:  General exam: Appears uncomfortable  Respiratory system: Diminished. Tachypnea. Cardiovascular system: S1 & S2 heard, RRR. No murmurs, rubs, gallops or clicks. Gastrointestinal system: Abdomen is nondistended, soft and generally tender. Normal bowel sounds heard. Central nervous system: Alert and not oriented. Musculoskeletal: No edema. No calf tenderness Skin: No cyanosis. No rashes Psychiatry: Judgement and insight appear impaired.   Data Reviewed: I have personally reviewed following labs and imaging studies  CBC Lab Results  Component Value Date   WBC 17.2 (H) 09/27/2022   RBC 3.26 (L) 09/27/2022   HGB 10.5 (L) 09/27/2022   HCT 32.7 (L) 09/27/2022   MCV 100.3 (H) 09/27/2022   MCH 32.2 09/27/2022   PLT 175 09/27/2022   MCHC 32.1 09/27/2022   RDW 13.5 09/27/2022   LYMPHSABS 1.1 09/25/2022   MONOABS 0.5 09/25/2022   EOSABS 0.0 09/25/2022   BASOSABS 0.1 [id number]     Last metabolic panel Lab Results  Component Value Date   NA 138 09/27/2022   K 4.3 09/27/2022   CL 108 09/27/2022   CO2 21 (L) 09/27/2022   BUN 58 (H) 09/27/2022   CREATININE 2.22 (H) 09/27/2022   GLUCOSE 224 (  H) 09/27/2022   GFRNONAA 26 (L) 09/27/2022   GFRAA 46 (L) 11/27/2019   CALCIUM 9.3 09/27/2022   PROT 6.2 (L)  09/25/2022   ALBUMIN 3.5 09/25/2022   BILITOT 2.3 (H) 09/25/2022   ALKPHOS 75 09/25/2022   AST 31 09/25/2022   ALT 29 09/25/2022   ANIONGAP 9 09/27/2022    GFR: CrCl cannot be calculated (Unknown ideal weight.).  Recent Results (from the past 240 hour(s))  C Difficile Quick Screen w PCR reflex     Status: Abnormal   Collection Time: 09/25/22  1:35 AM   Specimen: STOOL  Result Value Ref Range Status   C Diff antigen POSITIVE (A) NEGATIVE Final   C Diff toxin POSITIVE (A) NEGATIVE Final   C Diff interpretation Toxin producing C. difficile detected.  Final    Comment: CRITICAL RESULTS CALLED TO, READ BACK BY AND VERIFIED WITH RN K.OWENS @0306  ON 09/25/2022 BY NM Performed at [facility], [place] [address] [place], [place] [address]   Gastrointestinal Panel by PCR , Stool     Status: None   Collection Time: 09/25/22  1:35 AM   Specimen: STOOL  Result Value Ref Range Status   Campylobacter species NOT DETECTED NOT DETECTED Final   Plesimonas shigelloides NOT DETECTED NOT DETECTED Final   Salmonella species NOT DETECTED NOT DETECTED Final   Yersinia enterocolitica NOT DETECTED NOT DETECTED Final   Vibrio species NOT DETECTED NOT DETECTED Final   Vibrio cholerae NOT DETECTED NOT DETECTED Final   Enteroaggregative E coli (EAEC) NOT DETECTED NOT DETECTED Final   Enteropathogenic E coli (EPEC) NOT DETECTED NOT DETECTED Final   Enterotoxigenic E coli (ETEC) NOT DETECTED NOT DETECTED Final   Shiga like toxin producing E coli (STEC) NOT DETECTED NOT DETECTED Final   Shigella/Enteroinvasive E coli (EIEC) NOT DETECTED NOT DETECTED Final   Cryptosporidium NOT DETECTED NOT DETECTED Final   Cyclospora cayetanensis NOT DETECTED NOT DETECTED Final   Entamoeba histolytica NOT DETECTED NOT DETECTED Final   Giardia lamblia NOT DETECTED NOT DETECTED Final   Adenovirus F40/41 NOT DETECTED NOT DETECTED Final   Astrovirus NOT DETECTED NOT DETECTED Final   Norovirus GI/GII NOT DETECTED NOT  DETECTED Final   Rotavirus A NOT DETECTED NOT DETECTED Final   Sapovirus (I, II, IV, and V) NOT DETECTED NOT DETECTED Final    Comment: Performed at [facility], [address] [place],  [address]      Radiology Studies: CT ABDOMEN PELVIS WO CONTRAST  Result Date: 09/27/2022 CLINICAL DATA:  Sepsis with abdominal pain. EXAM: CT ABDOMEN AND PELVIS WITHOUT CONTRAST TECHNIQUE: Multidetector CT imaging of the abdomen and pelvis was performed following the standard protocol without IV contrast. RADIATION DOSE REDUCTION: This exam was performed according to the departmental dose-optimization program which includes automated exposure control, adjustment of the mA and/or kV according to patient size and/or use of iterative reconstruction technique. COMPARISON:  09/25/2022 FINDINGS: Lower chest: Bibasilar collapse/consolidation with small bilateral pleural effusions, progressive in the interval. Hepatobiliary: No suspicious focal abnormality in the liver on this study without intravenous contrast. Gallbladder surgically absent. No intrahepatic or extrahepatic biliary dilation. Pancreas: No focal mass lesion. No dilatation of the main duct. No intraparenchymal cyst. No peripancreatic edema. Spleen: No splenomegaly. No focal mass lesion. Adrenals/Urinary Tract: No adrenal nodule or mass. Atrophic kidneys bilaterally without hydronephrosis. No evidence for hydroureter. Contrast material in the bladder lumen is compatible with recent contrast infused CT. Bladder wall trabeculation and right-sided bladder wall diverticuli suggest  underlying component of bladder outlet obstruction. Stomach/Bowel: Stomach is decompressed. Duodenum is normally positioned as is the ligament of Treitz. No small bowel wall thickening. No small bowel dilatation. The terminal ileum is normal. The appendix is not well visualized, but there is no edema or inflammation in the region of the cecum. Left-sided colonic diverticular  disease noted subtle pericolonic edema/inflammation associated with the sigmoid segment is similar to prior, likely reflecting diverticulitis. No evidence for abscess. Rectal tube noted. Vascular/Lymphatic: There is moderate atherosclerotic calcification of the abdominal aorta without aneurysm. There is no gastrohepatic or hepatoduodenal ligament lymphadenopathy. No retroperitoneal or mesenteric lymphadenopathy. No pelvic sidewall lymphadenopathy. Reproductive: The prostate gland and seminal vesicles are unremarkable. Other: No intraperitoneal free fluid. Musculoskeletal: Bones are diffusely demineralized. Status post vertebral augmentation at L1 and L4. Compression deformity noted at T12 and L5, similar to prior. IMPRESSION: 1. Left-sided colonic diverticular disease with subtle pericolonic edema/inflammation associated with the sigmoid segment, similar to prior, likely reflecting diverticulitis. No evidence for abscess. 2. Bibasilar collapse/consolidation with small bilateral pleural effusions, progressive in the interval. 3. Bladder wall trabeculation and right-sided bladder wall diverticuli suggest underlying component of bladder outlet obstruction. 4.  Aortic Atherosclerosis (ICD10-I70.0). Electronically Signed   By: Misty Stanley M.D.   On: 09/27/2022 13:24      LOS: 2 days    Cordelia Poche, MD [facility] 09/27/2022, 2:22 PM   If 7PM-7AM, please contact night-coverage [email or web address]

## 2022-09-27 NOTE — Hospital Course (Signed)
Daniel Cooke is a 88 y.o. male with a history of hypertension, atrial fibrillation on Eliquis, CKD stage IIIb, diabetes mellitus and BPH. Patient presented secondary to altered mental status and found to have evidence of C. Difficile colitis and persistent diverticulitis meeting sepsis criteria on admission. Antibiotics initiated.

## 2022-09-28 ENCOUNTER — Inpatient Hospital Stay (HOSPITAL_COMMUNITY): Payer: Medicare Other

## 2022-09-28 ENCOUNTER — Inpatient Hospital Stay (HOSPITAL_COMMUNITY): Payer: Medicare Other | Admitting: Certified Registered"

## 2022-09-28 DIAGNOSIS — J9600 Acute respiratory failure, unspecified whether with hypoxia or hypercapnia: Secondary | ICD-10-CM

## 2022-09-28 DIAGNOSIS — G9341 Metabolic encephalopathy: Secondary | ICD-10-CM | POA: Diagnosis not present

## 2022-09-28 DIAGNOSIS — A0472 Enterocolitis due to Clostridium difficile, not specified as recurrent: Secondary | ICD-10-CM | POA: Diagnosis not present

## 2022-09-28 DIAGNOSIS — I469 Cardiac arrest, cause unspecified: Secondary | ICD-10-CM

## 2022-09-28 DIAGNOSIS — D638 Anemia in other chronic diseases classified elsewhere: Secondary | ICD-10-CM | POA: Diagnosis not present

## 2022-09-28 DIAGNOSIS — S22000A Wedge compression fracture of unspecified thoracic vertebra, initial encounter for closed fracture: Secondary | ICD-10-CM | POA: Diagnosis not present

## 2022-09-28 DIAGNOSIS — U071 COVID-19: Secondary | ICD-10-CM

## 2022-09-28 LAB — COMPREHENSIVE METABOLIC PANEL
ALT: 41 U/L (ref 0–44)
ALT: 55 U/L — ABNORMAL HIGH (ref 0–44)
AST: 72 U/L — ABNORMAL HIGH (ref 15–41)
AST: 96 U/L — ABNORMAL HIGH (ref 15–41)
Albumin: 3.1 g/dL — ABNORMAL LOW (ref 3.5–5.0)
Albumin: 2.6 g/dL — ABNORMAL LOW (ref 3.5–5.0)
Alkaline Phosphatase: 111 U/L (ref 38–126)
Alkaline Phosphatase: 91 U/L (ref 38–126)
Anion gap: 11 (ref 5–15)
Anion gap: 9 (ref 5–15)
BUN: 63 mg/dL — ABNORMAL HIGH (ref 8–23)
BUN: 65 mg/dL — ABNORMAL HIGH (ref 8–23)
CO2: 18 mmol/L — ABNORMAL LOW (ref 22–32)
CO2: 23 mmol/L (ref 22–32)
Calcium: 9.2 mg/dL (ref 8.9–10.3)
Calcium: 9.1 mg/dL (ref 8.9–10.3)
Chloride: 112 mmol/L — ABNORMAL HIGH (ref 98–111)
Chloride: 113 mmol/L — ABNORMAL HIGH (ref 98–111)
Creatinine, Ser: 2.21 mg/dL — ABNORMAL HIGH (ref 0.61–1.24)
Creatinine, Ser: 2.32 mg/dL — ABNORMAL HIGH (ref 0.61–1.24)
GFR, Estimated: 27 mL/min — ABNORMAL LOW (ref >60)
GFR, Estimated: 25 mL/min — ABNORMAL LOW (ref >60)
Glucose, Bld: 141 mg/dL — ABNORMAL HIGH (ref 70–99)
Glucose, Bld: 212 mg/dL — ABNORMAL HIGH (ref 70–99)
Potassium: 4.4 mmol/L (ref 3.5–5.1)
Potassium: 4.4 mmol/L (ref 3.5–5.1)
Sodium: 141 mmol/L (ref 135–145)
Sodium: 145 mmol/L (ref 135–145)
Total Bilirubin: 1.6 mg/dL — ABNORMAL HIGH (ref 0.3–1.2)
Total Bilirubin: 1.2 mg/dL (ref 0.3–1.2)
Total Protein: 5.5 g/dL — ABNORMAL LOW (ref 6.5–8.1)
Total Protein: 5 g/dL — ABNORMAL LOW (ref 6.5–8.1)

## 2022-09-28 LAB — POCT I-STAT 7, (LYTES, BLD GAS, ICA,H+H)
Acid-base deficit: 7 mmol/L — ABNORMAL HIGH (ref 0.0–2.0)
Bicarbonate: 18.7 mmol/L — ABNORMAL LOW (ref 20.0–28.0)
Calcium, Ion: 1.27 mmol/L (ref 1.15–1.40)
HCT: 27 % — ABNORMAL LOW (ref 39.0–52.0)
Hemoglobin: 9.2 g/dL — ABNORMAL LOW (ref 13.0–17.0)
O2 Saturation: 82 %
Patient temperature: 97.8
Potassium: 4 mmol/L (ref 3.5–5.1)
Sodium: 144 mmol/L (ref 135–145)
TCO2: 20 mmol/L — ABNORMAL LOW (ref 22–32)
pCO2 arterial: 36.9 mmHg (ref 32–48)
pH, Arterial: 7.311 — ABNORMAL LOW (ref 7.35–7.45)
pO2, Arterial: 49 mmHg — ABNORMAL LOW (ref 83–108)

## 2022-09-28 LAB — CBC
HCT: 33.3 % — ABNORMAL LOW (ref 39.0–52.0)
HCT: 34.6 % — ABNORMAL LOW (ref 39.0–52.0)
Hemoglobin: 10.8 g/dL — ABNORMAL LOW (ref 13.0–17.0)
Hemoglobin: 10.9 g/dL — ABNORMAL LOW (ref 13.0–17.0)
MCH: 33 pg (ref 26.0–34.0)
MCH: 33.4 pg (ref 26.0–34.0)
MCHC: 32.4 g/dL (ref 30.0–36.0)
MCHC: 31.5 g/dL (ref 30.0–36.0)
MCV: 101.8 fL — ABNORMAL HIGH (ref 80.0–100.0)
MCV: 106.1 fL — ABNORMAL HIGH (ref 80.0–100.0)
Platelets: 197 10*3/uL (ref 150–400)
Platelets: 187 10*3/uL (ref 150–400)
RBC: 3.27 MIL/uL — ABNORMAL LOW (ref 4.22–5.81)
RBC: 3.26 MIL/uL — ABNORMAL LOW (ref 4.22–5.81)
RDW: 13.6 % (ref 11.5–15.5)
RDW: 13.5 % (ref 11.5–15.5)
WBC: 17.6 10*3/uL — ABNORMAL HIGH (ref 4.0–10.5)
WBC: 17.8 10*3/uL — ABNORMAL HIGH (ref 4.0–10.5)
nRBC: 0 % (ref 0.0–0.2)
nRBC: 0.6 % — ABNORMAL HIGH (ref 0.0–0.2)

## 2022-09-28 LAB — GLUCOSE, CAPILLARY
Glucose-Capillary: 138 mg/dL — ABNORMAL HIGH (ref 70–99)
Glucose-Capillary: 155 mg/dL — ABNORMAL HIGH (ref 70–99)
Glucose-Capillary: 163 mg/dL — ABNORMAL HIGH (ref 70–99)
Glucose-Capillary: 168 mg/dL — ABNORMAL HIGH (ref 70–99)
Glucose-Capillary: 184 mg/dL — ABNORMAL HIGH (ref 70–99)
Glucose-Capillary: 204 mg/dL — ABNORMAL HIGH (ref 70–99)

## 2022-09-28 LAB — ECHOCARDIOGRAM LIMITED
AR max vel: 2.18 cm2
AV Area VTI: 2.36 cm2
AV Area mean vel: 2.14 cm2
AV Mean grad: 5 mmHg
AV Peak grad: 9 mmHg
Ao pk vel: 1.5 m/s
Height: 70 in
S' Lateral: 2.6 cm

## 2022-09-28 LAB — TROPONIN I (HIGH SENSITIVITY)
Troponin I (High Sensitivity): 216 ng/L (ref <18)
Troponin I (High Sensitivity): 232 ng/L (ref <18)
Troponin I (High Sensitivity): 308 ng/L (ref <18)

## 2022-09-28 LAB — MRSA NEXT GEN BY PCR, NASAL: MRSA by PCR Next Gen: NOT DETECTED

## 2022-09-28 LAB — LACTIC ACID, PLASMA
Lactic Acid, Venous: 3.6 mmol/L (ref 0.5–1.9)
Lactic Acid, Venous: 2.2 mmol/L (ref 0.5–1.9)

## 2022-09-28 LAB — MAGNESIUM: Magnesium: 2.6 mg/dL — ABNORMAL HIGH (ref 1.7–2.4)

## 2022-09-28 LAB — PROCALCITONIN: Procalcitonin: 0.5 ng/mL

## 2022-09-28 MED ORDER — SODIUM BICARBONATE 8.4 % IV SOLN
INTRAVENOUS | Status: AC
  Filled 2022-09-28: qty 50

## 2022-09-28 MED ORDER — SODIUM CHLORIDE 0.9% FLUSH
10.0000 mL | Freq: Two times a day (BID) | INTRAVENOUS | Status: DC
  Administered 2022-09-28 – 2022-09-29 (×2): 30 mL
  Administered 2022-09-29 – 2022-09-30 (×2): 10 mL
  Administered 2022-10-01: 30 mL
  Administered 2022-10-01 – 2022-10-03 (×5): 10 mL
  Administered 2022-10-04: 40 mL
  Administered 2022-10-04: 10 mL
  Administered 2022-10-05: 20 mL
  Administered 2022-10-05 – 2022-10-07 (×3): 10 mL

## 2022-09-28 MED ORDER — ORAL CARE MOUTH RINSE: 15.0000 mL | OROMUCOSAL | Status: DC | PRN

## 2022-09-28 MED ORDER — DOCUSATE SODIUM 50 MG/5ML PO LIQD
100.0000 mg | Freq: Two times a day (BID) | ORAL | Status: DC
  Filled 2022-09-28: qty 10

## 2022-09-28 MED ORDER — CHLORHEXIDINE GLUCONATE CLOTH 2 % EX PADS
6.0000 | MEDICATED_PAD | Freq: Every day | CUTANEOUS | Status: DC
  Administered 2022-09-28 – 2022-10-13 (×16): 6 via TOPICAL

## 2022-09-28 MED ORDER — POLYETHYLENE GLYCOL 3350 17 G PO PACK: 17.0000 g | PACK | Freq: Every day | ORAL | Status: DC

## 2022-09-28 MED ORDER — PROPOFOL 1000 MG/100ML IV EMUL
0.0000 ug/kg/min | INTRAVENOUS | Status: DC
  Administered 2022-09-28: 20 ug/kg/min via INTRAVENOUS
  Administered 2022-09-28: 5 ug/kg/min via INTRAVENOUS
  Administered 2022-09-29: 20 ug/kg/min via INTRAVENOUS
  Administered 2022-09-29: 5 ug/kg/min via INTRAVENOUS
  Filled 2022-09-28 (×4): qty 100

## 2022-09-28 MED ORDER — SODIUM CHLORIDE 0.9 % IV SOLN
3.0000 g | Freq: Two times a day (BID) | INTRAVENOUS | Status: DC
  Administered 2022-09-28 – 2022-09-30 (×4): 3 g via INTRAVENOUS
  Filled 2022-09-28 (×4): qty 8

## 2022-09-28 MED ORDER — SODIUM BICARBONATE 8.4 % IV SOLN: 50.0000 meq | Freq: Once | INTRAVENOUS | Status: DC

## 2022-09-28 MED ORDER — PANTOPRAZOLE SODIUM 40 MG IV SOLR
40.0000 mg | Freq: Every day | INTRAVENOUS | Status: DC
  Administered 2022-09-28: 40 mg via INTRAVENOUS
  Filled 2022-09-28: qty 10

## 2022-09-28 MED ORDER — SODIUM BICARBONATE 8.4 % IV SOLN
INTRAVENOUS | Status: AC
  Administered 2022-09-28: 50 meq via INTRAVENOUS
  Filled 2022-09-28: qty 50

## 2022-09-28 MED ORDER — VANCOMYCIN 50 MG/ML ORAL SOLUTION: 125.0000 mg | Freq: Four times a day (QID) | ORAL | Status: DC

## 2022-09-28 MED ORDER — HEPARIN SODIUM (PORCINE) 5000 UNIT/ML IJ SOLN
5000.0000 [IU] | Freq: Three times a day (TID) | INTRAMUSCULAR | Status: DC
  Administered 2022-09-28 – 2022-09-29 (×3): 5000 [IU] via SUBCUTANEOUS
  Filled 2022-09-28 (×3): qty 1

## 2022-09-28 MED ORDER — INSULIN ASPART 100 UNIT/ML IJ SOLN
0.0000 [IU] | INTRAMUSCULAR | Status: DC
  Administered 2022-09-28 (×2): 2 [IU] via SUBCUTANEOUS
  Administered 2022-09-28: 3 [IU] via SUBCUTANEOUS
  Administered 2022-09-29 (×2): 2 [IU] via SUBCUTANEOUS
  Administered 2022-09-29 (×2): 1 [IU] via SUBCUTANEOUS
  Administered 2022-09-29: 2 [IU] via SUBCUTANEOUS
  Administered 2022-09-30: 1 [IU] via SUBCUTANEOUS
  Administered 2022-09-30: 2 [IU] via SUBCUTANEOUS
  Administered 2022-09-30: 3 [IU] via SUBCUTANEOUS
  Administered 2022-09-30 (×3): 2 [IU] via SUBCUTANEOUS
  Administered 2022-10-01: 5 [IU] via SUBCUTANEOUS
  Administered 2022-10-01 (×2): 3 [IU] via SUBCUTANEOUS
  Administered 2022-10-01: 2 [IU] via SUBCUTANEOUS
  Administered 2022-10-01: 5 [IU] via SUBCUTANEOUS
  Administered 2022-10-01: 3 [IU] via SUBCUTANEOUS
  Administered 2022-10-02: 2 [IU] via SUBCUTANEOUS
  Administered 2022-10-02 (×2): 3 [IU] via SUBCUTANEOUS
  Administered 2022-10-02: 5 [IU] via SUBCUTANEOUS
  Administered 2022-10-02: 3 [IU] via SUBCUTANEOUS
  Administered 2022-10-02: 5 [IU] via SUBCUTANEOUS
  Administered 2022-10-03: 2 [IU] via SUBCUTANEOUS
  Administered 2022-10-03 (×3): 5 [IU] via SUBCUTANEOUS
  Administered 2022-10-03: 1 [IU] via SUBCUTANEOUS
  Administered 2022-10-03: 2 [IU] via SUBCUTANEOUS
  Administered 2022-10-04 (×4): 1 [IU] via SUBCUTANEOUS
  Administered 2022-10-05 – 2022-10-06 (×4): 3 [IU] via SUBCUTANEOUS
  Administered 2022-10-06: 2 [IU] via SUBCUTANEOUS

## 2022-09-28 MED ORDER — VANCOMYCIN 50 MG/ML ORAL SOLUTION
125.0000 mg | Freq: Four times a day (QID) | ORAL | Status: DC
  Administered 2022-09-28 – 2022-09-30 (×6): 125 mg
  Filled 2022-09-28 (×8): qty 2.5

## 2022-09-28 MED ORDER — SODIUM BICARBONATE 8.4 % IV SOLN
50.0000 meq | Freq: Once | INTRAVENOUS | Status: AC
  Administered 2022-09-28: 50 meq via INTRAVENOUS

## 2022-09-28 MED ORDER — FENTANYL CITRATE PF 50 MCG/ML IJ SOSY
25.0000 ug | PREFILLED_SYRINGE | INTRAMUSCULAR | Status: DC | PRN
  Administered 2022-09-28 – 2022-09-29 (×2): 50 ug via INTRAVENOUS
  Filled 2022-09-28 (×2): qty 1

## 2022-09-28 MED ORDER — CHLORHEXIDINE GLUCONATE CLOTH 2 % EX PADS
6.0000 | MEDICATED_PAD | Freq: Every day | CUTANEOUS | Status: DC
  Administered 2022-09-28: 6 via TOPICAL

## 2022-09-28 MED ORDER — NOREPINEPHRINE 4 MG/250ML-% IV SOLN
0.0000 ug/min | INTRAVENOUS | Status: DC
  Administered 2022-09-28: 10 ug/min via INTRAVENOUS
  Administered 2022-09-28: 2 ug/min via INTRAVENOUS
  Administered 2022-09-28: 10 ug/min via INTRAVENOUS
  Administered 2022-09-29: 3 ug/min via INTRAVENOUS
  Filled 2022-09-28 (×4): qty 250

## 2022-09-28 MED ORDER — FENTANYL CITRATE PF 50 MCG/ML IJ SOSY: 25.0000 ug | PREFILLED_SYRINGE | INTRAMUSCULAR | Status: DC | PRN

## 2022-09-28 MED ORDER — SODIUM CHLORIDE 0.9% FLUSH: 10.0000 mL | INTRAVENOUS | Status: DC | PRN

## 2022-09-28 MED ORDER — ORAL CARE MOUTH RINSE
15.0000 mL | OROMUCOSAL | Status: DC
  Administered 2022-09-28 – 2022-10-13 (×159): 15 mL via OROMUCOSAL

## 2022-09-28 NOTE — Procedures (Signed)
Arterial Catheter Insertion Procedure Note  Daniel Cooke  [id number]  Apr 09, 1924  Date:09/28/22  Time:4:04 PM    Provider Performing: Candee Furbish    Procedure: Insertion of Arterial Line [phone]) with US guidance (29518)   Indication(s) Blood pressure monitoring and/or need for frequent ABGs  Consent Obtained verbally from family  Anesthesia None   Time Out Verified patient identification, verified procedure, site/side was marked, verified correct patient position, special equipment/implants available, medications/allergies/relevant history reviewed, required imaging and test results available.   Sterile Technique Maximal sterile technique including full sterile barrier drape, hand hygiene, sterile gown, sterile gloves, mask, hair covering, sterile ultrasound probe cover (if used).   Procedure Description Area of catheter insertion was cleaned with chlorhexidine and draped in sterile fashion. With real-time ultrasound guidance an arterial catheter was placed into the left radial artery.  Appropriate arterial tracings confirmed on monitor.     Complications/Tolerance None; patient tolerated the procedure well.   EBL Minimal   Specimen(s) None

## 2022-09-28 NOTE — Anesthesia Procedure Notes (Signed)
Procedure Name: Intubation Date/Time: 09/28/2022 12:55 PM  Performed by: Anastasio Auerbach, CRNAOxygen Delivery Method: Ambu bag Ventilation: Mask ventilation without difficulty Laryngoscope Size: 3 and Glidescope Tube type: Oral Number of attempts: 1 Airway Equipment and Method: Stylet and Oral airway Placement Confirmation: ETT inserted through vocal cords under direct vision, positive ETCO2 and breath sounds checked- equal and bilateral Secured at: 22 cm Tube secured with: Tape Dental Injury: Teeth and Oropharynx as per pre-operative assessment

## 2022-09-28 NOTE — Progress Notes (Signed)
   09/28/22 1250  Clinical Encounter Type  Visited With Patient not available  Visit Type Initial;Code  Referral From Nurse  Consult/Referral To Chaplain   Chaplain responded to a code blue at 4E13. Patient is under the care of the medical team. No family is present. Patient is moved to 3M12.  If a chaplain is requested someone will respond.

## 2022-09-28 NOTE — Progress Notes (Signed)
OT Cancellation Note  Patient Details Name: Daniel Cooke MRN: [id number] DOB: 10-22-1924   Cancelled Treatment:    Reason Eval/Treat Not Completed: Other (comment) (Pt with medical team in room, will hold OT eval until appropriate.)  Lynnda Child, OTD, OTR/L Acute Rehab [phone]) [phone]   Kaylyn Lim 09/28/2022, 12:58 PM

## 2022-09-28 NOTE — Procedures (Signed)
Central Venous Catheter Insertion Procedure Note  Daniel Cooke  [id number]  10-14-1924  Date:09/28/22  Time:4:01 PM   Provider Performing:Idy Rawling Cipriano Mile   Procedure: Insertion of Non-tunneled Central Venous [phone]) with US guidance ([id number])   Indication(s) Medication administration and Difficult access  Consent Obtained verbally from family  Anesthesia Topical only with 1% lidocaine   Timeout Verified patient identification, verified procedure, site/side was marked, verified correct patient position, special equipment/implants available, medications/allergies/relevant history reviewed, required imaging and test results available.  Sterile Technique Maximal sterile technique including full sterile barrier drape, hand hygiene, sterile gown, sterile gloves, mask, hair covering, sterile ultrasound probe cover (if used).  Procedure Description Area of catheter insertion was cleaned with chlorhexidine and draped in sterile fashion.  With real-time ultrasound guidance a central venous catheter was placed into the left internal jugular vein. Nonpulsatile blood flow and easy flushing noted in all ports.  The catheter was sutured in place and sterile dressing applied.  Complications/Tolerance None; patient tolerated the procedure well. Chest X-ray is ordered to verify placement for internal jugular or subclavian cannulation.   Chest x-ray is not ordered for femoral cannulation.  EBL Minimal  Specimen(s) None

## 2022-09-28 NOTE — Code Documentation (Signed)
  Patient Name: Daniel Cooke   MRN: [id number]   Date of Birth/ Sex: 1924/05/13 , male      Admission Date: 09/25/2022  Attending Provider: Mariel Aloe, MD  Primary Diagnosis: C. difficile colitis   Indication: Pt was in his usual state of health until this PM, when he was noted to be in asystole. Code blue was subsequently called. At the time of arrival on scene, ACLS protocol was underway.   Technical Description:  - CPR performance duration:  12 minutes  - Was defibrillation or cardioversion used? No   - Was external pacer placed? Yes  - Was patient intubated pre/post CPR? Yes   Medications Administered: Y = Yes; Blank = No Amiodarone    Atropine    Calcium    Epinephrine  Y  Lidocaine    Magnesium  Y  Norepinephrine    Phenylephrine    Sodium bicarbonate  Y  Vasopressin     Post CPR evaluation:  - Final Status - Was patient successfully resuscitated ? Yes - What is current rhythm? Sinus rhythm - What is current hemodynamic status? Unstable, transferred to ICU, no pressors at time of handoff  Miscellaneous Information:  - Labs sent, including: Troponin, glucose, magnesium, CMP, CBC  - Primary team notified?  Yes  - Family Notified? Yes  - Additional notes/ transfer status: Transferred to ICU after ROSC, Dr. Tamala Julian at bedside and accepted handoff.     Nani Gasser, MD  09/28/2022, 1:13 PM

## 2022-09-28 NOTE — Progress Notes (Signed)
Pt is on the way to CT. Rn will call when pt returns.

## 2022-09-28 NOTE — Progress Notes (Signed)
Pt transported from 3M12 to CT and back with no complications noted.

## 2022-09-28 NOTE — Consult Note (Signed)
NAME:  Daniel Cooke, MRN:  [id number], DOB:  26-May-1924, LOS: 3 ADMISSION DATE:  09/25/2022, CONSULTATION DATE:  09/28/22 REFERRING MD:  Code team, CHIEF COMPLAINT:  arrest   History of Present Illness:  88 year old man with hx of CKD, HLD, DM2 but otherwise fully functional who presented with recurrent colitis found to have C difficile infection.  Hospital course complicated by delirium.  Unfortunately today had what seems to be a aspiration event then had cardiac arrest.  ROSC within 10 mins.  Overbreathing vent.  PCCM consulted to assist with post-CPR management.  Intubated and not following commands.  Pertinent  Medical History   Past Medical History:  Diagnosis Date   Arthritis    "was in left knee before replacement; now have it in my right knee" (11/25/2016)   BPH (benign prostatic hypertrophy)    Chronic diastolic CHF (congestive heart failure) (HCC)    a. 02/2016 Echo: EF 60-65%, no rwma, triv AI, mild MR, mildly to mod dil LA, mod dil RA, PASP .   Dysrhythmia    Essential hypertension    Hard of hearing    wears bilateral hearing aids   Hypercholesterolemia    Osteoarthritis    PAF (paroxysmal atrial fibrillation) (HCC)    a. CHADS2VASC score of 4 --> coumadin.   Pilonidal cyst    PAST HX - NO PROBLEM NOW   Pneumonia    "one time; years ago" (11/25/2016)   Shingles    "long time ago"   Synovitis of knee 02/27/2014   Type II diabetes mellitus (HCC)    oral meds - no insulin    Significant Hospital Events: Including procedures, antibiotic start and stop dates in addition to other pertinent events   10/13 admit 10/16 cardiac arrest  Interim History / Subjective:  Consulted.  Objective   Blood pressure (!) 102/59, pulse 75, temperature 97.8 F (36.6 C), temperature source Oral, resp. rate (!) 27, height 5\' 10"  (1.778 m), SpO2 92 %.    Vent Mode: PRVC FiO2 (%):  [100 %] 100 % Set Rate:  [20 bmp] 20 bmp Vt Set:  [580 mL] 580 mL PEEP:  [5 cmH20] 5  cmH20 Plateau Pressure:  [10 cmH20-22 cmH20] 22 cmH20   Intake/Output Summary (Last 24 hours) at 09/28/2022 1517 Last data filed at 09/27/2022 1917 Gross per 24 hour  Intake 100 ml  Output 370 ml  Net -270 ml   There were no vitals filed for this visit.  Examination: General: unresponsive man on vent HENT: pupils small but reactive Lungs: Lungs with terrible rhonci bilaterally, thick seceretions Cardiovascular: RRR, ext warm Abdomen: Soft, hypoactive BS Extremities: No edema Neuro: decorticate response to pain, cough/gag/blink intact Skin: slightly pale  Mild trop bump CBC stable leukocytosis Worsening AKI- has received good deal of fluids  CXR ARDS Mild trop bump Formal TTE being done: EF preserved, no McConnell's, no pericardial effusion, +aortic stenosis  Resolved Hospital Problem list   N/A  Assessment & Plan:  PEA IHCA with timing and imaging most c/w aspiration event in setting of critical illness delirium.  Echo preserved.  Tele leads do not show any concerning ST changes.  Formal EKG pending.  Chemistries benign.  Time of code ~10 mins.  Not following commands on ROSC.   C diff colitis Metabolic encephalopathy/ critical illness delirium AKI on CKD- in setting of infection, post arrest, received 2L fluids post arrest Post arrest acute liver injury- mild DM2 with hyperglycemia- exacerbated by acute illness  -  TTM if does not wake up more - Lung protective tidal volumes limiting driving pressures to < 64QI H2O as able - Sedation titrated to vent compliance and patient comfort using PAD orderset (RASS -3 to -4 if on TTM) - VAP prevention bundle - Daily SAT/SBT when meets institutional criteria - Check EEG, head CT - f/u echo read - PO vanc and flagyl - Unasyn for aspiration, check tracheal aspirate - Levophed for MAP 65  Best Practice (right click and "Reselect all SmartList Selections" daily)   Diet/type: NPO DVT prophylaxis: prophylactic heparin  GI  prophylaxis: PPI Lines: Central line Foley:  Yes, and it is still needed Code Status:  full code Last date of multidisciplinary goals of care discussion [will ask PMT to see]  Labs   CBC: Recent Labs  Lab 09/25/22 0055 09/26/22 0101 09/27/22 0145 09/28/22 0119 09/28/22 1304  WBC 15.2* 17.3* 17.2* 17.6* 17.8*  NEUTROABS 13.4*  --   --   --   --   HGB 10.5* 10.8* 10.5* 10.8* 10.9*  HCT 32.6* 33.1* 32.7* 33.3* 34.6*  MCV 100.3* 99.4 100.3* 101.8* 106.1*  PLT 172 163 175 197 187    Basic Metabolic Panel: Recent Labs  Lab 09/25/22 0055 09/26/22 0101 09/27/22 0145 09/28/22 0119 09/28/22 1304  NA 139 141 138 141 145  K 4.8 4.2 4.3 4.4 4.4  CL 105 107 108 112* 113*  CO2 24 22 21* 18* 23  GLUCOSE 273* 180* 224* 141* 212*  BUN 40* 36* 58* 63* 65*  CREATININE 1.92* 1.92* 2.22* 2.21* 2.32*  CALCIUM 9.9 9.8 9.3 9.2 9.1  MG 2.3  --   --   --  2.6*   GFR: CrCl cannot be calculated (Unknown ideal weight.). Recent Labs  Lab 09/26/22 0101 09/26/22 0619 09/27/22 0145 09/27/22 0818 09/27/22 1439 09/28/22 0119 09/28/22 0120 09/28/22 1304  PROCALCITON  --   --   --  0.62  --   --  0.50  --   WBC 17.3*  --  17.2*  --   --  17.6*  --  17.8*  LATICACIDVEN 3.3* 2.5*  --  2.6* 2.0*  --   --   --     Liver Function Tests: Recent Labs  Lab 09/25/22 0055 09/28/22 0119 09/28/22 1304  AST 31 72* 96*  ALT 29 41 55*  ALKPHOS 75 111 91  BILITOT 2.3* 1.6* 1.2  PROT 6.2* 5.5* 5.0*  ALBUMIN 3.5 3.1* 2.6*   No results for input(s): "LIPASE", "AMYLASE" in the last 168 hours. No results for input(s): "AMMONIA" in the last 168 hours.  ABG    Component Value Date/Time   TCO2 27 02/04/2017 1309     Coagulation Profile: No results for input(s): "INR", "PROTIME" in the last 168 hours.  Cardiac Enzymes: No results for input(s): "CKTOTAL", "CKMB", "CKMBINDEX", "TROPONINI" in the last 168 hours.  HbA1C: Hgb A1c MFr Bld  Date/Time Value Ref Range Status  09/12/2022 01:45 PM 8.3  (H) 4.8 - 5.6 % Final    Comment:    (NOTE) Pre diabetes:          5.7%-6.4%  Diabetes:              >6.4%  Glycemic control for   <7.0% adults with diabetes   05/21/2021 10:00 AM 8.5 (H) 4.8 - 5.6 % Final    Comment:    (NOTE)         Prediabetes: 5.7 - 6.4  Diabetes: >6.4         Glycemic control for adults with diabetes: <7.0     CBG: Recent Labs  Lab 09/27/22 2133 09/28/22 0605 09/28/22 1120 09/28/22 1247 09/28/22 1501  GLUCAP 134* 138* 155* 184* 168*    Review of Systems:   Coma  Past Medical History:  He,  has a past medical history of Arthritis, BPH (benign prostatic hypertrophy), Chronic diastolic CHF (congestive heart failure) (HCC), Dysrhythmia, Essential hypertension, Hard of hearing, Hypercholesterolemia, Osteoarthritis, PAF (paroxysmal atrial fibrillation) (HCC), Pilonidal cyst, Pneumonia, Shingles, Synovitis of knee (02/27/2014), and Type II diabetes mellitus (HCC).   Surgical History:   Past Surgical History:  Procedure Laterality Date   APPENDECTOMY     CARDIOVERSION N/A 02/04/2017   Procedure: CARDIOVERSION;  Surgeon: [name], MD;  Location: [facility] ENDOSCOPY;  Service: Cardiovascular;  Laterality: N/A;   CATARACT EXTRACTION W/ INTRAOCULAR LENS IMPLANT Left 10/2016   CHOLECYSTECTOMY OPEN     COLONOSCOPY W/ BIOPSIES  08/2005   [name] 04/27/2011   EYE SURGERY Right    "right" traumatic cataract removed--injury to the eye--states his eyesight is ok in left eye   JOINT REPLACEMENT     KNEE ARTHROSCOPY Left 02/28/2014   Procedure: ARTHROSCOPY LEFT KNEE WITH SYNOVECTOMY;  Surgeon: [name], MD;  Location: [facility] ORS;  Service: Orthopedics;  Laterality: Left;   KYPHOPLASTY  2017   KYPHOPLASTY N/A 05/28/2017   Procedure: Lumbar Four Kyphoplasty;  Surgeon: Maeola Harman, MD;  Location: [facility] OR;  Service: Neurosurgery;  Laterality: N/A;  L4 Kyphoplasty   SHOULDER OPEN ROTATOR CUFF REPAIR Left    TONSILLECTOMY     TOTAL KNEE ARTHROPLASTY   02/08/2012   Procedure: TOTAL KNEE ARTHROPLASTY;  Surgeon: [name], MD;  Location: [facility] ORS;  Service: Orthopedics;  Laterality: Left;   VASECTOMY       Social History:   reports that he has never smoked. He has never used smokeless tobacco. He reports that he does not drink alcohol and does not use drugs.   Family History:  His family history includes Heart attack in his brother; Heart disease in his brother and mother.   Allergies Allergies  Allergen Reactions   Codeine Nausea And Vomiting and Other (See Comments)    MAKES ME CRAZY   Oxycodone Nausea And Vomiting and Other (See Comments)    MAKES ME CRAZY   Hydrocodone Nausea Only and Other (See Comments)    Daughter states "messes him up mentally"   Nsaids     Other reaction(s): Contraindicated d/t CKD   Other Other (See Comments)    All narcotic pain medicine makes him crazy.    Oxybutynin     Other reaction(s): dry mouth   Tramadol Hcl     Other reaction(s): altered mental status     Home Medications  Prior to Admission medications   Medication Sig Start Date End Date Taking? Authorizing Provider  acetaminophen (TYLENOL) 500 MG tablet Take 500 mg by mouth 2 (two) times daily.   Yes [provider]  amLODipine (NORVASC) 2.5 MG tablet Take 2.5 mg by mouth daily. 05/06/21  Yes [provider]  apixaban (ELIQUIS) 2.5 MG TABS tablet Take 1 tablet by mouth twice daily 07/01/22  Yes Camnitz, Will Daphine Deutscher, MD  CALCIUM PO Take 600 mg by mouth daily.   Yes [provider]  carboxymethylcellulose (REFRESH PLUS) 0.5 % SOLN Place 1 drop into both eyes daily as needed (dry eyes).   Yes [provider]  Cholecalciferol (VITAMIN D3 PO) Take 1,000 Units by mouth daily.   Yes [provider]  finasteride (PROSCAR) 5 MG tablet Take 5 mg by mouth daily. 05/01/19  Yes [provider]  furosemide (LASIX) 40 MG tablet Take 1/2 (one-half) tablet by mouth once daily Patient taking  differently: Take 20 mg by mouth daily. 09/16/22  Yes Camnitz, Will Hassell Done, MD  HUMALOG KWIKPEN 100 UNIT/ML KwikPen Inject 2 Units into the skin daily with supper.  12/02/18  Yes [provider]  Insulin Glargine (LANTUS SOLOSTAR) 100 UNIT/ML Solostar Pen Inject 4 Units into the skin at bedtime. Patient taking differently: Inject 12-16 Units into the skin at bedtime. 11/27/19  Yes Kc, Maren Beach, MD  MYRBETRIQ 50 MG TB24 tablet Take 50 mg by mouth at bedtime. 03/14/21  Yes [provider]  omeprazole (PRILOSEC) 20 MG capsule Take 20 mg by mouth every morning. 08/13/22  Yes [provider]  simvastatin (ZOCOR) 20 MG tablet Take 20 mg by mouth at bedtime.   Yes [provider]  traZODone (DESYREL) 50 MG tablet Take 50 mg by mouth at bedtime. 12/04/19  Yes [provider]  Continuous Blood Gluc Receiver (FREESTYLE LIBRE 14 DAY READER) [place] See admin instructions. 12/01/19   [provider]  Insulin Pen Needle (BD PEN NEEDLE NANO U/F) 32G X 4 MM MISC USE AS DIRECTED ONCE DAILY FOR 30 DAYS 10/28/18   [provider]     Critical care time: 34 minutes independent of procedures

## 2022-09-28 NOTE — Progress Notes (Signed)
Echocardiogram 2D Echocardiogram has been performed.  Daniel Cooke 09/28/2022, 3:54 PM

## 2022-09-28 NOTE — Progress Notes (Signed)
EEG complete - results pending 

## 2022-09-28 NOTE — Progress Notes (Signed)
Pt transported from floor to 49M ICU via manually bagged. No complications noted.

## 2022-09-28 NOTE — Progress Notes (Signed)
PROGRESS NOTE    RAIN CURL  [id number] DOB: 01/02/1924 DOA: 09/25/2022 PCP: Glenis Smoker, MD   Brief Narrative: Daniel Cooke [place] is a 88 y.o. male with a history of hypertension, atrial fibrillation on Eliquis, CKD stage IIIb, diabetes mellitus and BPH. Patient presented secondary to altered mental status and found to have evidence of C. Difficile colitis and persistent diverticulitis meeting sepsis criteria on admission. Antibiotics initiated.   Assessment and Plan:  C. Difficile Colitis Possible secondary to recent Augmentin. Associated diarrhea and abdominal pain. Positive C. Difficile antigen and toxin. Started on Vancomycin PO for treatment. Repeat CT abdomen/pelvis without development of abscess/perforation. Intermittent fevers. Blood cultures with no growth to date. -Continue Vancomycin PO  Asystole Patient found to have asystole on telemetry with confirmed cardiac arrest at bedside. Code blue called and patient achieved ROSC. Patient intubated and sent to ICU for continued management. Possible aspiration event leading to event.  Diverticulitis Previously treated but persistent diverticulitis noted on imaging. Possibly related to C. Difficile infection. Started on Flagyl on admission. -Continue Flagyl  Severe sepsis Present on admission. Secondary to C. Difficile colitis. No blood cultures on admission. Lactic acidosis up to 3.3. Antibiotics and IV fluids initiated.  Acute metabolic encephalopathy Likely related to infection and delirium.  -Supportive care -Delirium precautions  CKD stage IIIb Baseline hemoglobin of about 1.8-2. Currently worsened slightly and stable this morning. Bump in creatinine possible related to severe sepsis/hypoperfusion. -Daily BMP  Persistent atrial fibrillation Stable rate. Patient is on Eliquis for stroke prevention. -Continue Eliquis  Elevated troponin Likely demand ischemia in setting of sepsis. Mildly elevated  with negative delta. No chest pain. Possible lateral ST depressions noted on initial EKG which are not present on repeat EKG.  Diabetes mellitus type 2 Uncontrolled with hyperglycemia and a hemoglobin A1C of 8.3%. -Continue SSI  Primary hypertension -Continue amlodipine  Anemia of chronic disease Currently at baseline hemoglobin of around 10.   Hyperlipidemia -Continue simvastatin  Chronic compression fractures Located at T12 and L5.  GERD -Continue Protonix  BPH -Continue finasteride -In/Out cath as patient has low urine output   DVT prophylaxis: Eliquis Code Status:   Code Status: Full Code Family Communication: None at bedside. Family updated by Code [facility] team via telephone Disposition Plan: Discharge complicated by cardiac arrest. Transfer to ICU and PCCM care.   Consultants:  None  Procedures:  None  Antimicrobials: Vancomycin PO Flagyl    Subjective: This morning, patient continues to have agitation possibly driven by pain.  Objective: BP (!) 100/49   Pulse 79   Temp 98.5 F (36.9 C) (Axillary)   Resp 15   Ht 5\' 10"  (1.778 m)   SpO2 93%   BMI 25.68 kg/m   Examination:  General exam: Appears calm and comfortable Respiratory system: Diminished bilaterally. Mild tachypnea. Normal effort. Cardiovascular system: S1 & S2 heard. Gastrointestinal system: Abdomen is nondistended, soft and nontender. No organomegaly or masses felt. Normal bowel sounds heard. Central nervous system: Alert but disoriented. Not following commands. Agitated and pulling at all lines/IV/mittens Musculoskeletal: No edema. No calf tenderness Psychiatry: Judgement and insight appear impaired.   Data Reviewed: I have personally reviewed following labs and imaging studies  CBC Lab Results  Component Value Date   WBC 17.6 (H) 09/28/2022   RBC 3.27 (L) 09/28/2022   HGB 10.8 (L) 09/28/2022   HCT 33.3 (L) 09/28/2022   MCV 101.8 (H) 09/28/2022   MCH 33.0 09/28/2022   PLT 197  09/28/2022  MCHC 32.4 09/28/2022   RDW 13.6 09/28/2022   LYMPHSABS 1.1 09/25/2022   MONOABS 0.5 09/25/2022   EOSABS 0.0 09/25/2022   BASOSABS 0.1 [id number]     Last metabolic panel Lab Results  Component Value Date   NA 141 09/28/2022   K 4.4 09/28/2022   CL 112 (H) 09/28/2022   CO2 18 (L) 09/28/2022   BUN 63 (H) 09/28/2022   CREATININE 2.21 (H) 09/28/2022   GLUCOSE 141 (H) 09/28/2022   GFRNONAA 27 (L) 09/28/2022   GFRAA 46 (L) 11/27/2019   CALCIUM 9.2 09/28/2022   PROT 5.5 (L) 09/28/2022   ALBUMIN 3.1 (L) 09/28/2022   BILITOT 1.6 (H) 09/28/2022   ALKPHOS 111 09/28/2022   AST 72 (H) 09/28/2022   ALT 41 09/28/2022   ANIONGAP 11 09/28/2022    GFR: CrCl cannot be calculated (Unknown ideal weight.).  Recent Results (from the past 240 hour(s))  C Difficile Quick Screen w PCR reflex     Status: Abnormal   Collection Time: 09/25/22  1:35 AM   Specimen: STOOL  Result Value Ref Range Status   C Diff antigen POSITIVE (A) NEGATIVE Final   C Diff toxin POSITIVE (A) NEGATIVE Final   C Diff interpretation Toxin producing C. difficile detected.  Final    Comment: CRITICAL RESULTS CALLED TO, READ BACK BY AND VERIFIED WITH RN K.OWENS @0306  ON 09/25/2022 BY NM Performed at [facility], [place] [address] [place], [place] [address]   Gastrointestinal Panel by PCR , Stool     Status: None   Collection Time: 09/25/22  1:35 AM   Specimen: STOOL  Result Value Ref Range Status   Campylobacter species NOT DETECTED NOT DETECTED Final   Plesimonas shigelloides NOT DETECTED NOT DETECTED Final   Salmonella species NOT DETECTED NOT DETECTED Final   Yersinia enterocolitica NOT DETECTED NOT DETECTED Final   Vibrio species NOT DETECTED NOT DETECTED Final   Vibrio cholerae NOT DETECTED NOT DETECTED Final   Enteroaggregative E coli (EAEC) NOT DETECTED NOT DETECTED Final   Enteropathogenic E coli (EPEC) NOT DETECTED NOT DETECTED Final   Enterotoxigenic E coli (ETEC) NOT DETECTED NOT  DETECTED Final   Shiga like toxin producing E coli (STEC) NOT DETECTED NOT DETECTED Final   Shigella/Enteroinvasive E coli (EIEC) NOT DETECTED NOT DETECTED Final   Cryptosporidium NOT DETECTED NOT DETECTED Final   Cyclospora cayetanensis NOT DETECTED NOT DETECTED Final   Entamoeba histolytica NOT DETECTED NOT DETECTED Final   Giardia lamblia NOT DETECTED NOT DETECTED Final   Adenovirus F40/41 NOT DETECTED NOT DETECTED Final   Astrovirus NOT DETECTED NOT DETECTED Final   Norovirus GI/GII NOT DETECTED NOT DETECTED Final   Rotavirus A NOT DETECTED NOT DETECTED Final   Sapovirus (I, II, IV, and V) NOT DETECTED NOT DETECTED Final    Comment: Performed at [facility], [address] [place], [place] [address]  Culture, blood (Routine X 2) w Reflex to ID Panel     Status: None (Preliminary result)   Collection Time: 09/27/22  8:19 AM   Specimen: BLOOD RIGHT ARM  Result Value Ref Range Status   Specimen Description BLOOD RIGHT ARM  Final   Special Requests   Final    BOTTLES DRAWN AEROBIC ONLY Blood Culture results may not be optimal due to an inadequate volume of blood received in culture bottles   Culture   Final    NO GROWTH < 24 HOURS Performed at [facility], [address]  [place], [place] [address]    Report Status PENDING  Incomplete  Culture, blood (Routine X 2) w Reflex to ID Panel     Status: None (Preliminary result)   Collection Time: 09/27/22  8:19 AM   Specimen: BLOOD  Result Value Ref Range Status   Specimen Description BLOOD LEFT ANTECUBITAL  Final   Special Requests   Final    BOTTLES DRAWN AEROBIC ONLY Blood Culture results may not be optimal due to an inadequate volume of blood received in culture bottles   Culture   Final    NO GROWTH < 24 HOURS Performed at [facility], [place] [address] [place], [place] [address]    Report Status PENDING  Incomplete      Radiology Studies: CT ABDOMEN PELVIS WO CONTRAST  Result Date:  09/27/2022 CLINICAL DATA:  Sepsis with abdominal pain. EXAM: CT ABDOMEN AND PELVIS WITHOUT CONTRAST TECHNIQUE: Multidetector CT imaging of the abdomen and pelvis was performed following the standard protocol without IV contrast. RADIATION DOSE REDUCTION: This exam was performed according to the departmental dose-optimization program which includes automated exposure control, adjustment of the mA and/or kV according to patient size and/or use of iterative reconstruction technique. COMPARISON:  09/25/2022 FINDINGS: Lower chest: Bibasilar collapse/consolidation with small bilateral pleural effusions, progressive in the interval. Hepatobiliary: No suspicious focal abnormality in the liver on this study without intravenous contrast. Gallbladder surgically absent. No intrahepatic or extrahepatic biliary dilation. Pancreas: No focal mass lesion. No dilatation of the main duct. No intraparenchymal cyst. No peripancreatic edema. Spleen: No splenomegaly. No focal mass lesion. Adrenals/Urinary Tract: No adrenal nodule or mass. Atrophic kidneys bilaterally without hydronephrosis. No evidence for hydroureter. Contrast material in the bladder lumen is compatible with recent contrast infused CT. Bladder wall trabeculation and right-sided bladder wall diverticuli suggest underlying component of bladder outlet obstruction. Stomach/Bowel: Stomach is decompressed. Duodenum is normally positioned as is the ligament of Treitz. No small bowel wall thickening. No small bowel dilatation. The terminal ileum is normal. The appendix is not well visualized, but there is no edema or inflammation in the region of the cecum. Left-sided colonic diverticular disease noted subtle pericolonic edema/inflammation associated with the sigmoid segment is similar to prior, likely reflecting diverticulitis. No evidence for abscess. Rectal tube noted. Vascular/Lymphatic: There is moderate atherosclerotic calcification of the abdominal aorta without  aneurysm. There is no gastrohepatic or hepatoduodenal ligament lymphadenopathy. No retroperitoneal or mesenteric lymphadenopathy. No pelvic sidewall lymphadenopathy. Reproductive: The prostate gland and seminal vesicles are unremarkable. Other: No intraperitoneal free fluid. Musculoskeletal: Bones are diffusely demineralized. Status post vertebral augmentation at L1 and L4. Compression deformity noted at T12 and L5, similar to prior. IMPRESSION: 1. Left-sided colonic diverticular disease with subtle pericolonic edema/inflammation associated with the sigmoid segment, similar to prior, likely reflecting diverticulitis. No evidence for abscess. 2. Bibasilar collapse/consolidation with small bilateral pleural effusions, progressive in the interval. 3. Bladder wall trabeculation and right-sided bladder wall diverticuli suggest underlying component of bladder outlet obstruction. 4.  Aortic Atherosclerosis (ICD10-I70.0). Electronically Signed   By: Misty Stanley M.D.   On: 09/27/2022 13:24      LOS: 3 days    Cordelia Poche, MD [facility] 09/28/2022, 1:17 PM   If 7PM-7AM, please contact night-coverage [email or web address]

## 2022-09-29 ENCOUNTER — Inpatient Hospital Stay (HOSPITAL_COMMUNITY): Payer: Medicare Other

## 2022-09-29 DIAGNOSIS — J9691 Respiratory failure, unspecified with hypoxia: Secondary | ICD-10-CM | POA: Diagnosis not present

## 2022-09-29 DIAGNOSIS — I469 Cardiac arrest, cause unspecified: Secondary | ICD-10-CM | POA: Diagnosis not present

## 2022-09-29 DIAGNOSIS — A0472 Enterocolitis due to Clostridium difficile, not specified as recurrent: Secondary | ICD-10-CM | POA: Diagnosis not present

## 2022-09-29 DIAGNOSIS — R4182 Altered mental status, unspecified: Secondary | ICD-10-CM | POA: Diagnosis not present

## 2022-09-29 DIAGNOSIS — G9341 Metabolic encephalopathy: Secondary | ICD-10-CM | POA: Diagnosis not present

## 2022-09-29 DIAGNOSIS — Z515 Encounter for palliative care: Secondary | ICD-10-CM

## 2022-09-29 LAB — COMPREHENSIVE METABOLIC PANEL
ALT: 52 U/L — ABNORMAL HIGH (ref 0–44)
AST: 81 U/L — ABNORMAL HIGH (ref 15–41)
Albumin: 2.4 g/dL — ABNORMAL LOW (ref 3.5–5.0)
Alkaline Phosphatase: 78 U/L (ref 38–126)
Anion gap: 9 (ref 5–15)
BUN: 69 mg/dL — ABNORMAL HIGH (ref 8–23)
CO2: 20 mmol/L — ABNORMAL LOW (ref 22–32)
Calcium: 8.8 mg/dL — ABNORMAL LOW (ref 8.9–10.3)
Chloride: 116 mmol/L — ABNORMAL HIGH (ref 98–111)
Creatinine, Ser: 2.35 mg/dL — ABNORMAL HIGH (ref 0.61–1.24)
GFR, Estimated: 25 mL/min — ABNORMAL LOW (ref >60)
Glucose, Bld: 181 mg/dL — ABNORMAL HIGH (ref 70–99)
Potassium: 3.4 mmol/L — ABNORMAL LOW (ref 3.5–5.1)
Sodium: 145 mmol/L (ref 135–145)
Total Bilirubin: 1.4 mg/dL — ABNORMAL HIGH (ref 0.3–1.2)
Total Protein: 4.8 g/dL — ABNORMAL LOW (ref 6.5–8.1)

## 2022-09-29 LAB — POCT I-STAT 7, (LYTES, BLD GAS, ICA,H+H)
Acid-base deficit: 3 mmol/L — ABNORMAL HIGH (ref 0.0–2.0)
Acid-base deficit: 6 mmol/L — ABNORMAL HIGH (ref 0.0–2.0)
Bicarbonate: 17.3 mmol/L — ABNORMAL LOW (ref 20.0–28.0)
Bicarbonate: 17.5 mmol/L — ABNORMAL LOW (ref 20.0–28.0)
Calcium, Ion: 1.21 mmol/L (ref 1.15–1.40)
Calcium, Ion: 1.23 mmol/L (ref 1.15–1.40)
HCT: 26 % — ABNORMAL LOW (ref 39.0–52.0)
HCT: 28 % — ABNORMAL LOW (ref 39.0–52.0)
Hemoglobin: 8.8 g/dL — ABNORMAL LOW (ref 13.0–17.0)
Hemoglobin: 9.5 g/dL — ABNORMAL LOW (ref 13.0–17.0)
O2 Saturation: 100 %
O2 Saturation: 97 %
Patient temperature: 37.4
Patient temperature: 37.5
Potassium: 3.2 mmol/L — ABNORMAL LOW (ref 3.5–5.1)
Potassium: 3.4 mmol/L — ABNORMAL LOW (ref 3.5–5.1)
Sodium: 145 mmol/L (ref 135–145)
Sodium: 146 mmol/L — ABNORMAL HIGH (ref 135–145)
TCO2: 18 mmol/L — ABNORMAL LOW (ref 22–32)
TCO2: 18 mmol/L — ABNORMAL LOW (ref 22–32)
pCO2 arterial: 19.3 mmHg — CL (ref 32–48)
pCO2 arterial: 25.7 mmHg — ABNORMAL LOW (ref 32–48)
pH, Arterial: 7.439 (ref 7.35–7.45)
pH, Arterial: 7.567 — ABNORMAL HIGH (ref 7.35–7.45)
pO2, Arterial: 272 mmHg — ABNORMAL HIGH (ref 83–108)
pO2, Arterial: 89 mmHg (ref 83–108)

## 2022-09-29 LAB — GLUCOSE, CAPILLARY
Glucose-Capillary: 113 mg/dL — ABNORMAL HIGH (ref 70–99)
Glucose-Capillary: 127 mg/dL — ABNORMAL HIGH (ref 70–99)
Glucose-Capillary: 128 mg/dL — ABNORMAL HIGH (ref 70–99)
Glucose-Capillary: 129 mg/dL — ABNORMAL HIGH (ref 70–99)
Glucose-Capillary: 155 mg/dL — ABNORMAL HIGH (ref 70–99)
Glucose-Capillary: 176 mg/dL — ABNORMAL HIGH (ref 70–99)

## 2022-09-29 LAB — CBC
HCT: 31.8 % — ABNORMAL LOW (ref 39.0–52.0)
Hemoglobin: 10.2 g/dL — ABNORMAL LOW (ref 13.0–17.0)
MCH: 31.9 pg (ref 26.0–34.0)
MCHC: 32.1 g/dL (ref 30.0–36.0)
MCV: 99.4 fL (ref 80.0–100.0)
Platelets: 213 10*3/uL (ref 150–400)
RBC: 3.2 MIL/uL — ABNORMAL LOW (ref 4.22–5.81)
RDW: 13.7 % (ref 11.5–15.5)
WBC: 11.4 10*3/uL — ABNORMAL HIGH (ref 4.0–10.5)
nRBC: 0.4 % — ABNORMAL HIGH (ref 0.0–0.2)

## 2022-09-29 LAB — MAGNESIUM: Magnesium: 2.3 mg/dL (ref 1.7–2.4)

## 2022-09-29 LAB — PROCALCITONIN: Procalcitonin: 0.77 ng/mL

## 2022-09-29 LAB — TRIGLYCERIDES: Triglycerides: 102 mg/dL (ref <150)

## 2022-09-29 LAB — PHOSPHORUS: Phosphorus: 3.5 mg/dL (ref 2.5–4.6)

## 2022-09-29 MED ORDER — FENTANYL BOLUS VIA INFUSION: 25.0000 ug | INTRAVENOUS | Status: DC | PRN

## 2022-09-29 MED ORDER — SODIUM CHLORIDE 0.9 % IV SOLN: INTRAVENOUS | Status: DC | PRN

## 2022-09-29 MED ORDER — LIDOCAINE HCL (PF) 1 % IJ SOLN
INTRAMUSCULAR | Status: AC
  Filled 2022-09-29: qty 5

## 2022-09-29 MED ORDER — LIDOCAINE HCL (PF) 1 % IJ SOLN
INTRAMUSCULAR | Status: AC
  Administered 2022-09-29: 10 mL
  Filled 2022-09-29: qty 5

## 2022-09-29 MED ORDER — FENTANYL CITRATE PF 50 MCG/ML IJ SOSY
25.0000 ug | PREFILLED_SYRINGE | Freq: Once | INTRAMUSCULAR | Status: AC
  Administered 2022-09-29: 25 ug via INTRAVENOUS

## 2022-09-29 MED ORDER — PANTOPRAZOLE 2 MG/ML SUSPENSION
40.0000 mg | Freq: Every day | ORAL | Status: DC
  Administered 2022-09-29 – 2022-10-02 (×4): 40 mg
  Filled 2022-09-29 (×4): qty 20

## 2022-09-29 MED ORDER — POTASSIUM CHLORIDE 10 MEQ/50ML IV SOLN
10.0000 meq | INTRAVENOUS | Status: AC
  Administered 2022-09-29 (×4): 10 meq via INTRAVENOUS
  Filled 2022-09-29 (×4): qty 50

## 2022-09-29 MED ORDER — LIDOCAINE HCL (PF) 1 % IJ SOLN: 10.0000 mL | Freq: Once | INTRAMUSCULAR | Status: AC

## 2022-09-29 MED ORDER — APIXABAN 2.5 MG PO TABS
2.5000 mg | ORAL_TABLET | Freq: Two times a day (BID) | ORAL | Status: DC
  Administered 2022-09-29: 2.5 mg via ORAL
  Filled 2022-09-29: qty 1

## 2022-09-29 MED ORDER — APIXABAN 2.5 MG PO TABS
2.5000 mg | ORAL_TABLET | Freq: Two times a day (BID) | ORAL | Status: DC
  Administered 2022-09-30 – 2022-10-13 (×25): 2.5 mg
  Filled 2022-09-29 (×26): qty 1

## 2022-09-29 MED ORDER — VITAL AF 1.2 CAL PO LIQD
1000.0000 mL | ORAL | Status: DC
  Administered 2022-09-29: 1000 mL

## 2022-09-29 MED ORDER — ACETAMINOPHEN 650 MG RE SUPP: 650.0000 mg | Freq: Four times a day (QID) | RECTAL | Status: DC | PRN

## 2022-09-29 MED ORDER — FENTANYL 2500MCG IN NS 250ML (10MCG/ML) PREMIX INFUSION
25.0000 ug/h | INTRAVENOUS | Status: DC
  Administered 2022-09-29: 25 ug/h via INTRAVENOUS
  Administered 2022-09-30: 125 ug/h via INTRAVENOUS
  Filled 2022-09-29 (×3): qty 250

## 2022-09-29 MED ORDER — ACETAMINOPHEN 325 MG PO TABS
650.0000 mg | ORAL_TABLET | Freq: Four times a day (QID) | ORAL | Status: DC | PRN
  Administered 2022-10-06 – 2022-10-12 (×6): 650 mg
  Filled 2022-09-29 (×5): qty 2

## 2022-09-29 MED ORDER — VITAL HIGH PROTEIN PO LIQD: 1000.0000 mL | ORAL | Status: DC

## 2022-09-29 NOTE — Progress Notes (Signed)
PT Cancellation Note  Patient Details Name: Daniel Cooke MRN: [id number] DOB: July 12, 1924   Cancelled Treatment:    Reason Eval/Treat Not Completed: Other (comment);Medical issues which prohibited therapy (Sedated on vent. Will check back later in the week for medical readiness.))   Alvira Philips 09/29/2022, 8:49 AM Emelio Schneller M,PT Acute Rehab Services [phone]

## 2022-09-29 NOTE — Progress Notes (Signed)
SLP Cancellation Note  Patient Details Name: Daniel Cooke MRN: [id number] DOB: 12/22/1923   Cancelled treatment:       Reason Eval/Treat Not Completed: Medical issues which prohibited therapy;Other (comment) (patient currently on vent. SLP will continue to follow.)  Sonia Baller, MA, CCC-SLP Speech Therapy

## 2022-09-29 NOTE — Procedures (Signed)
Patient Name: LUKE FALERO  MRN: [id number]  Epilepsy Attending: Lora Havens  Referring Physician/Provider: Candee Furbish, MD  Date: 09/28/2022 Duration: 24.01 mins  Patient history: 88 year old male status post cardiac arrest.  EEG to evaluate for seizure.  Level of alertness:  comatose  AEDs during EEG study: Propofol  Technical aspects: This EEG study was done with scalp electrodes positioned according to the 10-20 International system of electrode placement. Electrical activity was reviewed with band pass filter of 1-70Hz , sensitivity of 7 uV/mm, display speed of 28mm/sec with a 60Hz  notched filter applied as appropriate. EEG data were recorded continuously and digitally stored.  Video monitoring was available and reviewed as appropriate.  Description: EEG showed burst suppression pattern with bursts of 3 to 5 Hz theta- delta slowing admixed with 12 to 14 Hz beta activity lasting 2 to 3 seconds admixed with background suppression lasting 2 to 4 seconds. Hyperventilation and photic stimulation were not performed.     ABNORMALITY -Burst suppression, generalized  IMPRESSION: This study is suggestive of profound diffuse encephalopathy, nonspecific etiology but could be secondary to sedation. No seizures or epileptiform discharges were seen throughout the recording.  Hammond Obeirne Barbra Sarks

## 2022-09-29 NOTE — Progress Notes (Signed)
OT Cancellation Note  Patient Details Name: NAVIN DOGAN MRN: [id number] DOB: Mar 06, 1924   Cancelled Treatment:    Reason Eval/Treat Not Completed: Patient not medically ready (Sedated on vent. Will check back later in the week for medical readiness.)  [facility] 09/29/2022, 8:07 AM Maurie Boettcher, OT/L   Acute OT Clinical Specialist Acute Rehabilitation Services Pager [phone] Office [phone]

## 2022-09-29 NOTE — Progress Notes (Signed)
Initial Nutrition Assessment  DOCUMENTATION CODES:   Not applicable  INTERVENTION:   Initiate trickle tube feeds via OG tube: - Vital AF 1.2 @ 20 ml/hr (480 ml/day)  Trickle tube feeding regimen provides 576 kcal, 36 grams of protein, and 389 ml of H2O.    RD will monitor for ability to advance tube feeds to goal: - Vital AF 1.2 @ 60 ml/hr (1440 ml/day)  Recommended tube feeding regimen at goal rate would provide 1728 kcal, 108 grams of protein, and 1168 ml of H2O.   NUTRITION DIAGNOSIS:   Inadequate oral intake related to inability to eat as evidenced by NPO status.  GOAL:   Patient will meet greater than or equal to 90% of their needs  MONITOR:   Vent status, Labs, Weight trends, TF tolerance, I & O's  REASON FOR ASSESSMENT:   Ventilator, Consult Enteral/tube feeding initiation and management (trickle tube feeds)  ASSESSMENT:   87 year old male who presented to the ED on 10/13 wit AMS. PMH of HTN, atrial fibrillation, CKD stage IIIb, T2DM, BPH, recent hospitalization for acute diverticulitis of the sigmoid colon. Pt admitted with C. difficile colitis, SIRS, acute metabolic encephalopathy.  10/16 - Code Blue, intubated 10/17 - chest tube placed for R pneumothorax  Discussed pt with RN and during ICU rounds. Consult received for trickle tube feeding initiation and management. Pt with OG tube in stomach per abdominal x-ray on 10/16.  Pt previously on a dysphagia 1 diet with thin liquids. Noted meal completions of 25-60% documented on 10/14. PO was documented as 0% for all meals on 10/15.  Unable to obtain diet and weight history at this time. Reviewed weight history in chart. Weight fairly stable compared to weight from 14 months ago.  Pt currently with moderate pitting edema to BUE and BLE.  Patient is currently intubated on ventilator support MV: 9.7 L/min Temp (24hrs), Avg:98.6 F (37 C), Min:97.7 F (36.5 C), Max:99.5 F (37.5 C) BP (a-line): 95/65 MAP  (a-line): 80  Drips: Propofol: off this AM Fentanyl Levophed  Medications reviewed and include: SSI q 4 hours, protonix, IV abx, IV KCl 10 mEq x 4 runs  Labs reviewed: potassium 3.4, BUN 69, creatinine 2.35, elevated LFTs, WBC 11.4 CBG's: 129-204 x 24 hours  UOP: 375 ml x 12 hours OGT: 50 ml x 12 hours Stool: 150 ml x 12 hours via rectal tube Chest tube: 300 ml x 12 hours I/O's: +6.5 L since admit  NUTRITION - FOCUSED PHYSICAL EXAM:  Flowsheet Row Most Recent Value  Orbital Region No depletion  Upper Arm Region No depletion  Thoracic and Lumbar Region Mild depletion  Buccal Region Unable to assess  Temple Region No depletion  Clavicle Bone Region Mild depletion  Clavicle and Acromion Bone Region Moderate depletion  Scapular Bone Region No depletion  Dorsal Hand No depletion  Patellar Region No depletion  Anterior Thigh Region No depletion  Posterior Calf Region No depletion  Edema (RD Assessment) Mild  Hair Reviewed  Eyes Reviewed  Mouth Reviewed  Skin Reviewed  Nails Reviewed       Diet Order:   Diet Order             Diet NPO time specified  Diet effective now                   EDUCATION NEEDS:   No education needs have been identified at this time  Skin:  Skin Assessment: Reviewed RN Assessment  Last BM:  09/29/22 rectal  tube  Height:   Ht Readings from Last 1 Encounters:  09/28/22 5\' 10"  (1.778 m)    Weight:   Wt Readings from Last 1 Encounters:  09/28/22 79 kg    BMI:  Body mass index is 24.99 kg/m.  Estimated Nutritional Needs:   Kcal:  1700-1900  Protein:  90-110 grams  Fluid:  1.7-1.9 L    Gustavus Bryant, MS, RD, LDN Inpatient Clinical Dietitian Please see AMiON for contact information.

## 2022-09-29 NOTE — Procedures (Addendum)
Insertion of Chest Tube Procedure Note  Daniel Cooke  [id number]  02/06/1924  Date:09/29/22  Time:3:36 AM    Provider Performing: Collier Bullock   Procedure: Chest Tube Insertion (61443)  Indication(s) Pneumothorax  Consent Risks of the procedure as well as the alternatives and risks of each were explained to the patient and/or caregiver.  Consent for the procedure was obtained and is signed in the bedside chart Obtainted from daughter [name] over telephone.   Anesthesia Topical only with 1% lidocaine    Time Out Verified patient identification, verified procedure, site/side was marked, verified correct patient position, special equipment/implants available, medications/allergies/relevant history reviewed, required imaging and test results available.   Sterile Technique Maximal sterile technique including full sterile barrier drape, hand hygiene, sterile gown, sterile gloves, mask, hair covering, sterile ultrasound probe cover (if used).   Procedure Description Ultrasound not used to identify appropriate pleural anatomy for placement and overlying skin marked. Area of placement cleaned and draped in sterile fashion.  A 14 French pigtail pleural catheter was placed into the right pleural space using Seldinger technique. Appropriate return of air and serous (pink tinged) was obtained.  The tube was connected to atrium and placed on -20 cm H2O wall suction. Air return immediately noted, 250 cc fluid as well.   No significant change in pulmonary dynamics (initial peak pressure 25 --> 22).   Also decreased peep from 10 to 7 initially, prior to starting procedure.   Complications/Tolerance None; patient tolerated the procedure well. Chest X-ray is ordered to verify placement.   EBL none  Specimen(s) none

## 2022-09-29 NOTE — Consult Note (Signed)
Consultation Note Date: 09/29/2022   Patient Name: Daniel Cooke  DOB: 1924/05/16  MRN: [id number]  Age / Sex: 88 y.o., male  PCP: Glenis Smoker, MD Referring Physician: Marshell Garfinkel, MD  Reason for Consultation: Establishing goals of care and Psychosocial/spiritual support  HPI/Patient Profile:  88 year old man with hx of CKD, HLD, DM2 but otherwise fully functional who presented with recurrent colitis found to have C difficile infection.  Hospital course complicated by delirium.  Unfortunately today had what seems to be a aspiration event then had cardiac arrest.  ROSC within 10 mins.   PCCM consulted to assist with post-CPR management.  Intubated and not following commands.  Patient remains intubated, requiring pressor support, right sided pneumothorax chest tube placed overnight     Family face treatment option decisions, Durene Fruits directive decisions and anticipatory care needs.  Clinical Assessment and Goals of Care:  This NP Wadie Lessen reviewed medical records, received report from team, assessed the patient and then meet at the patient's bedside along with her 2 daughters Macy Mis, Russella Dar and caregiver Nellie to discuss diagnosis, prognosis, GOC, EOL wishes disposition and options.    Concept of Palliative Care was introduced as specialized medical care for people and their families living with serious illness.  If focuses on providing relief from the symptoms and stress of a serious illness.  The goal is to improve quality of life for both the patient and the family.Values and goals of care important to patient and family were attempted to be elicited.    Created space and opportunity for patient  and family to explore thoughts and feelings regarding current medical situation.   Daughter is at bedside very much remain hopeful for improvement, they are open to all offered and  available medical intervention to prolong life they tell me "their father is "strong and a Nurse, adult".     A  discussion was had today regarding advanced directives.  Concepts specific to code status, artifical feeding and hydration, continued IV antibiotics and rehospitalization was had.  The difference between a aggressive medical intervention path  and a palliative comfort care path for this patient at this time was had.     MOST form introduced    Questions and concerns addressed.  Patient  encouraged to call with questions or concerns.     PMT will continue to support holistically.          No documented healthcare power of attorney or advanced care planning documents.  Patient's 2 daughters present today decision-makers for their father.       SUMMARY OF RECOMMENDATIONS    Code Status/Advance Care Planning: Full code Educated patient/family to consider DNR/DNI status understanding evidenced based poor outcomes in similar hospitalized patient, as the cause of arrest is likely associated with advanced chronic illness rather than an easily reversible acute cardio-pulmonary event.    Palliative Prophylaxis:  Eye Care, Frequent Pain Assessment, and Oral Care  Additional Recommendations (Limitations, Scope, Preferences): Full Scope Treatment  Psycho-social/Spiritual:  Desire  for further Chaplaincy support:no Additional Recommendations: Education on Hospice  Prognosis:  Unable to determine  Discharge Planning: To Be Determined      Primary Diagnoses: Present on Admission:  C. difficile colitis  Leukocytosis  SIRS (systemic inflammatory response syndrome) (HCC)  Acute metabolic encephalopathy  Anemia of chronic disease  HLD (hyperlipidemia)  HTN (hypertension)  Persistent atrial fibrillation (HCC)  Type 2 diabetes mellitus with stage 3 chronic kidney disease, without long-term current use of insulin (HCC)  Compression fracture of body of thoracic vertebra (HCC)   GERD (gastroesophageal reflux disease)  Elevated troponin   I have reviewed the medical record, interviewed the patient and family, and examined the patient. The following aspects are pertinent.  Past Medical History:  Diagnosis Date   Arthritis    "was in left knee before replacement; now have it in my right knee" (11/25/2016)   BPH (benign prostatic hypertrophy)    Chronic diastolic CHF (congestive heart failure) ([place])    a. 02/2016 Echo: EF 60-65%, no rwma, triv AI, mild MR, mildly to mod dil LA, mod dil RA, PASP 65mmHg.   Dysrhythmia    Essential hypertension    Hard of hearing    wears bilateral hearing aids   Hypercholesterolemia    Osteoarthritis    PAF (paroxysmal atrial fibrillation) ([place])    a. CHADS2VASC score of 4 --> coumadin.   Pilonidal cyst    PAST HX - NO PROBLEM NOW   Pneumonia    "one time; years ago" (11/25/2016)   Shingles    "long time ago"   Synovitis of knee 02/27/2014   Type II diabetes mellitus (HCC)    oral meds - no insulin   Social History   Socioeconomic History   Marital status: Married    Spouse name: Not on file   Number of children: Not on file   Years of education: Not on file   Highest education level: Not on file  Occupational History   Not on file  Tobacco Use   Smoking status: Never   Smokeless tobacco: Never  Vaping Use   Vaping Use: Never used  Substance and Sexual Activity   Alcohol use: No   Drug use: No   Sexual activity: Not on file  Other Topics Concern   Not on file  Social History Narrative   Not on file   Social Determinants of Health   Financial Resource Strain: Not on file  Food Insecurity: No Food Insecurity (09/12/2022)   Hunger Vital Sign    Worried About Running Out of Food in the Last Year: Never true    Ran Out of Food in the Last Year: Never true  Transportation Needs: No Transportation Needs (09/13/2022)   PRAPARE - Hydrologist (Medical): No    Lack of Transportation  (Non-Medical): No  Physical Activity: Not on file  Stress: Not on file  Social Connections: Not on file   Family History  Problem Relation Age of Onset   Heart disease Mother    Heart disease Brother    Heart attack Brother    Scheduled Meds:  apixaban  2.5 mg Oral BID   Chlorhexidine Gluconate Cloth  6 each Topical Daily   insulin aspart  0-9 Units Subcutaneous Q4H   mouth rinse  15 mL Mouth Rinse Q2H   pantoprazole  40 mg Per Tube QPC supper   sodium chloride flush  10-40 mL Intracatheter Q12H   sodium chloride flush  3 mL  Intravenous Q12H   vancomycin  125 mg Per Tube QID   Continuous Infusions:  sodium chloride Stopped (09/29/22 1039)   ampicillin-sulbactam (UNASYN) IV Stopped (09/29/22 0435)   feeding supplement (VITAL AF 1.2 CAL)     fentaNYL infusion INTRAVENOUS 25 mcg/hr (09/29/22 1500)   metronidazole Stopped (09/29/22 1145)   norepinephrine (LEVOPHED) Adult infusion 2 mcg/min (09/29/22 1500)   propofol (DIPRIVAN) infusion Stopped (09/29/22 1144)   PRN Meds:.sodium chloride, acetaminophen **OR** acetaminophen, albuterol, fentaNYL, fentaNYL (SUBLIMAZE) injection, fentaNYL (SUBLIMAZE) injection, mouth rinse, polyvinyl alcohol, sodium chloride flush Medications Prior to Admission:  Prior to Admission medications   Medication Sig Start Date End Date Taking? Authorizing Provider  acetaminophen (TYLENOL) 500 MG tablet Take 500 mg by mouth 2 (two) times daily.   Yes [provider]  amLODipine (NORVASC) 2.5 MG tablet Take 2.5 mg by mouth daily. 05/06/21  Yes [provider]  apixaban (ELIQUIS) 2.5 MG TABS tablet Take 1 tablet by mouth twice daily 07/01/22  Yes Camnitz, Will Daphine Deutscher, MD  CALCIUM PO Take 600 mg by mouth daily.   Yes [provider]  carboxymethylcellulose (REFRESH PLUS) 0.5 % SOLN Place 1 drop into both eyes daily as needed (dry eyes).   Yes [provider]  Cholecalciferol (VITAMIN D3 PO) Take 1,000 Units by mouth daily.   Yes  [provider]  finasteride (PROSCAR) 5 MG tablet Take 5 mg by mouth daily. 05/01/19  Yes [provider]  furosemide (LASIX) 40 MG tablet Take 1/2 (one-half) tablet by mouth once daily Patient taking differently: Take 20 mg by mouth daily. 09/16/22  Yes Camnitz, Will Daphine Deutscher, MD  HUMALOG KWIKPEN 100 UNIT/ML KwikPen Inject 2 Units into the skin daily with supper.  12/02/18  Yes [provider]  Insulin Glargine (LANTUS SOLOSTAR) 100 UNIT/ML Solostar Pen Inject 4 Units into the skin at bedtime. Patient taking differently: Inject 12-16 Units into the skin at bedtime. 11/27/19  Yes Kc, Dayna Barker, MD  MYRBETRIQ 50 MG TB24 tablet Take 50 mg by mouth at bedtime. 03/14/21  Yes [provider]  omeprazole (PRILOSEC) 20 MG capsule Take 20 mg by mouth every morning. 08/13/22  Yes [provider]  simvastatin (ZOCOR) 20 MG tablet Take 20 mg by mouth at bedtime.   Yes [provider]  traZODone (DESYREL) 50 MG tablet Take 50 mg by mouth at bedtime. 12/04/19  Yes [provider]  Continuous Blood Gluc Receiver (FREESTYLE LIBRE 14 DAY READER) DEVI See admin instructions. 12/01/19   [provider]  Insulin Pen Needle (BD PEN NEEDLE NANO U/F) 32G X 4 MM MISC USE AS DIRECTED ONCE DAILY FOR 30 DAYS 10/28/18   [provider]   Allergies  Allergen Reactions   Codeine Nausea And Vomiting and Other (See Comments)    MAKES ME CRAZY   Oxycodone Nausea And Vomiting and Other (See Comments)    MAKES ME CRAZY   Hydrocodone Nausea Only and Other (See Comments)    Daughter states "messes him up mentally"   Nsaids     Other reaction(s): Contraindicated d/t CKD   Other Other (See Comments)    All narcotic pain medicine makes him crazy.    Oxybutynin     Other reaction(s): dry mouth   Tramadol Hcl     Other reaction(s): altered mental status   Review of Systems  Physical Exam  Vital Signs: BP (!) 105/50   Pulse 75   Temp 98.2 F (36.8  C)   Resp 12  Ht 5\' 10"  (1.778 m)   Wt 79 kg   SpO2 100%   BMI 24.99 kg/m  Pain Scale: CPOT   Pain Score: Asleep   SpO2: SpO2: 100 % O2 Device:SpO2: 100 % O2 Flow Rate: .O2 Flow Rate (L/min): 7 L/min  IO: Intake/output summary:  Intake/Output Summary (Last 24 hours) at 09/29/2022 1558 Last data filed at 09/29/2022 1500 Gross per 24 hour  Intake 3132.54 ml  Output 995 ml  Net 2137.54 ml    LBM: Last BM Date : 09/28/22 Baseline Weight: Weight: 79 kg Most recent weight: Weight: 79 kg     Palliative Assessment/Data:     Discussed with Dr. Vaughan Browner and bedside RN  Time In: 1400 Time Out: 1530 Time Total: 90 minutes Greater than 50%  of this time was spent counseling and coordinating care related to the above assessment and plan.  Signed by: Wadie Lessen, NP   Please contact Palliative Medicine Team phone at [phone] for questions and concerns.  For individual provider: See Shea Evans

## 2022-09-29 NOTE — Progress Notes (Signed)
NAME:  Daniel Cooke, MRN:  [id number], DOB:  10/19/1924, LOS: 4 ADMISSION DATE:  09/25/2022, CONSULTATION DATE:  09/28/22 REFERRING MD:  Code team, CHIEF COMPLAINT:  arrest   History of Present Illness:  88 year old man with hx of CKD, HLD, DM2 but otherwise fully functional who presented with recurrent colitis found to have C difficile infection.  Hospital course complicated by delirium.  Unfortunately today had what seems to be a aspiration event then had cardiac arrest.  ROSC within 10 mins.  Overbreathing vent.  PCCM consulted to assist with post-CPR management.  Intubated and not following commands.  Pertinent  Medical History   Past Medical History:  Diagnosis Date   Arthritis    "was in left knee before replacement; now have it in my right knee" (11/25/2016)   BPH (benign prostatic hypertrophy)    Chronic diastolic CHF (congestive heart failure) (HCC)    a. 02/2016 Echo: EF 60-65%, no rwma, triv AI, mild MR, mildly to mod dil LA, mod dil RA, PASP .   Dysrhythmia    Essential hypertension    Hard of hearing    wears bilateral hearing aids   Hypercholesterolemia    Osteoarthritis    PAF (paroxysmal atrial fibrillation) (HCC)    a. CHADS2VASC score of 4 --> coumadin.   Pilonidal cyst    PAST HX - NO PROBLEM NOW   Pneumonia    "one time; years ago" (11/25/2016)   Shingles    "long time ago"   Synovitis of knee 02/27/2014   Type II diabetes mellitus (HCC)    oral meds - no insulin    Significant Hospital Events: Including procedures, antibiotic start and stop dates in addition to other pertinent events   10/13 admit 10/16 cardiac arrest  Interim History / Subjective:   Right-sided pneumothorax, chest tube placed overnight.  Remains on the ventilator  Objective   Blood pressure (!) 120/54, pulse 65, temperature 98.2 F (36.8 C), resp. rate 16, height 5\' 10"  (1.778 m), weight 79 kg, SpO2 100 %.    Vent Mode: PSV;CPAP FiO2 (%):  [40 %-100 %] 40 % Set Rate:   [15 bmp-30 bmp] 15 bmp Vt Set:  [510 mL-580 mL] 510 mL PEEP:  [5 cmH20-10 cmH20] 5 cmH20 Pressure Support:  [8 cmH20] 8 cmH20 Plateau Pressure:  [10 cmH20-25 cmH20] 25 cmH20   Intake/Output Summary (Last 24 hours) at 09/29/2022 0827 Last data filed at 09/29/2022 0700 Gross per 24 hour  Intake 2553 ml  Output 875 ml  Net 1678 ml   Filed Weights   09/28/22 1600  Weight: 79 kg    Examination: Gen:      No acute distress HEENT:  EOMI, sclera anicteric, Subq emphysema is improving Neck:     No masses; no thyromegaly, ETT Lungs:    Clear to auscultation bilaterally; normal respiratory effort. Chest tube to suction- no air leak CV:         Regular rate and rhythm; no murmurs Abd:      + bowel sounds; soft, non-tender; no palpable masses, no distension Ext:    No edema; adequate peripheral perfusion Skin:      Warm and dry; no rash Neuro: Unresponsive  Labs/imaging personally reviewed Significant for potassium 3.4, BUN/creatinine 69/2.35 WBC 11.4, hemoglobin 10.2, platelets 213   Resolved Hospital Problem list   N/A  Assessment & Plan:  PEA IHCA with timing and imaging most c/w aspiration event in setting of critical illness delirium.  Time  of code ~10 mins.  Not following commands on ROSC.   Echo reviewed with preserved ejection fraction, pulmonary hypertension noted which is unchanged compared to 2017 Continue telemetry monitoring Maintain normothermia Wean pressors  Acute respiratory failure secondary to cardiac arrest Right sided pneumothorax Continue protective lung ventilation Chest tube to suction Follow daily chest x-ray  C. difficile colitis P.o. Vanco and Flagyl  Acute metabolic encephalopathy, question for anoxic injury Monitor neuro status CT head with no acute abnormality.  EEG results are pending Unasyn for aspiration.  Follow cultures  Diabetes mellitus SSI  AKI on CKD Follow urine output and Cr  Elevated LFTs, shock liver Follow labs  Best  Practice (right click and "Reselect all SmartList Selections" daily)   Diet/type: NPO DVT prophylaxis: prophylactic heparin  GI prophylaxis: PPI Lines: Central line Foley:  Yes, and it is still needed Code Status:  full code Last date of multidisciplinary goals of care discussion [will ask PMT to see]  Critical care time:    The patient is critically ill with multiple organ system failure and requires high complexity decision making for assessment and support, frequent evaluation and titration of therapies, advanced monitoring, review of radiographic studies and interpretation of complex data.   Critical Care Time devoted to patient care services, exclusive of separately billable procedures, described in this note is 35 minutes.   Marshell Garfinkel MD [facility] See Amion for pager  If no response to pager , please call [phone] until 7pm After 7:00 pm call Elink  [phone] 09/29/2022, 8:27 AM

## 2022-09-29 NOTE — Progress Notes (Signed)
eLink Physician-Brief Progress Note Patient Name: MATILDE MARKIE DOB: 1924-01-02 MRN: [id number]   Date of Service  09/29/2022  HPI/Events of Note  Review of CXR reveals increased size of the right pneumothorax which is now moderate-large with signs of leftward mediastinal shift. Left mid and lower lung airspace opacities. Extensive chest wall emphysema.  eICU Interventions  PCCM ground team is already aware and will address at bedside.      Intervention Category Major Interventions: Other:  Mihcael Ledee Cornelia Copa 09/29/2022, 2:03 AM

## 2022-09-29 NOTE — Progress Notes (Signed)
eLink Physician-Brief Progress Note Patient Name: FINDLEY VI DOB: [birth year] MRN: [id number]   Date of Service  09/29/2022  HPI/Events of Note  ABG on 100%/PRVC 30/TV 580/P 10 = 7.56/19.3/272/17.5   eICU Interventions  Plan: Decrease PRVC rate to 15. Repeat ABG at 2 AM.     Intervention Category Major Interventions: Respiratory failure - evaluation and management;Acid-Base disturbance - evaluation and management  Ezell Melikian Eugene 09/29/2022, 12:32 AM

## 2022-09-30 ENCOUNTER — Inpatient Hospital Stay (HOSPITAL_COMMUNITY): Payer: Medicare Other

## 2022-09-30 DIAGNOSIS — G9341 Metabolic encephalopathy: Secondary | ICD-10-CM | POA: Diagnosis not present

## 2022-09-30 DIAGNOSIS — J939 Pneumothorax, unspecified: Secondary | ICD-10-CM | POA: Diagnosis not present

## 2022-09-30 DIAGNOSIS — J690 Pneumonitis due to inhalation of food and vomit: Secondary | ICD-10-CM

## 2022-09-30 DIAGNOSIS — A0472 Enterocolitis due to Clostridium difficile, not specified as recurrent: Secondary | ICD-10-CM | POA: Diagnosis not present

## 2022-09-30 DIAGNOSIS — Z515 Encounter for palliative care: Secondary | ICD-10-CM | POA: Diagnosis not present

## 2022-09-30 DIAGNOSIS — I469 Cardiac arrest, cause unspecified: Secondary | ICD-10-CM | POA: Diagnosis not present

## 2022-09-30 DIAGNOSIS — Z9911 Dependence on respirator [ventilator] status: Secondary | ICD-10-CM

## 2022-09-30 DIAGNOSIS — Z7189 Other specified counseling: Secondary | ICD-10-CM

## 2022-09-30 LAB — COMPREHENSIVE METABOLIC PANEL
ALT: 39 U/L (ref 0–44)
AST: 36 U/L (ref 15–41)
Albumin: 2.2 g/dL — ABNORMAL LOW (ref 3.5–5.0)
Alkaline Phosphatase: 70 U/L (ref 38–126)
Anion gap: 10 (ref 5–15)
BUN: 74 mg/dL — ABNORMAL HIGH (ref 8–23)
CO2: 20 mmol/L — ABNORMAL LOW (ref 22–32)
Calcium: 9.3 mg/dL (ref 8.9–10.3)
Chloride: 114 mmol/L — ABNORMAL HIGH (ref 98–111)
Creatinine, Ser: 2.31 mg/dL — ABNORMAL HIGH (ref 0.61–1.24)
GFR, Estimated: 25 mL/min — ABNORMAL LOW (ref >60)
Glucose, Bld: 164 mg/dL — ABNORMAL HIGH (ref 70–99)
Potassium: 3.9 mmol/L (ref 3.5–5.1)
Sodium: 144 mmol/L (ref 135–145)
Total Bilirubin: 0.9 mg/dL (ref 0.3–1.2)
Total Protein: 4.3 g/dL — ABNORMAL LOW (ref 6.5–8.1)

## 2022-09-30 LAB — CBC
HCT: 31.2 % — ABNORMAL LOW (ref 39.0–52.0)
Hemoglobin: 9.8 g/dL — ABNORMAL LOW (ref 13.0–17.0)
MCH: 31.9 pg (ref 26.0–34.0)
MCHC: 31.4 g/dL (ref 30.0–36.0)
MCV: 101.6 fL — ABNORMAL HIGH (ref 80.0–100.0)
Platelets: 195 10*3/uL (ref 150–400)
RBC: 3.07 MIL/uL — ABNORMAL LOW (ref 4.22–5.81)
RDW: 14.3 % (ref 11.5–15.5)
WBC: 10.2 10*3/uL (ref 4.0–10.5)
nRBC: 0.3 % — ABNORMAL HIGH (ref 0.0–0.2)

## 2022-09-30 LAB — GLUCOSE, CAPILLARY
Glucose-Capillary: 160 mg/dL — ABNORMAL HIGH (ref 70–99)
Glucose-Capillary: 143 mg/dL — ABNORMAL HIGH (ref 70–99)
Glucose-Capillary: 197 mg/dL — ABNORMAL HIGH (ref 70–99)
Glucose-Capillary: 197 mg/dL — ABNORMAL HIGH (ref 70–99)
Glucose-Capillary: 242 mg/dL — ABNORMAL HIGH (ref 70–99)
Glucose-Capillary: 200 mg/dL — ABNORMAL HIGH (ref 70–99)

## 2022-09-30 LAB — MAGNESIUM: Magnesium: 2.4 mg/dL (ref 1.7–2.4)

## 2022-09-30 LAB — PHOSPHORUS: Phosphorus: 3.4 mg/dL (ref 2.5–4.6)

## 2022-09-30 MED ORDER — VITAL AF 1.2 CAL PO LIQD
1000.0000 mL | ORAL | Status: DC
  Administered 2022-10-01 – 2022-10-02 (×3): 1000 mL

## 2022-09-30 MED ORDER — FIDAXOMICIN 200 MG PO TABS
200.0000 mg | ORAL_TABLET | Freq: Two times a day (BID) | ORAL | Status: DC
  Administered 2022-09-30 – 2022-10-13 (×24): 200 mg via NASOGASTRIC
  Filled 2022-09-30 (×27): qty 1

## 2022-09-30 MED ORDER — SODIUM BICARBONATE 650 MG PO TABS
650.0000 mg | ORAL_TABLET | Freq: Three times a day (TID) | ORAL | Status: DC
  Administered 2022-09-30 – 2022-10-06 (×16): 650 mg
  Filled 2022-09-30 (×18): qty 1

## 2022-09-30 MED ORDER — PIPERACILLIN-TAZOBACTAM 3.375 G IVPB
3.3750 g | Freq: Three times a day (TID) | INTRAVENOUS | Status: AC
  Administered 2022-09-30 – 2022-10-06 (×20): 3.375 g via INTRAVENOUS
  Filled 2022-09-30 (×20): qty 50

## 2022-09-30 NOTE — Progress Notes (Signed)
Pt transported from 3M12 to MRI and back with no complications noted.

## 2022-09-30 NOTE — Progress Notes (Signed)
This chaplain attempted spiritual care visit. The Pt. daughters and caregiver were not at the Pt. bedside. Chaplain revisited the spiritual care consult with PMT NP-Mary. Revisit planned for Thursday.  Chaplain Sallyanne Kuster [phone]

## 2022-09-30 NOTE — Progress Notes (Signed)
PT Cancellation Note  Patient Details Name: Daniel Cooke MRN: [id number] DOB: Apr 13, 1924   Cancelled Treatment:    Reason Eval/Treat Not Completed: Other (comment) (Pt continues to be unresponsive.  Per nurse, not appropriate for PT today.)   Alvira Philips 09/30/2022, 10:21 AM Santresa Levett M,PT [place] [phone]

## 2022-09-30 NOTE — Progress Notes (Signed)
EEG complete - results pending 

## 2022-09-30 NOTE — Progress Notes (Signed)
Brief Nutrition Note  Discussed pt with RN and during ICU rounds. Discussed pt with CCM MD. Pt tolerating trickle tube feeds via OG tube. Okay to advance tube feeding regimen to goal. Consult received for enteral nutrition initiation and management.  RD to adjust tube feeding order: - Increase rate of Vital AF 1.2 to 30 ml/hr and continue to advance by 10 ml q 6 hours to goal rate of 60 ml/hr (1440 ml/day)  Tube feeding regimen at goal rate provides 1728 kcal, 108 grams of protein, and 1168 ml of H2O.  RD will continue to follow pt during admission.   Gustavus Bryant, MS, RD, LDN Inpatient Clinical Dietitian Please see AMiON for contact information.

## 2022-09-30 NOTE — Progress Notes (Signed)
Physical Therapy Discharge Patient Details Name: Daniel Cooke MRN: [id number] DOB: Nov 30, 1924 Today's Date: 09/30/2022 Time:  -     Patient discharged from PT services secondary to medical decline - will need to re-order PT to resume therapy services.  Please see latest therapy progress note for current level of functioning and progress toward goals.    Progress and discharge plan discussed with patient and/or caregiver: Patient unable to participate in discharge planning and no caregivers available  GP     Alvira Philips 09/30/2022, 11:07 AM  Kirtan Sada M,PT [place]

## 2022-09-30 NOTE — IPAL (Signed)
  Interdisciplinary Goals of Care Family Meeting   Date carried out: 09/30/2022  Location of the meeting: Conference room  Member's involved: Physician, Bedside Registered Nurse, Family Member or next of kin, and Palliative care team member  Durable Power of Attorney or acting medical decision maker: daughter    Discussion: We discussed goals of care for [name] .  His family is hopeful for a full recovery and wants to continue aggressive care measures. They understand my concern that he may have suffered a significant neurologic injury. Neurology has been consulted and recommends additional testing to help prognosticate.   Code status: Full Code  Disposition: Continue current acute care  Time spent for the meeting: 15 min.    Julian Hy, DO  09/30/2022, 12:29 PM

## 2022-09-30 NOTE — Progress Notes (Signed)
OT Cancellation Note  Patient Details Name: SAMVEL ZINN MRN: [id number] DOB: 10-Sep-1924   Cancelled Treatment:    Reason Eval/Treat Not Completed: Medical issues which prohibited therapy.  Javayah Magaw D Shaima Sardinas 09/30/2022, 9:45 AM 09/30/2022  RP, OTR/L  Acute Rehabilitation Services  Office:  [phone]

## 2022-09-30 NOTE — Progress Notes (Signed)
Patient ID: Daniel Cooke, male   DOB: 1924-11-06, 88 y.o.   MRN: [id number]    Progress Note from the Palliative Medicine Team at [facility]   Patient Name: Daniel Cooke        Date: 09/30/2022 DOB: 1924/06/04  Age: 88 y.o. MRN#: [id number] Attending Physician: Julian Hy, DO Primary Care Physician: Glenis Smoker, MD Admit Date: 09/25/2022   Medical records reviewed   88 year old man with hx of CKD, HLD, DM2 but otherwise fully functional who presented with recurrent colitis found to have C difficile infection.  Hospital course complicated by delirium.  Unfortunately  had what seems to be a aspiration event then had cardiac arrest.  ROSC within 10 mins.   PCCM consulted to assist with post-CPR management.  Intubated and not following commands.   Patient remains intubated, requiring pressor support, right sided pneumothorax chest tube placed overnight.   Family face ongoing  treatment option decisions, advanced directive decisions and anticipatory care needs.   10/13 admit with C. Diff. 10/16 cardiac arrest 10/17 pneumothorax> chest tube placed. EEG with burst suppression.   This NP assessed patient at the bedside as a follow up for palliative medicine needs support.  Coordinated family meeting with Dr Carlis Abbott CCM and  family for medical update. Patient's daughter [name] and Nellie caregiver at the bedside.  Continue conversation regarding current medical situation, specifically to concern for significant neurologic injury and likely long-term poor prognosis.  Gayle verbalizes  understanding,  however, at this point in time family hold on for the hope of improvement,  and will make decisions dependent on outcomes over the next hours and days. Family await input/updates from neurology.   Plan of Care: -Full code    Educated family to consider DNR/DNI status understanding evidenced based poor outcomes in similar hospitalized patient, as the cause of arrest is likely  associated with advanced chronic illness rather than an easily reversible acute cardio-pulmonary event.   Education offered today regarding  the importance of continued conversation with family and their  medical providers regarding overall plan of care and treatment options,  ensuring decisions are within the context of the patients values and GOCs.  Questions and concerns addressed   Discussed with Dr Carlis Abbott and bedside RN  50 minutes   Wadie Lessen NP  Palliative Medicine Team Team Phone # [phone] Pager [phone]

## 2022-09-30 NOTE — Consult Note (Signed)
Neurology Consultation Reason for Consult: Prognosis Referring Physician: Audrie Lia  CC: Encephalopathy  History is obtained from: Chart review  HPI: Daniel Cooke is a 88 y.o. male who suffered an in hospital cardiac arrest lasting for approximately 10 minutes after being admitted for C. difficile colitis.  He apparently had an aspiration event with subsequent PEA arrest.  He has been encephalopathic since that time and there is concern for anoxic injury in this 88 year old gentleman.  Neurology has been consulted for prognosis.   Apparently at baseline he is highly independent and highly active.  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  He is able to accomplish all of his own ADLs, but does have someone else cook for him.  Past Medical History:  Diagnosis Date   Arthritis    "was in left knee before replacement; now have it in my right knee" (11/25/2016)   BPH (benign prostatic hypertrophy)    Chronic diastolic CHF (congestive heart failure) (HCC)    a. 02/2016 Echo: EF 60-65%, no rwma, triv AI, mild MR, mildly to mod dil LA, mod dil RA, PASP .   Dysrhythmia    Essential hypertension    Hard of hearing    wears bilateral hearing aids   Hypercholesterolemia    Osteoarthritis    PAF (paroxysmal atrial fibrillation) (HCC)    a. CHADS2VASC score of 4 --> coumadin.   Pilonidal cyst    PAST HX - NO PROBLEM NOW   Pneumonia    "one time; years ago" (11/25/2016)   Shingles    "long time ago"   Synovitis of knee 02/27/2014   Type II diabetes mellitus (HCC)    oral meds - no insulin     Family History  Problem Relation Age of Onset   Heart disease Mother    Heart disease Brother    Heart attack Brother      Social History:  reports that he has never smoked. He has never used smokeless tobacco. He reports that he does not drink alcohol and does not use drugs.   Exam: Current vital signs: BP (!) 142/71   Pulse 97   Temp 99.5 F  (37.5 C)   Resp (!) 29   Ht 5\' 10"  (1.778 m)   Wt 79 kg   SpO2 100%   BMI 24.99 kg/m  Vital signs in last 24 hours: Temp:  [98.2 F (36.8 C)-99.7 F (37.6 C)] 99.5 F (37.5 C) (10/18 1400) Pulse Rate:  [63-97] 97 (10/18 1400) Resp:  [12-35] 29 (10/18 1400) BP: (84-142)/(46-71) 142/71 (10/18 1400) SpO2:  [99 %-100 %] 100 % (10/18 1400) Arterial Line BP: (98-181)/(37-60) 181/60 (10/18 1000) FiO2 (%):  [40 %] 40 % (10/18 1211)   Physical Exam  He is intubated in bed.  Neuro: Mental Status: Eyes are open, but the patient does not engage or follow commands. cranial Nerves: II: He does not blink to threat.  Right pupil is slightly eccentric, appears postsurgical, but both are reactive.   III,IV, VI: Eyes are midline V: VII: Corneals are intact Motor: He has flexion withdrawal in the left lower extremity, minimal withdrawal in the three other extremities Sensory: As above  Cerebellar: Does not perform   I have reviewed labs in epic and the results pertinent to this consultation are: Creatinine 2.31, BUN 74  I have reviewed the images obtained: CT-no acute findings  Impression: 88 year old male with persistent severe encephalopathy 2 days status post  cardiac arrest.  My suspicion is that he has had some degree of anoxic insult, but with preserved brainstem reflexes and withdrawal, do think ancillary information will be helpful.  His initial EEG did demonstrate burst suppression, but in the setting of propofol do not know we can use this as a definitive test.  I would favor repeating EEG and MRI.  Recommendations: 1) MRI brain 2) EEG 3) neurology will follow  Roland Rack, MD [facility] [phone]  If 7pm- 7am, please page neurology on call as listed in [place].

## 2022-09-30 NOTE — Progress Notes (Signed)
NAME:  Daniel Cooke, MRN:  [id number], DOB:  12-25-1923, LOS: 5 ADMISSION DATE:  09/25/2022, CONSULTATION DATE:  09/28/22 REFERRING MD:  Code team, CHIEF COMPLAINT:  arrest   History of Present Illness:  88 year old man with hx of CKD, HLD, DM2 but otherwise fully functional who presented with recurrent colitis found to have C difficile infection.  Hospital course complicated by delirium.  Unfortunately today had what seems to be a aspiration event then had cardiac arrest.  ROSC within 10 mins.  Overbreathing vent.  PCCM consulted to assist with post-CPR management.  Intubated and not following commands.  Pertinent  Medical History   Past Medical History:  Diagnosis Date   Arthritis    "was in left knee before replacement; now have it in my right knee" (11/25/2016)   BPH (benign prostatic hypertrophy)    Chronic diastolic CHF (congestive heart failure) ([place])    a. 02/2016 Echo: EF 60-65%, no rwma, triv AI, mild MR, mildly to mod dil LA, mod dil RA, PASP 51mmHg.   Dysrhythmia    Essential hypertension    Hard of hearing    wears bilateral hearing aids   Hypercholesterolemia    Osteoarthritis    PAF (paroxysmal atrial fibrillation) ([place])    a. CHADS2VASC score of 4 --> coumadin.   Pilonidal cyst    PAST HX - NO PROBLEM NOW   Pneumonia    "one time; years ago" (11/25/2016)   Shingles    "long time ago"   Synovitis of knee 02/27/2014   Type II diabetes mellitus ([place])    oral meds - no insulin    Significant Hospital Events: Including procedures, antibiotic start and stop dates in addition to other pertinent events   10/13 admit with C. Diff. 10/16 cardiac arrest 10/17 pneumothorax> chest tube placed. EEG with burst suppression.   Interim History / Subjective:  Off sedation, not following commands.   Objective   Blood pressure 136/70, pulse 84, temperature 99.3 F (37.4 C), resp. rate 18, height 5\' 10"  (1.778 m), weight 79 kg, SpO2 100 %.    Vent Mode: PSV;CPAP FiO2  (%):  [40 %] 40 % Set Rate:  [15 bmp] 15 bmp Vt Set:  [510 mL] 510 mL PEEP:  [5 cmH20-7 cmH20] 5 cmH20 Pressure Support:  [5 cmH20-8 cmH20] 5 cmH20 Plateau Pressure:  [17 cmH20-18 cmH20] 18 cmH20   Intake/Output Summary (Last 24 hours) at 09/30/2022 0934 Last data filed at 09/30/2022 0800 Gross per 24 hour  Intake 2078.82 ml  Output 525 ml  Net 1553.82 ml    Filed Weights   09/28/22 1600  Weight: 79 kg    Examination: Gen:     Elderly man lying in bed intubated, off sedation.  Eyes open. HEENT: [place]/AT, eyes anicteric.  Endotracheal tube in place. Lungs:   Chest tube to suction with blood-tinged fluid, no air leak.  Mild thick tan sputum.  Rhonchi bilaterally. CV:       S1-S2, regular rate and rhythm Abd:   Soft, nontender, nondistended Ext:    Normal peripheral edema.  Cool left hand distal to the arterial line. Skin:      Dry, no rashes.  Subcutaneous emphysema right side of his chest. Neuro: Eyes open, not tracking with his eyes or following commands.  Withdraws from pain on right side, not withdrawing on left side.  Blinking regularly, unable to assess if he is blinking to threat.  No pharyngeal gag, intact tracheal cough.  Grimaces to  oral suctioning.  EEG: generalized burst suppression  Bicarb 20 BUN 74 Cr 2.31 WBC 10.2 H/H 9.8 31.2 Platelets 195 CXR personally reviewed>[place] emphysema on R, small residual pneumothorax vs effusion in inferior R hemothorax. Persistent LLL opacity and silhouetting diaphragm.  Resolved Hospital Problem list   N/A  Assessment & Plan:  PEA IHCA with timing and imaging most c/w aspiration event in setting of critical illness delirium.  Time of code ~10 mins.  Not following commands and EEG not reassuring with burst suppression Echo reviewed with preserved ejection fraction, pulmonary hypertension noted which is unchanged compared to 2017 -Continue supportive care - Continue telemetry -Wean vasopressors as able  Acute respiratory failure  secondary to cardiac arrest Right sided pneumothorax Left lower lobe pneumonia -Continue low tidal volume ventilation -VAP prevention protocol - PAD protocol for sedation-currently off all sedatives to facilitate best neurologic exam. - Daily SAT and SBT as appropriate.  Mental status currently precludes extubation. - Zosyn -Chest tube to suction, AM CXR  C. difficile colitis -Would prefer to switch to fidaxomicin per guideline recommendations  Acute metabolic encephalopathy, concern for anoxic injury --Neurology consultation - Continue holding sedation  Diabetes mellitus -Sliding scale insulin - Goal blood glucose 140-180  Macrocytic anemia -empiric B12 and folic acid supplements -monitor H/H; transfuse for Hb <7 or hemodynamically significant bleeding -ok to con't low dose apixaban, monitor closely  AKI on CKD NAGMA likely 2/2 diarrhea, AKI -strict IO -renally dose meds, avoid nephrotoxic meds -bicarb enterally -Maintain euvolemia  Elevated LFTs, shock liver-improving -No additional monitoring required   Caregiver at bedside. Family have not come to bedside.  Best Practice (right click and "Reselect all SmartList Selections" daily)   Diet/type: tubefeeds DVT prophylaxis: DOAC GI prophylaxis: PPI Lines: Central line Foley:  Yes, and it is still needed Code Status:  full code Last date of multidisciplinary goals of care discussion [pending ]  Critical care time:   This patient is critically ill with multiple organ system failure which requires frequent high complexity decision making, assessment, support, evaluation, and titration of therapies. This was completed through the application of advanced monitoring technologies and extensive interpretation of multiple databases. During this encounter critical care time was devoted to patient care services described in this note for 40 minutes.  Steffanie Dunn, DO 09/30/22 10:10 AM [facility]

## 2022-10-01 ENCOUNTER — Encounter (INDEPENDENT_AMBULATORY_CARE_PROVIDER_SITE_OTHER): Payer: Medicare Other | Admitting: Ophthalmology

## 2022-10-01 ENCOUNTER — Inpatient Hospital Stay (HOSPITAL_COMMUNITY): Payer: Medicare Other

## 2022-10-01 DIAGNOSIS — A0472 Enterocolitis due to Clostridium difficile, not specified as recurrent: Secondary | ICD-10-CM

## 2022-10-01 DIAGNOSIS — G9340 Encephalopathy, unspecified: Secondary | ICD-10-CM

## 2022-10-01 DIAGNOSIS — G931 Anoxic brain damage, not elsewhere classified: Secondary | ICD-10-CM | POA: Diagnosis not present

## 2022-10-01 DIAGNOSIS — L8990 Pressure ulcer of unspecified site, unspecified stage: Secondary | ICD-10-CM | POA: Insufficient documentation

## 2022-10-01 DIAGNOSIS — J690 Pneumonitis due to inhalation of food and vomit: Secondary | ICD-10-CM

## 2022-10-01 DIAGNOSIS — G9341 Metabolic encephalopathy: Secondary | ICD-10-CM | POA: Diagnosis not present

## 2022-10-01 DIAGNOSIS — Z9911 Dependence on respirator [ventilator] status: Secondary | ICD-10-CM | POA: Diagnosis not present

## 2022-10-01 DIAGNOSIS — R4182 Altered mental status, unspecified: Secondary | ICD-10-CM | POA: Diagnosis not present

## 2022-10-01 LAB — GLUCOSE, CAPILLARY
Glucose-Capillary: 170 mg/dL — ABNORMAL HIGH (ref 70–99)
Glucose-Capillary: 216 mg/dL — ABNORMAL HIGH (ref 70–99)
Glucose-Capillary: 229 mg/dL — ABNORMAL HIGH (ref 70–99)
Glucose-Capillary: 249 mg/dL — ABNORMAL HIGH (ref 70–99)
Glucose-Capillary: 268 mg/dL — ABNORMAL HIGH (ref 70–99)
Glucose-Capillary: 281 mg/dL — ABNORMAL HIGH (ref 70–99)

## 2022-10-01 LAB — CBC
HCT: 33.1 % — ABNORMAL LOW (ref 39.0–52.0)
Hemoglobin: 10.4 g/dL — ABNORMAL LOW (ref 13.0–17.0)
MCH: 32.2 pg (ref 26.0–34.0)
MCHC: 31.4 g/dL (ref 30.0–36.0)
MCV: 102.5 fL — ABNORMAL HIGH (ref 80.0–100.0)
Platelets: 217 10*3/uL (ref 150–400)
RBC: 3.23 MIL/uL — ABNORMAL LOW (ref 4.22–5.81)
RDW: 14.5 % (ref 11.5–15.5)
WBC: 11.9 10*3/uL — ABNORMAL HIGH (ref 4.0–10.5)
nRBC: 0.3 % — ABNORMAL HIGH (ref 0.0–0.2)

## 2022-10-01 LAB — COMPREHENSIVE METABOLIC PANEL
ALT: 36 U/L (ref 0–44)
AST: 26 U/L (ref 15–41)
Albumin: 2.3 g/dL — ABNORMAL LOW (ref 3.5–5.0)
Alkaline Phosphatase: 85 U/L (ref 38–126)
Anion gap: 10 (ref 5–15)
BUN: 74 mg/dL — ABNORMAL HIGH (ref 8–23)
CO2: 22 mmol/L (ref 22–32)
Calcium: 9.3 mg/dL (ref 8.9–10.3)
Chloride: 115 mmol/L — ABNORMAL HIGH (ref 98–111)
Creatinine, Ser: 2.24 mg/dL — ABNORMAL HIGH (ref 0.61–1.24)
GFR, Estimated: 26 mL/min — ABNORMAL LOW (ref >60)
Glucose, Bld: 221 mg/dL — ABNORMAL HIGH (ref 70–99)
Potassium: 3.8 mmol/L (ref 3.5–5.1)
Sodium: 147 mmol/L — ABNORMAL HIGH (ref 135–145)
Total Bilirubin: 1 mg/dL (ref 0.3–1.2)
Total Protein: 4.7 g/dL — ABNORMAL LOW (ref 6.5–8.1)

## 2022-10-01 LAB — PHOSPHORUS: Phosphorus: 2.7 mg/dL (ref 2.5–4.6)

## 2022-10-01 LAB — MAGNESIUM: Magnesium: 2.4 mg/dL (ref 1.7–2.4)

## 2022-10-01 MED ORDER — FOLIC ACID 1 MG PO TABS
1.0000 mg | ORAL_TABLET | Freq: Every day | ORAL | Status: DC
  Administered 2022-10-01 – 2022-10-13 (×12): 1 mg
  Filled 2022-10-01 (×13): qty 1

## 2022-10-01 MED ORDER — DOCUSATE SODIUM 50 MG/5ML PO LIQD: 100.0000 mg | Freq: Two times a day (BID) | ORAL | Status: DC

## 2022-10-01 MED ORDER — POLYETHYLENE GLYCOL 3350 17 G PO PACK: 17.0000 g | PACK | Freq: Every day | ORAL | Status: DC

## 2022-10-01 MED ORDER — FREE WATER
200.0000 mL | Status: DC
  Administered 2022-10-01 – 2022-10-02 (×7): 200 mL

## 2022-10-01 MED ORDER — INSULIN GLARGINE-YFGN 100 UNIT/ML ~~LOC~~ SOLN
8.0000 [IU] | Freq: Every day | SUBCUTANEOUS | Status: DC
  Administered 2022-10-01: 8 [IU] via SUBCUTANEOUS
  Filled 2022-10-01 (×2): qty 0.08

## 2022-10-01 MED ORDER — FENTANYL CITRATE PF 50 MCG/ML IJ SOSY
25.0000 ug | PREFILLED_SYRINGE | INTRAMUSCULAR | Status: DC | PRN
  Administered 2022-10-04: 25 ug via INTRAVENOUS
  Filled 2022-10-01: qty 1

## 2022-10-01 MED ORDER — FENTANYL CITRATE PF 50 MCG/ML IJ SOSY: 25.0000 ug | PREFILLED_SYRINGE | INTRAMUSCULAR | Status: DC | PRN

## 2022-10-01 MED ORDER — ACETAMINOPHEN 325 MG PO TABS
650.0000 mg | ORAL_TABLET | Freq: Four times a day (QID) | ORAL | Status: AC
  Administered 2022-10-01 – 2022-10-02 (×4): 650 mg
  Filled 2022-10-01 (×4): qty 2

## 2022-10-01 MED ORDER — VITAMIN B-12 1000 MCG PO TABS
1000.0000 ug | ORAL_TABLET | Freq: Every day | ORAL | Status: DC
  Administered 2022-10-01 – 2022-10-13 (×12): 1000 ug
  Filled 2022-10-01 (×13): qty 1

## 2022-10-01 NOTE — Progress Notes (Signed)
Daughter [name] updated via phone. We reviewed his MRI results that do not ensure that he has not suffered a brain injury, but do not show a definitive injury. She indicated that he has had very severe confusion from pain meds after a knee surgery about 20 years ago and is agreeable to avoiding sedation as much as we possibly can to get our best neurologic exam. All questions answered.  Julian Hy, DO 10/01/22 6:02 PM [facility]

## 2022-10-01 NOTE — Inpatient Diabetes Management (Signed)
Inpatient Diabetes Program Recommendations  AACE/ADA: New Consensus Statement on Inpatient Glycemic Control (2015)  Target Ranges:  Prepandial:   less than 140 mg/dL      Peak postprandial:   less than 180 mg/dL (1-2 hours)      Critically ill patients:  140 - 180 mg/dL   Lab Results  Component Value Date   GLUCAP 249 (H) 10/01/2022   HGBA1C 8.3 (H) 09/12/2022    Review of Glycemic Control  Latest Reference Range & Units 09/30/22 23:28 10/01/22 03:38 10/01/22 08:54 10/01/22 11:52  Glucose-Capillary 70 - 99 mg/dL 200 (H) 170 (H) 229 (H) 249 (H)  (H): Data is abnormally high Diabetes history: Type 2 DM Outpatient Diabetes medications: Humalog 2 units with supper, Lantus 12-16 units QHS Current orders for Inpatient glycemic control: Semglee 8 units QD, Novolog 0-9 units Q4H  Inpatient Diabetes Program Recommendations:    If blood glucose continues to remain above 200 mg/dL consider adding Novolog 3 units Q4H for tube feed coverage (to be stopped or held in the event tube feeds are stopped).   Thanks, Bronson Curb, MSN, RNC-OB Diabetes Coordinator [phone] (8a-5p)

## 2022-10-01 NOTE — Progress Notes (Signed)
Subjective: Slightly more awake today  Exam: Vitals:   10/01/22 0900 10/01/22 1000  BP: (!) 143/63 (!) 144/69  Pulse: 80 77  Resp: 20 (!) 24  Temp: 99.1 F (37.3 C) 99.3 F (37.4 C)  SpO2: 100% 100%   Gen: In bed, NAD Resp: non-labored breathing, no acute distress Abd: soft, nt  Neuro: MS: His eyes are open, when I speak he does attempt to move his eyes to the left, does not follow commands CN: Right pupil is postsurgical, left pupil is reactive and round, blinks to eyelid stimulation bilaterally Motor: Flexion of the right arm, flexion versus withdrawal of bilateral lower extremities, withdrawal of the left upper extremity Sensory: As above   Pertinent Labs: Send in 147  Impression: 88 year old male with persistent encephalopathy after cardiac arrest.  He does display some signs of improvement, but I continue to have significant concerns in a 89 year old especially with an asymmetric exam.  My suspicion is that he has suffered some degree of injury, but whether he will continue to improve or not is unclear to me at this time.  Would continue to consider his prognosis guarded given his age, but it would not be unreasonable to observe him from a neurological perspective for a while longer to see his trend.  Recommendations: 1) continue to follow   Roland Rack, MD [facility] [phone]  If 7pm- 7am, please page neurology on call as listed in [place].

## 2022-10-01 NOTE — Progress Notes (Signed)
NAME:  Daniel Cooke, MRN:  [id number], DOB:  1924/09/24, LOS: 6 ADMISSION DATE:  09/25/2022, CONSULTATION DATE:  09/28/22 REFERRING MD:  Code team, CHIEF COMPLAINT:  arrest   History of Present Illness:  88 year old man with hx of CKD, HLD, DM2 but otherwise fully functional who presented with recurrent colitis found to have C difficile infection.  Hospital course complicated by delirium.  Unfortunately today had what seems to be a aspiration event then had cardiac arrest.  ROSC within 10 mins.  Overbreathing vent.  PCCM consulted to assist with post-CPR management.  Intubated and not following commands.  Pertinent  Medical History   Past Medical History:  Diagnosis Date   Arthritis    "was in left knee before replacement; now have it in my right knee" (11/25/2016)   BPH (benign prostatic hypertrophy)    Chronic diastolic CHF (congestive heart failure) (HCC)    a. 02/2016 Echo: EF 60-65%, no rwma, triv AI, mild MR, mildly to mod dil LA, mod dil RA, PASP .   Dysrhythmia    Essential hypertension    Hard of hearing    wears bilateral hearing aids   Hypercholesterolemia    Osteoarthritis    PAF (paroxysmal atrial fibrillation) (HCC)    a. CHADS2VASC score of 4 --> coumadin.   Pilonidal cyst    PAST HX - NO PROBLEM NOW   Pneumonia    "one time; years ago" (11/25/2016)   Shingles    "long time ago"   Synovitis of knee 02/27/2014   Type II diabetes mellitus (HCC)    oral meds - no insulin    Significant Hospital Events: Including procedures, antibiotic start and stop dates in addition to other pertinent events   10/13 admit with C. Diff. 10/16 cardiac arrest 10/17 pneumothorax> chest tube placed. EEG with burst suppression.   Interim History / Subjective:  Overnight no acute events.  Objective   Blood pressure (!) 144/73, pulse 80, temperature 98.4 F (36.9 C), temperature source Oral, resp. rate (!) 22, height 5\' 10"  (1.778 m), weight 79 kg, SpO2 100 %.    Vent  Mode: PRVC FiO2 (%):  [40 %] 40 % Set Rate:  [15 bmp] 15 bmp Vt Set:  [510 mL] 510 mL PEEP:  [5 cmH20] 5 cmH20 Pressure Support:  [5 cmH20] 5 cmH20 Plateau Pressure:  [8 cmH20-15 cmH20] 14 cmH20   Intake/Output Summary (Last 24 hours) at 10/01/2022 0701 Last data filed at 10/01/2022 0600 Gross per 24 hour  Intake 1517.83 ml  Output 1105 ml  Net 412.83 ml    Filed Weights   09/28/22 1600  Weight: 79 kg    Examination: Gen:   Elderly man lying in bed intubated, off sedation HEENT: /AT, eyes anicteric, endotracheal tube in place Resp:   Right-sided chest tube in place with blood-tinged drainage.  No air leak.  Ongoing thick tan secretions from endotracheal tube.  No rhonchi today. CV:       S1-S2, regular rate and rhythm Abd:   Soft, nontender, nondistended Ext: Disproportionate left greater than right upper extremity edema with mild erythema in his left forearm.  Skin:      Weeping from his right arm.  No diffuse rashes.  Subcutaneous emphysema across his right chest.  Bruising along left flank laterally Neuro: Eyes slightly open, opens them more during provocative exam maneuvers.  No response to verbal stimulation.  Intact pupillary and corneal reflexes.  Winces to upper extremity pain but not withdrawing,  withdraws bilateral lower extremities.  Winces during chest exam with pressure on his ribs.  EEG: pending MRI brain: no infarcts Na+ 147 BUN 74 Cr 2.24 WBC 11.9 H/H 10.4/33.1 Platelets 217 CXR personally reviewed>L pneumonia, R pneumothorax resolved, chest tube remains in place on the R.  ETT 6 cm above carina.  Blood cultures> NGTD  Resolved Hospital Problem list   N/A  Assessment & Plan:  PEA IHCA with timing and imaging most c/w aspiration event in setting of critical illness delirium.  Time of code ~10 mins.  Not following commands and EEG not reassuring with burst suppression Echo reviewed with preserved ejection fraction, pulmonary hypertension noted which is  unchanged compared to 2017 -Continue supportive care.  Family is hopeful for recovery. - Continue telemetry monitoring - off pressors  Acute respiratory failure secondary to cardiac arrest Right sided pneumothorax, traumatic from CPR Left lower lobe pneumonia -LTVV -VAP prevention protocol - PAD protocol for sedation-currently off to allow for best neuro exam - Daily SAT and SBT as appropriate.  Mental status currently precludes extubation. - Continue Zosyn.  Unfortunately respiratory cultures never been collected.  Plan for 7-day course. -Continue chest tube to suction  C. difficile colitis - Complete treatment course with fidaxomicin per guideline recommendations  Acute metabolic encephalopathy, concern for anoxic injury.  MRI unrevealing. -- Appreciate neurology's management. - EEG read pending - Continue holding sedation and supportive care.  Diabetes mellitus - Add Semglee 8 units daily - Sliding scale insulin as needed - Goal blood glucose 140-180  Macrocytic anemia -empiric G83 and folic acid supplements -Transfuse for hemoglobin less than 7 or hemodynamically significant bleeding - Continue low-dose apixaban  AKI on CKD NAGMA likely 2/2 diarrhea, AKI -strict IO -renally dose meds, avoid nephrotoxic meds -bicarb enterally -Maintain euvolemia  Elevated LFTs, shock liver-improving -No additional monitoring required  Family has been hopeful for full recovery.  Appreciate palliative care and chaplain's assistance.  We will update on MRI results later today.  Best Practice (right click and "Reselect all SmartList Selections" daily)   Diet/type: tubefeeds DVT prophylaxis: DOAC GI prophylaxis: PPI Lines: Central line Foley:  Yes, and it is still needed Code Status:  full code Last date of multidisciplinary goals of care discussion [ 10/18 ]  Critical care time:   This patient is critically ill with multiple organ system failure which requires frequent high  complexity decision making, assessment, support, evaluation, and titration of therapies. This was completed through the application of advanced monitoring technologies and extensive interpretation of multiple databases. During this encounter critical care time was devoted to patient care services described in this note for 35 minutes.  Julian Hy, DO 10/01/22 7:48 AM [facility]

## 2022-10-01 NOTE — Progress Notes (Signed)
This chaplain is present with the Pt. and Pt. caregiver-Nellie after checking in with the Pt. RN-Debbie.  The Pt. is resting comfortably as the chaplain visits with Nellie. Nellie shares the Pt. is intermittently following her requests to blink his eyes twice and make a fist. The chaplain listens reflectively as Nellie participates in story telling. The chaplain understands, in conversations with others, the Pt. listens and interjects as needed through thoughtful reflection and impact on the speaker.  Nellie describes the caregiver relationship with the Pt. as built on trust.  Nellie describes herself as more religious than the patient. Nellie accepts the chaplain's prayer and places the prayer shawl on the Pt. feet.  This chaplain is available for F/U spiritual care as needed.  Chaplain Sallyanne Kuster [phone]

## 2022-10-01 NOTE — Progress Notes (Signed)
SLP Cancellation Note  Patient Details Name: Daniel Cooke MRN: [id number] DOB: 07-11-1924   Cancelled treatment:       Reason Eval/Treat Not Completed: Medical issues which prohibited therapy (remains on vent).     Osie Bond., M.A. [place] Office [phone]  Secure chat preferred  10/01/2022, 7:52 AM

## 2022-10-01 NOTE — Plan of Care (Signed)
  Problem: Education: Goal: Ability to describe self-care measures that may prevent or decrease complications (Diabetes Survival Skills Education) will improve Outcome: Not Progressing Goal: Individualized Educational Video(s) Outcome: Not Progressing   Problem: Coping: Goal: Ability to adjust to condition or change in health will improve Outcome: Not Progressing   Problem: Fluid Volume: Goal: Ability to maintain a balanced intake and output will improve Outcome: Not Progressing   Problem: Health Behavior/Discharge Planning: Goal: Ability to identify and utilize available resources and services will improve Outcome: Not Progressing Goal: Ability to manage health-related needs will improve Outcome: Not Progressing   Problem: Metabolic: Goal: Ability to maintain appropriate glucose levels will improve Outcome: Not Progressing   Problem: Nutritional: Goal: Maintenance of adequate nutrition will improve Outcome: Not Progressing Goal: Progress toward achieving an optimal weight will improve Outcome: Not Progressing   Problem: Skin Integrity: Goal: Risk for impaired skin integrity will decrease Outcome: Not Progressing   Problem: Tissue Perfusion: Goal: Adequacy of tissue perfusion will improve Outcome: Not Progressing   Problem: Education: Goal: Knowledge of General Education information will improve Description: Including pain rating scale, medication(s)/side effects and non-pharmacologic comfort measures Outcome: Not Progressing   Problem: Health Behavior/Discharge Planning: Goal: Ability to manage health-related needs will improve Outcome: Not Progressing   Problem: Clinical Measurements: Goal: Ability to maintain clinical measurements within normal limits will improve Outcome: Not Progressing Goal: Will remain free from infection Outcome: Not Progressing Goal: Diagnostic test results will improve Outcome: Not Progressing Goal: Respiratory complications will  improve Outcome: Not Progressing Goal: Cardiovascular complication will be avoided Outcome: Not Progressing   Problem: Activity: Goal: Risk for activity intolerance will decrease Outcome: Not Progressing   Problem: Nutrition: Goal: Adequate nutrition will be maintained Outcome: Not Progressing   Problem: Coping: Goal: Level of anxiety will decrease Outcome: Not Progressing   Problem: Elimination: Goal: Will not experience complications related to bowel motility Outcome: Not Progressing Goal: Will not experience complications related to urinary retention Outcome: Not Progressing   Problem: Pain Managment: Goal: General experience of comfort will improve Outcome: Not Progressing   Problem: Safety: Goal: Ability to remain free from injury will improve Outcome: Not Progressing   Problem: Skin Integrity: Goal: Risk for impaired skin integrity will decrease Outcome: Not Progressing   Problem: Education: Goal: Ability to manage disease process will improve Outcome: Not Progressing   Problem: Cardiac: Goal: Ability to achieve and maintain adequate cardiopulmonary perfusion will improve Outcome: Not Progressing   Problem: Neurologic: Goal: Promote progressive neurologic recovery Outcome: Not Progressing   Problem: Skin Integrity: Goal: Risk for impaired skin integrity will be minimized. Outcome: Not Progressing   Problem: Safety: Goal: Non-violent Restraint(s) Outcome: Completed/Met

## 2022-10-01 NOTE — Procedures (Signed)
Patient Name: Daniel Cooke  MRN: [id number]  Epilepsy Attending: Lora Havens  Referring Physician/Provider: Greta Doom, MD  Date: 09/30/2022 Duration: 22.56 mins  Patient history: 88 year old male with persistent severe encephalopathy 2 days status post cardiac arrest. EEG to evaluate for seizure.  Level of alertness:  lethargic   AEDs during EEG study: None  Technical aspects: This EEG study was done with scalp electrodes positioned according to the 10-20 International system of electrode placement. Electrical activity was reviewed with band pass filter of 1-70Hz , sensitivity of 7 uV/mm, display speed of 51mm/sec with a 60Hz  notched filter applied as appropriate. EEG data were recorded continuously and digitally stored.  Video monitoring was available and reviewed as appropriate.  Description: EEG showed continuous generalized 3 to 6 Hz theta-delta slowing. Hyperventilation and photic stimulation were not performed.     ABNORMALITY - Continuous slow, generalized  IMPRESSION: This study is suggestive of moderate to severe diffuse encephalopathy, nonspecific etiology. No seizures or epileptiform discharges were seen throughout the recording.  Lelend Heinecke Barbra Sarks

## 2022-10-02 DIAGNOSIS — I469 Cardiac arrest, cause unspecified: Secondary | ICD-10-CM

## 2022-10-02 DIAGNOSIS — A0472 Enterocolitis due to Clostridium difficile, not specified as recurrent: Secondary | ICD-10-CM | POA: Diagnosis not present

## 2022-10-02 DIAGNOSIS — N179 Acute kidney failure, unspecified: Secondary | ICD-10-CM | POA: Diagnosis not present

## 2022-10-02 DIAGNOSIS — G9341 Metabolic encephalopathy: Secondary | ICD-10-CM | POA: Diagnosis not present

## 2022-10-02 LAB — COMPREHENSIVE METABOLIC PANEL
ALT: 28 U/L (ref 0–44)
AST: 20 U/L (ref 15–41)
Albumin: 2.1 g/dL — ABNORMAL LOW (ref 3.5–5.0)
Alkaline Phosphatase: 77 U/L (ref 38–126)
Anion gap: 8 (ref 5–15)
BUN: 72 mg/dL — ABNORMAL HIGH (ref 8–23)
CO2: 25 mmol/L (ref 22–32)
Calcium: 9.2 mg/dL (ref 8.9–10.3)
Chloride: 115 mmol/L — ABNORMAL HIGH (ref 98–111)
Creatinine, Ser: 2.07 mg/dL — ABNORMAL HIGH (ref 0.61–1.24)
GFR, Estimated: 29 mL/min — ABNORMAL LOW (ref >60)
Glucose, Bld: 222 mg/dL — ABNORMAL HIGH (ref 70–99)
Potassium: 3.6 mmol/L (ref 3.5–5.1)
Sodium: 148 mmol/L — ABNORMAL HIGH (ref 135–145)
Total Bilirubin: 1.1 mg/dL (ref 0.3–1.2)
Total Protein: 4.5 g/dL — ABNORMAL LOW (ref 6.5–8.1)

## 2022-10-02 LAB — CBC
HCT: 33.2 % — ABNORMAL LOW (ref 39.0–52.0)
Hemoglobin: 10.2 g/dL — ABNORMAL LOW (ref 13.0–17.0)
MCH: 31.5 pg (ref 26.0–34.0)
MCHC: 30.7 g/dL (ref 30.0–36.0)
MCV: 102.5 fL — ABNORMAL HIGH (ref 80.0–100.0)
Platelets: 221 10*3/uL (ref 150–400)
RBC: 3.24 MIL/uL — ABNORMAL LOW (ref 4.22–5.81)
RDW: 14.7 % (ref 11.5–15.5)
WBC: 10.5 10*3/uL (ref 4.0–10.5)
nRBC: 0 % (ref 0.0–0.2)

## 2022-10-02 LAB — CULTURE, BLOOD (ROUTINE X 2)
Culture: NO GROWTH
Culture: NO GROWTH

## 2022-10-02 LAB — MAGNESIUM: Magnesium: 2.2 mg/dL (ref 1.7–2.4)

## 2022-10-02 LAB — PHOSPHORUS: Phosphorus: 2.5 mg/dL (ref 2.5–4.6)

## 2022-10-02 LAB — GLUCOSE, CAPILLARY
Glucose-Capillary: 166 mg/dL — ABNORMAL HIGH (ref 70–99)
Glucose-Capillary: 202 mg/dL — ABNORMAL HIGH (ref 70–99)
Glucose-Capillary: 227 mg/dL — ABNORMAL HIGH (ref 70–99)
Glucose-Capillary: 250 mg/dL — ABNORMAL HIGH (ref 70–99)
Glucose-Capillary: 283 mg/dL — ABNORMAL HIGH (ref 70–99)
Glucose-Capillary: 280 mg/dL — ABNORMAL HIGH (ref 70–99)

## 2022-10-02 MED ORDER — FUROSEMIDE 10 MG/ML IJ SOLN
40.0000 mg | Freq: Once | INTRAMUSCULAR | Status: AC
  Administered 2022-10-02: 40 mg via INTRAVENOUS
  Filled 2022-10-02: qty 4

## 2022-10-02 MED ORDER — FREE WATER
200.0000 mL | Status: DC
  Administered 2022-10-02 – 2022-10-04 (×16): 200 mL

## 2022-10-02 MED ORDER — INSULIN GLARGINE-YFGN 100 UNIT/ML ~~LOC~~ SOLN
12.0000 [IU] | Freq: Every day | SUBCUTANEOUS | Status: DC
  Administered 2022-10-02: 12 [IU] via SUBCUTANEOUS
  Filled 2022-10-02 (×2): qty 0.12

## 2022-10-02 MED ORDER — BETHANECHOL CHLORIDE 5 MG PO TABS
5.0000 mg | ORAL_TABLET | Freq: Three times a day (TID) | ORAL | Status: DC
  Administered 2022-10-02 – 2022-10-13 (×30): 5 mg
  Filled 2022-10-02 (×34): qty 1

## 2022-10-02 MED ORDER — INSULIN ASPART 100 UNIT/ML IJ SOLN
2.0000 [IU] | INTRAMUSCULAR | Status: DC
  Administered 2022-10-02 – 2022-10-03 (×6): 2 [IU] via SUBCUTANEOUS

## 2022-10-02 NOTE — TOC Progression Note (Signed)
Transition of Care [facility]) - Initial/Assessment Note    Patient Details  Name: Daniel Cooke MRN: [id number] Date of Birth: 1924/08/08  [facility]) CM/SW Contact:    Milinda Antis, LCSWA Phone Number: 10/02/2022, 3:00 PM  Clinical Narrative:                 TOC following patient for d/c planning needs once medically stable.  Patient is currently on the ventilator.   Lind Covert, MSW, LCSW   Patient Goals and CMS Choice        Expected Discharge Plan and Services                                                Prior Living Arrangements/Services                       Activities of Daily Living      Permission Sought/Granted                  Emotional Assessment              Admission diagnosis:  Diverticulitis [K57.92] Elevated troponin [R79.89] AKI (acute kidney injury) ([place]) [N17.9] C. difficile colitis [A04.72] Altered mental status, unspecified altered mental status type [R41.82] Patient Active Problem List   Diagnosis Date Noted   Pressure injury of skin 10/01/2022   C. difficile colitis 09/25/2022   Leukocytosis 09/25/2022   SIRS (systemic inflammatory response syndrome) ([place]) 123XX123   Acute metabolic encephalopathy 123XX123   Renal insufficiency 09/25/2022   Compression fracture of body of thoracic vertebra ([place]) 09/25/2022   GERD (gastroesophageal reflux disease) 09/25/2022   Elevated troponin 09/25/2022   Diverticulitis 09/12/2022   Acute respiratory disease due to COVID-19 virus 05/20/2021   Acute renal failure superimposed on stage 3b chronic kidney disease ([place]) [id number]   Complicated urinary tract infection 11/24/2019   Back pain 11/24/2019   Generalized weakness 11/24/2019   Persistent atrial fibrillation ([place]) 11/15/2019   Acquired thrombophilia ([place]) 11/15/2019   Sensorineural hearing loss (SNHL) of both ears 11/08/2018   Pain in left knee 07/28/2018   Type II diabetes mellitus  ([place])    Shingles    Pneumonia    Pilonidal cyst    AF (paroxysmal atrial fibrillation) (HCC)    Osteoarthritis    Hypercholesterolemia    Hard of hearing    Essential hypertension    Dysrhythmia    Chronic diastolic CHF (congestive heart failure) (HCC)    Arthritis    Compression fracture of fourth lumbar vertebra ([place]) 05/28/2017   Acute on chronic diastolic CHF (congestive heart failure) ([place]) 11/27/2016   Chronic kidney disease, stage 3b ([place]) 11/25/2016   Anemia of chronic disease 11/25/2016   HTN (hypertension) 11/25/2016   HLD (hyperlipidemia) 11/25/2016   Type 2 diabetes mellitus with stage 3 chronic kidney disease, without long-term current use of insulin ([place]) 11/25/2016   Bilateral impacted cerumen 09/15/2016   Encounter for therapeutic drug monitoring 03/20/2016   Synovitis of knee 02/27/2014   OA (osteoarthritis) of knee 02/08/2012   PCP:  Glenis Smoker, MD Pharmacy:   [facility] [place] - [address] [place] [address] Phone: [phone] Fax: [phone]  [id number] [place], [place]  [place] [address] Phone: [phone] Fax: [phone]     Social Determinants of Health (SDOH) Interventions    Readmission Risk Interventions    09/14/2022    1:30 PM  Readmission Risk Prevention Plan  Post Dischage Appt Complete  Medication Screening Complete  Transportation Screening Complete

## 2022-10-02 NOTE — Progress Notes (Signed)
Pharmacy ICU Insulin Management  CBGs above goal 140-180 mg/dL: Yes  Current regimen (include units of SSI in last 24hr):  sSSI q4h (21u/24h) Semglee 8u QAM   Medications affecting CBG levels: tube feeds  Plan: -increased semglee 12u this AM per provider +2u q4h for tube feeding coverage given increase in long acting insulin  Wilson Singer, PharmD Clinical Pharmacist 10/02/2022 10:48 AM

## 2022-10-02 NOTE — Progress Notes (Signed)
Subjective: Again, appears awake.   Exam: Vitals:   10/02/22 0800 10/02/22 0809  BP: (!) 155/74   Pulse: 79 71  Resp: (!) 21 (!) 32  Temp: 99.5 F (37.5 C)   SpO2: 100% 100%   Gen: In bed, NAD Resp: non-labored breathing, no acute distress Abd: soft, nt  Neuro: MS: His eyes are open, when I speak he does attempt to move his eyes to the left, appears to attempt to track but does not smoothly pursue, does not cross midline to the right.  CN: Right pupil is postsurgical, left pupil is reactive and round, blinks to eyelid stimulation bilaterally. He does blink to threat today Motor: Flexion of the right arm, flexion versus withdrawal of bilateral lower extremities, withdrawal of the left upper extremity Sensory: As above   Pertinent Labs: Cr 2.07, trendign down.   Impression: 88 year old male with persistent encephalopathy after cardiac arrest.  He does display some signs of improvement, but I continue to have significant concerns in a 88 year old especially with an asymmetric exam.  My suspicion is that he has suffered some degree of injury, but whether he will continue to improve or not is unclear to me at this time.  Would continue to consider his prognosis guarded given his age, but it would not be unreasonable to observe him from a neurological perspective for a while longer to see his trend.  Recommendations: 1) continue to follow   Roland Rack, MD [facility] [phone]  If 7pm- 7am, please page neurology on call as listed in [place].

## 2022-10-02 NOTE — Progress Notes (Signed)
NAME:  Daniel Cooke, MRN:  [id number], DOB:  Jul 01, 1924, LOS: 7 ADMISSION DATE:  09/25/2022, CONSULTATION DATE:  09/28/22 REFERRING MD:  Code team, CHIEF COMPLAINT:  arrest   History of Present Illness:  88 year old man with hx of CKD, HLD, DM2 but otherwise fully functional who presented with recurrent colitis found to have C difficile infection.  Hospital course complicated by delirium.  Unfortunately today had what seems to be a aspiration event then had cardiac arrest.  ROSC within 10 mins.  Overbreathing vent.  PCCM consulted to assist with post-CPR management.  Intubated and not following commands.  Pertinent  Medical History   Past Medical History:  Diagnosis Date   Arthritis    "was in left knee before replacement; now have it in my right knee" (11/25/2016)   BPH (benign prostatic hypertrophy)    Chronic diastolic CHF (congestive heart failure) ([place])    a. 02/2016 Echo: EF 60-65%, no rwma, triv AI, mild MR, mildly to mod dil LA, mod dil RA, PASP 87mmHg.   Dysrhythmia    Essential hypertension    Hard of hearing    wears bilateral hearing aids   Hypercholesterolemia    Osteoarthritis    PAF (paroxysmal atrial fibrillation) ([place])    a. CHADS2VASC score of 4 --> coumadin.   Pilonidal cyst    PAST HX - NO PROBLEM NOW   Pneumonia    "one time; years ago" (11/25/2016)   Shingles    "long time ago"   Synovitis of knee 02/27/2014   Type II diabetes mellitus ([place])    oral meds - no insulin    Significant Hospital Events: Including procedures, antibiotic start and stop dates in addition to other pertinent events   10/13 admit with C. Diff. 10/16 cardiac arrest 10/17 pneumothorax> chest tube placed. EEG with burst suppression.   Interim History / Subjective:  Overnight no acute events Tolerating spontaneous breathing trial but mental status precludes extubation  Objective   Blood pressure (!) 155/74, pulse 71, temperature 99.5 F (37.5 C), resp. rate (!) 32, height 5'  10" (1.778 m), weight 79 kg, SpO2 100 %.    Vent Mode: PSV;CPAP FiO2 (%):  [40 %] 40 % Set Rate:  [15 bmp] 15 bmp Vt Set:  [510 mL] 510 mL PEEP:  [5 cmH20] 5 cmH20 Pressure Support:  [5 cmH20] 5 cmH20 Plateau Pressure:  [14 cmH20-16 cmH20] 14 cmH20   Intake/Output Summary (Last 24 hours) at 10/02/2022 0910 Last data filed at 10/02/2022 9470 Gross per 24 hour  Intake 3154.67 ml  Output 1635 ml  Net 1519.67 ml   Filed Weights   09/28/22 1600  Weight: 79 kg    Examination: Gen:  Elderly man lying in bed, orally intubated HEENT: [place]/AT, eyes anicteric, endotracheal tube in place Resp: Right-sided chest tube in place with blood-tinged drainage.  No air leak.  No rhonchi or wheezes CV: S1-S2, regular rate and rhythm Abd: Soft, nontender, nondistended Skin:  Weeping from his right arm.  No diffuse rashes.  Subcutaneous emphysema across his right chest.  Bruising along left flank laterally Neuro: Eyes slightly open, opens them more during provocative exam maneuvers.  No response to verbal stimulation.  Intact pupillary and corneal reflexes.  Winces to upper extremity pain but not withdrawing, withdraws bilateral lower extremities.  Winces during chest exam with pressure on his ribs.  EEG: Negative for acute seizures MRI brain: no infarcts Na+ 148 BUN 72 Cr 2.07 WBC 10.5 H/H 10.2/33.2 Platelets  221  Resolved Hospital Problem list   N/A  Assessment & Plan:  S/p PEA cardiac arrest in the setting of hypoxia due to aspiration event Demand cardiac ischemia post CPR ROSC was achieved after 10 minutes EEG remained negative for acute seizures MRI showed no acute intracranial abnormalities Patient is still not following commands  Echocardiogram showed preserved ejection fraction  Continue supportive care  Acute respiratory failure due to aspiration pneumonia Right sided pneumothorax, traumatic from CPR Left lower lobe pneumonia Continue lung protective ventilation VAP bundle in  place He is off sedation Continue IV antibiotics to complete 7-day course of therapy Chest tube with underwater seal suction was stopped this morning Repeat x-ray chest in the morning  C. difficile colitis Continue fidaxomicin per guideline recommendations  Acute metabolic encephalopathy, postarrest There is concern for anoxic brain injury though MRI is negative Patient neurology follow-up EEG has been negative for acute seizures Continue to hold sedation  Diabetes mellitus, poorly controlled with hyperglycemia Fingersticks remain in 200s Increase Semglee to 12 units Sliding scale with CBG Goal blood glucose 140-180  Macrocytic anemia Continue empiric B12 and folic acid supplements Monitor H&H and transfuse for hemoglobin less than 7 or hemodynamically significant bleeding Continue low-dose apixaban  AKI on CKD 3b NAGMA, resolved Serum creatinine continue to improve Monitor intake and output Avoid nephrotoxic agents Acidosis has resolved  Shock liver, postcardiac arrest, improved  Best Practice (right click and "Reselect all SmartList Selections" daily)   Diet/type: tubefeeds DVT prophylaxis: DOAC GI prophylaxis: PPI Lines: Central line Foley:  Yes, and it is still needed Code Status:  full code Last date of multidisciplinary goals of care discussion [ 10/18 ]  Critical care time:    Total critical care time: 41 minutes  Performed by: Cheri Fowler   Critical care time was exclusive of separately billable procedures and treating other patients.   Critical care was necessary to treat or prevent imminent or life-threatening deterioration.   Critical care was time spent personally by me on the following activities: development of treatment plan with patient and/or surrogate as well as nursing, discussions with consultants, evaluation of patient's response to treatment, examination of patient, obtaining history from patient or surrogate, ordering and performing  treatments and interventions, ordering and review of laboratory studies, ordering and review of radiographic studies, pulse oximetry and re-evaluation of patient's condition.   Cheri Fowler, MD [facility] See Amion for pager If no response to pager, please call [phone] until 7pm After 7pm, Please call E-link [phone]

## 2022-10-02 NOTE — Progress Notes (Signed)
SLP Cancellation Note  Patient Details Name: ZOLTAN GENEST MRN: [id number] DOB: 1924/01/14   Cancelled treatment:       Reason Eval/Treat Not Completed: Patient not medically ready. Will sign off at this time and await new orders when/if appropriate.    Jaecob Lowden, Katherene Ponto 10/02/2022, 9:21 AM

## 2022-10-03 ENCOUNTER — Inpatient Hospital Stay (HOSPITAL_COMMUNITY): Payer: Medicare Other

## 2022-10-03 DIAGNOSIS — A0472 Enterocolitis due to Clostridium difficile, not specified as recurrent: Secondary | ICD-10-CM | POA: Diagnosis not present

## 2022-10-03 DIAGNOSIS — J9601 Acute respiratory failure with hypoxia: Secondary | ICD-10-CM | POA: Diagnosis not present

## 2022-10-03 DIAGNOSIS — G9341 Metabolic encephalopathy: Secondary | ICD-10-CM | POA: Diagnosis not present

## 2022-10-03 LAB — COMPREHENSIVE METABOLIC PANEL
ALT: 26 U/L (ref 0–44)
AST: 23 U/L (ref 15–41)
Albumin: 2.2 g/dL — ABNORMAL LOW (ref 3.5–5.0)
Alkaline Phosphatase: 92 U/L (ref 38–126)
Anion gap: 9 (ref 5–15)
BUN: 74 mg/dL — ABNORMAL HIGH (ref 8–23)
CO2: 24 mmol/L (ref 22–32)
Calcium: 9 mg/dL (ref 8.9–10.3)
Chloride: 111 mmol/L (ref 98–111)
Creatinine, Ser: 1.95 mg/dL — ABNORMAL HIGH (ref 0.61–1.24)
GFR, Estimated: 31 mL/min — ABNORMAL LOW (ref >60)
Glucose, Bld: 296 mg/dL — ABNORMAL HIGH (ref 70–99)
Potassium: 3.6 mmol/L (ref 3.5–5.1)
Sodium: 144 mmol/L (ref 135–145)
Total Bilirubin: 1.4 mg/dL — ABNORMAL HIGH (ref 0.3–1.2)
Total Protein: 4.9 g/dL — ABNORMAL LOW (ref 6.5–8.1)

## 2022-10-03 LAB — CBC
HCT: 34.9 % — ABNORMAL LOW (ref 39.0–52.0)
Hemoglobin: 11.4 g/dL — ABNORMAL LOW (ref 13.0–17.0)
MCH: 32.7 pg (ref 26.0–34.0)
MCHC: 32.7 g/dL (ref 30.0–36.0)
MCV: 100 fL (ref 80.0–100.0)
Platelets: 232 10*3/uL (ref 150–400)
RBC: 3.49 MIL/uL — ABNORMAL LOW (ref 4.22–5.81)
RDW: 15.1 % (ref 11.5–15.5)
WBC: 14.7 10*3/uL — ABNORMAL HIGH (ref 4.0–10.5)
nRBC: 0 % (ref 0.0–0.2)

## 2022-10-03 LAB — GLUCOSE, CAPILLARY
Glucose-Capillary: 134 mg/dL — ABNORMAL HIGH (ref 70–99)
Glucose-Capillary: 154 mg/dL — ABNORMAL HIGH (ref 70–99)
Glucose-Capillary: 185 mg/dL — ABNORMAL HIGH (ref 70–99)
Glucose-Capillary: 251 mg/dL — ABNORMAL HIGH (ref 70–99)
Glucose-Capillary: 270 mg/dL — ABNORMAL HIGH (ref 70–99)
Glucose-Capillary: 272 mg/dL — ABNORMAL HIGH (ref 70–99)

## 2022-10-03 LAB — MAGNESIUM: Magnesium: 2.1 mg/dL (ref 1.7–2.4)

## 2022-10-03 LAB — PHOSPHORUS: Phosphorus: 2.8 mg/dL (ref 2.5–4.6)

## 2022-10-03 MED ORDER — INSULIN GLARGINE-YFGN 100 UNIT/ML ~~LOC~~ SOLN
16.0000 [IU] | Freq: Every day | SUBCUTANEOUS | Status: DC
  Administered 2022-10-03 – 2022-10-04 (×2): 16 [IU] via SUBCUTANEOUS
  Filled 2022-10-03 (×3): qty 0.16

## 2022-10-03 MED ORDER — SODIUM CHLORIDE 0.9% FLUSH
10.0000 mL | Freq: Three times a day (TID) | INTRAVENOUS | Status: DC
  Administered 2022-10-03 – 2022-10-12 (×19): 10 mL via INTRAPLEURAL

## 2022-10-03 MED ORDER — FAMOTIDINE 40 MG/5ML PO SUSR
20.0000 mg | Freq: Every day | ORAL | Status: DC
  Administered 2022-10-03 – 2022-10-04 (×2): 20 mg
  Filled 2022-10-03 (×3): qty 2.5

## 2022-10-03 MED ORDER — INSULIN ASPART 100 UNIT/ML IJ SOLN
4.0000 [IU] | INTRAMUSCULAR | Status: DC
  Administered 2022-10-03 – 2022-10-04 (×6): 4 [IU] via SUBCUTANEOUS

## 2022-10-03 NOTE — Progress Notes (Signed)
NAME:  Daniel Cooke, MRN:  [id number], DOB:  1924/03/07, LOS: 8 ADMISSION DATE:  09/25/2022, CONSULTATION DATE:  09/28/22 REFERRING MD:  Code team, CHIEF COMPLAINT:  arrest   History of Present Illness:  87 year old man with hx of CKD, HLD, DM2 but otherwise fully functional who presented with recurrent colitis found to have C difficile infection.  Hospital course complicated by delirium.  Unfortunately he had what seems to be a aspiration event then had cardiac arrest.  ROSC within 10 mins.  Overbreathing vent.  PCCM consulted to assist with post-CPR management.  Intubated and not following commands.  Pertinent  Medical History   Past Medical History:  Diagnosis Date   Arthritis    "was in left knee before replacement; now have it in my right knee" (11/25/2016)   BPH (benign prostatic hypertrophy)    Chronic diastolic CHF (congestive heart failure) ([place])    a. 02/2016 Echo: EF 60-65%, no rwma, triv AI, mild MR, mildly to mod dil LA, mod dil RA, PASP 89mmHg.   Dysrhythmia    Essential hypertension    Hard of hearing    wears bilateral hearing aids   Hypercholesterolemia    Osteoarthritis    PAF (paroxysmal atrial fibrillation) ([place])    a. CHADS2VASC score of 4 --> coumadin.   Pilonidal cyst    PAST HX - NO PROBLEM NOW   Pneumonia    "one time; years ago" (11/25/2016)   Shingles    "long time ago"   Synovitis of knee 02/27/2014   Type II diabetes mellitus ([place])    oral meds - no insulin    Significant Hospital Events: Including procedures, antibiotic start and stop dates in addition to other pertinent events   10/13 admit with C. Diff. 10/16 cardiac arrest 10/17 pneumothorax> chest tube placed. EEG with burst suppression.  10/20 SBT but mental status precludes extubation. EEG/MRI negative.   Interim History / Subjective:  Tmax 100.4 / WBC 14.7  I/O 2.3L UOP, even for last 24 hours  Vent - PEEP 5 / 40% FiO2  Glucose range 251-296 RN reports no acute events.  Moves on  the left side spontaneously. No follow commands.   Objective   Blood pressure (!) 103/51, pulse (!) 57, temperature (!) 100.4 F (38 C), resp. rate 19, height 5\' 10"  (1.778 m), weight 79 kg, SpO2 100 %.    Vent Mode: PRVC FiO2 (%):  [40 %] 40 % Set Rate:  [15 bmp] 15 bmp Vt Set:  [510 mL] 510 mL PEEP:  [5 cmH20] 5 cmH20 Pressure Support:  [5 cmH20-8 cmH20] 8 cmH20 Plateau Pressure:  [16 cmH20-19 cmH20] 19 cmH20   Intake/Output Summary (Last 24 hours) at 10/03/2022 7322 Last data filed at 10/03/2022 0600 Gross per 24 hour  Intake 2175.61 ml  Output 2185 ml  Net -9.39 ml   Filed Weights   09/28/22 1600  Weight: 79 kg    Examination: General: elderly adult male lying in bed on vent in NAD  HEENT: MM pink/moist, ETT, anicteric, pupils equal Neuro: opens eyes to voice, does not follow commands, withdraws to pain CV: s1s2 RRR, no m/r/g, SR on monitor PULM: non-labored on vent, period of dyssynchrony with stimulation, diminished breath sounds with wheezing bilaterally, right chest tube to water seal GI: soft, bsx4 active  Extremities: warm/dry, generalized 2+ edema, bruising to left flank  Skin: no rashes or lesions  Resolved Hospital Problem list   N/A  Assessment & Plan:   S/p  PEA cardiac arrest in the setting of hypoxia due to aspiration event Demand cardiac ischemia post CPR PAF ROSC was achieved after 10 minutes. EEG remained negative for acute seizures. MRI showed no acute intracranial abnormalities. ECHO with preserved EF.  -supportive care  -follow neuro exam  -ensure hearing aides in place  -tele monitoring  -continue apixaban  Acute respiratory failure due to aspiration pneumonia Right sided pneumothorax, traumatic from CPR Left lower lobe pneumonia -PRVC with LTVV -wean PEEP / FiO2 for sats >90% -monitor off sedation to allow for neuro exam  -chest tube to water seal, care per protocol  -IV ABX, plan for 7 days -follow intermittent CXR  C. difficile  colitis -fidaxomicin  -enteric precautions  Acute metabolic encephalopathy, postarrest There is concern for anoxic brain injury though MRI is negative. EEG negative.  -hold sedation  -follow clinical exam   Diabetes mellitus II, poorly controlled with hyperglycemia -increase semglee to 16 units QD -increase TF coverage to 4 units  -SSI, sensitive scale   Macrocytic Anemia -B12, folate supplements  -trend CBC, transfuse for Hgb <7% -low dose apixaban   AKI on CKD 3b NAGMA, resolved -Trend BMP / urinary output -Replace electrolytes as indicated -Avoid nephrotoxic agents, ensure adequate renal perfusion  Shock liver, postcardiac arrest, improved -trend LFT's   Best Practice (right click and "Reselect all SmartList Selections" daily)  Diet/type: tubefeeds DVT prophylaxis: DOAC GI prophylaxis: PPI Lines: Central line Foley:  Yes, and it is still needed Code Status:  full code Last date of multidisciplinary goals of care discussion: 10/18.  Will update family on 10/21 on arrival.   Critical care time: 32 minutes    Canary Brim, MSN, APRN, NP-C, AGACNP-BC [facility] 10/03/2022, 8:28 AM   Please see [email or web address] for pager details.   From 7A-7P if no response, please call [phone] After hours, please call ELink [phone]

## 2022-10-03 NOTE — Progress Notes (Signed)
MD placed patient on cpap wean at this time.  Tolerating well at this time will monitor.

## 2022-10-03 NOTE — Plan of Care (Signed)
  Problem: Education: Goal: Ability to describe self-care measures that may prevent or decrease complications (Diabetes Survival Skills Education) will improve Outcome: Not Progressing Goal: Individualized Educational Video(s) Outcome: Not Progressing   Problem: Coping: Goal: Ability to adjust to condition or change in health will improve Outcome: Not Progressing   Problem: Fluid Volume: Goal: Ability to maintain a balanced intake and output will improve Outcome: Not Progressing   Problem: Health Behavior/Discharge Planning: Goal: Ability to identify and utilize available resources and services will improve Outcome: Not Progressing Goal: Ability to manage health-related needs will improve Outcome: Not Progressing   Problem: Metabolic: Goal: Ability to maintain appropriate glucose levels will improve Outcome: Not Progressing   Problem: Nutritional: Goal: Maintenance of adequate nutrition will improve Outcome: Not Progressing Goal: Progress toward achieving an optimal weight will improve Outcome: Not Progressing   Problem: Skin Integrity: Goal: Risk for impaired skin integrity will decrease Outcome: Not Progressing   Problem: Tissue Perfusion: Goal: Adequacy of tissue perfusion will improve Outcome: Not Progressing   Problem: Education: Goal: Knowledge of General Education information will improve Description: Including pain rating scale, medication(s)/side effects and non-pharmacologic comfort measures Outcome: Not Progressing   Problem: Health Behavior/Discharge Planning: Goal: Ability to manage health-related needs will improve Outcome: Not Progressing   Problem: Clinical Measurements: Goal: Ability to maintain clinical measurements within normal limits will improve Outcome: Not Progressing Goal: Will remain free from infection Outcome: Not Progressing Goal: Diagnostic test results will improve Outcome: Not Progressing Goal: Respiratory complications will  improve Outcome: Not Progressing Goal: Cardiovascular complication will be avoided Outcome: Not Progressing   Problem: Activity: Goal: Risk for activity intolerance will decrease Outcome: Not Progressing   Problem: Nutrition: Goal: Adequate nutrition will be maintained Outcome: Not Progressing   Problem: Coping: Goal: Level of anxiety will decrease Outcome: Not Progressing   Problem: Elimination: Goal: Will not experience complications related to bowel motility Outcome: Not Progressing Goal: Will not experience complications related to urinary retention Outcome: Not Progressing   Problem: Pain Managment: Goal: General experience of comfort will improve Outcome: Not Progressing   Problem: Safety: Goal: Ability to remain free from injury will improve Outcome: Not Progressing   Problem: Skin Integrity: Goal: Risk for impaired skin integrity will decrease Outcome: Not Progressing   Problem: Education: Goal: Ability to manage disease process will improve Outcome: Not Progressing   Problem: Cardiac: Goal: Ability to achieve and maintain adequate cardiopulmonary perfusion will improve Outcome: Not Progressing   Problem: Neurologic: Goal: Promote progressive neurologic recovery Outcome: Not Progressing   Problem: Skin Integrity: Goal: Risk for impaired skin integrity will be minimized. Outcome: Not Progressing

## 2022-10-04 DIAGNOSIS — J9601 Acute respiratory failure with hypoxia: Secondary | ICD-10-CM | POA: Diagnosis not present

## 2022-10-04 DIAGNOSIS — A0472 Enterocolitis due to Clostridium difficile, not specified as recurrent: Secondary | ICD-10-CM | POA: Diagnosis not present

## 2022-10-04 DIAGNOSIS — G9341 Metabolic encephalopathy: Secondary | ICD-10-CM | POA: Diagnosis not present

## 2022-10-04 LAB — COMPREHENSIVE METABOLIC PANEL
ALT: 26 U/L (ref 0–44)
AST: 31 U/L (ref 15–41)
Albumin: 1.9 g/dL — ABNORMAL LOW (ref 3.5–5.0)
Alkaline Phosphatase: 88 U/L (ref 38–126)
Anion gap: 9 (ref 5–15)
BUN: 74 mg/dL — ABNORMAL HIGH (ref 8–23)
CO2: 25 mmol/L (ref 22–32)
Calcium: 8.6 mg/dL — ABNORMAL LOW (ref 8.9–10.3)
Chloride: 109 mmol/L (ref 98–111)
Creatinine, Ser: 1.89 mg/dL — ABNORMAL HIGH (ref 0.61–1.24)
GFR, Estimated: 32 mL/min — ABNORMAL LOW (ref >60)
Glucose, Bld: 128 mg/dL — ABNORMAL HIGH (ref 70–99)
Potassium: 3.1 mmol/L — ABNORMAL LOW (ref 3.5–5.1)
Sodium: 143 mmol/L (ref 135–145)
Total Bilirubin: 1.2 mg/dL (ref 0.3–1.2)
Total Protein: 4.4 g/dL — ABNORMAL LOW (ref 6.5–8.1)

## 2022-10-04 LAB — GLUCOSE, CAPILLARY
Glucose-Capillary: 125 mg/dL — ABNORMAL HIGH (ref 70–99)
Glucose-Capillary: 125 mg/dL — ABNORMAL HIGH (ref 70–99)
Glucose-Capillary: 138 mg/dL — ABNORMAL HIGH (ref 70–99)
Glucose-Capillary: 123 mg/dL — ABNORMAL HIGH (ref 70–99)
Glucose-Capillary: 101 mg/dL — ABNORMAL HIGH (ref 70–99)
Glucose-Capillary: 85 mg/dL (ref 70–99)

## 2022-10-04 LAB — CBC
HCT: 32.4 % — ABNORMAL LOW (ref 39.0–52.0)
Hemoglobin: 10.4 g/dL — ABNORMAL LOW (ref 13.0–17.0)
MCH: 32 pg (ref 26.0–34.0)
MCHC: 32.1 g/dL (ref 30.0–36.0)
MCV: 99.7 fL (ref 80.0–100.0)
Platelets: 204 10*3/uL (ref 150–400)
RBC: 3.25 MIL/uL — ABNORMAL LOW (ref 4.22–5.81)
RDW: 14.8 % (ref 11.5–15.5)
WBC: 12.7 10*3/uL — ABNORMAL HIGH (ref 4.0–10.5)
nRBC: 0 % (ref 0.0–0.2)

## 2022-10-04 MED ORDER — POTASSIUM CHLORIDE 20 MEQ PO PACK
40.0000 meq | PACK | Freq: Once | ORAL | Status: AC
  Administered 2022-10-04: 40 meq
  Filled 2022-10-04: qty 2

## 2022-10-04 MED ORDER — INSULIN ASPART 100 UNIT/ML IJ SOLN
2.0000 [IU] | INTRAMUSCULAR | Status: DC
  Administered 2022-10-04: 2 [IU] via SUBCUTANEOUS

## 2022-10-04 MED ORDER — ACETAMINOPHEN 160 MG/5ML PO SOLN
650.0000 mg | Freq: Four times a day (QID) | ORAL | Status: AC
  Administered 2022-10-04: 650 mg
  Filled 2022-10-04 (×2): qty 20.3

## 2022-10-04 NOTE — Progress Notes (Signed)
Subjective: Continues to improve.   Exam: Vitals:   10/04/22 0400 10/04/22 0731  BP: (!) 125/51   Pulse: 61   Resp: 15   Temp: (!) 100.4 F (38 C) 99.9 F (37.7 C)  SpO2: 100%    Gen: In bed, NAD  Neuro: MS: eyes are open, he orients eyes to voice, but does not cross midline well to the right, attempts to track, but with impaired smooth pursuit.  CN: Right pupil is postsurgical, left pupil is reactive and round, blinks to eyelid stimulation bilaterally. He does blink to threat bilaterally Motor: he moves all extremities spontaneously, still with slightly less movement on the right than left, but markedly improved Sensory: responds briskly to noxious stimulation in all 4 extremities.    Pertinent Labs: Cr 2.07, trendign down.   Impression: 88 year old male with persistent encephalopathy after cardiac arrest.  He continues to show signs of progressive improvement. At this point, it is unclear where his improvement will plateau, but he already shows significant signs of consciousness and continues to make progress. I think there is a reasonable chance that he will continue to improve towards his previous premorbid neurological state, though this is not guaranteed. At this point, I think that prognosis decisions should be more based on the fact that he is a 88 year old who has been intubated for some time rather than assuming a dismal neurological state.   Given he has had an asymmetric exam, it is possible that aphasia is playing a role, but again degree of recovery and timeframe of recovery are very difficult to predict in this type of insult.   Recommendations: 1) would favor continued support from a purely neurological standpoint given progressive improvement, with the understanding that he is 19 with multiple medical problems and a prolonged intubation.  2) I discussed with the daughter that given his return of consciousness, there could be continued improvement, but impossible to  predict if and where he will plateau, and unlikely to return completely to his previous cognitive state.  3) Neurology will continue to be available for conversations on an as needed basis.    Roland Rack, MD [facility] [phone]  If 7pm- 7am, please page neurology on call as listed in [facility].

## 2022-10-04 NOTE — Progress Notes (Addendum)
NAME:  Daniel Cooke, MRN:  [id number], DOB:  10-Oct-1924, LOS: 9 ADMISSION DATE:  09/25/2022, CONSULTATION DATE:  09/28/22 REFERRING MD:  Code team, CHIEF COMPLAINT:  arrest   History of Present Illness:  88 year old man with hx of CKD, HLD, DM2 but otherwise fully functional who presented with recurrent colitis found to have C difficile infection.  Hospital course complicated by delirium.  Unfortunately he had what seems to be a aspiration event then had cardiac arrest.  ROSC within 10 mins.  Overbreathing vent.  PCCM consulted to assist with post-CPR management.  Intubated and not following commands initially. More alert 10/22 - moving spontaneously, followed commands on LUE.   Pertinent  Medical History   Past Medical History:  Diagnosis Date   Arthritis    "was in left knee before replacement; now have it in my right knee" (11/25/2016)   BPH (benign prostatic hypertrophy)    Chronic diastolic CHF (congestive heart failure) (HCC)    a. 02/2016 Echo: EF 60-65%, no rwma, triv AI, mild MR, mildly to mod dil LA, mod dil RA, PASP .   Dysrhythmia    Essential hypertension    Hard of hearing    wears bilateral hearing aids   Hypercholesterolemia    Osteoarthritis    PAF (paroxysmal atrial fibrillation) (HCC)    a. CHADS2VASC score of 4 --> coumadin.   Pilonidal cyst    PAST HX - NO PROBLEM NOW   Pneumonia    "one time; years ago" (11/25/2016)   Shingles    "long time ago"   Synovitis of knee 02/27/2014   Type II diabetes mellitus (HCC)    oral meds - no insulin    Significant Hospital Events: Including procedures, antibiotic start and stop dates in addition to other pertinent events   10/13 admit with C. Diff. 10/16 cardiac arrest 10/17 pneumothorax> chest tube placed. EEG with burst suppression.  10/20 SBT but mental status precludes extubation. EEG/MRI negative.  10/21 Eyes open, squeezed left hand to command  Interim History / Subjective:  RN reports pt more alert,  following some commands Tmax 100 / WBC 12.7 I/O UOP, +755ml in last 24 hours  Glucose range 125-134   Objective   Blood pressure (!) 131/53, pulse 74, temperature 100 F (37.8 C), resp. rate 17, height 5\' 10"  (1.778 m), weight 79 kg, SpO2 100 %.    Vent Mode: PRVC FiO2 (%):  [40 %] 40 % Set Rate:  [15 bmp] 15 bmp Vt Set:  [510 mL] 510 mL PEEP:  [5 cmH20] 5 cmH20 Pressure Support:  [10 cmH20] 10 cmH20 Plateau Pressure:  [8 cmH20] 8 cmH20   Intake/Output Summary (Last 24 hours) at 10/04/2022 0836 Last data filed at 10/04/2022 0800 Gross per 24 hour  Intake 1663.6 ml  Output 1200 ml  Net 463.6 ml   Filed Weights   09/28/22 1600  Weight: 79 kg    Examination: General: elderly male lying in bed in NAD on vent   HEENT: MM pink/moist, ETT, anicteric, pupils =/reactive Neuro: eyes open, makes eye contact, raises UE's spontaneously, squeezed left hand to command CV: s1s2 rrr, SR on monitor, no m/r/g PULM: non-labored at rest, lungs bilaterally clear, R CT to water seal GI: soft, bsx4 active  Extremities: warm/dry, 1+ edema  Skin: no rashes or lesions  Resolved Hospital Problem list   N/A  Assessment & Plan:   S/p PEA Arrest in the setting of Hypoxia due to Aspiration event Demand  Ischemia post CPR PAF ROSC was achieved after 10 minutes. EEG remained negative for acute seizures. MRI showed no acute intracranial abnormalities. ECHO with preserved EF.  -ensure hearing aides in place  -follow clinical neuro exam, slowly improving  -tele monitoring  -apixaban PT   Acute respiratory failure due to aspiration pneumonia Right sided pneumothorax, traumatic from CPR Left lower lobe pneumonia -PRVC with LTVV -wean PEEP / FiO2 for sats >90% -SBT ok, mental status precludes extubation 10/22  -follow off sedation  -chest tube to water seal, chest tube care per protocol  -zosyn, plan for 7 days, stop date in place -follow intermittent CXR   C. difficile  colitis -continue fidaxomicin  -enteric precautions  -flexi seal   Acute Metabolic Encephalopathy, postarrest There is concern for some degree of anoxic brain injury though MRI is negative. EEG negative. Slowly waking, exam improved 10/22  -hold sedation unless patient appears uncomfortable / in pain  -neuro protective measures  -PT efforts when able  -scheduled tylenol for 4 doses, PRN available   Diabetes mellitus II, poorly controlled with hyperglycemia -glucose control improved, monitor for hypoglycemia  -continue semglee 16 units QD  -reduce TF coverage to 2 units  -SSI, sensitive scale   Macrocytic Anemia -B12, folate supplements  -trend CBC, transfuse for Hgb <7% -low dose apixaban   AKI on CKD 3b NAGMA, resolved Hypokalemia  -Trend BMP / urinary output -Replace electrolytes as indicated, KCL 10/22 -consider lasix 10/23  -Avoid nephrotoxic agents, ensure adequate renal perfusion  Shock liver, postcardiac arrest, improved -monitor LFT's   Best Practice (right click and "Reselect all SmartList Selections" daily)  Diet/type: tubefeeds DVT prophylaxis: DOAC GI prophylaxis: PPI Lines: Central line Foley:  Yes, and it is still needed Code Status:  full code Last date of multidisciplinary goals of care discussion: 10/18.  Daughter updated per Dr. Leonel Ramsay 10/22.  Will follow up when family arrives.   Critical care time: 78 minutes    Noe Gens, MSN, APRN, NP-C, AGACNP-BC [facility] 10/04/2022, 8:36 AM   Please see [email or web address] for pager details.   From 7A-7P if no response, please call [phone] After hours, please call ELink [phone]

## 2022-10-04 NOTE — Progress Notes (Signed)
Patients daughter, [name], updated on patients self-extubation.  Tolerating thus far.  Updated on plan of care. Confirmed she would want re-intubation if fails.     Noe Gens, MSN, APRN, NP-C, AGACNP-BC [facility] 10/04/2022, 12:20 PM   Please see [email or web address] for pager details.   From 7A-7P if no response, please call [phone] After hours, please call ELink [phone]

## 2022-10-05 ENCOUNTER — Inpatient Hospital Stay (HOSPITAL_COMMUNITY): Payer: Medicare Other

## 2022-10-05 DIAGNOSIS — R627 Adult failure to thrive: Secondary | ICD-10-CM | POA: Diagnosis not present

## 2022-10-05 DIAGNOSIS — Z515 Encounter for palliative care: Secondary | ICD-10-CM

## 2022-10-05 DIAGNOSIS — A0472 Enterocolitis due to Clostridium difficile, not specified as recurrent: Secondary | ICD-10-CM | POA: Diagnosis not present

## 2022-10-05 DIAGNOSIS — N289 Disorder of kidney and ureter, unspecified: Secondary | ICD-10-CM

## 2022-10-05 DIAGNOSIS — G9341 Metabolic encephalopathy: Secondary | ICD-10-CM | POA: Diagnosis not present

## 2022-10-05 LAB — BASIC METABOLIC PANEL
Anion gap: 11 (ref 5–15)
BUN: 74 mg/dL — ABNORMAL HIGH (ref 8–23)
CO2: 24 mmol/L (ref 22–32)
Calcium: 9.4 mg/dL (ref 8.9–10.3)
Chloride: 111 mmol/L (ref 98–111)
Creatinine, Ser: 1.89 mg/dL — ABNORMAL HIGH (ref 0.61–1.24)
GFR, Estimated: 32 mL/min — ABNORMAL LOW (ref >60)
Glucose, Bld: 104 mg/dL — ABNORMAL HIGH (ref 70–99)
Potassium: 3.6 mmol/L (ref 3.5–5.1)
Sodium: 146 mmol/L — ABNORMAL HIGH (ref 135–145)

## 2022-10-05 LAB — CBC
HCT: 34.2 % — ABNORMAL LOW (ref 39.0–52.0)
Hemoglobin: 10.8 g/dL — ABNORMAL LOW (ref 13.0–17.0)
MCH: 31.8 pg (ref 26.0–34.0)
MCHC: 31.6 g/dL (ref 30.0–36.0)
MCV: 100.6 fL — ABNORMAL HIGH (ref 80.0–100.0)
Platelets: 221 10*3/uL (ref 150–400)
RBC: 3.4 MIL/uL — ABNORMAL LOW (ref 4.22–5.81)
RDW: 14.9 % (ref 11.5–15.5)
WBC: 14.6 10*3/uL — ABNORMAL HIGH (ref 4.0–10.5)
nRBC: 0 % (ref 0.0–0.2)

## 2022-10-05 LAB — GLUCOSE, CAPILLARY
Glucose-Capillary: 101 mg/dL — ABNORMAL HIGH (ref 70–99)
Glucose-Capillary: 192 mg/dL — ABNORMAL HIGH (ref 70–99)
Glucose-Capillary: 209 mg/dL — ABNORMAL HIGH (ref 70–99)
Glucose-Capillary: 240 mg/dL — ABNORMAL HIGH (ref 70–99)
Glucose-Capillary: 88 mg/dL (ref 70–99)
Glucose-Capillary: 94 mg/dL (ref 70–99)

## 2022-10-05 MED ORDER — GUAIFENESIN 100 MG/5ML PO LIQD: 5.0000 mL | Freq: Two times a day (BID) | ORAL | Status: DC

## 2022-10-05 MED ORDER — INSULIN GLARGINE-YFGN 100 UNIT/ML ~~LOC~~ SOLN
10.0000 [IU] | Freq: Every day | SUBCUTANEOUS | Status: DC
  Administered 2022-10-05 – 2022-10-06 (×2): 10 [IU] via SUBCUTANEOUS
  Filled 2022-10-05 (×2): qty 0.1

## 2022-10-05 MED ORDER — POTASSIUM CHLORIDE 20 MEQ PO PACK
20.0000 meq | PACK | Freq: Once | ORAL | Status: DC
  Filled 2022-10-05: qty 1

## 2022-10-05 MED ORDER — FUROSEMIDE 10 MG/ML IJ SOLN
40.0000 mg | Freq: Once | INTRAMUSCULAR | Status: AC
  Administered 2022-10-05: 40 mg via INTRAVENOUS

## 2022-10-05 MED ORDER — PROSOURCE TF20 ENFIT COMPATIBL EN LIQD
60.0000 mL | Freq: Every day | ENTERAL | Status: DC
  Administered 2022-10-05 – 2022-10-07 (×3): 60 mL
  Filled 2022-10-05 (×3): qty 60

## 2022-10-05 MED ORDER — FAMOTIDINE 40 MG/5ML PO SUSR
10.0000 mg | ORAL | Status: DC
  Administered 2022-10-07 – 2022-10-11 (×3): 10.4 mg
  Filled 2022-10-05 (×4): qty 2.5

## 2022-10-05 MED ORDER — INSULIN ASPART 100 UNIT/ML IJ SOLN
3.0000 [IU] | INTRAMUSCULAR | Status: DC
  Administered 2022-10-05 – 2022-10-06 (×5): 3 [IU] via SUBCUTANEOUS

## 2022-10-05 MED ORDER — GUAIFENESIN 100 MG/5ML PO LIQD: 5.0000 mL | ORAL | Status: DC | PRN

## 2022-10-05 MED ORDER — GUAIFENESIN 100 MG/5ML PO LIQD
5.0000 mL | Freq: Two times a day (BID) | ORAL | Status: DC
  Administered 2022-10-05 – 2022-10-13 (×17): 5 mL
  Filled 2022-10-05 (×8): qty 5
  Filled 2022-10-05: qty 10
  Filled 2022-10-05 (×3): qty 5
  Filled 2022-10-05 (×2): qty 10
  Filled 2022-10-05 (×3): qty 5

## 2022-10-05 MED ORDER — OSMOLITE 1.5 CAL PO LIQD
1000.0000 mL | ORAL | Status: DC
  Administered 2022-10-05 – 2022-10-06 (×3): 1000 mL
  Filled 2022-10-05 (×3): qty 1000

## 2022-10-05 MED ORDER — POTASSIUM CHLORIDE 10 MEQ/100ML IV SOLN
10.0000 meq | INTRAVENOUS | Status: AC
  Administered 2022-10-05 (×2): 10 meq via INTRAVENOUS
  Filled 2022-10-05 (×2): qty 100

## 2022-10-05 NOTE — Progress Notes (Signed)
Patient ID: Daniel Cooke, male   DOB: 1924-10-24, 88 y.o.   MRN: [id number]    Progress Note from the Palliative Medicine Team at [facility]   Patient Name: Daniel Cooke        Date: 10/05/2022 DOB: September 08, 1924  Age: 88 y.o. MRN#: [id number] Attending Physician: Juanito Doom, MD Primary Care Physician: Glenis Smoker, MD Admit Date: 09/25/2022   Medical records reviewed   88 year old man with hx of CKD, HLD, DM2 but otherwise fully functional who presented with recurrent colitis found to have C difficile infection.  Hospital course complicated by delirium.  Unfortunately  had what seems to be a aspiration event then had cardiac arrest.  ROSC within 10 mins.      Family face ongoing  treatment option decisions, advanced directive decisions and anticipatory care needs.   10/13 admit with C. Diff. 10/16 cardiac arrest 10/17 pneumothorax> chest tube placed. EEG with burst suppression.  10/20 SBT but mental status precludes extubation. EEG/MRI negative.  10/21 Eyes open, squeezed left hand to command 10/22 Self Extubated, mildly agitated, on 2 L [place], required NTS for secretion management 10/23 Cortrack placed for feedings    This NP assessed patient at the bedside as a follow up for palliative medicine needs support.  Patient's caregiver/Nellie bedside.  Discussed case with Dr. Lake Bells and bedside RN  Placed  call and spoke to daughter [name] by telephone, continue conversation regarding current medical situation, specifically to concern for significant neurologic injury and likely long-term poor prognosis.    Daughter is "seeing some progress".  Education offered on concern for need for reintubation and overall failure to thrive.  Family remain hopeful for continued improvement and return to baseline  Plan of Care: -Full code    Educated family to consider DNR/DNI status understanding evidenced based poor outcomes in similar hospitalized patient, as the cause of  arrest is likely associated with advanced chronic illness rather than an easily reversible acute cardio-pulmonary event.   Education offered today regarding  the importance of continued conversation with family and their  medical providers regarding overall plan of care and treatment options,  ensuring decisions are within the context of the patients values and GOCs.  Questions and concerns addressed   Discussed with Dr Waunita Schooner and bedside RN  50 minutes  PMT will continue to support holistically   Wadie Lessen NP  Palliative Medicine Team Team Phone # [phone] Pager [phone]

## 2022-10-05 NOTE — Procedures (Signed)
Cortrak  Tube Type:  Cortrak - 43 inches Tube Location:  Right nare Initial Placement:  Stomach Secured by: Bridle Technique Used to Measure Tube Placement:  Marking at nare/corner of mouth Cortrak Secured At:  63 cm   Cortrak Tube Team Note:  Consult received to place a Cortrak feeding tube.   X-ray is required, abdominal x-ray has been ordered by the Cortrak team. Please confirm tube placement before using the Cortrak tube.   If the tube becomes dislodged please keep the tube and contact the Cortrak team at [email or web address] for replacement.  If after hours and replacement cannot be delayed, place a NG tube and confirm placement with an abdominal x-ray.    Koleen Distance MS, RD, LDN Please refer to [facility] for RD and/or RD on-call/weekend/after hours pager

## 2022-10-05 NOTE — Evaluation (Signed)
Clinical/Bedside Swallow Evaluation Patient Details  Name: Daniel Cooke MRN: [id number] Date of Birth: 1924-04-06  Today's Date: 10/05/2022 Time: SLP Start Time (ACUTE ONLY): 1856 SLP Stop Time (ACUTE ONLY): 0942 SLP Time Calculation (min) (ACUTE ONLY): 15 min  Past Medical History:  Past Medical History:  Diagnosis Date   Arthritis    "was in left knee before replacement; now have it in my right knee" (11/25/2016)   BPH (benign prostatic hypertrophy)    Chronic diastolic CHF (congestive heart failure) (HCC)    a. 02/2016 Echo: EF 60-65%, no rwma, triv AI, mild MR, mildly to mod dil LA, mod dil RA, PASP .   Dysrhythmia    Essential hypertension    Hard of hearing    wears bilateral hearing aids   Hypercholesterolemia    Osteoarthritis    PAF (paroxysmal atrial fibrillation) (HCC)    a. CHADS2VASC score of 4 --> coumadin.   Pilonidal cyst    PAST HX - NO PROBLEM NOW   Pneumonia    "one time; years ago" (11/25/2016)   Shingles    "long time ago"   Synovitis of knee 02/27/2014   Type II diabetes mellitus (HCC)    oral meds - no insulin   Past Surgical History:  Past Surgical History:  Procedure Laterality Date   APPENDECTOMY     CARDIOVERSION N/A 02/04/2017   Procedure: CARDIOVERSION;  Surgeon: [name], MD;  Location: [facility] ENDOSCOPY;  Service: Cardiovascular;  Laterality: N/A;   CATARACT EXTRACTION W/ INTRAOCULAR LENS IMPLANT Left 10/2016   CHOLECYSTECTOMY OPEN     COLONOSCOPY W/ BIOPSIES  08/2005   [name] 04/27/2011   EYE SURGERY Right    "right" traumatic cataract removed--injury to the eye--states his eyesight is ok in left eye   JOINT REPLACEMENT     KNEE ARTHROSCOPY Left 02/28/2014   Procedure: ARTHROSCOPY LEFT KNEE WITH SYNOVECTOMY;  Surgeon: [name], MD;  Location: [facility] ORS;  Service: Orthopedics;  Laterality: Left;   KYPHOPLASTY  2017   KYPHOPLASTY N/A 05/28/2017   Procedure: Lumbar Four Kyphoplasty;  Surgeon: Maeola Harman, MD;  Location: [facility]  OR;  Service: Neurosurgery;  Laterality: N/A;  L4 Kyphoplasty   SHOULDER OPEN ROTATOR CUFF REPAIR Left    TONSILLECTOMY     TOTAL KNEE ARTHROPLASTY  02/08/2012   Procedure: TOTAL KNEE ARTHROPLASTY;  Surgeon: [name], MD;  Location: [facility] ORS;  Service: Orthopedics;  Laterality: Left;   VASECTOMY     HPI:  88 year old man with hx of CKD, HLD, DM2 but otherwise fully functional who presented with recurrent colitis found to have C difficile infection.  Hospital course complicated by delirium.  10/16 had possible aspiration event/ cardiac arrest.  ROSC within 10 mins. Intubated 10/16-22, when he self-extubated.  Swallow initially assessed 10/15.    Assessment / Plan / Recommendation  Clinical Impression  Completed preliminary clinical swallowing assessment. Pt presents with impaired mentation. He is not following commands.  Voice low volume/wet.  Performed oral care, removing thick, tan secretions; pt continued to bite down on suction catheter.  He did not demonstrate any spontaneous swallowing during oral care.  Per notes, he has required NT suctioning since extubation. Did not proceed with any PO trials given mental status today.  Recommend continued NPO; cortrak is pending. SLP will follow for improvements, readiness for instrumental swallow study. SLP Visit Diagnosis: Dysphagia, unspecified (R13.10)    Aspiration Risk  Severe aspiration risk    Diet Recommendation   NPO  Medication Administration: Via alternative means    Other  Recommendations Oral Care Recommendations: Oral care QID    Recommendations for follow up therapy are one component of a multi-disciplinary discharge planning process, led by the attending physician.  Recommendations may be updated based on patient status, additional functional criteria and insurance authorization.  Follow up Recommendations Other (comment) (tba)              Frequency and Duration min 3x week  2 weeks       Prognosis Prognosis for  Safe Diet Advancement: Guarded      Swallow Study   General Date of Onset: 09/25/22 HPI: 88 year old man with hx of CKD, HLD, DM2 but otherwise fully functional who presented with recurrent colitis found to have C difficile infection.  Hospital course complicated by delirium.  10/16 had possible aspiration event/ cardiac arrest.  ROSC within 10 mins. Intubated 10/16-22, when he self-extubated.  Swallow initially assessed 10/15. Type of Study: Bedside Swallow Evaluation Previous Swallow Assessment:  (10/15) Diet Prior to this Study: NPO Temperature Spikes Noted: No Respiratory Status: Nasal cannula History of Recent Intubation: Yes Length of Intubations (days): 7 days Date extubated: 10/04/22 Behavior/Cognition: Confused;Lethargic/Drowsy Oral Cavity Assessment: Excessive secretions Oral Care Completed by SLP: Yes Oral Cavity - Dentition: Adequate natural dentition Self-Feeding Abilities: Total assist Patient Positioning: Upright in bed Baseline Vocal Quality: Low vocal intensity Volitional Cough: Cognitively unable to elicit Volitional Swallow: Unable to elicit    Oral/Motor/Sensory Function Overall Oral Motor/Sensory Function: Other (comment) (no apparent focal deficits)   Ice Chips Ice chips: Not tested   Thin Liquid Thin Liquid: Not tested                   Estill Bamberg L. Tivis Ringer, MA CCC/SLP Clinical Specialist - Acute Care SLP Acute Rehabilitation Services Office number [phone]         Juan Quam Laurice 10/05/2022,10:00 AM

## 2022-10-05 NOTE — Progress Notes (Signed)
NAME:  Daniel Cooke, MRN:  [id number], DOB:  1924-03-19, LOS: 64 ADMISSION DATE:  09/25/2022, CONSULTATION DATE:  09/28/22 REFERRING MD:  Code team, CHIEF COMPLAINT:  arrest   History of Present Illness:  88 year old man with hx of CKD, HLD, DM2 but otherwise fully functional who presented with recurrent colitis found to have C difficile infection.  Hospital course complicated by delirium.  Unfortunately he had what seems to be a aspiration event then had cardiac arrest.  ROSC within 10 mins.  Overbreathing vent.  PCCM consulted to assist with post-CPR management.  Intubated and not following commands initially. More alert 10/22 - moving spontaneously, followed commands on LUE.   Pertinent  Medical History   Past Medical History:  Diagnosis Date   Arthritis    "was in left knee before replacement; now have it in my right knee" (11/25/2016)   BPH (benign prostatic hypertrophy)    Chronic diastolic CHF (congestive heart failure) ([place])    a. 02/2016 Echo: EF 60-65%, no rwma, triv AI, mild MR, mildly to mod dil LA, mod dil RA, PASP 28mmHg.   Dysrhythmia    Essential hypertension    Hard of hearing    wears bilateral hearing aids   Hypercholesterolemia    Osteoarthritis    PAF (paroxysmal atrial fibrillation) ([place])    a. CHADS2VASC score of 4 --> coumadin.   Pilonidal cyst    PAST HX - NO PROBLEM NOW   Pneumonia    "one time; years ago" (11/25/2016)   Shingles    "long time ago"   Synovitis of knee 02/27/2014   Type II diabetes mellitus ([place])    oral meds - no insulin    Significant Hospital Events: Including procedures, antibiotic start and stop dates in addition to other pertinent events   10/13 admit with C. Diff. 10/16 cardiac arrest 10/17 pneumothorax> chest tube placed. EEG with burst suppression.  10/20 SBT but mental status precludes extubation. EEG/MRI negative.  10/21 Eyes open, squeezed left hand to command 10/23 Self Extubated 10/22, mildly agitated, on 2 L [place],  has been requiring NTS for secretion management  Interim History / Subjective:  RN reports pt more alert, rarely follows commands for nursing Mild agitation, clearly stated " I want Out" while I was in the room Tmax 100 / WBC 14.6, which is up from 12.7 I/O UOP, +775 ml in last 24 hours , 120 cc per CT, Net + 10.3 L since admission Glucose range 88-138 Creatinine 1.89>> 600 cc's UO  last 24 hours Albumin 1.9 when checked 10/22  Objective   Blood pressure (!) 149/62, pulse 67, temperature 98.7 F (37.1 C), temperature source Axillary, resp. rate (!) 25, height 5\' 10"  (1.778 m), weight 79 kg, SpO2 100 %.    FiO2 (%):  [40 %] 40 %   Intake/Output Summary (Last 24 hours) at 10/05/2022 6160 Last data filed at 10/05/2022 7371 Gross per 24 hour  Intake 660.96 ml  Output 790 ml  Net -129.04 ml   Filed Weights   09/28/22 1600  Weight: 79 kg    Examination: General: elderly male lying in bed on 2 L [place],mild agitation HEENT: MM dry,  anicteric, pupils =/reactive, thick secretions noted in his mouth Neuro: eyes open, makes eye contact, raises UE's spontaneously, did not follow any commands for me today, but did state he wants out, makes eye contact and tracks CV: s1s2 rrr, SR on monitor, no m/r/g PULM: non-labored at rest, lungs bilaterally clear  to coarse, diminished per bases, R CT to water seal, no leak noted  GI: soft, bsx4 active  Extremities: warm/dry, 1+ edema , no obvious deformities Skin: no rashes or lesions,   Resolved Hospital Problem list   N/A  Assessment & Plan:   S/p PEA Arrest in the setting of Hypoxia due to Aspiration event Demand Ischemia post CPR PAF ROSC was achieved after 10 minutes. EEG remained negative for acute seizures. MRI showed no acute intracranial abnormalities. ECHO with preserved EF.  -ensure hearing aides in place  -follow clinical neuro exam, slowly improving  -tele monitoring  -apixaban PT   Acute respiratory failure due to aspiration  pneumonia Right sided pneumothorax, traumatic from CPR Left lower lobe pneumonia>> Leukocytosis Self Extubated 10/22, on 2 L [place]>> Copious , thick secretions  - Wean oxygen  for sats >90% - Continue NTS prn , Not managing thick secretions  - Add mucinex scheduled Q 12 to help thin secretions  -continue to follow off sedation  -chest tube to water seal, chest tube care per protocol  -zosyn, plan for 7 days, stop date in place -follow intermittent CXR  - Swallow eval post extubation . But will most  likely need Cor Track replaced 10/23 as poor secretion management   C. difficile colitis -continue fidaxomicin  -enteric precautions  -flexi seal   Acute Metabolic Encephalopathy, postarrest There is concern for some degree of anoxic brain injury though MRI is negative. EEG negative. Slowly waking, exam continues to  improved 10/23 -hold sedation unless patient appears uncomfortable / in pain  -neuro protective measures  -PT efforts when able  -scheduled tylenol for 4 doses, PRN available   Diabetes mellitus II, poorly controlled with hyperglycemia -glucose control improved, monitor for hypoglycemia  -continue semglee 16 units QD  -reduce TF coverage to 2 units  -SSI, sensitive scale   Macrocytic Anemia -B12, folate supplements  -trend CBC, transfuse for Hgb <7% -low dose apixaban   AKI on CKD 3b NAGMA, resolved Hypokalemia  -Trend BMP / urinary output -Replace electrolytes as indicated, KCL 10/22 -consider lasix 10/23  -Avoid nephrotoxic agents, ensure adequate renal perfusion  Shock liver, postcardiac arrest, improved -monitor LFT's   Best Practice (right click and "Reselect all SmartList Selections" daily)  Diet/type: tubefeeds DVT prophylaxis: DOAC GI prophylaxis: PPI Lines: Central line Foley:  Yes, and it is still needed Code Status:  full code Last date of multidisciplinary goals of care discussion: 10/18.  Daughter updated per Dr. Amada Jupiter 10/22.  Will follow  up when family arrives.   Critical care time: 45 minutes    Bevelyn Ngo, MSN, AGACNP-BC [facility] Pulmonary/Critical Care Medicine See Amion for personal pager PCCM on call pager [phone]  10/05/2022, 9:18 AM   Please see [email or web address] for pager details.   From 7A-7P if no response, please call [phone] After hours, please call ELink [phone]

## 2022-10-05 NOTE — Progress Notes (Signed)
Nutrition Follow-up  DOCUMENTATION CODES:   Not applicable  INTERVENTION:   Initiate tube feeds via Cortrak: - Osmolite 1.5 @ 50 ml/hr (1200 ml/day) - PROSource TF20 60 ml daily  Tube feeding regimen provides 1880 kcal, 95 grams of protein, and 914 ml of H2O.   NUTRITION DIAGNOSIS:   Inadequate oral intake related to inability to eat as evidenced by NPO status.  Ongoing, being addressed via TF  GOAL:   Patient will meet greater than or equal to 90% of their needs  Met via TF  MONITOR:   Diet advancement, Labs, Weight trends, Skin, I & O's, TF tolerance  REASON FOR ASSESSMENT:   Ventilator, Consult Enteral/tube feeding initiation and management (trickle tube feeds)  ASSESSMENT:   88 year old male who presented to the ED on 10/13 wit AMS. PMH of HTN, atrial fibrillation, CKD stage IIIb, T2DM, BPH, recent hospitalization for acute diverticulitis of the sigmoid colon. Pt admitted with C. difficile colitis, SIRS, acute metabolic encephalopathy.  10/16 - Code Blue, intubated 10/17 - chest tube placed for R pneumothorax, trickle TF started 10/18 - TF advanced to goal 10/22 - self-extubated 10/23 - Cortrak placed (tip gastric)  Discussed pt with RN and during ICU rounds. SLP recommending pt remain NPO. Cortrak placed today and plan to restart tube feeds. No family present at bedside at time of RD visit.  Medications reviewed and include: vitamin B12 1000 mcg daily, pepcid, dificid, folic acid, SSI q 4 hours, novolog 3 units q 4 hours, semglee 10 units daily, sodium bicarb 650 mg TID, IV abx  Labs reviewed: sodium 146, BUN 74, creatinine 1.89, WBC 14.6, hemoglobin 10.8 CBG's: 85-123 x 24 hours  UOP: 775 ml x 24 hours Chest tube: 120 ml x 24 hours I/O's: +10.0 L since admit  Diet Order:   Diet Order             Diet NPO time specified  Diet effective now                   EDUCATION NEEDS:   No education needs have been identified at this time  Skin:   Skin Assessment: Skin Integrity Issues: Stage II: coccyx  Last BM:  10/04/22 rectal tube  Height:   Ht Readings from Last 1 Encounters:  09/28/22 _0  (1.778 m)    Weight:   Wt Readings from Last 1 Encounters:  09/28/22 79 kg   BMI:  Body mass index is 24.99 kg/m.  Estimated Nutritional Needs:   Kcal:  1700-1900  Protein:  90-110 grams  Fluid:  1.7-1.9 L    Gustavus Bryant, MS, RD, LDN Inpatient Clinical Dietitian Please see AMiON for contact information.

## 2022-10-05 NOTE — TOC Progression Note (Signed)
Transition of Care [facility]) - Initial/Assessment Note    Patient Details  Name: Daniel Cooke MRN: [id number] Date of Birth: 1923-12-16  [facility]) CM/SW Contact:    Milinda Antis, LCSWA Phone Number: 10/05/2022, 11:13 AM  Clinical Narrative:                 Transition of Care Department [facility]) has reviewed patient.  Patient self extubated on this date and is currently on 2L. We will continue to monitor patient advancement through interdisciplinary progression rounds.    Patient Goals and CMS Choice        Expected Discharge Plan and Services                                                Prior Living Arrangements/Services                       Activities of Daily Living      Permission Sought/Granted                  Emotional Assessment              Admission diagnosis:  Diverticulitis [K57.92] Elevated troponin [R79.89] AKI (acute kidney injury) ([place]) [N17.9] C. difficile colitis [A04.72] Altered mental status, unspecified altered mental status type [R41.82] Patient Active Problem List   Diagnosis Date Noted   Acute respiratory failure with hypoxia ([place])    Pressure injury of skin 10/01/2022   C. difficile colitis 09/25/2022   Leukocytosis 09/25/2022   SIRS (systemic inflammatory response syndrome) ([place]) 123XX123   Acute metabolic encephalopathy 123XX123   Renal insufficiency 09/25/2022   Compression fracture of body of thoracic vertebra ([place]) 09/25/2022   GERD (gastroesophageal reflux disease) 09/25/2022   Elevated troponin 09/25/2022   Diverticulitis 09/12/2022   Acute respiratory disease due to COVID-19 virus 05/20/2021   Acute renal failure superimposed on stage 3b chronic kidney disease ([place]) [id number]   Complicated urinary tract infection 11/24/2019   Back pain 11/24/2019   Generalized weakness 11/24/2019   Persistent atrial fibrillation ([place]) 11/15/2019   Acquired thrombophilia ([place]) 11/15/2019    Sensorineural hearing loss (SNHL) of both ears 11/08/2018   Pain in left knee 07/28/2018   Type II diabetes mellitus ([place])    Shingles    Pneumonia    Pilonidal cyst    AF (paroxysmal atrial fibrillation) ([facility])    Osteoarthritis    Hypercholesterolemia    Hard of hearing    Essential hypertension    Dysrhythmia    Chronic diastolic CHF (congestive heart failure) ([facility])    Arthritis    Compression fracture of fourth lumbar vertebra ([place]) 05/28/2017   Acute on chronic diastolic CHF (congestive heart failure) ([place]) 11/27/2016   Chronic kidney disease, stage 3b ([place]) 11/25/2016   Anemia of chronic disease 11/25/2016   HTN (hypertension) 11/25/2016   HLD (hyperlipidemia) 11/25/2016   Type 2 diabetes mellitus with stage 3 chronic kidney disease, without long-term current use of insulin ([place]) 11/25/2016   Bilateral impacted cerumen 09/15/2016   Encounter for therapeutic drug monitoring 03/20/2016   Synovitis of knee 02/27/2014   OA (osteoarthritis) of knee 02/08/2012   PCP:  Glenis Smoker, MD Pharmacy:   [place] ([place] - [address] [place] [address]  Phone: [phone] Fax: [phone]  [id number] - [address] [place] [address] Phone: [phone] Fax: [phone]     Social Determinants of Health (SDOH) Interventions    Readmission Risk Interventions    09/14/2022    1:30 PM  Readmission Risk Prevention Plan  Post Dischage Appt Complete  Medication Screening Complete  Transportation Screening Complete

## 2022-10-05 NOTE — Progress Notes (Signed)
Performed NTS on patient. Moderate amount of thick, tan secretions obtained. Patient's saturation remains at 100% on 2L [place].

## 2022-10-05 NOTE — Progress Notes (Signed)
Attending:    Subjective: Admitted with colitis, c.diff Had aspiration event in hospital, had cardiac arrest in hospital for 10 minutes Right sided chest tube placed for R pneumothorax after Self extubated 10/22, requiring frequent suctioning SLP seeing him today Needs cor-track  Objective: Vitals:   10/05/22 0500 10/05/22 0600 10/05/22 0700 10/05/22 0753  BP: (!) 141/77 137/67 (!) 149/62   Pulse: 78 71 67   Resp: (!) 24 20 (!) 25   Temp:    98.7 F (37.1 C)  TempSrc:    Axillary  SpO2: 100% 100% 100%   Weight:      Height:       FiO2 (%):  [40 %] 40 %  Intake/Output Summary (Last 24 hours) at 10/05/2022 1024 Last data filed at 10/05/2022 2202 Gross per 24 hour  Intake 579.48 ml  Output 790 ml  Net -210.52 ml    General:  Resting comfortably in bed HENT: NCAT OP clear NG in place PULM: CTA B, normal effort CV: RRR, no mgr GI: BS+, soft, nontender MSK: normal bulk and tone Neuro: awake, moans are unintelligible, maew    CBC    Component Value Date/Time   WBC 14.6 (H) 10/05/2022 0517   RBC 3.40 (L) 10/05/2022 0517   HGB 10.8 (L) 10/05/2022 0517   HGB 11.7 (L) 05/10/2019 1218   HCT 34.2 (L) 10/05/2022 0517   HCT 36.4 (L) 05/10/2019 1218   PLT 221 10/05/2022 0517   PLT 195 05/10/2019 1218   MCV 100.6 (H) 10/05/2022 0517   MCV 97 05/10/2019 1218   MCH 31.8 10/05/2022 0517   MCHC 31.6 10/05/2022 0517   RDW 14.9 10/05/2022 0517   RDW 12.7 05/10/2019 1218   LYMPHSABS 1.1 09/25/2022 0055   MONOABS 0.5 09/25/2022 0055   EOSABS 0.0 09/25/2022 0055   BASOSABS 0.1 09/25/2022 0055    BMET    Component Value Date/Time   NA 146 (H) 10/05/2022 0517   NA 142 05/10/2019 1218   K 3.6 10/05/2022 0517   CL 111 10/05/2022 0517   CO2 24 10/05/2022 0517   GLUCOSE 104 (H) 10/05/2022 0517   BUN 74 (H) 10/05/2022 0517   BUN 37 (H) 05/10/2019 1218   CREATININE 1.89 (H) 10/05/2022 0517   CREATININE 1.36 (H) 02/03/2016 1504   CALCIUM 9.4 10/05/2022 0517   GFRNONAA 32  (L) 10/05/2022 0517   GFRAA 46 (L) 11/27/2019 0538    CXR images reviewed, no pneumothorax, chest tube in place  Impression/Plan: Aspiration pneumonia > on zosyn, add mucinex, prn  R pneumothorax > continue to water seal for now, monitor out put and chest x-ray AKI with volume overload > give one dose of lasix 40mg  IV now PEA arrest due to aspiration pneumonitis/hypoxemia> Tele Aspiration risk > npo, SLP evaluation Acute metabolic encephalopaty > minimize sedation, treat underlying infection, mobilize, frequent orientation Macrocytosis> continue B12  Goals of care: palliative care meeting with family, continue goals of care conversation; prognosis guarded and overall it is unlikely that he will make a full recovery given the severity of his acute illness and his advanced age.   My cc time 30 minutes  Roselie Awkward, MD [place] PCCM Pager: [phone] Cell: [phone] After 7pm: [phone]

## 2022-10-05 NOTE — Progress Notes (Signed)
eLink Physician-Brief Progress Note Patient Name: NEWMAN WAREN DOB: 07-Aug-1924 MRN: [id number]   Date of Service  10/05/2022  HPI/Events of Note  Pt intermittently agitated, pulling at lines  eICU Interventions  Renewed restraints        Tafari Humiston Geoffry Paradise CRUZ 10/05/2022, 10:46 PM

## 2022-10-05 NOTE — Progress Notes (Addendum)
Inpatient Diabetes Program Recommendations  AACE/ADA: New Consensus Statement on Inpatient Glycemic Control (2015)  Target Ranges:  Prepandial:   less than 140 mg/dL      Peak postprandial:   less than 180 mg/dL (1-2 hours)      Critically ill patients:  140 - 180 mg/dL   Lab Results  Component Value Date   GLUCAP 88 10/05/2022   HGBA1C 8.3 (H) 09/12/2022    Review of Glycemic Control  Latest Reference Range & Units 10/04/22 15:33 10/04/22 19:55 10/04/22 23:03 10/05/22 03:34 10/05/22 07:52  Glucose-Capillary 70 - 99 mg/dL 123 (H) 101 (H) 85 94 88   Diabetes history: DM  Outpatient Diabetes medications:  Humalog 2 units with supper Lantus 12-16 units q HS Current orders for Inpatient glycemic control:  Semglee 16 units daily Novolog 0-9 units q 4 hours  Inpatient Diabetes Program Recommendations:    Note feeds stopped/cor track pulled out however plan is to replace cor track. Semglee held this AM.  Once tube feeds restarted, consider restarting Novolog tube feed coverage and Semglee 10 units daily.    Thanks,  Adah Perl, RN, BC-ADM Inpatient Diabetes Coordinator Pager [phone]  (8a-5p)

## 2022-10-06 DIAGNOSIS — A0472 Enterocolitis due to Clostridium difficile, not specified as recurrent: Secondary | ICD-10-CM | POA: Diagnosis not present

## 2022-10-06 LAB — CBC WITH DIFFERENTIAL/PLATELET
Abs Immature Granulocytes: 0.07 10*3/uL (ref 0.00–0.07)
Basophils Absolute: 0.1 10*3/uL (ref 0.0–0.1)
Basophils Relative: 0 %
Eosinophils Absolute: 0.2 10*3/uL (ref 0.0–0.5)
Eosinophils Relative: 1 %
HCT: 34.2 % — ABNORMAL LOW (ref 39.0–52.0)
Hemoglobin: 10.7 g/dL — ABNORMAL LOW (ref 13.0–17.0)
Immature Granulocytes: 1 %
Lymphocytes Relative: 12 %
Lymphs Abs: 1.4 10*3/uL (ref 0.7–4.0)
MCH: 31.6 pg (ref 26.0–34.0)
MCHC: 31.3 g/dL (ref 30.0–36.0)
MCV: 100.9 fL — ABNORMAL HIGH (ref 80.0–100.0)
Monocytes Absolute: 0.7 10*3/uL (ref 0.1–1.0)
Monocytes Relative: 6 %
Neutro Abs: 9.6 10*3/uL — ABNORMAL HIGH (ref 1.7–7.7)
Neutrophils Relative %: 80 %
Platelets: 220 10*3/uL (ref 150–400)
RBC: 3.39 MIL/uL — ABNORMAL LOW (ref 4.22–5.81)
RDW: 14.7 % (ref 11.5–15.5)
WBC: 12 10*3/uL — ABNORMAL HIGH (ref 4.0–10.5)
nRBC: 0 % (ref 0.0–0.2)

## 2022-10-06 LAB — COMPREHENSIVE METABOLIC PANEL
ALT: 38 U/L (ref 0–44)
AST: 49 U/L — ABNORMAL HIGH (ref 15–41)
Albumin: 2.1 g/dL — ABNORMAL LOW (ref 3.5–5.0)
Alkaline Phosphatase: 153 U/L — ABNORMAL HIGH (ref 38–126)
Anion gap: 10 (ref 5–15)
BUN: 78 mg/dL — ABNORMAL HIGH (ref 8–23)
CO2: 25 mmol/L (ref 22–32)
Calcium: 9.4 mg/dL (ref 8.9–10.3)
Chloride: 111 mmol/L (ref 98–111)
Creatinine, Ser: 1.96 mg/dL — ABNORMAL HIGH (ref 0.61–1.24)
GFR, Estimated: 31 mL/min — ABNORMAL LOW (ref >60)
Glucose, Bld: 266 mg/dL — ABNORMAL HIGH (ref 70–99)
Potassium: 3.4 mmol/L — ABNORMAL LOW (ref 3.5–5.1)
Sodium: 146 mmol/L — ABNORMAL HIGH (ref 135–145)
Total Bilirubin: 1.4 mg/dL — ABNORMAL HIGH (ref 0.3–1.2)
Total Protein: 4.8 g/dL — ABNORMAL LOW (ref 6.5–8.1)

## 2022-10-06 LAB — GLUCOSE, CAPILLARY
Glucose-Capillary: 235 mg/dL — ABNORMAL HIGH (ref 70–99)
Glucose-Capillary: 206 mg/dL — ABNORMAL HIGH (ref 70–99)
Glucose-Capillary: 227 mg/dL — ABNORMAL HIGH (ref 70–99)
Glucose-Capillary: 161 mg/dL — ABNORMAL HIGH (ref 70–99)
Glucose-Capillary: 84 mg/dL (ref 70–99)
Glucose-Capillary: 94 mg/dL (ref 70–99)

## 2022-10-06 MED ORDER — POTASSIUM CHLORIDE 20 MEQ PO PACK
40.0000 meq | PACK | Freq: Once | ORAL | Status: AC
  Administered 2022-10-06: 40 meq
  Filled 2022-10-06: qty 2

## 2022-10-06 MED ORDER — INSULIN GLARGINE-YFGN 100 UNIT/ML ~~LOC~~ SOLN
6.0000 [IU] | Freq: Once | SUBCUTANEOUS | Status: AC
  Administered 2022-10-06: 6 [IU] via SUBCUTANEOUS
  Filled 2022-10-06: qty 0.06

## 2022-10-06 MED ORDER — INSULIN ASPART 100 UNIT/ML IJ SOLN
0.0000 [IU] | INTRAMUSCULAR | Status: DC
  Administered 2022-10-06: 5 [IU] via SUBCUTANEOUS
  Administered 2022-10-06: 3 [IU] via SUBCUTANEOUS
  Administered 2022-10-07 (×2): 2 [IU] via SUBCUTANEOUS
  Administered 2022-10-08 (×2): 3 [IU] via SUBCUTANEOUS
  Administered 2022-10-08: 5 [IU] via SUBCUTANEOUS
  Administered 2022-10-08 – 2022-10-09 (×3): 3 [IU] via SUBCUTANEOUS
  Administered 2022-10-09: 2 [IU] via SUBCUTANEOUS
  Administered 2022-10-09: 3 [IU] via SUBCUTANEOUS
  Administered 2022-10-10 (×2): 5 [IU] via SUBCUTANEOUS
  Administered 2022-10-10: 3 [IU] via SUBCUTANEOUS
  Administered 2022-10-11: 2 [IU] via SUBCUTANEOUS
  Administered 2022-10-11: 5 [IU] via SUBCUTANEOUS
  Administered 2022-10-11 (×2): 2 [IU] via SUBCUTANEOUS
  Administered 2022-10-11: 3 [IU] via SUBCUTANEOUS
  Administered 2022-10-11: 2 [IU] via SUBCUTANEOUS
  Administered 2022-10-12 – 2022-10-13 (×6): 3 [IU] via SUBCUTANEOUS

## 2022-10-06 MED ORDER — AMLODIPINE BESYLATE 5 MG PO TABS
2.5000 mg | ORAL_TABLET | Freq: Every day | ORAL | Status: DC
  Administered 2022-10-06 – 2022-10-07 (×2): 2.5 mg
  Filled 2022-10-06 (×2): qty 1

## 2022-10-06 MED ORDER — INSULIN ASPART 100 UNIT/ML IJ SOLN
4.0000 [IU] | INTRAMUSCULAR | Status: DC
  Administered 2022-10-06 – 2022-10-08 (×9): 4 [IU] via SUBCUTANEOUS

## 2022-10-06 MED ORDER — FENTANYL CITRATE PF 50 MCG/ML IJ SOSY
12.5000 ug | PREFILLED_SYRINGE | INTRAMUSCULAR | Status: AC | PRN
  Administered 2022-10-06 – 2022-10-08 (×2): 12.5 ug via INTRAVENOUS
  Filled 2022-10-06 (×2): qty 1

## 2022-10-06 MED ORDER — INSULIN GLARGINE-YFGN 100 UNIT/ML ~~LOC~~ SOLN
16.0000 [IU] | Freq: Every day | SUBCUTANEOUS | Status: DC
  Administered 2022-10-07 – 2022-10-13 (×7): 16 [IU] via SUBCUTANEOUS
  Filled 2022-10-06 (×7): qty 0.16

## 2022-10-06 NOTE — Progress Notes (Signed)
Patients daughter, [name], updated on patient status.  Discussed transition to SDU and to [facility].   Plan:  Transfer to SDU, to [facility] as of 10/25 am  PT efforts / mobilize  Follow neuro status, renal function  Cortrak for feeding, advance as patient tolerates with SLP. Aspiration precautions.      Noe Gens, MSN, APRN, NP-C, AGACNP-BC [place] [facility] 10/06/2022, 4:47 PM   Please see [email or web address] for pager details.   From 7A-7P if no response, please call [phone] After hours, please call ELink [phone]

## 2022-10-06 NOTE — Progress Notes (Signed)
Patient transferred to 5W to room 2, without any complication, hearing aids and belongings place at bedside table.

## 2022-10-06 NOTE — Progress Notes (Signed)
Attending:    Subjective: Admitted in setting of c. Diff colitis, aspirated on floor, had PEA arrest, ROSC after 10 minutes Had a traumatic pneumothorax Self extubated on 10/22 Cor-track placed yesterday No acute events overnight Gave lasix yesterday> net negative 2.5 liters overnight   Objective: Vitals:   10/06/22 0800 10/06/22 0803 10/06/22 0900 10/06/22 1000  BP: (!) 140/121  (!) 144/64   Pulse: (!) 59  62 63  Resp: (!) 25  (!) 24 10  Temp:  98 F (36.7 C)    TempSrc:  Axillary    SpO2: 100%  100% 100%  Weight:      Height:          Intake/Output Summary (Last 24 hours) at 10/06/2022 1026 Last data filed at 10/06/2022 1000 Gross per 24 hour  Intake 1497.49 ml  Output 3455 ml  Net -1957.51 ml    General:  Resting comfortably in bed HENT: NCAT OP clear PULM: CTA B, normal effort CV: RRR, no mgr GI: BS+, soft, nontender MSK: normal bulk and tone Neuro: awake, moves all four extremities, garbled speech    CBC    Component Value Date/Time   WBC 12.0 (H) 10/06/2022 0402   RBC 3.39 (L) 10/06/2022 0402   HGB 10.7 (L) 10/06/2022 0402   HGB 11.7 (L) 05/10/2019 1218   HCT 34.2 (L) 10/06/2022 0402   HCT 36.4 (L) 05/10/2019 1218   PLT 220 10/06/2022 0402   PLT 195 05/10/2019 1218   MCV 100.9 (H) 10/06/2022 0402   MCV 97 05/10/2019 1218   MCH 31.6 10/06/2022 0402   MCHC 31.3 10/06/2022 0402   RDW 14.7 10/06/2022 0402   RDW 12.7 05/10/2019 1218   LYMPHSABS 1.4 10/06/2022 0402   MONOABS 0.7 10/06/2022 0402   EOSABS 0.2 10/06/2022 0402   BASOSABS 0.1 10/06/2022 0402    BMET    Component Value Date/Time   NA 146 (H) 10/06/2022 0402   NA 142 05/10/2019 1218   K 3.4 (L) 10/06/2022 0402   CL 111 10/06/2022 0402   CO2 25 10/06/2022 0402   GLUCOSE 266 (H) 10/06/2022 0402   BUN 78 (H) 10/06/2022 0402   BUN 37 (H) 05/10/2019 1218   CREATININE 1.96 (H) 10/06/2022 0402   CREATININE 1.36 (H) 02/03/2016 1504   CALCIUM 9.4 10/06/2022 0402   GFRNONAA 31 (L)  10/06/2022 0402   GFRAA 46 (L) 11/27/2019 1287      Impression/Plan: Aspiration pneumonia> zosyn through today C. Diff > continue dificd through 10/28 Pneumothorax> small air leak noted today, overall seems stable and suspect we should be able to d/c in next 24 hours Acute encephalopathy likely due to anoxic brain injury vs metabolic encephalopathy from pneumonia> minimize sedation, frequent orientation Transaminases elevated slightly due to lipitor, repeat in AM, if Alk phos still high then RUQ ultrasound Hypokalemia> replace Physical deconditioining > PT Hyperglycemia> SSI NAGMA resolved>  d/c sodium bicarb   My cc time 32 minutes  Roselie Awkward, MD [place] PCCM Pager: [phone] Cell: [phone] After 7pm: [phone]

## 2022-10-06 NOTE — Progress Notes (Signed)
NAME:  Daniel Cooke, MRN:  [id number], DOB:  1924-04-08, LOS: 11 ADMISSION DATE:  09/25/2022, CONSULTATION DATE:  09/28/22 REFERRING MD:  Code team, CHIEF COMPLAINT:  Arrest   History of Present Illness:  88 y/o M with hx of CKD, HLD, DM2 but otherwise fully functional who presented with recurrent colitis found to have C difficile infection.  Hospital course complicated by delirium.  Unfortunately he had what seems to be a aspiration event then had cardiac arrest.  ROSC within 10 mins.  Overbreathing vent.  PCCM consulted to assist with post-CPR management.  Intubated and not following commands initially. More alert 10/22 - moving spontaneously, followed commands on LUE.   Pertinent  Medical History  Arthritis  HTN  PAF - CHADS2VASC 4, on eliquis  Chronic Diastolic CHF - [id number] EF [id number] DM II  Shingles  HOH - wears hearing aides Osteoarthritis   Significant Hospital Events: Including procedures, antibiotic start and stop dates in addition to other pertinent events   10/13 admit with C. Diff 10/16 cardiac arrest 10/17 pneumothorax> chest tube placed. EEG with burst suppression  10/20 SBT but mental status precludes extubation. EEG/MRI negative.  10/21 Eyes open, squeezed left hand to command 10/22 Self Extubated, mildly agitated, on 2 L [place], required NTS for secretion management 10/23 Cortrack placed for feedings  Interim History / Subjective:  RN reports no acute events, CT to water seal  Tmax 99.4 / WBC 12.0 I/O 2.35 L UOP, -2.17L in last 24 hours  Chest tube -227ml in last 24 hours Glucose range 88-240   Objective   Blood pressure (!) 144/64, pulse 63, temperature 98 F (36.7 C), temperature source Axillary, resp. rate 10, height 5\' 10"  (1.778 m), weight 90.3 kg, SpO2 100 %.        Intake/Output Summary (Last 24 hours) at 10/06/2022 1111 Last data filed at 10/06/2022 1000 Gross per 24 hour  Intake 1463.21 ml  Output 3295 ml  Net -1831.79 ml   Filed Weights    09/28/22 1600 10/06/22 0500  Weight: 79 kg 90.3 kg    Examination: General: elderly adult male lying in bed, in NAD  HEENT: MM pink/moist, cortrak in place, anicteric, pupils equal, HOH Neuro: eyes open, states "good morning" in response to same, follows commands, mumbles frequently but not comprehensible CV: s1s2 irr irr, AF on monitor, no m/r/g PULM: non-labored at rest, lungs bilaterally diminished, right chest tube in place with water seal, one witnessed bubble in [place] on exam - not consistent  GI: soft, bsx4 active  Extremities: warm/dry, 2+ BLE edema  Skin: no rashes or lesions  Resolved Hospital Problem list   Non-anion Gap Metabolic Acidosis  Assessment & Plan:   S/p PEA Arrest in the setting of Hypoxia d/t Aspiration Post CPR Demand Ischemia Persistent Atrial Fibrillation HTN ROSC achieved after 10 minutes.  EEG negative for acute seizures. MRI with no acute intracranial abnormalities. ECHO with preserved EF.  -tele monitoring  -continue eliquis -resume home amlodipine   Acute Respiratory Failure d/t Aspiration Pneumonia Traumatic Right sided Pneumothorax from CPR Left Lower Lobe Pneumonia -wean O2 for sats >90% -chest tube to water seal -chest tube care per protocol -zosyn, stop date 10/24 -follow up CXR in am  -aspiration precautions, NPO  -SLP evaluation pending   C. Difficile Colitis -flexi seal  -enteric precautions  -fidaxomicin   Acute Metabolic Encephalopathy Post arrest.  MRI negative but concern for some degree of anoxic brain injury. EEG negative.  -follow clinical exam  -  ensure hearing aides are in place -promote sleep / wake cycle to limit delirum -avoid sedating meds  -PT efforts  DM II -BG goal 140-180 -glargine to 16 units QD  -SSI, moderate scale  Macrocytic Anemia -trend CBC -folate, B12 PT  -transfuse for Hgb <7%  AKI CKD stage 3b Hypokalemia NAGMA, resolved -Trend BMP / urinary output -Replace electrolytes as  indicated -Avoid nephrotoxic agents, ensure adequate renal perfusion -hold lasix with cr increase  -stop bicarb   Protein-Calorie Malnutrition -TF per Nutrition   Liver Shock Post-arrest -follow LFT's -noted rise 10/24, may be lipitor  -follow up in am, if continue to rise, consider RUQ [place]  Best Practice (right click and "Reselect all SmartList Selections" daily)  Diet/type: tubefeeds DVT prophylaxis: DOAC GI prophylaxis: PPI Lines: Central line Foley:  N/A Code Status:  full code Last date of multidisciplinary goals of care discussion: 10/22.     Critical care time:    Noe Gens, MSN, APRN, NP-C, AGACNP-BC [facility] 10/06/2022, 11:25 AM   Please see [email or web address] for pager details.   From 7A-7P if no response, please call [phone] After hours, please call ELink [phone]

## 2022-10-06 NOTE — Progress Notes (Signed)
Pharmacy ICU Insulin Management  CBGs above goal 140-180 mg/dL: Yes  Current regimen (include units of SSI in last 24hr):  sSSI q4h (23u/24h) Semglee 10u QAM   Medications affecting CBG levels: tube feeds  Plan: -Increased semglee 16u (10u already given this AM ordered an additional 6u x1 for today) -Increased to 4u q4h for tube feeding coverage -intensified to [place], PharmD Clinical Pharmacist 10/06/2022 10:42 AM

## 2022-10-06 NOTE — Progress Notes (Cosign Needed)
NAME:  Daniel Cooke, MRN:  [id number], DOB:  09-02-1924, LOS: 44 ADMISSION DATE:  09/25/2022, CONSULTATION DATE:  09/28/22 REFERRING MD:  Code team, CHIEF COMPLAINT:  arrest   History of Present Illness:  88 year old man with hx of CKD, HLD, DM2 but otherwise fully functional who presented with recurrent colitis found to have C difficile infection.  Hospital course complicated by delirium.  Unfortunately he had what seems to be a aspiration event then had cardiac arrest.  ROSC within 10 mins.  Overbreathing vent.  PCCM consulted to assist with post-CPR management.  Intubated and not following commands initially. More alert 10/22 - moving spontaneously, followed commands on LUE.   Pertinent  Medical History   Past Medical History:  Diagnosis Date   Arthritis    "was in left knee before replacement; now have it in my right knee" (11/25/2016)   BPH (benign prostatic hypertrophy)    Chronic diastolic CHF (congestive heart failure) ([place])    a. 02/2016 Echo: EF 60-65%, no rwma, triv AI, mild MR, mildly to mod dil LA, mod dil RA, PASP 77mmHg.   Dysrhythmia    Essential hypertension    Hard of hearing    wears bilateral hearing aids   Hypercholesterolemia    Osteoarthritis    PAF (paroxysmal atrial fibrillation) ([place])    a. CHADS2VASC score of 4 --> coumadin.   Pilonidal cyst    PAST HX - NO PROBLEM NOW   Pneumonia    "one time; years ago" (11/25/2016)   Shingles    "long time ago"   Synovitis of knee 02/27/2014   Type II diabetes mellitus ([place])    oral meds - no insulin    Significant Hospital Events: Including procedures, antibiotic start and stop dates in addition to other pertinent events   10/13 admit with C. Diff. 10/16 cardiac arrest 10/17 pneumothorax> chest tube placed. EEG with burst suppression.  10/20 SBT but mental status precludes extubation. EEG/MRI negative.  10/21 Eyes open, squeezed left hand to command 10/22 Self Extubated, mildly agitated, on 2 L , required  NTS for secretion management 10/23 Cortrack placed for feedings  Interim History / Subjective:  Tmax 99.4 / WBC 12.0 I/O 2.35 L UOP, -2.17L in last 24 hours  Chest tube -233ml in last 24 hours Glucose range 88-240   Objective   Blood pressure (!) 136/59, pulse 77, temperature 98.1 F (36.7 C), temperature source Axillary, resp. rate (!) 25, height 5\' 10"  (1.778 m), weight 90.3 kg, SpO2 100 %.        Intake/Output Summary (Last 24 hours) at 10/06/2022 0740 Last data filed at 10/06/2022 0600 Gross per 24 hour  Intake 1234.81 ml  Output 3405 ml  Net -2170.19 ml   Filed Weights   09/28/22 1600 10/06/22 0500  Weight: 79 kg 90.3 kg    Examination: General: chronically ill appearing elderly male, laying in bed in NAD HEENT: Moist membranes, O2 via , anicteric, PERRL, Cortrak in place Neuro: opens eyes to voice, wiggles toes to command, mumbling a phrase but not comprehensible, squeezes left hand CV: IRIR S1S2, afib on monitor, no murmur, rubs, or gallops PULM: non-labored breathing, bilateral diminished breath sounds, Right chest tube in place to water seal GI: normoactive bowel sounds x4, distended abdomen, non-tender, rectal tube in place Extremities: warm and dry, +2 edema in BLE Skin: no rashes or lesions  Resolved Hospital Problem list   Non-anion Gap Metabolic Acidosis  Assessment & Plan:   S/p PEA  Arrest in the setting of Hypoxia d/t Aspiration Post CPR Demand Ischemia Persistent Atrial Fibrillation ROSC achieved after 10 minutes.  EEG negative for acute seizures. MRI with no acute intracranial abnormalities. ECHO with preserved EF.  -Follow neuro exam -Telemetry monitoring -Continue Eliquis per tube -Ensure hearing aides in place for exam -Restart home amlodipine  Acute Respiratory Failure d/t Aspiration Pneumonia Traumatic Right sided Pneumothorax from CPR Left Lower Lobe Pneumonia -wean O2 for sats > 90% -chest tube to water seal -Continue Zosyn, stop  date 10/24 -Continue Guaifenesin  -Follow CXR intermittently -Aspiration precautions -NPO -SLP to follow  C. Difficile Colitis -Continue Fidaxomicin -Enteric precautions -Flexiseal  Acute Metabolic Encephalopathy Post arrest.  MRI negative but concern for some degree of anoxic brain injury. EEG negative.  -Avoid sedating medications -Monitor for pain -Delirium precautions -PT/OT when able  DM II -BG goal 140-180 -Continue TF coverage -Continue Glargine 10 units daily -SSI, sensitive scale  Macrocytic Anemia -Continue B12 and folate -Daily CBC -Transfuse for Hgb <7  AKI CKD stage 3b Hypokalemia NAGMA, resolved -Daily BMP -Strict I&O -Holding home Lasix -Received 1 dose IV 40 mg lasix on 10/23, slightly worsened creatinine on 10/24 -Consider stopping sodium bicarbonate -Trend/Replace electrolytes -Avoid nephrotoxins  Protein-Calorie Malnutrition -Continue TF  Liver Shock Post-arrest Improving -Trend LFT's  Best Practice (right click and "Reselect all SmartList Selections" daily)  Diet/type: tubefeeds DVT prophylaxis: DOAC GI prophylaxis: PPI Lines: Central line Foley:  N/A Code Status:  full code Last date of multidisciplinary goals of care discussion: 10/22.  No family present at bedside today on rounds. Will update as available. Critical care time:

## 2022-10-06 NOTE — Progress Notes (Signed)
   10/06/22 1941  Assess: MEWS Score  Temp 98.4 F (36.9 C)  BP (!) 154/71  MAP (mmHg) 95  Pulse Rate 84  ECG Heart Rate 88  Resp (!) 23  SpO2 99 %  Assess: MEWS Score  MEWS Temp 0  MEWS Systolic 0  MEWS Pulse 0  MEWS RR 1  MEWS LOC 2  MEWS Score 3  MEWS Score Color Yellow  Assess: if the MEWS score is Yellow or Red  Were vital signs taken at a resting state? Yes  Focused Assessment No change from prior assessment  Does the patient meet 2 or more of the SIRS criteria? Yes  Does the patient have a confirmed or suspected source of infection? Yes  Provider and Rapid Response Notified? No  MEWS guidelines implemented *See Row Information* No, previously red, continue vital signs every 4 hours  Treat  Pain Scale CPOT  Pain Score 0  Notify: Charge Nurse/RN  Name of Charge Nurse/RN Notified Matt, RN  Date Charge Nurse/RN Notified 10/06/22  Time Charge Nurse/RN Notified 2237  Document  Patient Outcome Other (Comment) (no change in pt's previous condition. Pt remains on the unit.)  Progress note created (see row info) Yes  Assess: SIRS CRITERIA  SIRS Temperature  0  SIRS Pulse 0  SIRS Respirations  1  SIRS WBC 1  SIRS Score Sum  2

## 2022-10-06 NOTE — Progress Notes (Signed)
eLink Physician-Brief Progress Note Patient Name: Daniel Cooke DOB: [birth year] MRN: [id number]   Date of Service  10/06/2022  HPI/Events of Note  Pt in pain, crying out. Given PRN tylenol, without much effect.   eICU Interventions  Placed order for low dose fentanyl PRN.  Will follow response.  Pt reported to have nausea/vomiting/AMS with narcotics.         Markeisha Mancias M DELA CRUZ 10/06/2022, 2:38 AM

## 2022-10-06 NOTE — Progress Notes (Signed)
[facility] ADULT ICU REPLACEMENT PROTOCOL   The patient does apply for the [facility] Adult ICU Electrolyte Replacment Protocol based on the criteria listed below:   1.Exclusion criteria: TCTS patients, ECMO patients, and Dialysis patients 2. Is GFR >/= 30 ml/min? Yes.    Patient's GFR today is 31 3. Is SCr </= 2? Yes.   Patient's SCr is 1.96 mg/dL 4. Did SCr increase >/= 0.5 in 24 hours? No. 5.Pt's weight >40kg  Yes.   6. Abnormal electrolyte(s): K+ 3.4  7. Electrolytes replaced per protocol 8.  Call MD STAT for K+ </= 2.5, Phos </= 1, or Mag </= 1 Physician:  n/a  Daniel Cooke 10/06/2022 5:22 AM

## 2022-10-06 NOTE — Progress Notes (Incomplete Revision)
NAME:  Daniel Cooke, MRN:  [id number], DOB:  1924/08/30, LOS: 43 ADMISSION DATE:  09/25/2022, CONSULTATION DATE:  09/28/22 REFERRING MD:  Code team, CHIEF COMPLAINT:  arrest   History of Present Illness:  88 year old man with hx of CKD, HLD, DM2 but otherwise fully functional who presented with recurrent colitis found to have C difficile infection.  Hospital course complicated by delirium.  Unfortunately he had what seems to be a aspiration event then had cardiac arrest.  ROSC within 10 mins.  Overbreathing vent.  PCCM consulted to assist with post-CPR management.  Intubated and not following commands initially. More alert 10/22 - moving spontaneously, followed commands on LUE.   Pertinent  Medical History   Past Medical History:  Diagnosis Date   Arthritis    "was in left knee before replacement; now have it in my right knee" (11/25/2016)   BPH (benign prostatic hypertrophy)    Chronic diastolic CHF (congestive heart failure) ([place])    a. 02/2016 Echo: EF 60-65%, no rwma, triv AI, mild MR, mildly to mod dil LA, mod dil RA, PASP 45mmHg.   Dysrhythmia    Essential hypertension    Hard of hearing    wears bilateral hearing aids   Hypercholesterolemia    Osteoarthritis    PAF (paroxysmal atrial fibrillation) ([place])    a. CHADS2VASC score of 4 --> coumadin.   Pilonidal cyst    PAST HX - NO PROBLEM NOW   Pneumonia    "one time; years ago" (11/25/2016)   Shingles    "long time ago"   Synovitis of knee 02/27/2014   Type II diabetes mellitus ([place])    oral meds - no insulin    Significant Hospital Events: Including procedures, antibiotic start and stop dates in addition to other pertinent events   10/13 admit with C. Diff. 10/16 cardiac arrest 10/17 pneumothorax> chest tube placed. EEG with burst suppression.  10/20 SBT but mental status precludes extubation. EEG/MRI negative.  10/21 Eyes open, squeezed left hand to command 10/22 Self Extubated, mildly agitated, on 2 L [place], required  NTS for secretion management 10/23 Cortrack placed for feedings  Interim History / Subjective:  Tmax 99.4 / WBC 12.0 I/O 2.35 L UOP, -2.17L in last 24 hours  Chest tube -254ml in last 24 hours Glucose range 88-240   Objective   Blood pressure (!) 136/59, pulse 77, temperature 98.1 F (36.7 C), temperature source Axillary, resp. rate (!) 25, height 5\' 10"  (1.778 m), weight 90.3 kg, SpO2 100 %.        Intake/Output Summary (Last 24 hours) at 10/06/2022 0740 Last data filed at 10/06/2022 0600 Gross per 24 hour  Intake 1234.81 ml  Output 3405 ml  Net -2170.19 ml   Filed Weights   09/28/22 1600 10/06/22 0500  Weight: 79 kg 90.3 kg    Examination: General: chronically ill appearing elderly male, laying in bed in NAD HEENT: Moist membranes, O2 via [place] 1.5L, anicteric, PERRL, Cortrak in place Neuro: opens eyes to voice, wiggles toes to command, mumbling a phrase but not comprehensible, squeezes left hand CV: IRIR S1S2, afib on monitor, no murmur, rubs, or gallops PULM: non-labored breathing, bilateral diminished breath sounds, Right chest tube in place to water seal GI: normoactive bowel sounds x4, distended abdomen, non-tender, rectal tube in place Extremities: warm and dry, +2 edema in BLE Skin: no rashes or lesions  Resolved Hospital Problem list   Non-anion Gap Metabolic Acidosis  Assessment & Plan:   S/p  PEA Arrest in the setting of Hypoxia d/t Aspiration Post CPR Demand Ischemia Persistent Atrial Fibrillation ROSC achieved after 10 minutes.  EEG negative for acute seizures. MRI with no acute intracranial abnormalities. ECHO with preserved EF.  -Follow neuro exam -Telemetry monitoring -Continue Eliquis per tube -Ensure hearing aides in place for exam -Restart home amlodipine  Acute Respiratory Failure d/t Aspiration Pneumonia Traumatic Right sided Pneumothorax from CPR Left Lower Lobe Pneumonia -wean O2 for sats > 90% -chest tube to water seal -Continue Zosyn,  stop date 10/24 -Continue Guaifenesin  -Follow CXR intermittently -Aspiration precautions -NPO -SLP to follow  C. Difficile Colitis -Continue Fidaxomicin -Enteric precautions -Flexiseal  Acute Metabolic Encephalopathy Post arrest.  MRI negative but concern for some degree of anoxic brain injury. EEG negative.  -Avoid sedating medications -Monitor for pain -Delirium precautions -PT/OT when able  DM II -BG goal 140-180 -Continue TF coverage -Continue Glargine 10 units daily -SSI, sensitive scale  Macrocytic Anemia -Continue B12 and folate -Daily CBC -Transfuse for Hgb <7  AKI CKD stage 3b Hypokalemia NAGMA, resolved -Daily BMP -Strict I&O -Holding home Lasix -Received 1 dose IV 40 mg lasix on 10/23, slightly worsened creatinine on 10/24 -Consider stopping sodium bicarbonate -Trend/Replace electrolytes -Avoid nephrotoxins  Protein-Calorie Malnutrition -Continue TF  Liver Shock Post-Arrest-resolved Transaminitis -Trend LFT's -Improved initially but worsened today   Best Practice (right click and "Reselect all SmartList Selections" daily)  Diet/type: tubefeeds DVT prophylaxis: DOAC GI prophylaxis: PPI Lines: Central line Foley:  N/A Code Status:  full code Last date of multidisciplinary goals of care discussion: 10/22.  No family present at bedside today on rounds. Will update as available. Critical care time:

## 2022-10-07 ENCOUNTER — Inpatient Hospital Stay (HOSPITAL_COMMUNITY): Payer: Medicare Other

## 2022-10-07 DIAGNOSIS — I4819 Other persistent atrial fibrillation: Secondary | ICD-10-CM

## 2022-10-07 DIAGNOSIS — G9341 Metabolic encephalopathy: Secondary | ICD-10-CM | POA: Diagnosis not present

## 2022-10-07 DIAGNOSIS — Z515 Encounter for palliative care: Secondary | ICD-10-CM | POA: Diagnosis not present

## 2022-10-07 DIAGNOSIS — R627 Adult failure to thrive: Secondary | ICD-10-CM | POA: Diagnosis not present

## 2022-10-07 DIAGNOSIS — A0472 Enterocolitis due to Clostridium difficile, not specified as recurrent: Secondary | ICD-10-CM | POA: Diagnosis not present

## 2022-10-07 DIAGNOSIS — N289 Disorder of kidney and ureter, unspecified: Secondary | ICD-10-CM | POA: Diagnosis not present

## 2022-10-07 DIAGNOSIS — S270XXA Traumatic pneumothorax, initial encounter: Secondary | ICD-10-CM

## 2022-10-07 LAB — BASIC METABOLIC PANEL
Anion gap: 10 (ref 5–15)
BUN: 73 mg/dL — ABNORMAL HIGH (ref 8–23)
CO2: 23 mmol/L (ref 22–32)
Calcium: 9.5 mg/dL (ref 8.9–10.3)
Chloride: 117 mmol/L — ABNORMAL HIGH (ref 98–111)
Creatinine, Ser: 1.82 mg/dL — ABNORMAL HIGH (ref 0.61–1.24)
GFR, Estimated: 34 mL/min — ABNORMAL LOW (ref >60)
Glucose, Bld: 150 mg/dL — ABNORMAL HIGH (ref 70–99)
Potassium: 4.1 mmol/L (ref 3.5–5.1)
Sodium: 150 mmol/L — ABNORMAL HIGH (ref 135–145)

## 2022-10-07 LAB — CBC
HCT: 36 % — ABNORMAL LOW (ref 39.0–52.0)
Hemoglobin: 11.2 g/dL — ABNORMAL LOW (ref 13.0–17.0)
MCH: 31.8 pg (ref 26.0–34.0)
MCHC: 31.1 g/dL (ref 30.0–36.0)
MCV: 102.3 fL — ABNORMAL HIGH (ref 80.0–100.0)
Platelets: 228 10*3/uL (ref 150–400)
RBC: 3.52 MIL/uL — ABNORMAL LOW (ref 4.22–5.81)
RDW: 15 % (ref 11.5–15.5)
WBC: 12.5 10*3/uL — ABNORMAL HIGH (ref 4.0–10.5)
nRBC: 0 % (ref 0.0–0.2)

## 2022-10-07 LAB — GLUCOSE, CAPILLARY
Glucose-Capillary: 118 mg/dL — ABNORMAL HIGH (ref 70–99)
Glucose-Capillary: 128 mg/dL — ABNORMAL HIGH (ref 70–99)
Glucose-Capillary: 136 mg/dL — ABNORMAL HIGH (ref 70–99)
Glucose-Capillary: 104 mg/dL — ABNORMAL HIGH (ref 70–99)
Glucose-Capillary: 134 mg/dL — ABNORMAL HIGH (ref 70–99)
Glucose-Capillary: 201 mg/dL — ABNORMAL HIGH (ref 70–99)

## 2022-10-07 LAB — PROCALCITONIN: Procalcitonin: 0.48 ng/mL

## 2022-10-07 LAB — MAGNESIUM: Magnesium: 2.3 mg/dL (ref 1.7–2.4)

## 2022-10-07 MED ORDER — DEXTROSE 5 % IV SOLN: INTRAVENOUS | Status: DC

## 2022-10-07 MED ORDER — OSMOLITE 1.5 CAL PO LIQD
1000.0000 mL | ORAL | Status: DC
  Filled 2022-10-07 (×3): qty 1000

## 2022-10-07 MED ORDER — AMLODIPINE BESYLATE 5 MG PO TABS
5.0000 mg | ORAL_TABLET | Freq: Once | ORAL | Status: AC
  Administered 2022-10-07: 5 mg via NASOGASTRIC
  Filled 2022-10-07: qty 1

## 2022-10-07 MED ORDER — METOPROLOL TARTRATE 25 MG PO TABS
25.0000 mg | ORAL_TABLET | Freq: Two times a day (BID) | ORAL | Status: DC
  Administered 2022-10-07: 25 mg via NASOGASTRIC
  Filled 2022-10-07: qty 1

## 2022-10-07 MED ORDER — PROSOURCE TF20 ENFIT COMPATIBL EN LIQD
45.0000 mL | Freq: Every day | ENTERAL | Status: DC
  Administered 2022-10-08 – 2022-10-13 (×6): 45 mL
  Filled 2022-10-07 (×6): qty 60

## 2022-10-07 MED ORDER — AMLODIPINE BESYLATE 10 MG PO TABS
10.0000 mg | ORAL_TABLET | Freq: Every day | ORAL | Status: DC
  Administered 2022-10-08 – 2022-10-13 (×6): 10 mg
  Filled 2022-10-07 (×6): qty 1

## 2022-10-07 MED ORDER — INSULIN GLARGINE-YFGN 100 UNIT/ML ~~LOC~~ SOLN
5.0000 [IU] | Freq: Once | SUBCUTANEOUS | Status: AC
  Administered 2022-10-07: 5 [IU] via SUBCUTANEOUS
  Filled 2022-10-07: qty 0.05

## 2022-10-07 MED ORDER — FREE WATER
250.0000 mL | Freq: Four times a day (QID) | Status: DC
  Administered 2022-10-07 – 2022-10-09 (×8): 250 mL

## 2022-10-07 NOTE — Progress Notes (Addendum)
PROGRESS NOTE                                                                                                                                                                                                             Patient Demographics:    Daniel Cooke, is a 88 y.o. male, DOB - [birth year], SPQ:[id number]  Outpatient Primary MD for the patient is Glenis Smoker, MD    LOS - 12  Admit date - 09/25/2022    Chief Complaint  Patient presents with   Back Pain       Brief Narrative (HPI from H&P)   88 y/o M with hx of CKD 3B, HLD, DM2, Afib, HTN,  but otherwise fully functional who presented with recurrent colitis found to have C difficile infection.  Hospital course complicated by delirium.  Unfortunately he had what seems to be a aspiration event then had cardiac arrest.  He was admitted on 09/25/2022 and intubated for aspiration pneumonia related complications and cardiac arrest on 09/28/2022, he self extubated himself on 10/04/2022.  He also developed pneumothorax in ICU requiring a right-sided chest tube placement on 09/29/2022 which he still has, for his ongoing dysphagia and aspiration he has a core track tube, currently encephalopathic and restrained to the bed, overall prognosis poor, transferred to my service on 10/07/2022 on day 12 of his hospital stay.    Procedures  :     10/13 admit with C. Diff 10/16 cardiac arrest 10/16 echocardiogram as below 10/17 pneumothorax> chest tube placed. EEG with burst suppression  10/20 SBT but mental status precludes extubation. EEG/MRI negative.  10/21 Eyes open, squeezed left hand to command 10/22 Self Extubated, mildly agitated, on 2 L [place], required NTS for secretion management 10/23 Cortrack placed for feedings 10/07/2022 transferred to hospitalist service    TTE -  1. Left ventricular ejection fraction, by estimation, is 65 to 70%. The left ventricle has normal  function. The left ventricle has no regional wall motion abnormalities. Left ventricular diastolic parameters are indeterminate. There is the  interventricular septum is flattened in systole, consistent with right ventricular pressure overload.   2. Right ventricular systolic function is mildly reduced. The right ventricular size is moderately enlarged. There is moderately elevated pulmonary artery systolic pressure. The estimated right ventricular systolic pressure is 33.3 mmHg.   3. Right atrial  size was severely dilated.   4. The mitral valve is grossly normal. Mild mitral valve regurgitation.   5. Tricuspid valve regurgitation is mild to moderate.   6. The aortic valve is tricuspid. There is severe calcifcation of the aortic valve. There is severe thickening of the aortic valve. Aortic valve regurgitation is not visualized. Aortic valve sclerosis/calcification is present, without any evidence of  aortic stenosis.   7. Aortic dilatation noted. There is borderline dilatation of the aortic root, measuring 37 mm.   8. The inferior vena cava is dilated in size with <50% respiratory variability, suggesting right atrial pressure of 15 mmHg.   Subjective:    [name] today in bed restrained to bed confused unable to answer questions or follow commands   Assessment  & Plan :   Recurrent C. difficile colitis.  On Dificid, clinically improving will monitor, currently has Flexi-Seal.  Await stool to get formed although with tube feeds might be difficult to assess.  Aspiration pneumonia causing PEA arrest, acute hypoxic respiratory failure requiring endotracheal intubation followed by self extubation.  Currently on supplemental oxygen, finished his antibiotics for aspiration pneumonia on 10/06/2022.  Currently on core track, once mental status improves will have speech therapy reassess.  Echocardiogram gram done on the day of cardiac arrest showed a preserved EF around 65% without any wall motion  abnormalities or acute changes.  Right-sided pneumothorax.  Chest tube being managed by PCCM.  Acute metabolic and toxic encephalopathy.  Due to #1 and 2 above.  Supportive care.  Currently requiring restraints to prevent self-harm.  Minimize narcotics and benzodiazepines.  Shock liver post PEA arrest.  Trend stable will monitor.  Mild elevation in alk phos could be due to tube feeds.  Moderate protein calorie malnutrition.  Protein supplementation.  Dehydration with hypernatremia.  Add free water flushes to tube feeds via core track, IV D5W x 1 day on 10/07/2022.  Hypertension.  In poor control added Norvasc and beta-blocker( if tolerated by H rate) .  Monitor and adjust.  Paroxysmal Afib - in ICU and now with aberrant conduction, TTE stable, [place] VASC 2 will be > 2 at least, On Eliquis, some episodes of S. Loletha Grayer ? Aberrant conduction, hold further B Blocker, will get Cards input, likely no further changes needed.  Anemia.  Mild intermittent gross cytosis, monitor.  AKI on CKD 3B.  Baseline creatinine around 1.5.  Hydrate and monitor.  Urinary Retention - foley 10/07/22, Flomax once taking by mouth.  DM type II.  On Lantus and sliding scale.  Monitor and adjust.  Lab Results  Component Value Date   HGBA1C 8.3 (H) 09/12/2022   CBG (last 3)  Recent Labs    10/06/22 2314 10/07/22 0310 10/07/22 0748  GLUCAP 94 118* 128*         Condition - Extremely Guarded  Family Communication  : Daughter over the phone on 10/07/2022  Code Status : Full code  Consults  : PCCM, palliative care  PUD Prophylaxis :        Disposition Plan  :    Status is: Inpatient  DVT Prophylaxis  :    apixaban (ELIQUIS) tablet 2.5 mg Start: 09/30/22 1000 apixaban (ELIQUIS) tablet 2.5 mg    Lab Results  Component Value Date   PLT 228 10/07/2022    Diet :  Diet Order             Diet NPO time specified  Diet effective now  Inpatient Medications  Scheduled  Meds:  [START ON 10/08/2022] amLODipine  10 mg Per Tube Daily   apixaban  2.5 mg Per Tube BID   bethanechol  5 mg Per Tube TID   Chlorhexidine Gluconate Cloth  6 each Topical Daily   cyanocobalamin  1,000 mcg Per Tube Daily   famotidine  10.4 mg Per Tube QODAY   [START ON 10/08/2022] feeding supplement (PROSource TF20)  45 mL Per Tube Daily   fidaxomicin  200 mg Per NG tube BID   folic acid  1 mg Per Tube Daily   free water  250 mL Per Tube Q6H   guaiFENesin  5 mL Per Tube Q12H   insulin aspart  0-15 Units Subcutaneous Q4H   insulin aspart  4 Units Subcutaneous Q4H   insulin glargine-yfgn  16 Units Subcutaneous Daily   insulin glargine-yfgn  5 Units Subcutaneous Once   metoprolol tartrate  25 mg Per NG tube BID   mouth rinse  15 mL Mouth Rinse Q2H   sodium chloride flush  10 mL Intrapleural Q8H   Continuous Infusions:  sodium chloride Stopped (10/05/22 1236)   dextrose     feeding supplement (OSMOLITE 1.5 CAL) 1,000 mL (10/06/22 2036)   PRN Meds:.sodium chloride, acetaminophen **OR** acetaminophen, albuterol, fentaNYL (SUBLIMAZE) injection, mouth rinse, polyvinyl alcohol   Objective:   Vitals:   10/06/22 2315 10/07/22 0307 10/07/22 0500 10/07/22 0739  BP: (!) 144/71 (!) 151/68  (!) 156/92  Pulse: 76 85  82  Resp: (!) 23 (!) 23  20  Temp: 98.5 F (36.9 C) 98.4 F (36.9 C)  98 F (36.7 C)  TempSrc: Oral Oral  Axillary  SpO2: 98% 99%    Weight:   90.6 kg   Height:        Wt Readings from Last 3 Encounters:  10/07/22 90.6 kg  07/15/21 81.2 kg  05/20/21 81.6 kg     Intake/Output Summary (Last 24 hours) at 10/07/2022 1045 Last data filed at 10/07/2022 0600 Gross per 24 hour  Intake 690.7 ml  Output 1815 ml  Net -1124.3 ml     Physical Exam  Awake confused and agitated, restrained to the bed, core track in place, rectal tube in place, right-sided chest tube in place, moves all 4 extremities [place].AT,PERRAL Supple Neck, No JVD,   Symmetrical Chest wall movement,  Good air movement bilaterally, CTAB RRR,No Gallops,Rubs or new Murmurs,  +ve B.Sounds, Abd Soft, No tenderness,   No Cyanosis, Clubbing or edema     RN pressure injury documentation: Pressure Injury 09/30/22 Coccyx Mid Stage 2 -  Partial thickness loss of dermis presenting as a shallow open injury with a red, pink wound bed without slough. UTA (Active)  09/30/22 2000  Location: Coccyx  Location Orientation: Mid  Staging: Stage 2 -  Partial thickness loss of dermis presenting as a shallow open injury with a red, pink wound bed without slough.  Wound Description (Comments): UTA  Present on Admission:   Dressing Type Foam - Lift dressing to assess site every shift 10/06/22 1957      Data Review:    CBC Recent Labs  Lab 10/03/22 0403 10/04/22 0536 10/05/22 0517 10/06/22 0402 10/07/22 0842  WBC 14.7* 12.7* 14.6* 12.0* 12.5*  HGB 11.4* 10.4* 10.8* 10.7* 11.2*  HCT 34.9* 32.4* 34.2* 34.2* 36.0*  PLT 232 204 221 220 228  MCV 100.0 99.7 100.6* 100.9* 102.3*  MCH 32.7 32.0 31.8 31.6 31.8  MCHC 32.7 32.1 31.6 31.3 31.1  RDW 15.1 14.8 14.9 14.7 15.0  LYMPHSABS  --   --   --  1.4  --   MONOABS  --   --   --  0.7  --   EOSABS  --   --   --  0.2  --   BASOSABS  --   --   --  0.1  --     Electrolytes Recent Labs  Lab 10/01/22 0429 10/02/22 0223 10/03/22 0403 10/04/22 0536 10/05/22 0517 10/06/22 0402 10/07/22 0842  NA 147* 148* 144 143 146* 146* 150*  K 3.8 3.6 3.6 3.1* 3.6 3.4* 4.1  CL 115* 115* 111 109 111 111 117*  CO2 22 25 24 25 24 25 23   GLUCOSE 221* 222* 296* 128* 104* 266* 150*  BUN 74* 72* 74* 74* 74* 78* 73*  CREATININE 2.24* 2.07* 1.95* 1.89* 1.89* 1.96* 1.82*  CALCIUM 9.3 9.2 9.0 8.6* 9.4 9.4 9.5  AST 26 20 23 31   --  49*  --   ALT 36 28 26 26   --  38  --   ALKPHOS 85 77 92 88  --  153*  --   BILITOT 1.0 1.1 1.4* 1.2  --  1.4*  --   ALBUMIN 2.3* 2.1* 2.2* 1.9*  --  2.1*  --   MG 2.4 2.2 2.1  --   --   --  2.3    Radiology Reports DG CHEST PORT 1  VIEW  Result Date: 10/07/2022 CLINICAL DATA:  Pneumothorax. EXAM: PORTABLE CHEST 1 VIEW COMPARISON:  October 05, 2022 FINDINGS: Since the previous exam there has been interval removal of a LEFT sided venous catheter. RIGHT-sided chest tube remains in place. Feeding tube courses through in off the field of the radiograph placed since previous imaging. EKG leads project over the chest. Cardiomediastinal contours and hilar structures are stable. There is a persistent amount of subcutaneous emphysema about the chest which appears mildly decreased. No visible pneumothorax. LEFT basilar airspace disease is similar to recent imaging likely associated with small LEFT pleural effusion. On limited assessment no acute skeletal findings. IMPRESSION: 1. Interval removal of LEFT-sided venous catheter. 2. RIGHT-sided chest tube remains in place. No visible pneumothorax. 3. Persistent LEFT basilar airspace disease likely associated with small LEFT pleural effusion. 4. Decreased subcutaneous emphysema. Electronically Signed   By: Zetta Bills M.D.   On: 10/07/2022 08:17   DG Abd Portable 1V  Result Date: 10/05/2022 CLINICAL DATA:  Feeding tube placement EXAM: PORTABLE ABDOMEN - 1 VIEW COMPARISON:  09/28/2022 FINDINGS: Soft feeding tube enters the abdomen has its tip in the antrum of the stomach. IMPRESSION: Soft feeding tube tip in the antrum of the stomach. Electronically Signed   By: Nelson Chimes M.D.   On: 10/05/2022 11:56   DG CHEST PORT 1 VIEW  Result Date: 10/05/2022 CLINICAL DATA:  88 year old male with recent sepsis. Colitis. Status post CPR on 09/28/2022 with rib fractures and small pneumothorax. Chest tube. EXAM: PORTABLE CHEST 1 VIEW COMPARISON:  Portable chest 10/03/2022 and earlier. FINDINGS: Portable AP semi upright view at 0507 hours. Extubated and enteric tube removed. Pigtail right chest tube and left IJ central line remain. Improved lung volumes. No pneumothorax identified. Improved bilateral  ventilation, especially on the left, with decreased but not fully resolved left lung base veiling opacity. Mediastinal contours are within normal limits. Visualized tracheal air column is within normal limits. Stable visualized osseous structures. Paucity of bowel gas in the upper abdomen. Right upper quadrant surgical clips  again noted. IMPRESSION: 1. Extubated and enteric tube removed with improved lung volumes and ventilation. Residual left lung base opacity. 2. Stable right chest tube.  No pneumothorax identified. Electronically Signed   By: Genevie Ann M.D.   On: 10/05/2022 06:30      Signature  Lala Lund M.D on 10/07/2022 at 10:45 AM   -  To page go to [email or web address]

## 2022-10-07 NOTE — Evaluation (Signed)
Occupational Therapy Evaluation Patient Details Name: Daniel Cooke MRN: [id number] DOB: 11-21-1924 Today's Date: 10/07/2022   History of Present Illness Pt is a 88 y.o. male who presented 09/25/22 with AMS and weakness. CT scan of the head noted no acute abnormality. Pt hospitalized 9/30-10/3 with acute diverticulitis of the proximal sigmoid colon without signs of perforation or abscess and acute kidney failure. This admission, CT scan of the abdomen and pelvis noted increasing inflammatory changes surrounding the sigmoid colon and compatible with acute diverticulitis and stable compression fracture of T12/L5. Admitted with C. diff, leukocystosis, and acute metabolic encephalopathy. Pt developed delirium and then likely had aspiration event on 10/16 leading to cardiac arrest. ROSC 10 minutes. Pt intubated. Pt with pneumothorax from CPR and chest tube placed 10/17. Self extubated 10/22. PMH: DM2, afib on Eliquis, CKD stage IIIb, HTN, CHF   Clinical Impression   Pt now s/p CPR, chest tube placement, and extubation. Pt supine and restless on arrival. Upon OT eval, pt unable to provide history, so history obtained from chart. Pt with max difficulty with verbal expression and not following commands this session. Pt with active movement of BUE during session and seemed to have some attempts to move noxious stimuli (catheter). Pt requires total A for all ADL at this time with no active attempts to initiate or sustain personal care tasks. Not attempting to make eye contact, but scanning in room, and maintaining eyes open for majority of session. OT facilitating calm, organized regulatory state with soft music and repositioning, and pt with good response, but for short period before resuming restless state. Recommending SNF for continued OT services to optimize safety and participation in ADL and IADL.      Recommendations for follow up therapy are one component of a multi-disciplinary discharge planning  process, led by the attending physician.  Recommendations may be updated based on patient status, additional functional criteria and insurance authorization.   Follow Up Recommendations  Skilled nursing-short term rehab (<3 hours/day)    Assistance Recommended at Discharge Frequent or constant Supervision/Assistance  Patient can return home with the following Two people to help with walking and/or transfers;Two people to help with bathing/dressing/bathroom;Assistance with cooking/housework;Assistance with feeding;Direct supervision/assist for medications management;Direct supervision/assist for financial management;Assist for transportation;Help with stairs or ramp for entrance    Functional Status Assessment  Patient has had a recent decline in their functional status and/or demonstrates limited ability to make significant improvements in function in a reasonable and predictable amount of time  Equipment Recommendations  Other (comment) (defer to next venue)    Recommendations for Other Services       Precautions / Restrictions Precautions Precautions: Fall;Other (comment) Precaution Comments: chest tube, flexiseal, cortrak, restraints Restrictions Weight Bearing Restrictions: No      Mobility Bed Mobility Overal bed mobility: Needs Assistance Bed Mobility: Rolling Rolling: Total assist         General bed mobility comments: Not following commands. Performing all bed mobility with +2 and pt not an active participant    Transfers                   General transfer comment: defferred due to low level and not following commands.      Balance               Standing balance comment: deferred  ADL either performed or assessed with clinical judgement   ADL Overall ADL's : Needs assistance/impaired Eating/Feeding: NPO   Grooming: Wash/dry face;Total assistance;Bed level   Upper Body Bathing: Total assistance   Lower  Body Bathing: Total assistance;Bed level;+2 for physical assistance;+2 for safety/equipment   Upper Body Dressing : Total assistance   Lower Body Dressing: Total assistance;+2 for physical assistance;+2 for safety/equipment;Bed level   Toilet Transfer: Total assistance;+2 for physical assistance;+2 for safety/equipment   Toileting- Clothing Manipulation and Hygiene: Total assistance;+2 for physical assistance;+2 for safety/equipment;Bed level   Tub/ Shower Transfer: Total assistance;+2 for physical assistance;+2 for safety/equipment   Functional mobility during ADLs: Total assistance;+2 for physical assistance;+2 for safety/equipment General ADL Comments: Limited by difficulty mainatining calm regulatory state, decreased communication, not following commands.     Vision   Vision Assessment?: Vision impaired- to be further tested in functional context Additional Comments: Pt with no meaningful eye contact, observed to look around room, but overall with no sustained meaningful gaze, with L head turn preference.     Perception     Praxis      Pertinent Vitals/Pain Pain Assessment Pain Assessment: Faces Faces Pain Scale: Hurts even more Pain Location: generalized with mobility Pain Descriptors / Indicators: Grimacing, Moaning Pain Intervention(s): Limited activity within patient's tolerance, Monitored during session, Relaxation, Repositioned     Hand Dominance Right   Extremity/Trunk Assessment Upper Extremity Assessment Upper Extremity Assessment: Generalized weakness;Difficult to assess due to impaired cognition (Moving BUE and able to touch face, but not following commands. [place].)   Lower Extremity Assessment Lower Extremity Assessment: Defer to PT evaluation       Communication Communication Communication: HOH   Cognition Arousal/Alertness: Awake/alert Behavior During Therapy: Restless Overall Cognitive Status: Difficult to assess                                  General Comments: Pt awake with eyes opening and talking unintelligibly. Not following any commands. Limited response to voice, will inconsistently open eyes to verbal stimuli. With mitts removed, does seem to intentionally attempt to move noxious stimuli (reaching toward catheter)     General Comments       Exercises Exercises: Other exercises Other Exercises Other Exercises: PROM BUE 5x each.   Shoulder Instructions      Home Living Family/patient expects to be discharged to:: Private residence Living Arrangements: Children Available Help at Discharge: Personal care attendant;Family;Available 24 hours/day Type of Home: House Home Access: Stairs to enter [employer] of Steps: 5 Entrance Stairs-Rails: Can reach both Home Layout: One level     Bathroom Shower/Tub: Occupational psychologist: Handicapped height Bathroom Accessibility: Yes   Home Equipment: Conservation officer, nature (2 wheels);Cane - single point;Wheelchair - manual;BSC/3in1;Shower seat   Additional Comments: "nelly" is his 24/7 caregiver to assist with him. - pt has assist for household mobility and all ADL's.      Prior Functioning/Environment Prior Level of Function : Needs assist       Physical Assist : Mobility (physical);ADLs (physical) Mobility (physical): Gait;Stairs ADLs (physical): Bathing;Dressing;Toileting;IADLs Mobility Comments: walks with walker. has several in the home ADLs Comments: caregiver assists with whatever he needs by  mostly cooking, cleaning, bathing, dressing.        OT Problem List: Decreased strength;Decreased activity tolerance;Impaired balance (sitting and/or standing);Decreased cognition;Pain;Impaired UE functional use      OT Treatment/Interventions: Self-care/ADL training;Therapeutic activities;Balance training;Therapeutic exercise;DME and/or AE  instruction;Neuromuscular education;Cognitive remediation/compensation;Visual/perceptual  remediation/compensation;Patient/family education    OT Goals(Current goals can be found in the care plan section) Acute Rehab OT Goals Patient Stated Goal: unable to state OT Goal Formulation: With patient Time For Goal Achievement: 10/21/22 Potential to Achieve Goals: Fair  OT Frequency: Min 1X/week    Co-evaluation              AM-PAC OT "6 Clicks" Daily Activity     Outcome Measure Help from another person eating meals?: Total Help from another person taking care of personal grooming?: Total Help from another person toileting, which includes using toliet, bedpan, or urinal?: Total Help from another person bathing (including washing, rinsing, drying)?: Total Help from another person to put on and taking off regular upper body clothing?: Total Help from another person to put on and taking off regular lower body clothing?: Total 6 Click Score: 6   End of Session    Activity Tolerance: Other (comment) (Limited due to restlessness, slight agitation with lines/leads.) Patient left:    OT Visit Diagnosis: Muscle weakness (generalized) (M62.81);Other symptoms and signs involving cognitive function;Other symptoms and signs involving the nervous system (R29.898);Cognitive communication deficit (R41.841);Pain Pain - part of body:  (generalized)                Time: 7062-3762 OT Time Calculation (min): 21 min Charges:  OT General Charges $OT Visit: 1 Visit OT Evaluation $OT Eval Moderate Complexity: 1 Mod  Shanda Howells, OTR/L [facility] Acute Rehabilitation Office: [phone]   Lula Olszewski 10/07/2022, 5:28 PM

## 2022-10-07 NOTE — Progress Notes (Signed)
NAME:  Daniel Cooke, MRN:  [id number], DOB:  June 01, 1924, LOS: 12 ADMISSION DATE:  09/25/2022, CONSULTATION DATE:  09/28/22 REFERRING MD:  Code team, CHIEF COMPLAINT:  Arrest   History of Present Illness:  88 y/o M with hx of CKD, HLD, DM2 but otherwise fully functional who presented with recurrent colitis found to have C difficile infection.  Hospital course complicated by delirium.  Unfortunately he had what seems to be a aspiration event then had cardiac arrest.  ROSC within 10 mins.  Overbreathing vent.  PCCM consulted to assist with post-CPR management.  Intubated and not following commands initially. More alert 10/22 - moving spontaneously, followed commands on LUE.   Pertinent  Medical History  Arthritis  HTN  PAF - CHADS2VASC 4, on eliquis  Chronic Diastolic CHF - 05/2951 EF 84-13% DM II  Shingles  HOH - wears hearing aides Osteoarthritis   Significant Hospital Events: Including procedures, antibiotic start and stop dates in addition to other pertinent events   10/13 admit with C. Diff 10/16 cardiac arrest 10/17 pneumothorax> chest tube placed. EEG with burst suppression  10/20 SBT but mental status precludes extubation. EEG/MRI negative.  10/21 Eyes open, squeezed left hand to command 10/22 Self Extubated, mildly agitated, on 2 L [place], required NTS for secretion management 10/23 Cortrack placed for feedings 10/24 transferred out of ICU 10/25 Chest tube clamped 2pm  Interim History / Subjective:  No air leak from chest tube on water seal. Clamped at 2pm with plans for CXR at 6pm. If stable, can consider d/c chest tube   Objective   Blood pressure (!) 143/87, pulse 88, temperature 98.7 F (37.1 C), temperature source Axillary, resp. rate (!) 25, height 5\' 10"  (1.778 m), weight 90.6 kg, SpO2 97 %.        Intake/Output Summary (Last 24 hours) at 10/07/2022 1415 Last data filed at 10/07/2022 1320 Gross per 24 hour  Intake 490.62 ml  Output 2190 ml  Net -1699.38 ml     Filed Weights   09/28/22 1600 10/06/22 0500 10/07/22 0500  Weight: 79 kg 90.3 kg 90.6 kg    Examination: General: Adult male, resting in bed, in NAD. Neuro: Sleepy but arouses to voice and noxious stimuli, slow to respond. HEENT: [place]/AT. Sclerae anicteric. EOMI. Thick secretions in oropharynx required suctioning by me. Cardiovascular: RRR, no M/R/G.  Lungs: Respirations even and unlabored.  CTA bilaterally, No W/R/R. R chest tube in place on water seal, no air leak. Abdomen: BS x 4, soft, NT/ND.  Musculoskeletal: No gross deformities, 1+ edema.  Skin: Intact, warm, no rashes.  Assessment & Plan:    Traumatic R PTX from CPR - resolved, currently has chest tube on water seal with no air leak. - Clamped chest tube at 1400 today. - Repeat CXR at 1800 today and if stable, Dr. Lake Bells to see and consider d/c chest tube today vs AM 10/26.   S/p PEA Arrest in the setting of Hypoxia d/t Aspiration Post CPR Demand Ischemia Persistent Atrial Fibrillation HTN Acute Respiratory Failure d/t Aspiration Pneumonia Left Lower Lobe Pneumonia C. Difficile Colitis Acute Metabolic Encephalopathy DM II Macrocytic Anemia AKI CKD stage 3b Hypokalemia NAGMA, resolved Protein-Calorie Malnutrition Liver Shock Post-arrest - All per primary team.    Best Practice (right click and "Reselect all SmartList Selections" daily)  Per primary   Montey Hora, PA - [facility] For pager details, please see AMION or use Epic chat  After 1900, please call [facility] for  cross coverage needs 10/07/2022, 2:20 PM

## 2022-10-07 NOTE — Plan of Care (Signed)
  Problem: Education: Goal: Ability to describe self-care measures that may prevent or decrease complications (Diabetes Survival Skills Education) will improve Outcome: Progressing Goal: Individualized Educational Video(s) Outcome: Progressing   

## 2022-10-07 NOTE — Consult Note (Addendum)
Cardiology Consultation   Patient ID: Daniel Cooke MRN: [id number]; DOB: [birth year]  Admit date: 09/25/2022 Date of Consult: 10/07/2022  PCP:  Glenis Smoker, MD   [facility] Cardiologist:  Will Meredith Leeds, MD  Electrophysiologist:  Constance Haw, MD       Patient Profile:   Daniel Cooke is a 88 y.o. male with a hx of hypertension, diabetes, hyperlipidemia, atrial fibrillation chads2vasc=5 on eliquis (HTN, age, dm, Hfpef), CKD stage IIIb with who is being seen 10/07/2022 for the evaluation of atrial fibrillation with slow ventricular response at the request of Dr. Candiss Norse.  History of Present Illness:   Daniel Cooke presents with acute delerium in the setting of c. Diff on 09/25/2022. He had SIRS with leukocytosis. He's had mild demand ischemia. His course is complicated by PEA arrest in the setting of aspiration. He had ROSC after 10 minutes of CPR. He had a pneumothorax from chest xray s/p chest tube placed. He continues to be delerious. Palliative care is talking with the family. He is still FULL code.   He has stable atrial fibrillation with rates to the 40's at times; predominantly rate controlled. Blood pressures are tolerating. He is not on AVN blocking agents. He is continued on home eliquis.   He has known persistent atrial fibrillation on eliquis. Planned for rate control strategy. His EF is normal with pulmonary HTN. Calcified aortic valve with no significant stenosis. Noted hx of HFpEF, no recent admission for VO.   Past Medical History:  Diagnosis Date   Arthritis    "was in left knee before replacement; now have it in my right knee" (11/25/2016)   BPH (benign prostatic hypertrophy)    Chronic diastolic CHF (congestive heart failure) ([place])    a. 02/2016 Echo: EF 60-65%, no rwma, triv AI, mild MR, mildly to mod dil LA, mod dil RA, PASP 13mmHg.   Dysrhythmia    Essential hypertension    Hard of hearing    wears bilateral  hearing aids   Hypercholesterolemia    Osteoarthritis    PAF (paroxysmal atrial fibrillation) ([place])    a. CHADS2VASC score of 4 --> coumadin.   Pilonidal cyst    PAST HX - NO PROBLEM NOW   Pneumonia    "one time; years ago" (11/25/2016)   Shingles    "long time ago"   Synovitis of knee 02/27/2014   Type II diabetes mellitus ([place])    oral meds - no insulin    Past Surgical History:  Procedure Laterality Date   APPENDECTOMY     CARDIOVERSION N/A 02/04/2017   Procedure: CARDIOVERSION;  Surgeon: Lelon Perla, MD;  Location: [facility];  Service: Cardiovascular;  Laterality: N/A;   CATARACT EXTRACTION W/ INTRAOCULAR LENS IMPLANT Left 10/2016   CHOLECYSTECTOMY OPEN     COLONOSCOPY W/ BIOPSIES  08/2005   Archie Endo 04/27/2011   EYE SURGERY Right    "right" traumatic cataract removed--injury to the eye--states his eyesight is ok in left eye   JOINT REPLACEMENT     KNEE ARTHROSCOPY Left 02/28/2014   Procedure: ARTHROSCOPY LEFT KNEE WITH SYNOVECTOMY;  Surgeon: Gearlean Alf, MD;  Location: [facility];  Service: Orthopedics;  Laterality: Left;   KYPHOPLASTY  2017   KYPHOPLASTY N/A 05/28/2017   Procedure: Lumbar Four Kyphoplasty;  Surgeon: Erline Levine, MD;  Location: [facility];  Service: Neurosurgery;  Laterality: N/A;  L4 Kyphoplasty   SHOULDER OPEN ROTATOR CUFF REPAIR Left    TONSILLECTOMY  TOTAL KNEE ARTHROPLASTY  02/08/2012   Procedure: TOTAL KNEE ARTHROPLASTY;  Surgeon: Gearlean Alf, MD;  Location: [facility];  Service: Orthopedics;  Laterality: Left;   VASECTOMY         Inpatient Medications: Scheduled Meds:  [START ON 10/08/2022] amLODipine  10 mg Per Tube Daily   apixaban  2.5 mg Per Tube BID   bethanechol  5 mg Per Tube TID   Chlorhexidine Gluconate Cloth  6 each Topical Daily   cyanocobalamin  1,000 mcg Per Tube Daily   famotidine  10.4 mg Per Tube QODAY   [START ON 10/08/2022] feeding supplement (PROSource TF20)  45 mL Per Tube Daily   fidaxomicin  200 mg Per NG tube BID    folic acid  1 mg Per Tube Daily   free water  250 mL Per Tube Q6H   guaiFENesin  5 mL Per Tube Q12H   insulin aspart  0-15 Units Subcutaneous Q4H   insulin aspart  4 Units Subcutaneous Q4H   insulin glargine-yfgn  16 Units Subcutaneous Daily   mouth rinse  15 mL Mouth Rinse Q2H   sodium chloride flush  10 mL Intrapleural Q8H   Continuous Infusions:  sodium chloride Stopped (10/05/22 1236)   dextrose 100 mL/hr at 10/07/22 1320   feeding supplement (OSMOLITE 1.5 CAL) 1,000 mL (10/06/22 2036)   PRN Meds: sodium chloride, acetaminophen **OR** acetaminophen, albuterol, fentaNYL (SUBLIMAZE) injection, mouth rinse, polyvinyl alcohol  Allergies:    Allergies  Allergen Reactions   Codeine Nausea And Vomiting and Other (See Comments)    MAKES ME CRAZY   Oxycodone Nausea And Vomiting and Other (See Comments)    MAKES ME CRAZY   Hydrocodone Nausea Only and Other (See Comments)    Daughter states "messes him up mentally"   Nsaids     Other reaction(s): Contraindicated d/t CKD   Other Other (See Comments)    All narcotic pain medicine makes him crazy.    Oxybutynin     Other reaction(s): dry mouth   Tramadol Hcl     Other reaction(s): altered mental status    Social History:   Social History   Socioeconomic History   Marital status: Married    Spouse name: Not on file   Number of children: Not on file   Years of education: Not on file   Highest education level: Not on file  Occupational History   Not on file  Tobacco Use   Smoking status: Never   Smokeless tobacco: Never  Vaping Use   Vaping Use: Never used  Substance and Sexual Activity   Alcohol use: No   Drug use: No   Sexual activity: Not on file  Other Topics Concern   Not on file  Social History Narrative   Not on file   Social Determinants of Health   Financial Resource Strain: Not on file  Food Insecurity: No Food Insecurity (09/12/2022)   Hunger Vital Sign    Worried About Running Out of Food in the Last  Year: Never true    Ran Out of Food in the Last Year: Never true  Transportation Needs: No Transportation Needs (09/13/2022)   PRAPARE - Hydrologist (Medical): No    Lack of Transportation (Non-Medical): No  Physical Activity: Not on file  Stress: Not on file  Social Connections: Not on file  Intimate Partner Violence: Not At Risk (09/13/2022)   Humiliation, Afraid, Rape, and Kick questionnaire    Fear of  Current or Ex-Partner: No    Emotionally Abused: No    Physically Abused: No    Sexually Abused: No    Family History:    Family History  Problem Relation Age of Onset   Heart disease Mother    Heart disease Brother    Heart attack Brother      ROS:  Please see the history of present illness.   All other ROS reviewed and negative.     Physical Exam/Data:   Vitals:   10/07/22 1000 10/07/22 1200 10/07/22 1400 10/07/22 1600  BP:  (!) 143/87  (!) 115/57  Pulse: 91 88 (!) 50 69  Resp: (!) 26 (!) 25 (!) 23 (!) 23  Temp:  98.7 F (37.1 C)  98.7 F (37.1 C)  TempSrc:  Axillary  Axillary  SpO2: 99% 97% 97% 93%  Weight:      Height:        Intake/Output Summary (Last 24 hours) at 10/07/2022 1730 Last data filed at 10/07/2022 1320 Gross per 24 hour  Intake 303 ml  Output 1660 ml  Net -1357 ml      10/07/2022    5:00 AM 10/06/2022    5:00 AM 09/28/2022    4:00 PM  Last 3 Weights  Weight (lbs) 199 lb 11.8 oz 199 lb 1.2 oz 174 lb 2.6 oz  Weight (kg) 90.6 kg 90.3 kg 79 kg     Body mass index is 28.66 kg/m.  General:  Delirious  HEENT: normal Cardiac:  normal S1, S2; irregular rhythm; no murmur  Lungs:  nl wob, exam limited Ext: no significant pitting edema Musculoskeletal:  No deformities, BUE and BLE strength normal and equal Skin: warm and dry  Neuro:  CNs 2-12 intact, no focal abnormalities noted Psych:  Normal affect   EKG:  The EKG was personally reviewed and demonstrates:  Low voltage, rate controlled atrial  fibrillation  Telemetry:  Telemetry was personally reviewed and demonstrates:  rates to the 40s, predominantly rate controlled afib  Relevant CV Studies: TTE 09/28/2022 1. Left ventricular ejection fraction, by estimation, is 65 to 70%. The  left ventricle has normal function. The left ventricle has no regional  wall motion abnormalities. Left ventricular diastolic parameters are  indeterminate. There is the  interventricular septum is flattened in systole, consistent with right  ventricular pressure overload.   2. Right ventricular systolic function is mildly reduced. The right  ventricular size is moderately enlarged. There is moderately elevated  pulmonary artery systolic pressure. The estimated right ventricular  systolic pressure is [id number] mmHg.   3. Right atrial size was severely dilated.   4. The mitral valve is grossly normal. Mild mitral valve regurgitation.   5. Tricuspid valve regurgitation is mild to moderate.   6. The aortic valve is tricuspid. There is severe calcifcation of the  aortic valve. There is severe thickening of the aortic valve. Aortic valve  regurgitation is not visualized. Aortic valve sclerosis/calcification is  present, without any evidence of  aortic stenosis.   7. Aortic dilatation noted. There is borderline dilatation of the aortic  root, measuring 37 mm.   8. The inferior vena cava is dilated in size with <50% respiratory  variability, suggesting right atrial pressure of 15 mmHg.   Laboratory Data:  High Sensitivity Troponin:   Recent Labs  Lab 09/25/22 0055 09/25/22 0700 09/28/22 1304 09/28/22 1618 09/28/22 2149  TROPONINIHS 53* 64* 216* 232* 308*     Chemistry Recent Labs  Lab 10/02/22 0223  10/03/22 0403 10/04/22 0536 10/05/22 0517 10/06/22 0402 10/07/22 0842  NA 148* 144   < > 146* 146* 150*  K 3.6 3.6   < > 3.6 3.4* 4.1  CL 115* 111   < > 111 111 117*  CO2 25 24   < > 24 25 23   GLUCOSE 222* 296*   < > 104* 266* 150*  BUN 72*  74*   < > 74* 78* 73*  CREATININE 2.07* 1.95*   < > 1.89* 1.96* 1.82*  CALCIUM 9.2 9.0   < > 9.4 9.4 9.5  MG 2.2 2.1  --   --   --  2.3  GFRNONAA 29* 31*   < > 32* 31* 34*  ANIONGAP 8 9   < > 11 10 10    < > = values in this interval not displayed.    Recent Labs  Lab 10/03/22 0403 10/04/22 0536 10/06/22 0402  PROT 4.9* 4.4* 4.8*  ALBUMIN 2.2* 1.9* 2.1*  AST 23 31 49*  ALT 26 26 38  ALKPHOS 92 88 153*  BILITOT 1.4* 1.2 1.4*   Lipids No results for input(s): "CHOL", "TRIG", "HDL", "LABVLDL", "LDLCALC", "CHOLHDL" in the last 168 hours.  Hematology Recent Labs  Lab 10/05/22 0517 10/06/22 0402 10/07/22 0842  WBC 14.6* 12.0* 12.5*  RBC 3.40* 3.39* 3.52*  HGB 10.8* 10.7* 11.2*  HCT 34.2* 34.2* 36.0*  MCV 100.6* 100.9* 102.3*  MCH 31.8 31.6 31.8  MCHC 31.6 31.3 31.1  RDW 14.9 14.7 15.0  PLT 221 220 228   Thyroid No results for input(s): "TSH", "FREET4" in the last 168 hours.  BNPNo results for input(s): "BNP", "PROBNP" in the last 168 hours.  DDimer No results for input(s): "DDIMER" in the last 168 hours.   Radiology/Studies:  DG Chest Port 1 View  Result Date: 10/07/2022 CLINICAL DATA:  Shortness of breath.  Chest tube. EXAM: PORTABLE CHEST 1 VIEW COMPARISON:  Earlier same day chest radiograph at 0511 hours; CT chest 09/28/2022 FINDINGS: Interval retraction of the right pigtail pleural drainage catheter which now projects at the lateral aspect of the right lower hemithorax. Enteric tube tip is below the diaphragm but not included on the image. Persistent dense retrocardiac airspace opacity and interstitial opacities in the left mid and lower lung. Small left pleural effusion. No pneumothorax. Stable cardiomediastinal silhouette. Aortic calcifications. Surgical clips overlie the right chest wall as well as the right upper abdomen. Changes of vertebral body augmentation of the upper lumbar spine. IMPRESSION: Interval retraction of the right chest tube which is now located at the  lateral aspect of the lower right hemithorax. The pigtail projects within the thoracic cavity. No pneumothorax. Unchanged left basilar airspace opacities, likely reflecting combination of small left pleural effusion and atelectasis, though superimposed infection cannot be excluded. Electronically Signed   By: Ileana Roup M.D.   On: 10/07/2022 14:46   DG Abd Portable 1V  Result Date: 10/07/2022 CLINICAL DATA:  Nausea EXAM: PORTABLE ABDOMEN - 1 VIEW COMPARISON:  10/05/2022 FINDINGS: Soft feeding tube tip is in the antrum of the stomach. The bowel gas pattern is normal without evidence of ileus or obstruction. Normal amount of fecal matter. Old spinal augmentation. Previous cholecystectomy. IMPRESSION: Soft feeding tube tip in the antrum of the stomach. Electronically Signed   By: Nelson Chimes M.D.   On: 10/07/2022 11:47   DG CHEST PORT 1 VIEW  Result Date: 10/07/2022 CLINICAL DATA:  Pneumothorax. EXAM: PORTABLE CHEST 1 VIEW COMPARISON:  October 05, 2022  FINDINGS: Since the previous exam there has been interval removal of a LEFT sided venous catheter. RIGHT-sided chest tube remains in place. Feeding tube courses through in off the field of the radiograph placed since previous imaging. EKG leads project over the chest. Cardiomediastinal contours and hilar structures are stable. There is a persistent amount of subcutaneous emphysema about the chest which appears mildly decreased. No visible pneumothorax. LEFT basilar airspace disease is similar to recent imaging likely associated with small LEFT pleural effusion. On limited assessment no acute skeletal findings. IMPRESSION: 1. Interval removal of LEFT-sided venous catheter. 2. RIGHT-sided chest tube remains in place. No visible pneumothorax. 3. Persistent LEFT basilar airspace disease likely associated with small LEFT pleural effusion. 4. Decreased subcutaneous emphysema. Electronically Signed   By: Zetta Bills M.D.   On: 10/07/2022 08:17   DG Abd  Portable 1V  Result Date: 10/05/2022 CLINICAL DATA:  Feeding tube placement EXAM: PORTABLE ABDOMEN - 1 VIEW COMPARISON:  09/28/2022 FINDINGS: Soft feeding tube enters the abdomen has its tip in the antrum of the stomach. IMPRESSION: Soft feeding tube tip in the antrum of the stomach. Electronically Signed   By: Nelson Chimes M.D.   On: 10/05/2022 11:56   DG CHEST PORT 1 VIEW  Result Date: 10/05/2022 CLINICAL DATA:  88 year old male with recent sepsis. Colitis. Status post CPR on 09/28/2022 with rib fractures and small pneumothorax. Chest tube. EXAM: PORTABLE CHEST 1 VIEW COMPARISON:  Portable chest 10/03/2022 and earlier. FINDINGS: Portable AP semi upright view at 0507 hours. Extubated and enteric tube removed. Pigtail right chest tube and left IJ central line remain. Improved lung volumes. No pneumothorax identified. Improved bilateral ventilation, especially on the left, with decreased but not fully resolved left lung base veiling opacity. Mediastinal contours are within normal limits. Visualized tracheal air column is within normal limits. Stable visualized osseous structures. Paucity of bowel gas in the upper abdomen. Right upper quadrant surgical clips again noted. IMPRESSION: 1. Extubated and enteric tube removed with improved lung volumes and ventilation. Residual left lung base opacity. 2. Stable right chest tube.  No pneumothorax identified. Electronically Signed   By: Genevie Ann M.D.   On: 10/05/2022 06:30     Assessment and Plan:   Persistent atrial fibrillation: predominantly rate controlled, had heart rates to the 40s. Maintaining blood pressures. He is on eliquis. Can avoid rate controlling agents at this time. Overall his prognosis is poor and recommend continued goals of care with palliative care and his family to consider comfort measures. Cardiology will sign off.   Time Spent Directly with Patient:  I have spent a total of 45 minutes with the patient reviewing hospital notes,  telemetry, EKGs, labs and examining the patient as well as establishing an assessment and plan that was discussed personally with the patient.  > 50% of time was spent in direct patient care.   Risk Assessment/Risk Scores:       For questions or updates, please contact [facility] Please consult [email or web address] for contact info under    Signed, Janina Mayo, MD  10/07/2022 5:30 PM

## 2022-10-07 NOTE — Progress Notes (Signed)
Speech Language Pathology Treatment: Dysphagia  Patient Details Name: Daniel Cooke MRN: [id number] DOB: 11-28-1924 Today's Date: 10/07/2022 Time: 8850-2774 SLP Time Calculation (min) (ACUTE ONLY): 14 min  Assessment / Plan / Recommendation Clinical Impression  Pt seen for ongoing dysphagia management.  SLP provided oral care prior to administration of PO trials. Pt biting down on [place] and suction catheter.  Pt repositioned in bed prior to PO trials.  Pt continues with AMS and was noted to call out during today's session, but was not intelligible.  With ice chips and small amounts of water by spoon, pt exhibited adequate oral response.  Pt required multiple (3-4) swallows per small bolus. Pt exhibited wet vocal quality, throat clearing, and immediate and delayed coughing.  Pt is not appropriate to initiate PO diet and is unable to participate in instrumental assessment.  Suspect both a cognitive and physiological component to current presentation. Post extubation symptoms may have resolved by this time.   Recommend pt remain NPO with alternate means of nutrition, hydration, and medication.   Continue conversations around goals of care; would not recommend long term AMN.   HPI HPI: 88 year old man with hx of CKD, HLD, DM2 but otherwise fully functional who presented with recurrent colitis found to have C difficile infection.  Hospital course complicated by delirium.  10/16 had possible aspiration event/ cardiac arrest.  ROSC within 10 mins. Intubated 10/16-22, when he self-extubated.  Swallow initially assessed 10/15.      SLP Plan  Continue with current plan of care      Recommendations for follow up therapy are one component of a multi-disciplinary discharge planning process, led by the attending physician.  Recommendations may be updated based on patient status, additional functional criteria and insurance authorization.    Recommendations  Diet recommendations: NPO Medication  Administration: Via alternative means                Oral Care Recommendations: Oral care QID Follow Up Recommendations: Skilled nursing-short term rehab (<3 hours/day) Assistance recommended at discharge: Frequent or constant Supervision/Assistance SLP Visit Diagnosis: Dysphagia, unspecified (R13.10) Plan: Continue with current plan of care           Celedonio Savage, [place], [place] Office: [phone] 10/07/2022, 10:16 AM

## 2022-10-07 NOTE — Progress Notes (Signed)
Patient ID: [name], male   DOB: Apr 23, 1924, 88 y.o.   MRN: [id number]    Progress Note from the Palliative Medicine Team at [facility]   Patient Name: Daniel Cooke        Date: 10/07/2022 DOB: Feb 20, 1924  Age: 88 y.o. MRN#: [id number] Attending Physician: Thurnell Lose, MD Primary Care Physician: Glenis Smoker, MD Admit Date: 09/25/2022   Medical records reviewed   88 year old man with hx of CKD, HLD, DM2 but otherwise fully functional who presented with recurrent colitis found to have C difficile infection.  Hospital course complicated by delirium.  Unfortunately  had what seems to be a aspiration event then had cardiac arrest.  ROSC within 10 mins.      Family face ongoing  treatment option decisions, advanced directive decisions and anticipatory care needs.   10/13 admit with C. Diff. 10/16 cardiac arrest 10/17 pneumothorax> chest tube placed. EEG with burst suppression.  10/20 SBT but mental status precludes extubation. EEG/MRI negative.  10/21 Eyes open, squeezed left hand to command 10/22 Self Extubated, mildly agitated, on 2 L [place], required NTS for secretion management 10/23 Cortrack placed for feedings 10/25 transferred out of ICU to Walker unit    This NP assessed patient at the bedside as a follow up for palliative medicine needs support.    Patient is confused and agitated, hollering out.  Patient with core track, chest tube and rectal tube in place.  PT working with patient   Spoke to patient's daughter [name] and caregiver/Nellie in the hallway for continue conversation regarding current medical situation, specifically to concern for  likely long-term poor prognosis.    Daughter is "seeing some progress".  I expressed my concern for the patient's high risk for decompensation and overall failure to thrive.    Education offered on concern for need for reintubation and overall failure to thrive.  Family remain hopeful for continued  improvement and return to baseline  Plan of Care: -Full code    Educated family to consider DNR/DNI status understanding evidenced based poor outcomes in similar hospitalized patient, as the cause of arrest is likely associated with advanced chronic illness rather than an easily reversible acute cardio-pulmonary event.   Education offered today regarding  the importance of continued conversation with family and their  medical providers regarding overall plan of care and treatment options,  ensuring decisions are within the context of the patients values and GOCs.  Questions and concerns addressed   Discussed with Dr Waunita Schooner and bedside RN  50 minutes  PMT will continue to support holistically   Wadie Lessen NP  Palliative Medicine Team Team Phone # [phone] Pager [phone]

## 2022-10-07 NOTE — IPAL (Signed)
  Interdisciplinary Goals of Care Family Meeting   Date carried out:: 10/07/2022  Location of the meeting: Phone conference  Member's involved: Physician and Family Member or next of kin  Durable Power of Attorney or acting medical decision maker: daughter [name]    Discussion: We discussed goals of care for [name] .  I explained that he has made little progress this week and remains very weak and debilitated.  The chances of another aspiration event or cardiac arrest remain high.  If he were to undergo CPR again or life support the chances of survival to hospital discharge are near zero.  She voiced understanding.  I asked her to consider changing code status to DNAR with continued full medical support.   Code status: Full Code Sister asked for two more days to think about code status decision  Disposition: Continue current acute care   Time spent for the meeting: 11 minutes  Roselie Awkward 10/07/2022, 3:38 PM'

## 2022-10-07 NOTE — Evaluation (Signed)
Physical Therapy Evaluation Patient Details Name: Daniel Cooke MRN: [id number] DOB: 03-06-1924 Today's Date: 10/07/2022  History of Present Illness  Pt is a 88 y.o. male who presented 09/25/22 with AMS and weakness. CT scan of the head noted no acute abnormality. Pt hospitalized 9/30-10/3 with acute diverticulitis of the proximal sigmoid colon without signs of perforation or abscess and acute kidney failure. This admission, CT scan of the abdomen and pelvis noted increasing inflammatory changes surrounding the sigmoid colon and compatible with acute diverticulitis and stable compression fracture of T12/L5. Admitted with C. diff, leukocystosis, and acute metabolic encephalopathy. Pt developed delirium and then likely had aspiration event on 10/16 leading to cardiac arrest. ROSC 10 minutes. Pt intubated. Pt with pneumothorax from CPR and chest tube placed 10/17. Self extubated 10/22. PMH: DM2, afib on Eliquis, CKD stage IIIb, HTN, CHF  Clinical Impression  Pt now s/p CPR, intubation/extubation since last seen by PT. Pt currently not following any commands with limited ability to participate in therapy. Will do a 2 week trial of PT to see if pt progresses. Expect pt will need significant assist for all mobility for the foreseeable future.        Recommendations for follow up therapy are one component of a multi-disciplinary discharge planning process, led by the attending physician.  Recommendations may be updated based on patient status, additional functional criteria and insurance authorization.  Follow Up Recommendations Skilled nursing-short term rehab (<3 hours/day) Can patient physically be transported by private vehicle: No    Assistance Recommended at Discharge Frequent or constant Supervision/Assistance  Patient can return home with the following  Two people to help with walking and/or transfers;Two people to help with bathing/dressing/bathroom;Direct supervision/assist for medications  management;Assistance with cooking/housework;Help with stairs or ramp for entrance    Equipment Recommendations Hospital bed;Other (comment) (hoyer lift)  Recommendations for Other Services       Functional Status Assessment Patient has had a recent decline in their functional status and/or demonstrates limited ability to make significant improvements in function in a reasonable and predictable amount of time     Precautions / Restrictions Precautions Precautions: Fall;Other (comment) Precaution Comments: chest tube, flexiseal, cortrak, restraints      Mobility  Bed Mobility               General bed mobility comments: Unable to attempt with 1 person    Transfers                   General transfer comment: Deferred due to low level and not following any commands    Ambulation/Gait                  Stairs            Wheelchair Mobility    Modified Rankin (Stroke Patients Only)       Balance                                             Pertinent Vitals/Pain Pain Assessment Pain Assessment: CPOT Facial Expression: Relaxed, neutral Body Movements: Restlessness Muscle Tension: Tense, rigid Compliance with ventilator (intubated pts.): N/A Vocalization (extubated pts.): Sighing, moaning CPOT Total: 4    Home Living Family/patient expects to be discharged to:: Private residence Living Arrangements: Children Available Help at Discharge: Personal care attendant;Family;Available 24 hours/day Type of Home: House Home Access: Stairs  to enter Entrance Stairs-Rails: Can reach both Entrance Stairs-Number of Steps: 5   Home Layout: One level Home Equipment: Conservation officer, nature (2 wheels);Cane - single point;Wheelchair - manual;BSC/3in1;Shower seat Additional Comments: "nelly" is his 24/7 caregiver to assist with him. - pt has assist for household mobility and all ADL's.    Prior Function Prior Level of Function : Needs assist        Physical Assist : Mobility (physical);ADLs (physical) Mobility (physical): Gait;Stairs ADLs (physical): Bathing;Dressing;Toileting;IADLs Mobility Comments: walks with walker. has several in the home ADLs Comments: caregiver assists with whatever he needs by  mostly cooking, cleaning, bathing, dressing.     Hand Dominance   Dominant Hand: Right    Extremity/Trunk Assessment   Upper Extremity Assessment Upper Extremity Assessment: Defer to OT evaluation    Lower Extremity Assessment Lower Extremity Assessment: Difficult to assess due to impaired cognition (Pt unable to follow commands. AAROM WFL. Pt spontaneously assisting with movment of LE's)       Communication   Communication: HOH  Cognition Arousal/Alertness: Awake/alert Behavior During Therapy: Restless Overall Cognitive Status: Difficult to assess                                 General Comments: Pt awake with eyes opening and talking unintelligibly. Not following any commands. Not responding to voice.        General Comments      Exercises General Exercises - Lower Extremity Ankle Circles/Pumps: PROM, Both, 5 reps, Supine Heel Slides: AAROM, Both, 5 reps, Supine Hip ABduction/ADduction: AAROM, Both, 5 reps, Supine   Assessment/Plan    PT Assessment Patient needs continued PT services  PT Problem List Decreased strength;Decreased mobility;Decreased balance;Decreased activity tolerance;Decreased cognition       PT Treatment Interventions DME instruction;Functional mobility training;Therapeutic activities;Therapeutic exercise;Balance training;Cognitive remediation;Patient/family education;Gait training    PT Goals (Current goals can be found in the Care Plan section)  Acute Rehab PT Goals Patient Stated Goal: Unable to state PT Goal Formulation: Patient unable to participate in goal setting Time For Goal Achievement: 10/21/22 Potential to Achieve Goals: Fair    Frequency Min  2X/week     Co-evaluation               AM-PAC PT "6 Clicks" Mobility  Outcome Measure Help needed turning from your back to your side while in a flat bed without using bedrails?: Total Help needed moving from lying on your back to sitting on the side of a flat bed without using bedrails?: Total Help needed moving to and from a bed to a chair (including a wheelchair)?: Total Help needed standing up from a chair using your arms (e.g., wheelchair or bedside chair)?: Total Help needed to walk in hospital room?: Total Help needed climbing 3-5 steps with a railing? : Total 6 Click Score: 6    End of Session   Activity Tolerance: Other (comment) (Limited by cognition) Patient left: in bed;with bed alarm set;with restraints reapplied;with call bell/phone within reach   PT Visit Diagnosis: Other abnormalities of gait and mobility (R26.89);Difficulty in walking, not elsewhere classified (R26.2)    Time: [id number] PT Time Calculation (min) (ACUTE ONLY): 17 min   Charges:   PT Evaluation $PT Eval Moderate Complexity: 1 [place] [facility] [place] 10/07/2022, 12:15 PM

## 2022-10-08 ENCOUNTER — Inpatient Hospital Stay (HOSPITAL_COMMUNITY): Payer: Medicare Other

## 2022-10-08 DIAGNOSIS — S270XXD Traumatic pneumothorax, subsequent encounter: Secondary | ICD-10-CM | POA: Diagnosis not present

## 2022-10-08 DIAGNOSIS — A0472 Enterocolitis due to Clostridium difficile, not specified as recurrent: Secondary | ICD-10-CM | POA: Diagnosis not present

## 2022-10-08 LAB — MAGNESIUM: Magnesium: 2.2 mg/dL (ref 1.7–2.4)

## 2022-10-08 LAB — COMPREHENSIVE METABOLIC PANEL
ALT: 43 U/L (ref 0–44)
AST: 57 U/L — ABNORMAL HIGH (ref 15–41)
Albumin: 2.2 g/dL — ABNORMAL LOW (ref 3.5–5.0)
Alkaline Phosphatase: 133 U/L — ABNORMAL HIGH (ref 38–126)
Anion gap: 8 (ref 5–15)
BUN: 67 mg/dL — ABNORMAL HIGH (ref 8–23)
CO2: 26 mmol/L (ref 22–32)
Calcium: 9.3 mg/dL (ref 8.9–10.3)
Chloride: 111 mmol/L (ref 98–111)
Creatinine, Ser: 1.69 mg/dL — ABNORMAL HIGH (ref 0.61–1.24)
GFR, Estimated: 37 mL/min — ABNORMAL LOW (ref >60)
Glucose, Bld: 258 mg/dL — ABNORMAL HIGH (ref 70–99)
Potassium: 4.7 mmol/L (ref 3.5–5.1)
Sodium: 145 mmol/L (ref 135–145)
Total Bilirubin: 1.3 mg/dL — ABNORMAL HIGH (ref 0.3–1.2)
Total Protein: 5 g/dL — ABNORMAL LOW (ref 6.5–8.1)

## 2022-10-08 LAB — CBC WITH DIFFERENTIAL/PLATELET
Abs Immature Granulocytes: 0.03 10*3/uL (ref 0.00–0.07)
Abs Immature Granulocytes: 0.06 10*3/uL (ref 0.00–0.07)
Basophils Absolute: 0.1 10*3/uL (ref 0.0–0.1)
Basophils Absolute: 0.1 10*3/uL (ref 0.0–0.1)
Basophils Relative: 1 %
Basophils Relative: 1 %
Eosinophils Absolute: 0.2 10*3/uL (ref 0.0–0.5)
Eosinophils Absolute: 0.2 10*3/uL (ref 0.0–0.5)
Eosinophils Relative: 2 %
Eosinophils Relative: 2 %
HCT: 31.5 % — ABNORMAL LOW (ref 39.0–52.0)
HCT: 33.1 % — ABNORMAL LOW (ref 39.0–52.0)
Hemoglobin: 9.2 g/dL — ABNORMAL LOW (ref 13.0–17.0)
Hemoglobin: 10.5 g/dL — ABNORMAL LOW (ref 13.0–17.0)
Immature Granulocytes: 0 %
Immature Granulocytes: 1 %
Lymphocytes Relative: 13 %
Lymphocytes Relative: 14 %
Lymphs Abs: 1.2 10*3/uL (ref 0.7–4.0)
Lymphs Abs: 1.7 10*3/uL (ref 0.7–4.0)
MCH: 31.8 pg (ref 26.0–34.0)
MCH: 32.5 pg (ref 26.0–34.0)
MCHC: 29.2 g/dL — ABNORMAL LOW (ref 30.0–36.0)
MCHC: 31.7 g/dL (ref 30.0–36.0)
MCV: 109 fL — ABNORMAL HIGH (ref 80.0–100.0)
MCV: 102.5 fL — ABNORMAL HIGH (ref 80.0–100.0)
Monocytes Absolute: 0.5 10*3/uL (ref 0.1–1.0)
Monocytes Absolute: 0.7 10*3/uL (ref 0.1–1.0)
Monocytes Relative: 6 %
Monocytes Relative: 6 %
Neutro Abs: 6.9 10*3/uL (ref 1.7–7.7)
Neutro Abs: 9.7 10*3/uL — ABNORMAL HIGH (ref 1.7–7.7)
Neutrophils Relative %: 78 %
Neutrophils Relative %: 76 %
Platelets: 211 10*3/uL (ref 150–400)
Platelets: 213 10*3/uL (ref 150–400)
RBC: 2.89 MIL/uL — ABNORMAL LOW (ref 4.22–5.81)
RBC: 3.23 MIL/uL — ABNORMAL LOW (ref 4.22–5.81)
RDW: 15.4 % (ref 11.5–15.5)
RDW: 15 % (ref 11.5–15.5)
WBC: 8.8 10*3/uL (ref 4.0–10.5)
WBC: 12.5 10*3/uL — ABNORMAL HIGH (ref 4.0–10.5)
nRBC: 0 % (ref 0.0–0.2)
nRBC: 0 % (ref 0.0–0.2)

## 2022-10-08 LAB — GLUCOSE, CAPILLARY
Glucose-Capillary: 114 mg/dL — ABNORMAL HIGH (ref 70–99)
Glucose-Capillary: 115 mg/dL — ABNORMAL HIGH (ref 70–99)
Glucose-Capillary: 161 mg/dL — ABNORMAL HIGH (ref 70–99)
Glucose-Capillary: 171 mg/dL — ABNORMAL HIGH (ref 70–99)
Glucose-Capillary: 186 mg/dL — ABNORMAL HIGH (ref 70–99)
Glucose-Capillary: 190 mg/dL — ABNORMAL HIGH (ref 70–99)

## 2022-10-08 LAB — PROCALCITONIN: Procalcitonin: 0.33 ng/mL

## 2022-10-08 LAB — BRAIN NATRIURETIC PEPTIDE: B Natriuretic Peptide: 649.7 pg/mL — ABNORMAL HIGH (ref 0.0–100.0)

## 2022-10-08 MED ORDER — INSULIN ASPART 100 UNIT/ML IJ SOLN
5.0000 [IU] | INTRAMUSCULAR | Status: DC
  Administered 2022-10-08 – 2022-10-13 (×22): 5 [IU] via SUBCUTANEOUS

## 2022-10-08 MED FILL — Medication: Qty: 1 | Status: AC

## 2022-10-08 NOTE — Plan of Care (Signed)
  Problem: Education: Goal: Ability to describe self-care measures that may prevent or decrease complications (Diabetes Survival Skills Education) will improve Outcome: Progressing Goal: Individualized Educational Video(s) Outcome: Progressing   Problem: Coping: Goal: Ability to adjust to condition or change in health will improve Outcome: Progressing   Problem: Fluid Volume: Goal: Ability to maintain a balanced intake and output will improve Outcome: Progressing   Problem: Health Behavior/Discharge Planning: Goal: Ability to identify and utilize available resources and services will improve Outcome: Progressing Goal: Ability to manage health-related needs will improve Outcome: Progressing   Problem: Metabolic: Goal: Ability to maintain appropriate glucose levels will improve Outcome: Progressing   Problem: Nutritional: Goal: Maintenance of adequate nutrition will improve Outcome: Progressing Goal: Progress toward achieving an optimal weight will improve Outcome: Progressing   Problem: Skin Integrity: Goal: Risk for impaired skin integrity will decrease Outcome: Progressing   Problem: Tissue Perfusion: Goal: Adequacy of tissue perfusion will improve Outcome: Progressing   Problem: Education: Goal: Knowledge of General Education information will improve Description: Including pain rating scale, medication(s)/side effects and non-pharmacologic comfort measures Outcome: Progressing   Problem: Health Behavior/Discharge Planning: Goal: Ability to manage health-related needs will improve Outcome: Progressing   Problem: Clinical Measurements: Goal: Ability to maintain clinical measurements within normal limits will improve Outcome: Progressing Goal: Will remain free from infection Outcome: Progressing Goal: Diagnostic test results will improve Outcome: Progressing Goal: Respiratory complications will improve Outcome: Progressing Goal: Cardiovascular complication will  be avoided Outcome: Progressing   Problem: Activity: Goal: Risk for activity intolerance will decrease Outcome: Progressing   Problem: Nutrition: Goal: Adequate nutrition will be maintained Outcome: Progressing   Problem: Coping: Goal: Level of anxiety will decrease Outcome: Progressing   Problem: Elimination: Goal: Will not experience complications related to bowel motility Outcome: Progressing Goal: Will not experience complications related to urinary retention Outcome: Progressing   Problem: Pain Managment: Goal: General experience of comfort will improve Outcome: Progressing   Problem: Safety: Goal: Ability to remain free from injury will improve Outcome: Progressing   Problem: Skin Integrity: Goal: Risk for impaired skin integrity will decrease Outcome: Progressing   Problem: Education: Goal: Ability to manage disease process will improve Outcome: Progressing   Problem: Cardiac: Goal: Ability to achieve and maintain adequate cardiopulmonary perfusion will improve Outcome: Progressing   Problem: Neurologic: Goal: Promote progressive neurologic recovery Outcome: Progressing   Problem: Skin Integrity: Goal: Risk for impaired skin integrity will be minimized. Outcome: Progressing

## 2022-10-08 NOTE — Progress Notes (Signed)
NAME:  HOSEA HANAWALT, MRN:  [id number], DOB:  [birth year], LOS: 29 ADMISSION DATE:  09/25/2022, CONSULTATION DATE:  09/28/22 REFERRING MD:  Code team, CHIEF COMPLAINT:  Arrest   History of Present Illness:  88 y/o M with hx of CKD, HLD, DM2 but otherwise fully functional who presented with recurrent colitis found to have C difficile infection.  Hospital course complicated by delirium.  Unfortunately he had what seems to be a aspiration event then had cardiac arrest.  ROSC within 10 mins.  Overbreathing vent.  PCCM consulted to assist with post-CPR management.  Intubated and not following commands initially. More alert 10/22 - moving spontaneously, followed commands on LUE.   Pertinent  Medical History  Arthritis  HTN  PAF - CHADS2VASC 4, on eliquis  Chronic Diastolic CHF - 0/8676 EF 19-50% DM II  Shingles  HOH - wears hearing aides Osteoarthritis   Significant Hospital Events: Including procedures, antibiotic start and stop dates in addition to other pertinent events   10/13 admit with C. Diff 10/16 cardiac arrest 10/17 pneumothorax> chest tube placed. EEG with burst suppression  10/20 SBT but mental status precludes extubation. EEG/MRI negative.  10/21 Eyes open, squeezed left hand to command 10/22 Self Extubated, mildly agitated, on 2 L [place], required NTS for secretion management 10/23 Cortrack placed for feedings 10/24 transferred out of ICU 10/25 Chest tube clamped 2pm 10/26 chest tube pulled 0945  Interim History / Subjective:  CXR stable after chest tube clamped 2pm yesterday. Chest tube pulled this AM 0945.  Objective   Blood pressure (!) 126/42, pulse 77, temperature 99.3 F (37.4 C), temperature source Axillary, resp. rate 19, height 5\' 10"  (1.778 m), weight 90.8 kg, SpO2 100 %.        Intake/Output Summary (Last 24 hours) at 10/08/2022 0954 Last data filed at 10/07/2022 1320 Gross per 24 hour  Intake --  Output 550 ml  Net -550 ml   Filed Weights   10/06/22  0500 10/07/22 0500 10/08/22 0500  Weight: 90.3 kg 90.6 kg 90.8 kg    Examination: General: Adult male, resting in bed, in NAD. Neuro: Awake, slow to respond. Moans and groans but does not speak full sentences. HEENT: [place]/AT. Sclerae anicteric. EOMI. Cardiovascular: RRR, no M/R/G.  Lungs: Respirations even and unlabored.  CTA bilaterally, No W/R/R. R chest tube in place, clamped no air leak. Abdomen: BS x 4, soft, NT/ND.  Musculoskeletal: No gross deformities, 1+ edema.  Skin: Intact, warm, no rashes.  Assessment & Plan:    Traumatic R PTX from CPR - resolved, currently has chest tube on water seal with no air leak, clamped 2pm 10/25 and repeat CXR that evening and AM 10/26 stable with no PTX. - Chest tube d/c'd by me at 0945. - Supportive care.  S/p PEA Arrest in the setting of Hypoxia d/t Aspiration Post CPR Demand Ischemia Persistent Atrial Fibrillation HTN Acute Respiratory Failure d/t Aspiration Pneumonia Left Lower Lobe Pneumonia C. Difficile Colitis Acute Metabolic Encephalopathy DM II Macrocytic Anemia AKI CKD stage 3b Hypokalemia NAGMA, resolved Protein-Calorie Malnutrition Liver Shock Post-arrest - All per primary team.  Goals of care: Agree with palliative care involvement and continuing supportive care but placing DNR/DNI limitations given his multiple comorbidities and poor prognosis etc as outlined in Dr. Jaci Standard note 10/25.   Nothing further to add.  PCCM will sign off.  Please call us back if we can be of any further assistance.    Best Practice (right click and "Reselect all SmartList  Selections" daily)  Per primary   Montey Hora, [facility] For pager details, please see AMION or use Epic chat  After 1900, please call [place] for cross coverage needs 10/08/2022, 9:54 AM

## 2022-10-08 NOTE — Plan of Care (Signed)
  Problem: Education: Goal: Ability to describe self-care measures that may prevent or decrease complications (Diabetes Survival Skills Education) will improve Outcome: Progressing Goal: Individualized Educational Video(s) Outcome: Progressing   Problem: Coping: Goal: Ability to adjust to condition or change in health will improve Outcome: Progressing   Problem: Fluid Volume: Goal: Ability to maintain a balanced intake and output will improve Outcome: Progressing   Problem: Health Behavior/Discharge Planning: Goal: Ability to identify and utilize available resources and services will improve Outcome: Progressing Goal: Ability to manage health-related needs will improve Outcome: Progressing   Problem: Metabolic: Goal: Ability to maintain appropriate glucose levels will improve Outcome: Progressing   Problem: Nutritional: Goal: Maintenance of adequate nutrition will improve Outcome: Progressing Goal: Progress toward achieving an optimal weight will improve Outcome: Progressing   Problem: Skin Integrity: Goal: Risk for impaired skin integrity will decrease Outcome: Progressing   Problem: Tissue Perfusion: Goal: Adequacy of tissue perfusion will improve Outcome: Progressing   Problem: Education: Goal: Knowledge of General Education information will improve Description: Including pain rating scale, medication(s)/side effects and non-pharmacologic comfort measures Outcome: Progressing   Problem: Health Behavior/Discharge Planning: Goal: Ability to manage health-related needs will improve Outcome: Progressing   Problem: Clinical Measurements: Goal: Ability to maintain clinical measurements within normal limits will improve Outcome: Progressing Goal: Will remain free from infection Outcome: Progressing Goal: Diagnostic test results will improve Outcome: Progressing Goal: Respiratory complications will improve Outcome: Progressing Goal: Cardiovascular complication will  be avoided Outcome: Progressing   Problem: Activity: Goal: Risk for activity intolerance will decrease Outcome: Progressing   Problem: Nutrition: Goal: Adequate nutrition will be maintained Outcome: Progressing   Problem: Coping: Goal: Level of anxiety will decrease Outcome: Progressing   Problem: Elimination: Goal: Will not experience complications related to bowel motility Outcome: Progressing Goal: Will not experience complications related to urinary retention Outcome: Progressing   Problem: Pain Managment: Goal: General experience of comfort will improve Outcome: Progressing   Problem: Safety: Goal: Ability to remain free from injury will improve Outcome: Progressing   Problem: Skin Integrity: Goal: Risk for impaired skin integrity will decrease Outcome: Progressing   Problem: Education: Goal: Ability to manage disease process will improve Outcome: Progressing   Problem: Cardiac: Goal: Ability to achieve and maintain adequate cardiopulmonary perfusion will improve Outcome: Progressing   Problem: Neurologic: Goal: Promote progressive neurologic recovery Outcome: Progressing   Problem: Skin Integrity: Goal: Risk for impaired skin integrity will be minimized. Outcome: Progressing   

## 2022-10-08 NOTE — Progress Notes (Signed)
PROGRESS NOTE                                                                                                                                                                                                             Patient Demographics:    Daniel Cooke, is a 88 y.o. male, DOB - Oct 13, 1924, TDD:[id number]  Outpatient Primary MD for the patient is Daniel Smoker, MD    LOS - 31  Admit date - 09/25/2022    Chief Complaint  Patient presents with   Back Pain       Brief Narrative (HPI from H&P)   88 y/o M with hx of CKD 3B, HLD, DM2, Afib, HTN,  but otherwise fully functional who presented with recurrent colitis found to have C difficile infection.  Hospital course complicated by delirium.  Unfortunately he had what seems to be a aspiration event then had cardiac arrest.  He was admitted on 09/25/2022 and intubated for aspiration pneumonia related complications and cardiac arrest on 09/28/2022, he self extubated himself on 10/04/2022.  He also developed pneumothorax in ICU requiring a right-sided chest tube placement on 09/29/2022 which he still has, for his ongoing dysphagia and aspiration he has a core track tube, currently encephalopathic and restrained to the bed, overall prognosis poor, transferred to my service on 10/07/2022 on day 12 of his hospital stay.    Procedures  :     10/13 admit with C. Diff 10/16 cardiac arrest 10/16 echocardiogram as below 10/17 pneumothorax> chest tube placed. EEG with burst suppression  10/20 SBT but mental status precludes extubation. EEG/MRI negative.  10/21 Eyes open, squeezed left hand to command 10/22 Self Extubated, mildly agitated, on 2 L [place], required NTS for secretion management 10/23 Cortrack placed for feedings 10/07/2022 transferred to hospitalist service    TTE -  1. Left ventricular ejection fraction, by estimation, is 65 to 70%. The left ventricle has normal  function. The left ventricle has no regional wall motion abnormalities. Left ventricular diastolic parameters are indeterminate. There is the  interventricular septum is flattened in systole, consistent with right ventricular pressure overload.   2. Right ventricular systolic function is mildly reduced. The right ventricular size is moderately enlarged. There is moderately elevated pulmonary artery systolic pressure. The estimated right ventricular systolic pressure is 62.3 mmHg.   3. Right atrial  size was severely dilated.   4. The mitral valve is grossly normal. Mild mitral valve regurgitation.   5. Tricuspid valve regurgitation is mild to moderate.   6. The aortic valve is tricuspid. There is severe calcifcation of the aortic valve. There is severe thickening of the aortic valve. Aortic valve regurgitation is not visualized. Aortic valve sclerosis/calcification is present, without any evidence of  aortic stenosis.   7. Aortic dilatation noted. There is borderline dilatation of the aortic root, measuring 37 mm.   8. The inferior vena cava is dilated in size with <50% respiratory variability, suggesting right atrial pressure of 15 mmHg.   Subjective:    Daniel Cooke today in bed restrained to bed confused unable to answer questions or follow commands   Assessment  & Plan :   Recurrent C. difficile colitis.  On Dificid, clinically improving will monitor, currently has Flexi-Seal.  Await stool to get formed although with tube feeds might be difficult to assess.  Aspiration pneumonia causing PEA arrest, acute hypoxic respiratory failure requiring endotracheal intubation followed by self extubation.  Currently on supplemental oxygen, finished his antibiotics for aspiration pneumonia on 10/06/2022.  Currently on core track, once mental status improves will have speech therapy reassess.  Echocardiogram gram done on the day of cardiac arrest showed a preserved EF around 65% without any wall motion  abnormalities or acute changes.  Right-sided pneumothorax.  Chest tube being managed by PCCM.  Acute metabolic and toxic encephalopathy.  Due to #1 and 2 above.  Supportive care.  Currently requiring restraints to prevent self-harm.  Minimize narcotics and benzodiazepines.  Overall prognosis remains poor family wants another 1 to 2 days of supportive care then will decide on long-term goals of care and CODE STATUS.  Shock liver post PEA arrest.  Trend stable will monitor.  Mild elevation in alk phos could be due to tube feeds.  Moderate protein calorie malnutrition.  Protein supplementation.  Dehydration with hypernatremia.  Free water added received D5W on 10/07/2022.  Hypernatremia and dehydration have resolved.  Hypertension.  In poor control added Norvasc close sinus bradycardia hence no beta-blocker.  Monitor and adjust..  Paroxysmal Afib - in ICU and now with aberrant conduction, TTE stable, [place] VASC 2 will be > 2 at least, On Eliquis, some episodes of S. Loletha Grayer ? Aberrant conduction, hold further B Blocker, will get Cards input, likely no further changes needed.  Anemia.  Mild intermittent gross cytosis, monitor.  AKI on CKD 3B.  Baseline creatinine around 1.5.  Hydrate and monitor.  Urinary Retention - foley 10/07/22, Flomax once taking by mouth.  DM type II.  On Lantus along with every 4 sliding scale and every 4 NovoLog.  Monitor and adjust.  Lab Results  Component Value Date   HGBA1C 8.3 (H) 09/12/2022   CBG (last 3)  Recent Labs    10/07/22 2316 10/08/22 0331 10/08/22 0837  GLUCAP 201* 186* 190*         Condition - Extremely Guarded  Family Communication  : Daughter over the phone on 10/07/2022  Code Status : Full code  Consults  : PCCM, palliative care  PUD Prophylaxis :        Disposition Plan  :    Status is: Inpatient  DVT Prophylaxis  :    apixaban (ELIQUIS) tablet 2.5 mg Start: 09/30/22 1000 apixaban (ELIQUIS) tablet 2.5 mg    Lab  Results  Component Value Date   PLT 213 10/08/2022    Diet :  Diet Order             Diet NPO time specified  Diet effective now                    Inpatient Medications  Scheduled Meds:  amLODipine  10 mg Per Tube Daily   apixaban  2.5 mg Per Tube BID   bethanechol  5 mg Per Tube TID   Chlorhexidine Gluconate Cloth  6 each Topical Daily   cyanocobalamin  1,000 mcg Per Tube Daily   famotidine  10.4 mg Per Tube QODAY   feeding supplement (PROSource TF20)  45 mL Per Tube Daily   fidaxomicin  200 mg Per NG tube BID   folic acid  1 mg Per Tube Daily   free water  250 mL Per Tube Q6H   guaiFENesin  5 mL Per Tube Q12H   insulin aspart  0-15 Units Subcutaneous Q4H   insulin aspart  5 Units Subcutaneous Q4H   insulin glargine-yfgn  16 Units Subcutaneous Daily   mouth rinse  15 mL Mouth Rinse Q2H   sodium chloride flush  10 mL Intrapleural Q8H   Continuous Infusions:  sodium chloride Stopped (10/05/22 1236)   feeding supplement (OSMOLITE 1.5 CAL) 55 mL/hr at 10/07/22 1829   PRN Meds:.sodium chloride, acetaminophen **OR** acetaminophen, albuterol, fentaNYL (SUBLIMAZE) injection, mouth rinse, polyvinyl alcohol   Objective:   Vitals:   10/07/22 2311 10/08/22 0300 10/08/22 0500 10/08/22 0824  BP: (!) 154/68 (!) 161/73  (!) 126/42  Pulse: 69 77  77  Resp: 20 20  19   Temp: 98.5 F (36.9 C) 98.6 F (37 C)  99.3 F (37.4 C)  TempSrc: Oral Oral  Axillary  SpO2: 95% 96%  100%  Weight:   90.8 kg   Height:        Wt Readings from Last 3 Encounters:  10/08/22 90.8 kg  07/15/21 81.2 kg  05/20/21 81.6 kg     Intake/Output Summary (Last 24 hours) at 10/08/2022 1056 Last data filed at 10/08/2022 0730 Gross per 24 hour  Intake --  Output 2350 ml  Net -2350 ml     Physical Exam  Awake confused and agitated, restrained to the bed, core track in place, rectal tube in place, right-sided chest tube in place, moves all 4 extremities [place].AT,PERRAL Supple Neck, No JVD,    Symmetrical Chest wall movement, Good air movement bilaterally, CTAB RRR,No Gallops, Rubs or new Murmurs,  +ve B.Sounds, Abd Soft, No tenderness,   No Cyanosis, Clubbing or edema      RN pressure injury documentation: Pressure Injury 09/30/22 Coccyx Mid Stage 2 -  Partial thickness loss of dermis presenting as a shallow open injury with a red, pink wound bed without slough. UTA (Active)  09/30/22 2000  Location: Coccyx  Location Orientation: Mid  Staging: Stage 2 -  Partial thickness loss of dermis presenting as a shallow open injury with a red, pink wound bed without slough.  Wound Description (Comments): UTA  Present on Admission:   Dressing Type Foam - Lift dressing to assess site every shift 10/07/22 2000      Data Review:    CBC Recent Labs  Lab 10/05/22 0517 10/06/22 0402 10/07/22 0842 10/08/22 0516 10/08/22 0903  WBC 14.6* 12.0* 12.5* 8.8 12.5*  HGB 10.8* 10.7* 11.2* 9.2* 10.5*  HCT 34.2* 34.2* 36.0* 31.5* 33.1*  PLT 221 220 228 211 213  MCV 100.6* 100.9* 102.3* 109.0* 102.5*  MCH 31.8 31.6 31.8 31.8  32.5  MCHC 31.6 31.3 31.1 29.2* 31.7  RDW 14.9 14.7 15.0 15.4 15.0  LYMPHSABS  --  1.4  --  1.2 1.7  MONOABS  --  0.7  --  0.5 0.7  EOSABS  --  0.2  --  0.2 0.2  BASOSABS  --  0.1  --  0.1 0.1    Electrolytes Recent Labs  Lab 10/02/22 0223 10/03/22 0403 10/04/22 0536 10/05/22 0517 10/06/22 0402 10/07/22 0841 10/07/22 0842 10/08/22 0516 10/08/22 0903  NA 148* 144 143 146* 146*  --  150*  --  145  K 3.6 3.6 3.1* 3.6 3.4*  --  4.1  --  4.7  CL 115* 111 109 111 111  --  117*  --  111  CO2 25 24 25 24 25   --  23  --  26  GLUCOSE 222* 296* 128* 104* 266*  --  150*  --  258*  BUN 72* 74* 74* 74* 78*  --  73*  --  67*  CREATININE 2.07* 1.95* 1.89* 1.89* 1.96*  --  1.82*  --  1.69*  CALCIUM 9.2 9.0 8.6* 9.4 9.4  --  9.5  --  9.3  AST 20 23 31   --  49*  --   --   --  57*  ALT 28 26 26   --  38  --   --   --  43  ALKPHOS 77 92 88  --  153*  --   --   --  133*   BILITOT 1.1 1.4* 1.2  --  1.4*  --   --   --  1.3*  ALBUMIN 2.1* 2.2* 1.9*  --  2.1*  --   --   --  2.2*  MG 2.2 2.1  --   --   --   --  2.3  --  2.2  PROCALCITON  --   --   --   --   --  0.48  --   --   --   BNP  --   --   --   --   --   --   --  649.7*  --     Radiology Reports DG Chest Port 1 View  Result Date: 10/08/2022 CLINICAL DATA:  Shortness of breath, chest tube EXAM: PORTABLE CHEST 1 VIEW COMPARISON:  Radiograph 10/07/2022 FINDINGS: Unchanged cardiomediastinal silhouette. Feeding tube passes below the diaphragm, tip excluded by collimation. Right pigtail chest tube has migrated inferiorly towards the base of the hemithorax. There are mild diffuse interstitial opacities. Stable small pleural effusions. Persistent left basilar airspace disease, not significantly changed. No evidence of pneumothorax. Bones are unchanged. IMPRESSION: Right pigtail chest tube overlies the right lower hemithorax. No evidence of pneumothorax. Interstitial pulmonary edema.  Stable small pleural effusions. Persistent left basilar airspace disease, not significantly changed from prior. Electronically Signed   By: Maurine Simmering M.D.   On: 10/08/2022 08:41   DG Chest Port 1 View  Result Date: 10/07/2022 CLINICAL DATA:  Follow-up pneumothorax EXAM: PORTABLE CHEST 1 VIEW COMPARISON:  10/07/2022 FINDINGS: Pigtail catheter is again noted on the right. No definitive pneumothorax is seen. Feeding catheter extends into the stomach. Cardiac shadow is within normal limits. Patchy increased airspace opacity is noted in the left base relatively stable from the prior exam. No new focal abnormality is seen. IMPRESSION: Overall stable appearance of the chest.  No pneumothorax is noted. Electronically Signed   By: Linus Mako.D.  On: 10/07/2022 19:17   DG Chest Port 1 View  Result Date: 10/07/2022 CLINICAL DATA:  Shortness of breath.  Chest tube. EXAM: PORTABLE CHEST 1 VIEW COMPARISON:  Earlier same day chest radiograph at  0511 hours; CT chest 09/28/2022 FINDINGS: Interval retraction of the right pigtail pleural drainage catheter which now projects at the lateral aspect of the right lower hemithorax. Enteric tube tip is below the diaphragm but not included on the image. Persistent dense retrocardiac airspace opacity and interstitial opacities in the left mid and lower lung. Small left pleural effusion. No pneumothorax. Stable cardiomediastinal silhouette. Aortic calcifications. Surgical clips overlie the right chest wall as well as the right upper abdomen. Changes of vertebral body augmentation of the upper lumbar spine. IMPRESSION: Interval retraction of the right chest tube which is now located at the lateral aspect of the lower right hemithorax. The pigtail projects within the thoracic cavity. No pneumothorax. Unchanged left basilar airspace opacities, likely reflecting combination of small left pleural effusion and atelectasis, though superimposed infection cannot be excluded. Electronically Signed   By: Ileana Roup M.D.   On: 10/07/2022 14:46   DG Abd Portable 1V  Result Date: 10/07/2022 CLINICAL DATA:  Nausea EXAM: PORTABLE ABDOMEN - 1 VIEW COMPARISON:  10/05/2022 FINDINGS: Soft feeding tube tip is in the antrum of the stomach. The bowel gas pattern is normal without evidence of ileus or obstruction. Normal amount of fecal matter. Old spinal augmentation. Previous cholecystectomy. IMPRESSION: Soft feeding tube tip in the antrum of the stomach. Electronically Signed   By: Nelson Chimes M.D.   On: 10/07/2022 11:47   DG CHEST PORT 1 VIEW  Result Date: 10/07/2022 CLINICAL DATA:  Pneumothorax. EXAM: PORTABLE CHEST 1 VIEW COMPARISON:  October 05, 2022 FINDINGS: Since the previous exam there has been interval removal of a LEFT sided venous catheter. RIGHT-sided chest tube remains in place. Feeding tube courses through in off the field of the radiograph placed since previous imaging. EKG leads project over the chest.  Cardiomediastinal contours and hilar structures are stable. There is a persistent amount of subcutaneous emphysema about the chest which appears mildly decreased. No visible pneumothorax. LEFT basilar airspace disease is similar to recent imaging likely associated with small LEFT pleural effusion. On limited assessment no acute skeletal findings. IMPRESSION: 1. Interval removal of LEFT-sided venous catheter. 2. RIGHT-sided chest tube remains in place. No visible pneumothorax. 3. Persistent LEFT basilar airspace disease likely associated with small LEFT pleural effusion. 4. Decreased subcutaneous emphysema. Electronically Signed   By: Zetta Bills M.D.   On: 10/07/2022 08:17   DG Abd Portable 1V  Result Date: 10/05/2022 CLINICAL DATA:  Feeding tube placement EXAM: PORTABLE ABDOMEN - 1 VIEW COMPARISON:  09/28/2022 FINDINGS: Soft feeding tube enters the abdomen has its tip in the antrum of the stomach. IMPRESSION: Soft feeding tube tip in the antrum of the stomach. Electronically Signed   By: Nelson Chimes M.D.   On: 10/05/2022 11:56   DG CHEST PORT 1 VIEW  Result Date: 10/05/2022 CLINICAL DATA:  88 year old male with recent sepsis. Colitis. Status post CPR on 09/28/2022 with rib fractures and small pneumothorax. Chest tube. EXAM: PORTABLE CHEST 1 VIEW COMPARISON:  Portable chest 10/03/2022 and earlier. FINDINGS: Portable AP semi upright view at 0507 hours. Extubated and enteric tube removed. Pigtail right chest tube and left IJ central line remain. Improved lung volumes. No pneumothorax identified. Improved bilateral ventilation, especially on the left, with decreased but not fully resolved left lung base veiling opacity. Mediastinal  contours are within normal limits. Visualized tracheal air column is within normal limits. Stable visualized osseous structures. Paucity of bowel gas in the upper abdomen. Right upper quadrant surgical clips again noted. IMPRESSION: 1. Extubated and enteric tube removed with  improved lung volumes and ventilation. Residual left lung base opacity. 2. Stable right chest tube.  No pneumothorax identified. Electronically Signed   By: Genevie Ann M.D.   On: 10/05/2022 06:30      Signature  Lala Lund M.D on 10/08/2022 at 10:56 AM   -  To page go to [email or web address]

## 2022-10-09 ENCOUNTER — Inpatient Hospital Stay (HOSPITAL_COMMUNITY): Payer: Medicare Other

## 2022-10-09 DIAGNOSIS — A0472 Enterocolitis due to Clostridium difficile, not specified as recurrent: Secondary | ICD-10-CM | POA: Diagnosis not present

## 2022-10-09 LAB — GLUCOSE, CAPILLARY
Glucose-Capillary: 135 mg/dL — ABNORMAL HIGH (ref 70–99)
Glucose-Capillary: 142 mg/dL — ABNORMAL HIGH (ref 70–99)
Glucose-Capillary: 179 mg/dL — ABNORMAL HIGH (ref 70–99)
Glucose-Capillary: 185 mg/dL — ABNORMAL HIGH (ref 70–99)
Glucose-Capillary: 84 mg/dL (ref 70–99)
Glucose-Capillary: 99 mg/dL (ref 70–99)

## 2022-10-09 LAB — CBC WITH DIFFERENTIAL/PLATELET
Abs Immature Granulocytes: 0.15 10*3/uL — ABNORMAL HIGH (ref 0.00–0.07)
Basophils Absolute: 0.1 10*3/uL (ref 0.0–0.1)
Basophils Relative: 1 %
Eosinophils Absolute: 0.1 10*3/uL (ref 0.0–0.5)
Eosinophils Relative: 1 %
HCT: 34.5 % — ABNORMAL LOW (ref 39.0–52.0)
Hemoglobin: 10.6 g/dL — ABNORMAL LOW (ref 13.0–17.0)
Immature Granulocytes: 1 %
Lymphocytes Relative: 9 %
Lymphs Abs: 1.1 10*3/uL (ref 0.7–4.0)
MCH: 32 pg (ref 26.0–34.0)
MCHC: 30.7 g/dL (ref 30.0–36.0)
MCV: 104.2 fL — ABNORMAL HIGH (ref 80.0–100.0)
Monocytes Absolute: 0.5 10*3/uL (ref 0.1–1.0)
Monocytes Relative: 4 %
Neutro Abs: 10.1 10*3/uL — ABNORMAL HIGH (ref 1.7–7.7)
Neutrophils Relative %: 84 %
Platelets: 236 10*3/uL (ref 150–400)
RBC: 3.31 MIL/uL — ABNORMAL LOW (ref 4.22–5.81)
RDW: 15 % (ref 11.5–15.5)
WBC: 12.1 10*3/uL — ABNORMAL HIGH (ref 4.0–10.5)
nRBC: 0 % (ref 0.0–0.2)

## 2022-10-09 LAB — COMPREHENSIVE METABOLIC PANEL
ALT: 45 U/L — ABNORMAL HIGH (ref 0–44)
AST: 63 U/L — ABNORMAL HIGH (ref 15–41)
Albumin: 2.2 g/dL — ABNORMAL LOW (ref 3.5–5.0)
Alkaline Phosphatase: 146 U/L — ABNORMAL HIGH (ref 38–126)
Anion gap: 8 (ref 5–15)
BUN: 71 mg/dL — ABNORMAL HIGH (ref 8–23)
CO2: 25 mmol/L (ref 22–32)
Calcium: 9.6 mg/dL (ref 8.9–10.3)
Chloride: 113 mmol/L — ABNORMAL HIGH (ref 98–111)
Creatinine, Ser: 1.88 mg/dL — ABNORMAL HIGH (ref 0.61–1.24)
GFR, Estimated: 32 mL/min — ABNORMAL LOW (ref >60)
Glucose, Bld: 220 mg/dL — ABNORMAL HIGH (ref 70–99)
Potassium: 4.8 mmol/L (ref 3.5–5.1)
Sodium: 146 mmol/L — ABNORMAL HIGH (ref 135–145)
Total Bilirubin: 1.3 mg/dL — ABNORMAL HIGH (ref 0.3–1.2)
Total Protein: 5.2 g/dL — ABNORMAL LOW (ref 6.5–8.1)

## 2022-10-09 LAB — PROCALCITONIN: Procalcitonin: 0.27 ng/mL

## 2022-10-09 LAB — BRAIN NATRIURETIC PEPTIDE: B Natriuretic Peptide: 753.1 pg/mL — ABNORMAL HIGH (ref 0.0–100.0)

## 2022-10-09 LAB — MAGNESIUM: Magnesium: 2.3 mg/dL (ref 1.7–2.4)

## 2022-10-09 MED ORDER — FUROSEMIDE 10 MG/ML IJ SOLN
20.0000 mg | Freq: Once | INTRAMUSCULAR | Status: AC
  Administered 2022-10-09: 20 mg via INTRAVENOUS
  Filled 2022-10-09: qty 2

## 2022-10-09 MED ORDER — FUROSEMIDE 40 MG PO TABS: 40.0000 mg | ORAL_TABLET | Freq: Once | ORAL | Status: DC

## 2022-10-09 MED ORDER — FUROSEMIDE 10 MG/ML IJ SOLN
40.0000 mg | Freq: Once | INTRAMUSCULAR | Status: AC
  Administered 2022-10-09: 40 mg via INTRAVENOUS
  Filled 2022-10-09: qty 4

## 2022-10-09 MED ORDER — SCOPOLAMINE 1 MG/3DAYS TD PT72
1.0000 | MEDICATED_PATCH | TRANSDERMAL | Status: DC
  Administered 2022-10-09 – 2022-10-12 (×2): 1.5 mg via TRANSDERMAL
  Filled 2022-10-09 (×2): qty 1

## 2022-10-09 MED ORDER — OSMOLITE 1.5 CAL PO LIQD
1000.0000 mL | ORAL | Status: DC
  Administered 2022-10-09 – 2022-10-13 (×4): 1000 mL
  Filled 2022-10-09 (×6): qty 1000

## 2022-10-09 MED ORDER — FREE WATER
200.0000 mL | Status: DC
  Administered 2022-10-09 – 2022-10-13 (×24): 200 mL

## 2022-10-09 MED ORDER — FREE WATER: 200.0000 mL | Freq: Four times a day (QID) | Status: DC

## 2022-10-09 NOTE — Progress Notes (Addendum)
PROGRESS NOTE                                                                                                                                                                                                             Patient Demographics:    Daniel Cooke, is a 88 y.o. male, DOB - 1923/12/29, JKK:[id number]  Outpatient Primary MD for the patient is Glenis Smoker, MD    LOS - 68  Admit date - 09/25/2022    Chief Complaint  Patient presents with   Back Pain       Brief Narrative (HPI from H&P)   88 y/o M with hx of CKD 3B, HLD, DM2, Afib, HTN,  but otherwise fully functional who presented with recurrent colitis found to have C difficile infection.  Hospital course complicated by delirium.  Unfortunately he had what seems to be a aspiration event then had cardiac arrest.  He was admitted on 09/25/2022 and intubated for aspiration pneumonia related complications and cardiac arrest on 09/28/2022, he self extubated himself on 10/04/2022.  He also developed pneumothorax in ICU requiring a right-sided chest tube placement on 09/29/2022 which he still has, for his ongoing dysphagia and aspiration he has a core track tube, currently encephalopathic and restrained to the bed, overall prognosis poor, transferred to my service on 10/07/2022 on day 12 of his hospital stay.    Procedures  :     10/13 admit with C. Diff 10/16 cardiac arrest 10/16 echocardiogram as below 10/17 pneumothorax> chest tube placed. EEG with burst suppression  10/20 SBT but mental status precludes extubation. EEG/MRI negative.  10/21 Eyes open, squeezed left hand to command 10/22 Self Extubated, mildly agitated, on 2 L [place], required NTS for secretion management 10/23 Cortrack placed for feedings 10/07/2022 transferred to hospitalist service    TTE -  1. Left ventricular ejection fraction, by estimation, is 65 to 70%. The left ventricle has normal  function. The left ventricle has no regional wall motion abnormalities. Left ventricular diastolic parameters are indeterminate. There is the  interventricular septum is flattened in systole, consistent with right ventricular pressure overload.   2. Right ventricular systolic function is mildly reduced. The right ventricular size is moderately enlarged. There is moderately elevated pulmonary artery systolic pressure. The estimated right ventricular systolic pressure is 71.6 mmHg.   3. Right atrial  size was severely dilated.   4. The mitral valve is grossly normal. Mild mitral valve regurgitation.   5. Tricuspid valve regurgitation is mild to moderate.   6. The aortic valve is tricuspid. There is severe calcifcation of the aortic valve. There is severe thickening of the aortic valve. Aortic valve regurgitation is not visualized. Aortic valve sclerosis/calcification is present, without any evidence of  aortic stenosis.   7. Aortic dilatation noted. There is borderline dilatation of the aortic root, measuring 37 mm.   8. The inferior vena cava is dilated in size with <50% respiratory variability, suggesting right atrial pressure of 15 mmHg.   Subjective:    [name] today in bed restrained to bed confused unable to answer questions or follow commands   Assessment  & Plan :   Recurrent C. difficile colitis.  On Dificid, clinically resolved, no further diarrhea Flexi-Seal removed on 10/08/2022, continue Dificid, stop date 11/04/2022.  Aspiration pneumonia causing PEA arrest, acute hypoxic respiratory failure requiring endotracheal intubation followed by self extubation.  Currently on supplemental oxygen, finished his antibiotics for aspiration pneumonia on 10/06/2022.  Currently on core track, once mental status improves will have speech therapy reassess.  Echocardiogram gram done on the day of cardiac arrest showed a preserved EF around 65% without any wall motion abnormalities or acute  changes.  Right-sided pneumothorax.  Chest tube being managed by PCCM.  Ch. tube removed on 10/08/2022.  Acute metabolic and toxic encephalopathy.  Due to #1 and 2 above.  Supportive care.  Currently requiring restraints to prevent self-harm.  Minimize narcotics and benzodiazepines.  Overall prognosis remains poor, discussed in detail with daughter [name] on 10/07/2022 on 10/09/2022, patient now DNR but continue medical treatment.  Shock liver post PEA arrest.  Trend stable will monitor.  Mild elevation in alk phos could be due to tube feeds.  Moderate protein calorie malnutrition.  Protein supplementation.  Dehydration with hypernatremia.  Free water increased further as hypernatremia is reoccurring  Hypertension.  In poor control added Norvasc close sinus bradycardia hence no beta-blocker.  Monitor and adjust..  Paroxysmal Afib - in ICU and now with aberrant conduction, TTE stable, [place] VASC 2 will be > 2 at least, On Eliquis, some episodes of S. Loletha Grayer ? Aberrant conduction, hold further B Blocker, appreciate cards input, likely no further changes needed.  Anemia.  Mild intermittent macrocytosis, monitor.  AKI on CKD 3B.  Baseline creatinine around 1.5.  Hydrate and monitor.  Urinary Retention - foley 10/07/22, Flomax once taking by mouth.  DM type II.  On Lantus along with every 4 sliding scale and every 4 NovoLog.  Monitor and adjust.  Lab Results  Component Value Date   HGBA1C 8.3 (H) 09/12/2022   CBG (last 3)  Recent Labs    10/08/22 2351 10/09/22 0321 10/09/22 0833  GLUCAP 115* 179* 185*         Condition - Extremely Guarded  Family Communication  : Daughter over the phone on 10/07/2022 10/09/2022 now DNR  Code Status : DNR  Consults  : PCCM, palliative care, cardiology  PUD Prophylaxis :        Disposition Plan  :    Status is: Inpatient  DVT Prophylaxis  :    apixaban (ELIQUIS) tablet 2.5 mg Start: 09/30/22 1000 apixaban (ELIQUIS) tablet 2.5 mg     Lab Results  Component Value Date   PLT 236 10/09/2022    Diet :  Diet Order  Diet NPO time specified  Diet effective now                    Inpatient Medications  Scheduled Meds:  amLODipine  10 mg Per Tube Daily   apixaban  2.5 mg Per Tube BID   bethanechol  5 mg Per Tube TID   Chlorhexidine Gluconate Cloth  6 each Topical Daily   cyanocobalamin  1,000 mcg Per Tube Daily   famotidine  10.4 mg Per Tube QODAY   feeding supplement (PROSource TF20)  45 mL Per Tube Daily   fidaxomicin  200 mg Per NG tube BID   folic acid  1 mg Per Tube Daily   free water  200 mL Per Tube Q4H   guaiFENesin  5 mL Per Tube Q12H   insulin aspart  0-15 Units Subcutaneous Q4H   insulin aspart  5 Units Subcutaneous Q4H   insulin glargine-yfgn  16 Units Subcutaneous Daily   mouth rinse  15 mL Mouth Rinse Q2H   scopolamine  1 patch Transdermal Q72H   sodium chloride flush  10 mL Intrapleural Q8H   Continuous Infusions:  sodium chloride Stopped (10/05/22 1236)   feeding supplement (OSMOLITE 1.5 CAL) 1,000 mL (10/09/22 0840)   PRN Meds:.sodium chloride, acetaminophen **OR** acetaminophen, albuterol, mouth rinse, polyvinyl alcohol   Objective:   Vitals:   10/08/22 2348 10/09/22 0305 10/09/22 0459 10/09/22 0800  BP: (!) 119/58 (!) 124/57  128/69  Pulse: 68 62  67  Resp: _0 Temp: 98.3 F (36.8 C) 98 F (36.7 C)    TempSrc: Axillary Axillary  Oral  SpO2: 92% 94%  93%  Weight:   98.3 kg   Height:        Wt Readings from Last 3 Encounters:  10/09/22 98.3 kg  07/15/21 81.2 kg  05/20/21 81.6 kg     Intake/Output Summary (Last 24 hours) at 10/09/2022 1020 Last data filed at 10/09/2022 0902 Gross per 24 hour  Intake --  Output 1300 ml  Net -1300 ml     Physical Exam  Awake confused and agitated, restrained to the bed, core track in place, moves all 4 extremities [place].AT,PERRAL Supple Neck, No JVD,   Symmetrical Chest wall movement, Good air movement  bilaterally, CTAB RRR,No Gallops, Rubs or new Murmurs,  +ve B.Sounds, Abd Soft, No tenderness,   No Cyanosis, Clubbing or edema       RN pressure injury documentation: Pressure Injury 09/30/22 Coccyx Mid Stage 2 -  Partial thickness loss of dermis presenting as a shallow open injury with a red, pink wound bed without slough. UTA (Active)  09/30/22 2000  Location: Coccyx  Location Orientation: Mid  Staging: Stage 2 -  Partial thickness loss of dermis presenting as a shallow open injury with a red, pink wound bed without slough.  Wound Description (Comments): UTA  Present on Admission:   Dressing Type Foam - Lift dressing to assess site every shift 10/08/22 2000      Data Review:    CBC Recent Labs  Lab 10/06/22 0402 10/07/22 0842 10/08/22 0516 10/08/22 0903 10/09/22 0735  WBC 12.0* 12.5* 8.8 12.5* 12.1*  HGB 10.7* 11.2* 9.2* 10.5* 10.6*  HCT 34.2* 36.0* 31.5* 33.1* 34.5*  PLT 220 228 211 213 236  MCV 100.9* 102.3* 109.0* 102.5* 104.2*  MCH 31.6 31.8 31.8 32.5 32.0  MCHC 31.3 31.1 29.2* 31.7 30.7  RDW 14.7 15.0 15.4 15.0 15.0  LYMPHSABS 1.4  --  1.2  1.7 1.1  MONOABS 0.7  --  0.5 0.7 0.5  EOSABS 0.2  --  0.2 0.2 0.1  BASOSABS 0.1  --  0.1 0.1 0.1    Electrolytes Recent Labs  Lab 10/03/22 0403 10/04/22 0536 10/05/22 0517 10/06/22 0402 10/07/22 0841 10/07/22 0842 10/08/22 0516 10/08/22 0903 10/08/22 1235 10/09/22 0735  NA 144 143 146* 146*  --  150*  --  145  --  146*  K 3.6 3.1* 3.6 3.4*  --  4.1  --  4.7  --  4.8  CL 111 109 111 111  --  117*  --  111  --  113*  CO2 _0 --  23  --  26  --  25  GLUCOSE 296* 128* 104* 266*  --  150*  --  258*  --  220*  BUN 74* 74* 74* 78*  --  73*  --  67*  --  71*  CREATININE 1.95* 1.89* 1.89* 1.96*  --  1.82*  --  1.69*  --  1.88*  CALCIUM 9.0 8.6* 9.4 9.4  --  9.5  --  9.3  --  9.6  AST 23 31  --  49*  --   --   --  57*  --  63*  ALT 26 26  --  38  --   --   --  43  --  45*  ALKPHOS 92 88  --  153*  --   --    --  133*  --  146*  BILITOT 1.4* 1.2  --  1.4*  --   --   --  1.3*  --  1.3*  ALBUMIN 2.2* 1.9*  --  2.1*  --   --   --  2.2*  --  2.2*  MG 2.1  --   --   --   --  2.3  --  2.2  --  2.3  PROCALCITON  --   --   --   --  0.48  --   --   --  0.33 0.27  BNP  --   --   --   --   --   --  649.7*  --   --  753.1*    Radiology Reports  DG Abd Portable 1V  Result Date: 10/09/2022 CLINICAL DATA:  Shortness of breath and constipation. EXAM: PORTABLE ABDOMEN - 1 VIEW COMPARISON:  10/07/2022 FINDINGS: Unchanged position of the enteric tube with tip projecting over the mid upper abdomen in the expected location of the gastric body. Bowel gas pattern is nonobstructed. Normal stool burden. Signs of previous cholecystectomy and vertebral augmentation. IMPRESSION: Stable position of enteric tube with tip projecting over the gastric body. Nonobstructive bowel gas pattern. Electronically Signed   By: Kerby Moors M.D.   On: 10/09/2022 07:02   DG Chest Port 1 View  Result Date: 10/09/2022 CLINICAL DATA:  Shortness of breath. Altered mental status. Constipation. EXAM: PORTABLE CHEST 1 VIEW COMPARISON:  10/08/22 FINDINGS: There is a enteric tube with tip coursing below the hemidiaphragms. Heart size and mediastinal contours are stable. Right-sided thoracostomy tube has been removed. There is diffuse pulmonary vascular congestion. Atelectasis is noted within the lung bases, left greater than right. IMPRESSION: 1. Interval removal of right thoracostomy tube. No pneumothorax. 2. Pulmonary vascular congestion. 3. Bibasilar atelectasis, left greater than right. Electronically Signed   By: Kerby Moors M.D.   On: 10/09/2022 07:01   DG Chest  Port 1 View  Result Date: 10/08/2022 CLINICAL DATA:  Shortness of breath, chest tube EXAM: PORTABLE CHEST 1 VIEW COMPARISON:  Radiograph 10/07/2022 FINDINGS: Unchanged cardiomediastinal silhouette. Feeding tube passes below the diaphragm, tip excluded by collimation. Right pigtail  chest tube has migrated inferiorly towards the base of the hemithorax. There are mild diffuse interstitial opacities. Stable small pleural effusions. Persistent left basilar airspace disease, not significantly changed. No evidence of pneumothorax. Bones are unchanged. IMPRESSION: Right pigtail chest tube overlies the right lower hemithorax. No evidence of pneumothorax. Interstitial pulmonary edema.  Stable small pleural effusions. Persistent left basilar airspace disease, not significantly changed from prior. Electronically Signed   By: Maurine Simmering M.D.   On: 10/08/2022 08:41   DG Chest Port 1 View  Result Date: 10/07/2022 CLINICAL DATA:  Follow-up pneumothorax EXAM: PORTABLE CHEST 1 VIEW COMPARISON:  10/07/2022 FINDINGS: Pigtail catheter is again noted on the right. No definitive pneumothorax is seen. Feeding catheter extends into the stomach. Cardiac shadow is within normal limits. Patchy increased airspace opacity is noted in the left base relatively stable from the prior exam. No new focal abnormality is seen. IMPRESSION: Overall stable appearance of the chest.  No pneumothorax is noted. Electronically Signed   By: Inez Catalina M.D.   On: 10/07/2022 19:17   DG Chest Port 1 View  Result Date: 10/07/2022 CLINICAL DATA:  Shortness of breath.  Chest tube. EXAM: PORTABLE CHEST 1 VIEW COMPARISON:  Earlier same day chest radiograph at 0511 hours; CT chest 09/28/2022 FINDINGS: Interval retraction of the right pigtail pleural drainage catheter which now projects at the lateral aspect of the right lower hemithorax. Enteric tube tip is below the diaphragm but not included on the image. Persistent dense retrocardiac airspace opacity and interstitial opacities in the left mid and lower lung. Small left pleural effusion. No pneumothorax. Stable cardiomediastinal silhouette. Aortic calcifications. Surgical clips overlie the right chest wall as well as the right upper abdomen. Changes of vertebral body augmentation of  the upper lumbar spine. IMPRESSION: Interval retraction of the right chest tube which is now located at the lateral aspect of the lower right hemithorax. The pigtail projects within the thoracic cavity. No pneumothorax. Unchanged left basilar airspace opacities, likely reflecting combination of small left pleural effusion and atelectasis, though superimposed infection cannot be excluded. Electronically Signed   By: Ileana Roup M.D.   On: 10/07/2022 14:46   DG Abd Portable 1V  Result Date: 10/07/2022 CLINICAL DATA:  Nausea EXAM: PORTABLE ABDOMEN - 1 VIEW COMPARISON:  10/05/2022 FINDINGS: Soft feeding tube tip is in the antrum of the stomach. The bowel gas pattern is normal without evidence of ileus or obstruction. Normal amount of fecal matter. Old spinal augmentation. Previous cholecystectomy. IMPRESSION: Soft feeding tube tip in the antrum of the stomach. Electronically Signed   By: Nelson Chimes M.D.   On: 10/07/2022 11:47   DG CHEST PORT 1 VIEW  Result Date: 10/07/2022 CLINICAL DATA:  Pneumothorax. EXAM: PORTABLE CHEST 1 VIEW COMPARISON:  October 05, 2022 FINDINGS: Since the previous exam there has been interval removal of a LEFT sided venous catheter. RIGHT-sided chest tube remains in place. Feeding tube courses through in off the field of the radiograph placed since previous imaging. EKG leads project over the chest. Cardiomediastinal contours and hilar structures are stable. There is a persistent amount of subcutaneous emphysema about the chest which appears mildly decreased. No visible pneumothorax. LEFT basilar airspace disease is similar to recent imaging likely associated with small LEFT pleural  effusion. On limited assessment no acute skeletal findings. IMPRESSION: 1. Interval removal of LEFT-sided venous catheter. 2. RIGHT-sided chest tube remains in place. No visible pneumothorax. 3. Persistent LEFT basilar airspace disease likely associated with small LEFT pleural effusion. 4. Decreased  subcutaneous emphysema. Electronically Signed   By: Zetta Bills M.D.   On: 10/07/2022 08:17   DG Abd Portable 1V  Result Date: 10/05/2022 CLINICAL DATA:  Feeding tube placement EXAM: PORTABLE ABDOMEN - 1 VIEW COMPARISON:  09/28/2022 FINDINGS: Soft feeding tube enters the abdomen has its tip in the antrum of the stomach. IMPRESSION: Soft feeding tube tip in the antrum of the stomach. Electronically Signed   By: Nelson Chimes M.D.   On: 10/05/2022 11:56      Signature  Lala Lund M.D on 10/09/2022 at 10:20 AM   -  To page go to [email or web address]

## 2022-10-09 NOTE — Progress Notes (Signed)
Speech Language Pathology Treatment: Dysphagia  Patient Details Name: Daniel Cooke MRN: [id number] DOB: 1924-07-26 Today's Date: 10/09/2022 Time: 5462-7035 SLP Time Calculation (min) (ACUTE ONLY): 22 min  Assessment / Plan / Recommendation Clinical Impression  Pt seen for ongoing dysphagia management.  Pt wet very wet, rattly, congested breathing and gurgly voice sounds on arrival.  Pt repositioned in bed with PT assistance. Pt crying out with repositioning and intermittently during session. Sitting pt upright resulted in desaturation to mid 80s and tachypnea at 43, which resolved prior to administration of PO trials.  RR remained slightly high (26-33) throughout today's session.  Suspect pt is not managing secretions well.  SLP provided oral care, but only to buccal cavity and surface of teeth, as pt regularly bites down on suction catheters. Pt exhibits prompt oral response to anything introduced into oral cavity. Pt accepted ice chip trial.  Pt required 4 swallows and there was wet vocal quality.  With honey thick liquid by spoon pt bit down on spoon.  Swallow initiation delayed with possible oral holding.  Pt required 3-4 swallows.  With puree pharyngeal swallow respose was observed, but also some suspected oral holding. SLP used oral suction catheter to remove portions of bolus.  Pt is not appropriate to initiate PO diet at this time and is unable to participate in instrumental testing.    Recommend pt remain NPO with alternate means of nutrition, hydration, and medication; however, it is strongly suspected that this will not eliminate risk of aspiration.    HPI HPI: 88 year old man with hx of CKD, HLD, DM2 but otherwise fully functional who presented with recurrent colitis found to have C difficile infection.  Hospital course complicated by delirium.  10/16 had possible aspiration event/ cardiac arrest.  ROSC within 10 mins. Intubated 10/16-22, when he self-extubated.  Swallow initially  assessed 10/15.      SLP Plan  Continue with current plan of care      Recommendations for follow up therapy are one component of a multi-disciplinary discharge planning process, led by the attending physician.  Recommendations may be updated based on patient status, additional functional criteria and insurance authorization.    Recommendations  Diet recommendations: NPO Medication Administration: Via alternative means                Oral Care Recommendations: Oral care QID Follow Up Recommendations: Skilled nursing-short term rehab (<3 hours/day) Assistance recommended at discharge: Frequent or constant Supervision/Assistance SLP Visit Diagnosis: Dysphagia, unspecified (R13.10) Plan: Continue with current plan of care           Celedonio Savage, [place], [place] Office: [phone] 10/09/2022, 10:45 AM

## 2022-10-09 NOTE — Plan of Care (Signed)
  Problem: Education: Goal: Ability to describe self-care measures that may prevent or decrease complications (Diabetes Survival Skills Education) will improve Outcome: Progressing Goal: Individualized Educational Video(s) Outcome: Progressing   Problem: Coping: Goal: Ability to adjust to condition or change in health will improve Outcome: Progressing   Problem: Fluid Volume: Goal: Ability to maintain a balanced intake and output will improve Outcome: Progressing   Problem: Health Behavior/Discharge Planning: Goal: Ability to identify and utilize available resources and services will improve Outcome: Progressing Goal: Ability to manage health-related needs will improve Outcome: Progressing   Problem: Metabolic: Goal: Ability to maintain appropriate glucose levels will improve Outcome: Progressing   Problem: Nutritional: Goal: Maintenance of adequate nutrition will improve Outcome: Progressing Goal: Progress toward achieving an optimal weight will improve Outcome: Progressing   Problem: Skin Integrity: Goal: Risk for impaired skin integrity will decrease Outcome: Progressing   Problem: Tissue Perfusion: Goal: Adequacy of tissue perfusion will improve Outcome: Progressing   Problem: Education: Goal: Knowledge of General Education information will improve Description: Including pain rating scale, medication(s)/side effects and non-pharmacologic comfort measures Outcome: Progressing   Problem: Health Behavior/Discharge Planning: Goal: Ability to manage health-related needs will improve Outcome: Progressing   Problem: Clinical Measurements: Goal: Ability to maintain clinical measurements within normal limits will improve Outcome: Progressing Goal: Will remain free from infection Outcome: Progressing Goal: Diagnostic test results will improve Outcome: Progressing Goal: Respiratory complications will improve Outcome: Progressing Goal: Cardiovascular complication will  be avoided Outcome: Progressing   Problem: Activity: Goal: Risk for activity intolerance will decrease Outcome: Progressing   Problem: Nutrition: Goal: Adequate nutrition will be maintained Outcome: Progressing   Problem: Coping: Goal: Level of anxiety will decrease Outcome: Progressing   Problem: Elimination: Goal: Will not experience complications related to bowel motility Outcome: Progressing Goal: Will not experience complications related to urinary retention Outcome: Progressing   Problem: Pain Managment: Goal: General experience of comfort will improve Outcome: Progressing   Problem: Safety: Goal: Ability to remain free from injury will improve Outcome: Progressing   Problem: Skin Integrity: Goal: Risk for impaired skin integrity will decrease Outcome: Progressing   Problem: Education: Goal: Ability to manage disease process will improve Outcome: Progressing   Problem: Cardiac: Goal: Ability to achieve and maintain adequate cardiopulmonary perfusion will improve Outcome: Progressing   Problem: Neurologic: Goal: Promote progressive neurologic recovery Outcome: Progressing   Problem: Skin Integrity: Goal: Risk for impaired skin integrity will be minimized. Outcome: Progressing   

## 2022-10-09 NOTE — Progress Notes (Signed)
Physical Therapy Treatment/Discharge Note Patient Details Name: Daniel Cooke MRN: [id number] DOB: Mar 30, 1924 Today's Date: 10/09/2022   History of Present Illness Pt is a 88 y.o. male who presented 09/25/22 with AMS and weakness. CT scan of the head noted no acute abnormality. Pt hospitalized 9/30-10/3 with acute diverticulitis of the proximal sigmoid colon without signs of perforation or abscess and acute kidney failure. This admission, CT scan of the abdomen and pelvis noted increasing inflammatory changes surrounding the sigmoid colon and compatible with acute diverticulitis and stable compression fracture of T12/L5. Admitted with C. diff, leukocystosis, and acute metabolic encephalopathy. Pt developed delirium and then likely had aspiration event on 10/16 leading to cardiac arrest. ROSC 10 minutes. Pt intubated. Pt with pneumothorax from CPR and chest tube placed 10/17. Self extubated 10/22. PMH: DM2, afib on Eliquis, CKD stage IIIb, HTN, CHF    PT Comments    Pt not following commands, restless, crying out and generally appears miserable. Unable to participate in PT. Will dc from PT since anything I did seemed to increase pt's discomfort and not productive or appropriate.    Recommendations for follow up therapy are one component of a multi-disciplinary discharge planning process, led by the attending physician.  Recommendations may be updated based on patient status, additional functional criteria and insurance authorization.  Follow Up Recommendations  Long-term institutional care without follow-up therapy Can patient physically be transported by private vehicle: No   Assistance Recommended at Discharge Frequent or constant Supervision/Assistance  Patient can return home with the following Two people to help with walking and/or transfers;Two people to help with bathing/dressing/bathroom;Direct supervision/assist for medications management;Assistance with cooking/housework;Help with  stairs or ramp for entrance   Equipment Recommendations  Hospital bed;Other (comment) (hoyer lift)    Recommendations for Other Services       Precautions / Restrictions Precautions Precautions: Fall;Other (comment) Precaution Comments: enteric, cortrak, restraints     Mobility  Bed Mobility               General bed mobility comments: Slid pt up in bed and placed bed in upright position    Transfers                   General transfer comment: Unable to attempt due to pt unable to participate    Ambulation/Gait                   Stairs             Wheelchair Mobility    Modified Rankin (Stroke Patients Only)       Balance       Sitting balance - Comments: Leaning rt in support upright in bed. Requires total assist to reposition.                                    Cognition Arousal/Alertness: Awake/alert Behavior During Therapy: Restless Overall Cognitive Status: Difficult to assess                                 General Comments: Pt awake with eyes open, moaning, calling out and talking unintelligibly. Not following any commands.        Exercises General Exercises - Lower Extremity Ankle Circles/Pumps: PROM, Both, 5 reps, Supine Heel Slides: AAROM, Both, 5 reps, Supine Hip ABduction/ADduction: AAROM, Both, 5 reps, Supine  General Comments        Pertinent Vitals/Pain Pain Assessment Facial Expression: Grimacing Body Movements: Restlessness Muscle Tension: Tense, rigid Compliance with ventilator (intubated pts.): N/A Vocalization (extubated pts.): Crying out, sobbing CPOT Total: 7    Home Living                          Prior Function            PT Goals (current goals can now be found in the care plan section) Acute Rehab PT Goals Patient Stated Goal: Unable to state Progress towards PT goals: Not progressing toward goals - comment    Frequency            PT Plan Discharge plan needs to be updated    Co-evaluation PT/OT/SLP Co-Evaluation/Treatment: Yes Reason for Co-Treatment: Complexity of the patient's impairments (multi-system involvement);For patient/therapist safety PT goals addressed during session: Strengthening/ROM        AM-PAC PT "6 Clicks" Mobility   Outcome Measure  Help needed turning from your back to your side while in a flat bed without using bedrails?: Total Help needed moving from lying on your back to sitting on the side of a flat bed without using bedrails?: Total Help needed moving to and from a bed to a chair (including a wheelchair)?: Total Help needed standing up from a chair using your arms (e.g., wheelchair or bedside chair)?: Total Help needed to walk in hospital room?: Total Help needed climbing 3-5 steps with a railing? : Total 6 Click Score: 6    End of Session   Activity Tolerance: Other (comment);Patient limited by pain (Limited by cognition) Patient left: in bed;with bed alarm set;with restraints reapplied;with call bell/phone within reach   PT Visit Diagnosis: Other abnormalities of gait and mobility (R26.89);Difficulty in walking, not elsewhere classified (R26.2)     Time: 2202-5427 PT Time Calculation (min) (ACUTE ONLY): 24 min  Charges:  $Therapeutic Activity: 8-22 mins                     [facility] PT Acute Rehabilitation Services Office [phone]    Angelina Ok [facility] 10/09/2022, 12:26 PM

## 2022-10-09 NOTE — Progress Notes (Signed)
   10/09/22 1600  Assess: MEWS Score  Temp 97.6 F (36.4 C)  BP (!) 124/58  MAP (mmHg) 78  Pulse Rate 71  ECG Heart Rate 72  Resp (!) 30  SpO2 94 %  O2 Device Nasal Cannula  O2 Flow Rate (L/min) 2 L/min  Assess: MEWS Score  MEWS Temp 0  MEWS Systolic 0  MEWS Pulse 0  MEWS RR 2  MEWS LOC 0  MEWS Score 2  MEWS Score Color Yellow  Assess: if the MEWS score is Yellow or Red  Were vital signs taken at a resting state? Yes  Focused Assessment No change from prior assessment  Does the patient meet 2 or more of the SIRS criteria? Yes  Does the patient have a confirmed or suspected source of infection? Yes  Provider and Rapid Response Notified? No  MEWS guidelines implemented *See Row Information* Yes  Treat  MEWS Interventions Escalated (See documentation below)  Take Vital Signs  Increase Vital Sign Frequency  Yellow: Q 2hr X 2 then Q 4hr X 2, if remains yellow, continue Q 4hrs  Escalate  MEWS: Escalate Yellow: discuss with charge nurse/RN and consider discussing with provider and RRT  Notify: Charge Nurse/RN  Name of Charge Nurse/RN Notified Erin  Date Charge Nurse/RN Notified 10/09/22  Time Charge Nurse/RN Notified 1700  Document  Progress note created (see row info) Yes  Assess: SIRS CRITERIA  SIRS Temperature  0  SIRS Pulse 0  SIRS Respirations  1  SIRS WBC 1  SIRS Score Sum  2

## 2022-10-10 ENCOUNTER — Inpatient Hospital Stay (HOSPITAL_COMMUNITY): Payer: Medicare Other

## 2022-10-10 DIAGNOSIS — A0472 Enterocolitis due to Clostridium difficile, not specified as recurrent: Secondary | ICD-10-CM | POA: Diagnosis not present

## 2022-10-10 LAB — COMPREHENSIVE METABOLIC PANEL
ALT: 44 U/L (ref 0–44)
AST: 59 U/L — ABNORMAL HIGH (ref 15–41)
Albumin: 2.2 g/dL — ABNORMAL LOW (ref 3.5–5.0)
Alkaline Phosphatase: 146 U/L — ABNORMAL HIGH (ref 38–126)
Anion gap: 12 (ref 5–15)
BUN: 74 mg/dL — ABNORMAL HIGH (ref 8–23)
CO2: 24 mmol/L (ref 22–32)
Calcium: 9.5 mg/dL (ref 8.9–10.3)
Chloride: 109 mmol/L (ref 98–111)
Creatinine, Ser: 1.8 mg/dL — ABNORMAL HIGH (ref 0.61–1.24)
GFR, Estimated: 34 mL/min — ABNORMAL LOW (ref >60)
Glucose, Bld: 200 mg/dL — ABNORMAL HIGH (ref 70–99)
Potassium: 5 mmol/L (ref 3.5–5.1)
Sodium: 145 mmol/L (ref 135–145)
Total Bilirubin: 1.6 mg/dL — ABNORMAL HIGH (ref 0.3–1.2)
Total Protein: 5 g/dL — ABNORMAL LOW (ref 6.5–8.1)

## 2022-10-10 LAB — CBC WITH DIFFERENTIAL/PLATELET
Abs Immature Granulocytes: 0.11 10*3/uL — ABNORMAL HIGH (ref 0.00–0.07)
Basophils Absolute: 0.1 10*3/uL (ref 0.0–0.1)
Basophils Relative: 1 %
Eosinophils Absolute: 0.1 10*3/uL (ref 0.0–0.5)
Eosinophils Relative: 1 %
HCT: 33.7 % — ABNORMAL LOW (ref 39.0–52.0)
Hemoglobin: 10.5 g/dL — ABNORMAL LOW (ref 13.0–17.0)
Immature Granulocytes: 1 %
Lymphocytes Relative: 10 %
Lymphs Abs: 1.4 10*3/uL (ref 0.7–4.0)
MCH: 32.2 pg (ref 26.0–34.0)
MCHC: 31.2 g/dL (ref 30.0–36.0)
MCV: 103.4 fL — ABNORMAL HIGH (ref 80.0–100.0)
Monocytes Absolute: 0.6 10*3/uL (ref 0.1–1.0)
Monocytes Relative: 4 %
Neutro Abs: 12.1 10*3/uL — ABNORMAL HIGH (ref 1.7–7.7)
Neutrophils Relative %: 83 %
Platelets: 182 10*3/uL (ref 150–400)
RBC: 3.26 MIL/uL — ABNORMAL LOW (ref 4.22–5.81)
RDW: 15 % (ref 11.5–15.5)
WBC: 14.4 10*3/uL — ABNORMAL HIGH (ref 4.0–10.5)
nRBC: 0 % (ref 0.0–0.2)

## 2022-10-10 LAB — GLUCOSE, CAPILLARY
Glucose-Capillary: 200 mg/dL — ABNORMAL HIGH (ref 70–99)
Glucose-Capillary: 230 mg/dL — ABNORMAL HIGH (ref 70–99)
Glucose-Capillary: 231 mg/dL — ABNORMAL HIGH (ref 70–99)
Glucose-Capillary: 106 mg/dL — ABNORMAL HIGH (ref 70–99)
Glucose-Capillary: 53 mg/dL — ABNORMAL LOW (ref 70–99)
Glucose-Capillary: 73 mg/dL (ref 70–99)
Glucose-Capillary: 159 mg/dL — ABNORMAL HIGH (ref 70–99)

## 2022-10-10 LAB — PROCALCITONIN: Procalcitonin: 0.26 ng/mL

## 2022-10-10 LAB — BRAIN NATRIURETIC PEPTIDE: B Natriuretic Peptide: 496.9 pg/mL — ABNORMAL HIGH (ref 0.0–100.0)

## 2022-10-10 LAB — MAGNESIUM: Magnesium: 2.3 mg/dL (ref 1.7–2.4)

## 2022-10-10 MED ORDER — HALOPERIDOL LACTATE 5 MG/ML IJ SOLN
2.0000 mg | Freq: Four times a day (QID) | INTRAMUSCULAR | Status: DC | PRN
  Administered 2022-10-12: 2 mg via INTRAMUSCULAR
  Filled 2022-10-10: qty 1

## 2022-10-10 MED ORDER — MORPHINE SULFATE (PF) 2 MG/ML IV SOLN
1.0000 mg | Freq: Four times a day (QID) | INTRAVENOUS | Status: DC | PRN
  Administered 2022-10-10 – 2022-10-13 (×7): 1 mg via INTRAVENOUS
  Filled 2022-10-10 (×7): qty 1

## 2022-10-10 NOTE — Progress Notes (Signed)
RT called to assess patient as WOB increased. Patient confused and agitated.  NTS attempted and not tolerated well. Patient had coughed up a moderate amount of sputum prior to RT assessment. Albuterol given and not tolerated well. Patient remains in an agitated state. [place] 2L.  RN aware.

## 2022-10-10 NOTE — Progress Notes (Signed)
PROGRESS NOTE                                                                                                                                                                                                             Patient Demographics:    Daniel Cooke, is a 88 y.o. male, DOB - 02/22/1924, DJT:[id number]  Outpatient Primary MD for the patient is Daniel Smoker, MD    LOS - 70  Admit date - 09/25/2022    Chief Complaint  Patient presents with   Back Pain       Brief Narrative (HPI from H&P)   88 y/o M with hx of CKD 3B, HLD, DM2, Afib, HTN,  but otherwise fully functional who presented with recurrent colitis found to have C difficile infection.  Hospital course complicated by delirium.  Unfortunately he had what seems to be a aspiration event then had cardiac arrest.  He was admitted on 09/25/2022 and intubated for aspiration pneumonia related complications and cardiac arrest on 09/28/2022, he self extubated himself on 10/04/2022.  He also developed pneumothorax in ICU requiring a right-sided chest tube placement on 09/29/2022 which he still has, for his ongoing dysphagia and aspiration he has a core track tube, currently encephalopathic and restrained to the bed, overall prognosis poor, transferred to my service on 10/07/2022 on day 12 of his hospital stay.    Procedures  :     10/13 admit with C. Diff 10/16 cardiac arrest 10/16 echocardiogram as below 10/17 pneumothorax> chest tube placed. EEG with burst suppression  10/20 SBT but mental status precludes extubation. EEG/MRI negative.  10/21 Eyes open, squeezed left hand to command 10/22 Self Extubated, mildly agitated, on 2 L [place], required NTS for secretion management 10/23 Cortrack placed for feedings 10/07/2022 transferred to hospitalist service    TTE -  1. Left ventricular ejection fraction, by estimation, is 65 to 70%. The left ventricle has normal  function. The left ventricle has no regional wall motion abnormalities. Left ventricular diastolic parameters are indeterminate. There is the  interventricular septum is flattened in systole, consistent with right ventricular pressure overload.   2. Right ventricular systolic function is mildly reduced. The right ventricular size is moderately enlarged. There is moderately elevated pulmonary artery systolic pressure. The estimated right ventricular systolic pressure is 30.0 mmHg.   3. Right atrial  size was severely dilated.   4. The mitral valve is grossly normal. Mild mitral valve regurgitation.   5. Tricuspid valve regurgitation is mild to moderate.   6. The aortic valve is tricuspid. There is severe calcifcation of the aortic valve. There is severe thickening of the aortic valve. Aortic valve regurgitation is not visualized. Aortic valve sclerosis/calcification is present, without any evidence of  aortic stenosis.   7. Aortic dilatation noted. There is borderline dilatation of the aortic root, measuring 37 mm.   8. The inferior vena cava is dilated in size with <50% respiratory variability, suggesting right atrial pressure of 15 mmHg.   Subjective:    Daniel Cooke today in bed restrained to bed confused unable to answer questions or follow commands   Assessment  & Plan :   Recurrent C. difficile colitis.  On Dificid, clinically resolved, no further diarrhea Flexi-Seal removed on 10/08/2022, continue Dificid, stop date 10/28/2022.  Aspiration pneumonia causing PEA arrest, acute hypoxic respiratory failure requiring endotracheal intubation followed by self extubation.  Currently on supplemental oxygen, finished his antibiotics for aspiration pneumonia on 10/06/2022.  Currently on core track, ental status still not good enough for speech to advance diet, overall still has large amounts of oral secretions, coarse breath sounds and poor mental status making him at very high risk for repeat  aspiration, overall extremely poor prognosis, unfortunately family still wants to persist with continued medical treatment.  Continue oral suctioning, elevation of head of the bed, intermittent chest x-rays and monitor clinically.  Empiric antibiotics will be used with extreme caution due to history of recurrent C. difficile.  Echocardiogram gram done on the day of cardiac arrest showed a preserved EF around 65% without any wall motion abnormalities or acute changes.  Right-sided pneumothorax.  Chest tube being managed by PCCM.  Ch. tube removed on 10/08/2022.  Acute metabolic and toxic encephalopathy.  Due to #1 and 2 above.  Supportive care.  Currently requiring restraints to prevent self-harm.  Minimize narcotics and benzodiazepines.  Overall prognosis remains [place] poor, discussed in detail with daughter Daniel Cooke on 10/07/2022 on 10/09/2022, patient now DNR but continue medical treatment.  Shock liver post PEA arrest.  Trend stable will monitor.  Mild elevation in alk phos could be due to tube feeds.  Moderate protein calorie malnutrition.  Protein supplementation.  Dehydration with hypernatremia.  Free water increased further as hypernatremia is reoccurring  Hypertension.  In poor control added Norvasc close sinus bradycardia hence no beta-blocker.  Monitor and adjust..  Paroxysmal Afib - in ICU and now with aberrant conduction, TTE stable, [place] VASC 2 will be > 2 at least, On Eliquis, some episodes of S. Loletha Grayer ? Aberrant conduction, hold further B Blocker, appreciate cards input, likely no further changes needed.  Anemia.  Mild intermittent macrocytosis, monitor.  AKI on CKD 3B.  Baseline creatinine around 1.5.  Hydrate and monitor.  Urinary Retention - foley 10/07/22, Flomax once taking by mouth.  DM type II.  On Lantus along with every 4 sliding scale and every 4 NovoLog.  Monitor and adjust.  Lab Results  Component Value Date   HGBA1C 8.3 (H) 09/12/2022   CBG (last 3)  Recent  Labs    10/09/22 2338 10/10/22 0322 10/10/22 0814  GLUCAP 135* 200* 230*         Condition - Extremely Guarded  Family Communication  : Daughter over the phone on 10/07/2022 10/09/2022 now DNR  Code Status : DNR  Consults  : PCCM, palliative  care, cardiology  PUD Prophylaxis :        Disposition Plan  :    Status is: Inpatient  DVT Prophylaxis  :    apixaban (ELIQUIS) tablet 2.5 mg Start: 09/30/22 1000 apixaban (ELIQUIS) tablet 2.5 mg    Lab Results  Component Value Date   PLT 182 10/10/2022    Diet :  Diet Order             Diet NPO time specified  Diet effective now                    Inpatient Medications  Scheduled Meds:  amLODipine  10 mg Per Tube Daily   apixaban  2.5 mg Per Tube BID   bethanechol  5 mg Per Tube TID   Chlorhexidine Gluconate Cloth  6 each Topical Daily   cyanocobalamin  1,000 mcg Per Tube Daily   famotidine  10.4 mg Per Tube QODAY   feeding supplement (PROSource TF20)  45 mL Per Tube Daily   fidaxomicin  200 mg Per NG tube BID   folic acid  1 mg Per Tube Daily   free water  200 mL Per Tube Q4H   guaiFENesin  5 mL Per Tube Q12H   insulin aspart  0-15 Units Subcutaneous Q4H   insulin aspart  5 Units Subcutaneous Q4H   insulin glargine-yfgn  16 Units Subcutaneous Daily   mouth rinse  15 mL Mouth Rinse Q2H   scopolamine  1 patch Transdermal Q72H   sodium chloride flush  10 mL Intrapleural Q8H   Continuous Infusions:  sodium chloride Stopped (10/05/22 1236)   feeding supplement (OSMOLITE 1.5 CAL) 1,000 mL (10/09/22 0840)   PRN Meds:.sodium chloride, acetaminophen **OR** acetaminophen, albuterol, mouth rinse, polyvinyl alcohol   Objective:   Vitals:   10/09/22 2339 10/10/22 0315 10/10/22 0500 10/10/22 0804  BP: (!) 157/73 (!) 127/59  (!) 131/107  Pulse: 75 (!) 51  60  Resp: (!) 22 20  (!) 28  Temp: 98.2 F (36.8 C) 98.8 F (37.1 C)  99.3 F (37.4 C)  TempSrc: Oral Axillary  Oral  SpO2: 99% 99%  99%  Weight:    95.1 kg   Height:        Wt Readings from Last 3 Encounters:  10/10/22 95.1 kg  07/15/21 81.2 kg  05/20/21 81.6 kg     Intake/Output Summary (Last 24 hours) at 10/10/2022 1018 Last data filed at 10/09/2022 1937 Gross per 24 hour  Intake --  Output 975 ml  Net -975 ml     Physical Exam  Awake confused and agitated, restrained to the bed, core track in place, moves all 4 extremities Rigg[place].AT,PERRAL Supple Neck, No JVD,   Symmetrical Chest wall movement, coarse bilateral breath sounds with increased pulmonary secretions RRR,No Gallops, Rubs or new Murmurs,  +ve B.Sounds, Abd Soft, No tenderness,   No Cyanosis, Clubbing or edema     RN pressure injury documentation: Pressure Injury 09/30/22 Coccyx Mid Stage 2 -  Partial thickness loss of dermis presenting as a shallow open injury with a red, pink wound bed without slough. UTA (Active)  09/30/22 2000  Location: Coccyx  Location Orientation: Mid  Staging: Stage 2 -  Partial thickness loss of dermis presenting as a shallow open injury with a red, pink wound bed without slough.  Wound Description (Comments): UTA  Present on Admission:   Dressing Type Foam - Lift dressing to assess site every shift 10/09/22 2005  Data Review:    CBC Recent Labs  Lab 10/06/22 0402 10/07/22 8469 10/08/22 0516 10/08/22 0903 10/09/22 0735 10/10/22 0316  WBC 12.0* 12.5* 8.8 12.5* 12.1* 14.4*  HGB 10.7* 11.2* 9.2* 10.5* 10.6* 10.5*  HCT 34.2* 36.0* 31.5* 33.1* 34.5* 33.7*  PLT 220 228 211 213 236 182  MCV 100.9* 102.3* 109.0* 102.5* 104.2* 103.4*  MCH 31.6 31.8 31.8 32.5 32.0 32.2  MCHC 31.3 31.1 29.2* 31.7 30.7 31.2  RDW 14.7 15.0 15.4 15.0 15.0 15.0  LYMPHSABS 1.4  --  1.2 1.7 1.1 1.4  MONOABS 0.7  --  0.5 0.7 0.5 0.6  EOSABS 0.2  --  0.2 0.2 0.1 0.1  BASOSABS 0.1  --  0.1 0.1 0.1 0.1    Electrolytes Recent Labs  Lab 10/04/22 0536 10/05/22 0517 10/06/22 0402 10/07/22 0841 10/07/22 0842 10/08/22 0516 10/08/22 0903  10/08/22 1235 10/09/22 0735 10/10/22 0316 10/10/22 0648  NA 143   < > 146*  --  150*  --  145  --  146* 145  --   K 3.1*   < > 3.4*  --  4.1  --  4.7  --  4.8 5.0  --   CL 109   < > 111  --  117*  --  111  --  113* 109  --   CO2 25   < > 25  --  23  --  26  --  25 24  --   GLUCOSE 128*   < > 266*  --  150*  --  258*  --  220* 200*  --   BUN 74*   < > 78*  --  73*  --  67*  --  71* 74*  --   CREATININE 1.89*   < > 1.96*  --  1.82*  --  1.69*  --  1.88* 1.80*  --   CALCIUM 8.6*   < > 9.4  --  9.5  --  9.3  --  9.6 9.5  --   AST 31  --  49*  --   --   --  57*  --  63* 59*  --   ALT 26  --  38  --   --   --  43  --  45* 44  --   ALKPHOS 88  --  153*  --   --   --  133*  --  146* 146*  --   BILITOT 1.2  --  1.4*  --   --   --  1.3*  --  1.3* 1.6*  --   ALBUMIN 1.9*  --  2.1*  --   --   --  2.2*  --  2.2* 2.2*  --   MG  --   --   --   --  2.3  --  2.2  --  2.3 2.3  --   PROCALCITON  --   --   --  0.48  --   --   --  0.33 0.27  --  0.26  BNP  --   --   --   --   --  649.7*  --   --  753.1* 496.9*  --    < > = values in this interval not displayed.    Radiology Reports  DG Chest Port 1 View  Result Date: 10/10/2022 CLINICAL DATA:  Short of breath EXAM: PORTABLE CHEST 1 VIEW COMPARISON:  Prior chest x-ray 10/09/2022  FINDINGS: Limited visualization of enteric feeding tube. Extremely low inspiratory volumes with increased bibasilar airspace opacities. Diffuse chronic bronchitic changes are similar compared to prior. Stable cardiomegaly. IMPRESSION: 1. Increasing bibasilar airspace opacities favored to reflect atelectasis, possibly with small bilateral layering pleural effusions. 2. Stable cardiomegaly. 3. Partially imaged feeding tube. Electronically Signed   By: Jacqulynn Cadet M.D.   On: 10/10/2022 07:41   DG Abd Portable 1V  Result Date: 10/09/2022 CLINICAL DATA:  Shortness of breath and constipation. EXAM: PORTABLE ABDOMEN - 1 VIEW COMPARISON:  10/07/2022 FINDINGS: Unchanged position of  the enteric tube with tip projecting over the mid upper abdomen in the expected location of the gastric body. Bowel gas pattern is nonobstructed. Normal stool burden. Signs of previous cholecystectomy and vertebral augmentation. IMPRESSION: Stable position of enteric tube with tip projecting over the gastric body. Nonobstructive bowel gas pattern. Electronically Signed   By: Kerby Moors M.D.   On: 10/09/2022 07:02   DG Chest Port 1 View  Result Date: 10/09/2022 CLINICAL DATA:  Shortness of breath. Altered mental status. Constipation. EXAM: PORTABLE CHEST 1 VIEW COMPARISON:  10/08/22 FINDINGS: There is a enteric tube with tip coursing below the hemidiaphragms. Heart size and mediastinal contours are stable. Right-sided thoracostomy tube has been removed. There is diffuse pulmonary vascular congestion. Atelectasis is noted within the lung bases, left greater than right. IMPRESSION: 1. Interval removal of right thoracostomy tube. No pneumothorax. 2. Pulmonary vascular congestion. 3. Bibasilar atelectasis, left greater than right. Electronically Signed   By: Kerby Moors M.D.   On: 10/09/2022 07:01   DG Chest Port 1 View  Result Date: 10/08/2022 CLINICAL DATA:  Shortness of breath, chest tube EXAM: PORTABLE CHEST 1 VIEW COMPARISON:  Radiograph 10/07/2022 FINDINGS: Unchanged cardiomediastinal silhouette. Feeding tube passes below the diaphragm, tip excluded by collimation. Right pigtail chest tube has migrated inferiorly towards the base of the hemithorax. There are mild diffuse interstitial opacities. Stable small pleural effusions. Persistent left basilar airspace disease, not significantly changed. No evidence of pneumothorax. Bones are unchanged. IMPRESSION: Right pigtail chest tube overlies the right lower hemithorax. No evidence of pneumothorax. Interstitial pulmonary edema.  Stable small pleural effusions. Persistent left basilar airspace disease, not significantly changed from prior. Electronically  Signed   By: Maurine Simmering M.D.   On: 10/08/2022 08:41   DG Chest Port 1 View  Result Date: 10/07/2022 CLINICAL DATA:  Follow-up pneumothorax EXAM: PORTABLE CHEST 1 VIEW COMPARISON:  10/07/2022 FINDINGS: Pigtail catheter is again noted on the right. No definitive pneumothorax is seen. Feeding catheter extends into the stomach. Cardiac shadow is within normal limits. Patchy increased airspace opacity is noted in the left base relatively stable from the prior exam. No new focal abnormality is seen. IMPRESSION: Overall stable appearance of the chest.  No pneumothorax is noted. Electronically Signed   By: Inez Catalina M.D.   On: 10/07/2022 19:17   DG Chest Port 1 View  Result Date: 10/07/2022 CLINICAL DATA:  Shortness of breath.  Chest tube. EXAM: PORTABLE CHEST 1 VIEW COMPARISON:  Earlier same day chest radiograph at 0511 hours; CT chest 09/28/2022 FINDINGS: Interval retraction of the right pigtail pleural drainage catheter which now projects at the lateral aspect of the right lower hemithorax. Enteric tube tip is below the diaphragm but not included on the image. Persistent dense retrocardiac airspace opacity and interstitial opacities in the left mid and lower lung. Small left pleural effusion. No pneumothorax. Stable cardiomediastinal silhouette. Aortic calcifications. Surgical clips overlie the right chest wall  as well as the right upper abdomen. Changes of vertebral body augmentation of the upper lumbar spine. IMPRESSION: Interval retraction of the right chest tube which is now located at the lateral aspect of the lower right hemithorax. The pigtail projects within the thoracic cavity. No pneumothorax. Unchanged left basilar airspace opacities, likely reflecting combination of small left pleural effusion and atelectasis, though superimposed infection cannot be excluded. Electronically Signed   By: Ileana Roup M.D.   On: 10/07/2022 14:46   DG Abd Portable 1V  Result Date: 10/07/2022 CLINICAL DATA:   Nausea EXAM: PORTABLE ABDOMEN - 1 VIEW COMPARISON:  10/05/2022 FINDINGS: Soft feeding tube tip is in the antrum of the stomach. The bowel gas pattern is normal without evidence of ileus or obstruction. Normal amount of fecal matter. Old spinal augmentation. Previous cholecystectomy. IMPRESSION: Soft feeding tube tip in the antrum of the stomach. Electronically Signed   By: Nelson Chimes M.D.   On: 10/07/2022 11:47   DG CHEST PORT 1 VIEW  Result Date: 10/07/2022 CLINICAL DATA:  Pneumothorax. EXAM: PORTABLE CHEST 1 VIEW COMPARISON:  October 05, 2022 FINDINGS: Since the previous exam there has been interval removal of a LEFT sided venous catheter. RIGHT-sided chest tube remains in place. Feeding tube courses through in off the field of the radiograph placed since previous imaging. EKG leads project over the chest. Cardiomediastinal contours and hilar structures are stable. There is a persistent amount of subcutaneous emphysema about the chest which appears mildly decreased. No visible pneumothorax. LEFT basilar airspace disease is similar to recent imaging likely associated with small LEFT pleural effusion. On limited assessment no acute skeletal findings. IMPRESSION: 1. Interval removal of LEFT-sided venous catheter. 2. RIGHT-sided chest tube remains in place. No visible pneumothorax. 3. Persistent LEFT basilar airspace disease likely associated with small LEFT pleural effusion. 4. Decreased subcutaneous emphysema. Electronically Signed   By: Zetta Bills M.D.   On: 10/07/2022 08:17      Signature  Lala Lund M.D on 10/10/2022 at 10:18 AM   -  To page go to [email or web address]

## 2022-10-11 ENCOUNTER — Inpatient Hospital Stay (HOSPITAL_COMMUNITY): Payer: Medicare Other

## 2022-10-11 DIAGNOSIS — G9341 Metabolic encephalopathy: Secondary | ICD-10-CM | POA: Diagnosis not present

## 2022-10-11 DIAGNOSIS — J9601 Acute respiratory failure with hypoxia: Secondary | ICD-10-CM | POA: Diagnosis not present

## 2022-10-11 DIAGNOSIS — N179 Acute kidney failure, unspecified: Secondary | ICD-10-CM | POA: Diagnosis not present

## 2022-10-11 DIAGNOSIS — A0472 Enterocolitis due to Clostridium difficile, not specified as recurrent: Secondary | ICD-10-CM | POA: Diagnosis not present

## 2022-10-11 LAB — COMPREHENSIVE METABOLIC PANEL
ALT: 36 U/L (ref 0–44)
AST: 56 U/L — ABNORMAL HIGH (ref 15–41)
Albumin: 2.1 g/dL — ABNORMAL LOW (ref 3.5–5.0)
Alkaline Phosphatase: 144 U/L — ABNORMAL HIGH (ref 38–126)
Anion gap: 8 (ref 5–15)
BUN: 80 mg/dL — ABNORMAL HIGH (ref 8–23)
CO2: 27 mmol/L (ref 22–32)
Calcium: 9.5 mg/dL (ref 8.9–10.3)
Chloride: 111 mmol/L (ref 98–111)
Creatinine, Ser: 1.88 mg/dL — ABNORMAL HIGH (ref 0.61–1.24)
GFR, Estimated: 32 mL/min — ABNORMAL LOW (ref >60)
Glucose, Bld: 172 mg/dL — ABNORMAL HIGH (ref 70–99)
Potassium: 5.7 mmol/L — ABNORMAL HIGH (ref 3.5–5.1)
Sodium: 146 mmol/L — ABNORMAL HIGH (ref 135–145)
Total Bilirubin: 1.5 mg/dL — ABNORMAL HIGH (ref 0.3–1.2)
Total Protein: 4.9 g/dL — ABNORMAL LOW (ref 6.5–8.1)

## 2022-10-11 LAB — CBC WITH DIFFERENTIAL/PLATELET
Abs Immature Granulocytes: 0.04 10*3/uL (ref 0.00–0.07)
Basophils Absolute: 0.1 10*3/uL (ref 0.0–0.1)
Basophils Relative: 1 %
Eosinophils Absolute: 0.1 10*3/uL (ref 0.0–0.5)
Eosinophils Relative: 1 %
HCT: 32.1 % — ABNORMAL LOW (ref 39.0–52.0)
Hemoglobin: 10.1 g/dL — ABNORMAL LOW (ref 13.0–17.0)
Immature Granulocytes: 0 %
Lymphocytes Relative: 15 %
Lymphs Abs: 1.6 10*3/uL (ref 0.7–4.0)
MCH: 32.3 pg (ref 26.0–34.0)
MCHC: 31.5 g/dL (ref 30.0–36.0)
MCV: 102.6 fL — ABNORMAL HIGH (ref 80.0–100.0)
Monocytes Absolute: 0.8 10*3/uL (ref 0.1–1.0)
Monocytes Relative: 7 %
Neutro Abs: 8 10*3/uL — ABNORMAL HIGH (ref 1.7–7.7)
Neutrophils Relative %: 76 %
Platelets: 243 10*3/uL (ref 150–400)
RBC: 3.13 MIL/uL — ABNORMAL LOW (ref 4.22–5.81)
RDW: 15.2 % (ref 11.5–15.5)
WBC: 10.6 10*3/uL — ABNORMAL HIGH (ref 4.0–10.5)
nRBC: 0 % (ref 0.0–0.2)

## 2022-10-11 LAB — GLUCOSE, CAPILLARY
Glucose-Capillary: 136 mg/dL — ABNORMAL HIGH (ref 70–99)
Glucose-Capillary: 140 mg/dL — ABNORMAL HIGH (ref 70–99)
Glucose-Capillary: 145 mg/dL — ABNORMAL HIGH (ref 70–99)
Glucose-Capillary: 149 mg/dL — ABNORMAL HIGH (ref 70–99)
Glucose-Capillary: 226 mg/dL — ABNORMAL HIGH (ref 70–99)
Glucose-Capillary: 61 mg/dL — ABNORMAL LOW (ref 70–99)

## 2022-10-11 LAB — PROCALCITONIN: Procalcitonin: 0.31 ng/mL

## 2022-10-11 LAB — MAGNESIUM: Magnesium: 2.5 mg/dL — ABNORMAL HIGH (ref 1.7–2.4)

## 2022-10-11 LAB — BRAIN NATRIURETIC PEPTIDE: B Natriuretic Peptide: 562.3 pg/mL — ABNORMAL HIGH (ref 0.0–100.0)

## 2022-10-11 MED ORDER — FUROSEMIDE 10 MG/ML IJ SOLN
40.0000 mg | Freq: Once | INTRAMUSCULAR | Status: AC
  Administered 2022-10-11: 40 mg via INTRAVENOUS
  Filled 2022-10-11: qty 4

## 2022-10-11 MED ORDER — SODIUM ZIRCONIUM CYCLOSILICATE 10 G PO PACK
10.0000 g | PACK | Freq: Three times a day (TID) | ORAL | Status: AC
  Administered 2022-10-11 (×3): 10 g
  Filled 2022-10-11 (×3): qty 1

## 2022-10-11 NOTE — Progress Notes (Addendum)
PROGRESS NOTE                                                                                                                                                                                                             Patient Demographics:    Daniel Cooke, is a 88 y.o. male, DOB - Nov 05, 1924, GYI:[id number]  Outpatient Primary MD for the patient is Daniel Smoker, MD    LOS - 41  Admit date - 09/25/2022    Chief Complaint  Patient presents with   Back Pain       Brief Narrative (HPI from H&P)   88 y/o M with hx of CKD 3B, HLD, DM2, Afib, HTN,  but otherwise fully functional who presented with recurrent colitis found to have C difficile infection.  Hospital course complicated by delirium.  Unfortunately he had what seems to be a aspiration event then had cardiac arrest.  He was admitted on 09/25/2022 and intubated for aspiration pneumonia related complications and cardiac arrest on 09/28/2022, he self extubated himself on 10/04/2022.  He also developed pneumothorax in ICU requiring a right-sided chest tube placement on 09/29/2022 which he still has, for his ongoing dysphagia and aspiration he has a core track tube, currently encephalopathic and restrained to the bed, overall prognosis poor, transferred to my service on 10/07/2022 on day 12 of his hospital stay.    Procedures  :     10/13 admit with C. Diff 10/16 cardiac arrest 10/16 echocardiogram as below 10/17 pneumothorax> chest tube placed. EEG with burst suppression  10/20 SBT but mental status precludes extubation. EEG/MRI negative.  10/21 Eyes open, squeezed left hand to command 10/22 Self Extubated, mildly agitated, on 2 L [place], required NTS for secretion management 10/23 Cortrack placed for feedings 10/07/2022 transferred to hospitalist service    TTE -  1. Left ventricular ejection fraction, by estimation, is 65 to 70%. The left ventricle has normal  function. The left ventricle has no regional wall motion abnormalities. Left ventricular diastolic parameters are indeterminate. There is the  interventricular septum is flattened in systole, consistent with right ventricular pressure overload.   2. Right ventricular systolic function is mildly reduced. The right ventricular size is moderately enlarged. There is moderately elevated pulmonary artery systolic pressure. The estimated right ventricular systolic pressure is 35.0 mmHg.   3. Right atrial  size was severely dilated.   4. The mitral valve is grossly normal. Mild mitral valve regurgitation.   5. Tricuspid valve regurgitation is mild to moderate.   6. The aortic valve is tricuspid. There is severe calcifcation of the aortic valve. There is severe thickening of the aortic valve. Aortic valve regurgitation is not visualized. Aortic valve sclerosis/calcification is present, without any evidence of  aortic stenosis.   7. Aortic dilatation noted. There is borderline dilatation of the aortic root, measuring 37 mm.   8. The inferior vena cava is dilated in size with <50% respiratory variability, suggesting right atrial pressure of 15 mmHg.   Subjective:    Daniel Cooke today in bed restrained to bed confused unable to answer questions or follow commands   Assessment  & Plan :   Recurrent C. difficile colitis.  On Dificid, clinically resolved, no further diarrhea Flexi-Seal removed on 10/08/2022, continue Dificid, stop date 11/03/2022.  Aspiration pneumonia causing PEA arrest, acute hypoxic respiratory failure requiring endotracheal intubation followed by self extubation.  Currently on supplemental oxygen, finished his antibiotics for aspiration pneumonia on 10/06/2022.  Currently on core track, ental status still not good enough for speech to advance diet, overall still has large amounts of oral secretions, coarse breath sounds and poor mental status making him at very high risk for repeat  aspiration, overall extremely poor prognosis, unfortunately family still wants to persist with continued medical treatment.  Continue oral suctioning, elevation of head of the bed, intermittent chest x-rays and monitor clinically.  Empiric antibiotics will be used with extreme caution due to history of recurrent C. difficile.  Echocardiogram gram done on the day of cardiac arrest showed a preserved EF around 65% without any wall motion abnormalities or acute changes.  Right-sided pneumothorax.  Chest tube being managed by PCCM.  Ch. tube removed on 10/08/2022.  Acute metabolic and toxic encephalopathy.  Due to #1 and 2 above.  Supportive care.  Currently requiring restraints to prevent self-harm.  Minimize narcotics and benzodiazepines.  Overall prognosis remains [place] poor, discussed in detail with daughter Daniel Cooke on 10/07/2022 on 10/09/2022, patient now DNR.  Called her again on 10/11/2022 as patient is not progressing at all and in fact appears that he is actively suffering, daughter still wants to continue the present medical treatment including core track feeding, extensively counseled her.  I have reminded palliative care to see the patient again for goals of care discussion, they had seen the patient on 10/05/2022.  Shock liver post PEA arrest.  Trend stable will monitor.  Mild elevation in alk phos could be due to tube feeds.  Moderate protein calorie malnutrition.  Protein supplementation.  Dehydration with hypernatremia.  Free water increased further as hypernatremia is reoccurring  Hypertension.  In poor control added Norvasc close sinus bradycardia hence no beta-blocker.  Monitor and adjust..  Paroxysmal Afib - in ICU and now with aberrant conduction, TTE stable, [place] VASC 2 will be > 2 at least, On Eliquis, some episodes of S. Loletha Grayer ? Aberrant conduction, hold further B Blocker, appreciate cards input, likely no further changes needed.  Anemia.  Mild intermittent macrocytosis,  monitor.  AKI on CKD 3B.  Baseline creatinine around 1.5.  Hydrate and monitor.  Urinary Retention - foley 10/07/22, Flomax once taking by mouth.  DM type II.  On Lantus along with every 4 sliding scale and every 4 NovoLog.  Monitor and adjust.  Lab Results  Component Value Date   HGBA1C 8.3 (H) 09/12/2022  CBG (last 3)  Recent Labs    10/10/22 2315 10/11/22 0349 10/11/22 0752  GLUCAP 159* 140* 136*         Condition - Extremely Guarded  Family Communication  : Daughter over the phone on 10/07/2022, 10/09/2022 now DNR, 10/11/22  Code Status : DNR  Consults  : PCCM, palliative care, cardiology  PUD Prophylaxis :        Disposition Plan  :    Status is: Inpatient  DVT Prophylaxis  :    apixaban (ELIQUIS) tablet 2.5 mg Start: 09/30/22 1000 apixaban (ELIQUIS) tablet 2.5 mg    Lab Results  Component Value Date   PLT 243 10/11/2022    Diet :  Diet Order             Diet NPO time specified  Diet effective now                    Inpatient Medications  Scheduled Meds:  amLODipine  10 mg Per Tube Daily   apixaban  2.5 mg Per Tube BID   bethanechol  5 mg Per Tube TID   Chlorhexidine Gluconate Cloth  6 each Topical Daily   cyanocobalamin  1,000 mcg Per Tube Daily   famotidine  10.4 mg Per Tube QODAY   feeding supplement (PROSource TF20)  45 mL Per Tube Daily   fidaxomicin  200 mg Per NG tube BID   folic acid  1 mg Per Tube Daily   free water  200 mL Per Tube Q4H   guaiFENesin  5 mL Per Tube Q12H   insulin aspart  0-15 Units Subcutaneous Q4H   insulin aspart  5 Units Subcutaneous Q4H   insulin glargine-yfgn  16 Units Subcutaneous Daily   mouth rinse  15 mL Mouth Rinse Q2H   scopolamine  1 patch Transdermal Q72H   sodium chloride flush  10 mL Intrapleural Q8H   sodium zirconium cyclosilicate  10 g Per Tube TID   Continuous Infusions:  sodium chloride Stopped (10/05/22 1236)   feeding supplement (OSMOLITE 1.5 CAL) 1,000 mL (10/11/22 0406)    PRN Meds:.sodium chloride, acetaminophen **OR** acetaminophen, albuterol, haloperidol lactate, morphine injection, mouth rinse, polyvinyl alcohol   Objective:   Vitals:   10/10/22 1432 10/10/22 1936 10/10/22 2312 10/11/22 0315  BP:  (!) 143/56 (!) 104/57 (!) 126/55  Pulse: 72 66 68 62  Resp: (!) _0 Temp:  98.9 F (37.2 C) 98.7 F (37.1 C) 98.6 F (37 C)  TempSrc:  Axillary Oral Oral  SpO2: 99% 98% 98% 99%  Weight:      Height:        Wt Readings from Last 3 Encounters:  10/10/22 95.1 kg  07/15/21 81.2 kg  05/20/21 81.6 kg     Intake/Output Summary (Last 24 hours) at 10/11/2022 0835 Last data filed at 10/10/2022 1847 Gross per 24 hour  Intake --  Output 350 ml  Net -350 ml     Physical Exam  Awake confused and agitated, restrained to the bed, core track in place, moves all 4 extremities [place].AT,PERRAL Supple Neck, No JVD,   Symmetrical Chest wall movement, Good air movement bilaterally, CTAB RRR,No Gallops, Rubs or new Murmurs,  +ve B.Sounds, Abd Soft, No tenderness,   No Cyanosis, Clubbing or edema    RN pressure injury documentation: Pressure Injury 09/30/22 Coccyx Mid Stage 2 -  Partial thickness loss of dermis presenting as a shallow open injury with a red, pink wound  bed without slough. UTA (Active)  09/30/22 2000  Location: Coccyx  Location Orientation: Mid  Staging: Stage 2 -  Partial thickness loss of dermis presenting as a shallow open injury with a red, pink wound bed without slough.  Wound Description (Comments): UTA  Present on Admission:   Dressing Type Foam - Lift dressing to assess site every shift 10/09/22 2005      Data Review:    CBC Recent Labs  Lab 10/08/22 0516 10/08/22 0903 10/09/22 0735 10/10/22 0316 10/11/22 0601  WBC 8.8 12.5* 12.1* 14.4* 10.6*  HGB 9.2* 10.5* 10.6* 10.5* 10.1*  HCT 31.5* 33.1* 34.5* 33.7* 32.1*  PLT 211 213 236 182 243  MCV 109.0* 102.5* 104.2* 103.4* 102.6*  MCH 31.8 32.5 32.0 32.2 32.3   MCHC 29.2* 31.7 30.7 31.2 31.5  RDW 15.4 15.0 15.0 15.0 15.2  LYMPHSABS 1.2 1.7 1.1 1.4 1.6  MONOABS 0.5 0.7 0.5 0.6 0.8  EOSABS 0.2 0.2 0.1 0.1 0.1  BASOSABS 0.1 0.1 0.1 0.1 0.1    Electrolytes Recent Labs  Lab 10/06/22 0402 10/07/22 0841 10/07/22 0842 10/08/22 0516 10/08/22 0903 10/08/22 1235 10/09/22 0735 10/10/22 0316 10/10/22 0648 10/11/22 0153  NA 146*  --  150*  --  145  --  146* 145  --  146*  K 3.4*  --  4.1  --  4.7  --  4.8 5.0  --  5.7*  CL 111  --  117*  --  111  --  113* 109  --  111  CO2 25  --  23  --  26  --  25 24  --  27  GLUCOSE 266*  --  150*  --  258*  --  220* 200*  --  172*  BUN 78*  --  73*  --  67*  --  71* 74*  --  80*  CREATININE 1.96*  --  1.82*  --  1.69*  --  1.88* 1.80*  --  1.88*  CALCIUM 9.4  --  9.5  --  9.3  --  9.6 9.5  --  9.5  AST 49*  --   --   --  57*  --  63* 59*  --  56*  ALT 38  --   --   --  43  --  45* 44  --  36  ALKPHOS 153*  --   --   --  133*  --  146* 146*  --  144*  BILITOT 1.4*  --   --   --  1.3*  --  1.3* 1.6*  --  1.5*  ALBUMIN 2.1*  --   --   --  2.2*  --  2.2* 2.2*  --  2.1*  MG  --   --  2.3  --  2.2  --  2.3 2.3  --  2.5*  PROCALCITON  --  0.48  --   --   --  0.33 0.27  --  0.26 0.31  BNP  --   --   --  649.7*  --   --  753.1* 496.9*  --  562.3*    Radiology Reports  DG Chest Port 1 View  Result Date: 10/11/2022 CLINICAL DATA:  88 year old male with history of shortness of breath. EXAM: PORTABLE CHEST 1 VIEW COMPARISON:  Chest x-ray 10/10/2022. FINDINGS: A feeding tube is seen extending into the abdomen, however, the tip of the feeding tube extends below the lower margin of the  image. Lung volumes are low. Bibasilar opacities which may reflect areas of atelectasis and/or consolidation, with superimposed moderate right and small left pleural effusions. No pneumothorax. There is cephalization of the pulmonary vasculature and slight indistinctness of the interstitial markings suggestive of mild pulmonary edema.  Mild cardiomegaly. The patient is rotated to the right on today's exam, resulting in distortion of the mediastinal contours and reduced diagnostic sensitivity and specificity for mediastinal pathology. Atherosclerotic calcifications are noted in the thoracic aorta. IMPRESSION: 1. The appearance the chest is suggestive of congestive heart failure, as above. 2. Aortic atherosclerosis. Electronically Signed   By: Vinnie Langton M.D.   On: 10/11/2022 06:28   DG Chest Port 1 View  Result Date: 10/10/2022 CLINICAL DATA:  Short of breath EXAM: PORTABLE CHEST 1 VIEW COMPARISON:  Prior chest x-ray 10/09/2022 FINDINGS: Limited visualization of enteric feeding tube. Extremely low inspiratory volumes with increased bibasilar airspace opacities. Diffuse chronic bronchitic changes are similar compared to prior. Stable cardiomegaly. IMPRESSION: 1. Increasing bibasilar airspace opacities favored to reflect atelectasis, possibly with small bilateral layering pleural effusions. 2. Stable cardiomegaly. 3. Partially imaged feeding tube. Electronically Signed   By: Jacqulynn Cadet M.D.   On: 10/10/2022 07:41   DG Abd Portable 1V  Result Date: 10/09/2022 CLINICAL DATA:  Shortness of breath and constipation. EXAM: PORTABLE ABDOMEN - 1 VIEW COMPARISON:  10/07/2022 FINDINGS: Unchanged position of the enteric tube with tip projecting over the mid upper abdomen in the expected location of the gastric body. Bowel gas pattern is nonobstructed. Normal stool burden. Signs of previous cholecystectomy and vertebral augmentation. IMPRESSION: Stable position of enteric tube with tip projecting over the gastric body. Nonobstructive bowel gas pattern. Electronically Signed   By: Kerby Moors M.D.   On: 10/09/2022 07:02   DG Chest Port 1 View  Result Date: 10/09/2022 CLINICAL DATA:  Shortness of breath. Altered mental status. Constipation. EXAM: PORTABLE CHEST 1 VIEW COMPARISON:  10/08/22 FINDINGS: There is a enteric tube with tip  coursing below the hemidiaphragms. Heart size and mediastinal contours are stable. Right-sided thoracostomy tube has been removed. There is diffuse pulmonary vascular congestion. Atelectasis is noted within the lung bases, left greater than right. IMPRESSION: 1. Interval removal of right thoracostomy tube. No pneumothorax. 2. Pulmonary vascular congestion. 3. Bibasilar atelectasis, left greater than right. Electronically Signed   By: Kerby Moors M.D.   On: 10/09/2022 07:01   DG Chest Port 1 View  Result Date: 10/08/2022 CLINICAL DATA:  Shortness of breath, chest tube EXAM: PORTABLE CHEST 1 VIEW COMPARISON:  Radiograph 10/07/2022 FINDINGS: Unchanged cardiomediastinal silhouette. Feeding tube passes below the diaphragm, tip excluded by collimation. Right pigtail chest tube has migrated inferiorly towards the base of the hemithorax. There are mild diffuse interstitial opacities. Stable small pleural effusions. Persistent left basilar airspace disease, not significantly changed. No evidence of pneumothorax. Bones are unchanged. IMPRESSION: Right pigtail chest tube overlies the right lower hemithorax. No evidence of pneumothorax. Interstitial pulmonary edema.  Stable small pleural effusions. Persistent left basilar airspace disease, not significantly changed from prior. Electronically Signed   By: Maurine Simmering M.D.   On: 10/08/2022 08:41   DG Chest Port 1 View  Result Date: 10/07/2022 CLINICAL DATA:  Follow-up pneumothorax EXAM: PORTABLE CHEST 1 VIEW COMPARISON:  10/07/2022 FINDINGS: Pigtail catheter is again noted on the right. No definitive pneumothorax is seen. Feeding catheter extends into the stomach. Cardiac shadow is within normal limits. Patchy increased airspace opacity is noted in the left base relatively stable from the  prior exam. No new focal abnormality is seen. IMPRESSION: Overall stable appearance of the chest.  No pneumothorax is noted. Electronically Signed   By: Inez Catalina M.D.   On:  10/07/2022 19:17   DG Chest Port 1 View  Result Date: 10/07/2022 CLINICAL DATA:  Shortness of breath.  Chest tube. EXAM: PORTABLE CHEST 1 VIEW COMPARISON:  Earlier same day chest radiograph at 0511 hours; CT chest 09/28/2022 FINDINGS: Interval retraction of the right pigtail pleural drainage catheter which now projects at the lateral aspect of the right lower hemithorax. Enteric tube tip is below the diaphragm but not included on the image. Persistent dense retrocardiac airspace opacity and interstitial opacities in the left mid and lower lung. Small left pleural effusion. No pneumothorax. Stable cardiomediastinal silhouette. Aortic calcifications. Surgical clips overlie the right chest wall as well as the right upper abdomen. Changes of vertebral body augmentation of the upper lumbar spine. IMPRESSION: Interval retraction of the right chest tube which is now located at the lateral aspect of the lower right hemithorax. The pigtail projects within the thoracic cavity. No pneumothorax. Unchanged left basilar airspace opacities, likely reflecting combination of small left pleural effusion and atelectasis, though superimposed infection cannot be excluded. Electronically Signed   By: Ileana Roup M.D.   On: 10/07/2022 14:46   DG Abd Portable 1V  Result Date: 10/07/2022 CLINICAL DATA:  Nausea EXAM: PORTABLE ABDOMEN - 1 VIEW COMPARISON:  10/05/2022 FINDINGS: Soft feeding tube tip is in the antrum of the stomach. The bowel gas pattern is normal without evidence of ileus or obstruction. Normal amount of fecal matter. Old spinal augmentation. Previous cholecystectomy. IMPRESSION: Soft feeding tube tip in the antrum of the stomach. Electronically Signed   By: Nelson Chimes M.D.   On: 10/07/2022 11:47      Signature  Lala Lund M.D on 10/11/2022 at 8:35 AM   -  To page go to [email or web address]

## 2022-10-11 NOTE — Progress Notes (Signed)
Palliative Medicine Progress Note   Patient Name: Daniel Cooke       Date: 10/11/2022 DOB: [birth year]  Age: 88 y.o. MRN#: [id number] Attending Physician: Thurnell Lose, MD Primary Care Physician: Glenis Smoker, MD Admit Date: 09/25/2022    HPI/Patient Profile: 88 year old man with hx of CKD, HLD, DM2 but otherwise fully functional who presented with recurrent colitis found to have C difficile infection.  Hospital course complicated by delirium.  Unfortunately had what seems to be a aspiration event then had cardiac arrest.  ROSC within 10 mins.    10/13 admit with C. Diff. 10/16 cardiac arrest 10/17 pneumothorax> chest tube placed. EEG with burst suppression.  10/20 SBT but mental status precludes extubation. EEG/MRI negative.  10/21 Eyes open, squeezed left hand to command 10/22 Self Extubated, mildly agitated, on 2 L [place], required NTS for secretion management 10/23 Cortrack placed for feedings 10/25 transferred out of ICU to MedSurg unit   Subjective: This NP was contacted by Dr. Candiss Norse and asked to re-engage with patient's family regarding goals of care.   I went to see patient at bedside. He is restless and appears generally uncomfortable. He is awake and tracks me, but does not verbally respond to my questions. He remains in bilateral soft mittens. He has audible respiratory congestion.   I spoke with daughters [name] and [name] (separately) by phone. Continued conversation was had about Mr. [place] poor overall prognosis. Discussed that he is not progressing and appears to be very uncomfortable. I shared my concern that he was in a terminal situation.  The difference between full scope medical intervention and comfort care was considered.  Reviewed the concept of a  comfort path to family, emphasizing that this  involves de-escalating and stopping full scope medical interventions, allowing a natural course to occur. Discussed that the goal is comfort and dignity rather than cure/prolonging life. Both daughters are emotional and struggling with this decision. They express not wanting him to continue in his current states, but also don't want to "let him go". They ultimately are not ready to transition to comfort care at this time. However, they are agreeable to low-dose pain medication to alleviate some of his discomfort.     Objective:  Physical Exam Vitals reviewed.  Constitutional:      Appearance: He is  ill-appearing.     Comments: awake  Neurological:     Motor: Weakness present.  Psychiatric:        Speech: He is noncommunicative.     Comments: restless             Vital Signs: BP (!) 126/55 (BP Location: Right Arm)   Pulse 62   Temp 98.6 F (37 C) (Oral)   Resp 18   Ht 5\' 10"  (1.778 m)   Wt 95.1 kg   SpO2 99%   BMI 30.08 kg/m  SpO2: SpO2: 99 % O2 Device: O2 Device: Nasal Cannula O2 Flow Rate: O2 Flow Rate (L/min): 2 L/min   LBM: Last BM Date : 10/09/22     Palliative Assessment/Data: ***     Palliative Medicine Assessment & Plan   Assessment: Principal Problem:   C. difficile colitis Active Problems:   Anemia of chronic disease   HTN (hypertension)   HLD (hyperlipidemia)   Type 2 diabetes mellitus with stage 3 chronic kidney disease, without long-term current use of insulin (HCC)   Persistent atrial fibrillation (HCC)   Leukocytosis   SIRS (systemic inflammatory response syndrome) (HCC)   Acute metabolic encephalopathy   Renal insufficiency   Compression fracture of body of thoracic vertebra (HCC)   GERD (gastroesophageal reflux disease)   Elevated troponin   Pressure injury of skin   Acute respiratory failure with hypoxia (HCC)   Pneumothorax, traumatic    Recommendations/Plan: DNR/DNI as previously  documented Continue current interventions for now Fentanyl 12.5-25 mcg every 2 hours as needed for pain or increased work of breathing PMT will follow up tomorrow  Goals of Care and Additional Recommendations: Limitations on Scope of Treatment: {Recommended Scope and Preferences:21019}  Code Status:   Prognosis:  {Palliative Care Prognosis:23504}  Discharge Planning: {Palliative dispostion:23505}  Care plan was discussed with ***  Thank you for allowing the Palliative Medicine Team to assist in the care of this patient.   ***   Lavena Bullion, NP   Please contact Palliative Medicine Team phone at [phone] for questions and concerns.  For individual providers, please see AMION.

## 2022-10-12 DIAGNOSIS — N179 Acute kidney failure, unspecified: Secondary | ICD-10-CM | POA: Diagnosis not present

## 2022-10-12 DIAGNOSIS — I4819 Other persistent atrial fibrillation: Secondary | ICD-10-CM | POA: Diagnosis not present

## 2022-10-12 DIAGNOSIS — G9341 Metabolic encephalopathy: Secondary | ICD-10-CM | POA: Diagnosis not present

## 2022-10-12 DIAGNOSIS — A0472 Enterocolitis due to Clostridium difficile, not specified as recurrent: Secondary | ICD-10-CM | POA: Diagnosis not present

## 2022-10-12 LAB — CBC WITH DIFFERENTIAL/PLATELET
Abs Immature Granulocytes: 0.03 10*3/uL (ref 0.00–0.07)
Basophils Absolute: 0.1 10*3/uL (ref 0.0–0.1)
Basophils Relative: 1 %
Eosinophils Absolute: 0.1 10*3/uL (ref 0.0–0.5)
Eosinophils Relative: 1 %
HCT: 31.5 % — ABNORMAL LOW (ref 39.0–52.0)
Hemoglobin: 9.7 g/dL — ABNORMAL LOW (ref 13.0–17.0)
Immature Granulocytes: 0 %
Lymphocytes Relative: 16 %
Lymphs Abs: 1.5 10*3/uL (ref 0.7–4.0)
MCH: 31.8 pg (ref 26.0–34.0)
MCHC: 30.8 g/dL (ref 30.0–36.0)
MCV: 103.3 fL — ABNORMAL HIGH (ref 80.0–100.0)
Monocytes Absolute: 0.7 10*3/uL (ref 0.1–1.0)
Monocytes Relative: 8 %
Neutro Abs: 6.9 10*3/uL (ref 1.7–7.7)
Neutrophils Relative %: 74 %
Platelets: 240 10*3/uL (ref 150–400)
RBC: 3.05 MIL/uL — ABNORMAL LOW (ref 4.22–5.81)
RDW: 15.2 % (ref 11.5–15.5)
WBC: 9.3 10*3/uL (ref 4.0–10.5)
nRBC: 0 % (ref 0.0–0.2)

## 2022-10-12 LAB — COMPREHENSIVE METABOLIC PANEL
ALT: 39 U/L (ref 0–44)
AST: 59 U/L — ABNORMAL HIGH (ref 15–41)
Albumin: 2 g/dL — ABNORMAL LOW (ref 3.5–5.0)
Alkaline Phosphatase: 145 U/L — ABNORMAL HIGH (ref 38–126)
Anion gap: 11 (ref 5–15)
BUN: 85 mg/dL — ABNORMAL HIGH (ref 8–23)
CO2: 27 mmol/L (ref 22–32)
Calcium: 9.3 mg/dL (ref 8.9–10.3)
Chloride: 108 mmol/L (ref 98–111)
Creatinine, Ser: 1.96 mg/dL — ABNORMAL HIGH (ref 0.61–1.24)
GFR, Estimated: 31 mL/min — ABNORMAL LOW (ref >60)
Glucose, Bld: 119 mg/dL — ABNORMAL HIGH (ref 70–99)
Potassium: 4.9 mmol/L (ref 3.5–5.1)
Sodium: 146 mmol/L — ABNORMAL HIGH (ref 135–145)
Total Bilirubin: 1.3 mg/dL — ABNORMAL HIGH (ref 0.3–1.2)
Total Protein: 4.8 g/dL — ABNORMAL LOW (ref 6.5–8.1)

## 2022-10-12 LAB — GLUCOSE, CAPILLARY
Glucose-Capillary: 155 mg/dL — ABNORMAL HIGH (ref 70–99)
Glucose-Capillary: 157 mg/dL — ABNORMAL HIGH (ref 70–99)
Glucose-Capillary: 178 mg/dL — ABNORMAL HIGH (ref 70–99)
Glucose-Capillary: 153 mg/dL — ABNORMAL HIGH (ref 70–99)
Glucose-Capillary: 108 mg/dL — ABNORMAL HIGH (ref 70–99)
Glucose-Capillary: 111 mg/dL — ABNORMAL HIGH (ref 70–99)

## 2022-10-12 LAB — PROCALCITONIN: Procalcitonin: 0.25 ng/mL

## 2022-10-12 NOTE — Progress Notes (Signed)
PROGRESS NOTE                                                                                                                                                                                                             Patient Demographics:    Daniel Cooke, is a 88 y.o. male, DOB - 10/12/1924, GYJ:[id number]  Outpatient Primary MD for the patient is Daniel Smoker, MD    LOS - 79  Admit date - 09/25/2022    Chief Complaint  Patient presents with   Back Pain       Brief Narrative (HPI from H&P)   88 y/o M with hx of CKD 3B, HLD, DM2, Afib, HTN,  but otherwise fully functional who presented with recurrent colitis found to have C difficile infection.  Hospital course complicated by delirium.  Unfortunately he had what seems to be a aspiration event then had cardiac arrest.  He was admitted on 09/25/2022 and intubated for aspiration pneumonia related complications and cardiac arrest on 09/28/2022, he self extubated himself on 10/04/2022.  He also developed pneumothorax in ICU requiring a right-sided chest tube placement on 09/29/2022 which he still has, for his ongoing dysphagia and aspiration he has a core track tube, currently encephalopathic and restrained to the bed, overall prognosis poor, transferred to my service on 10/07/2022 on day 12 of his hospital stay.    Procedures  :     10/13 admit with C. Diff 10/16 cardiac arrest 10/16 echocardiogram as below 10/17 pneumothorax> chest tube placed. EEG with burst suppression  10/20 SBT but mental status precludes extubation. EEG/MRI negative.  10/21 Eyes open, squeezed left hand to command 10/22 Self Extubated, mildly agitated, on 2 L [place], required NTS for secretion management 10/23 Cortrack placed for feedings 10/07/2022 transferred to hospitalist service   TTE -    1. Left ventricular ejection fraction, by estimation, is 65 to 70%. The left ventricle has normal  function. The left ventricle has no regional wall motion abnormalities. Left ventricular diastolic parameters are indeterminate. There is the  interventricular septum is flattened in systole, consistent with right ventricular pressure overload.   2. Right ventricular systolic function is mildly reduced. The right ventricular size is moderately enlarged. There is moderately elevated pulmonary artery systolic pressure. The estimated right ventricular systolic pressure is 26.3 mmHg.   3. Right  atrial size was severely dilated.   4. The mitral valve is grossly normal. Mild mitral valve regurgitation.   5. Tricuspid valve regurgitation is mild to moderate.   6. The aortic valve is tricuspid. There is severe calcifcation of the aortic valve. There is severe thickening of the aortic valve. Aortic valve regurgitation is not visualized. Aortic valve sclerosis/calcification is present, without any evidence of  aortic stenosis.   7. Aortic dilatation noted. There is borderline dilatation of the aortic root, measuring 37 mm.   8. The inferior vena cava is dilated in size with <50% respiratory variability, suggesting right atrial pressure of 15 mmHg.    Subjective:    Daniel Cooke today in bed restrained to bed confused unable to answer questions or follow commands.   Assessment  & Plan :   Recurrent C. difficile colitis.  On Dificid, clinically resolved, no further diarrhea Flexi-Seal removed on 10/08/2022, continue Dificid, stop date 10/23/2022.  Aspiration pneumonia causing PEA arrest, acute hypoxic respiratory failure requiring endotracheal intubation followed by self extubation.  Currently on supplemental oxygen, finished his antibiotics for aspiration pneumonia on 10/06/2022.  Currently on core track, ental status still not good enough for speech to advance diet, overall still has large amounts of oral secretions, coarse breath sounds and poor mental status making him at very high risk for repeat  aspiration, overall extremely poor prognosis, unfortunately family still wants to persist with continued medical treatment.  Continue oral suctioning, elevation of head of the bed, intermittent chest x-rays and monitor clinically.  Empiric antibiotics will be used with extreme caution due to history of recurrent C. difficile.  Echocardiogram gram done on the day of cardiac arrest showed a preserved EF around 65% without any wall motion abnormalities or acute changes.   Right-sided pneumothorax.  Chest tube being managed by PCCM.  Ch. tube removed on 10/08/2022.  Acute metabolic and toxic encephalopathy.  Due to #1 and 2 above.  Supportive care.  Currently requiring restraints to prevent self-harm.  Minimize narcotics and benzodiazepines.  Overall prognosis remains [place] poor, discussed in detail with daughter Daniel Cooke on 10/07/2022 on 10/09/2022, patient now DNR.  Called her again on 10/11/2022 as patient is not progressing at all and in fact appears that he is actively suffering, daughter still wants to continue the present medical treatment including core track feeding, extensively counseled her.  I have reminded palliative care to see the patient again for goals of care discussion, they had seen the patient on 10/05/2022.  Shock liver post PEA arrest.  Trend stable will monitor.  Mild elevation in alk phos could be due to tube feeds.  Moderate protein calorie malnutrition.  Protein supplementation.  Dehydration with hypernatremia.  Free water increased further as hypernatremia is reoccurring  Hypertension.  In poor control added Norvasc close sinus bradycardia hence no beta-blocker.  Monitor and adjust..  Paroxysmal Afib - in ICU and now with aberrant conduction, TTE stable, [place] VASC 2 will be > 2 at least, On Eliquis, some episodes of S. Loletha Grayer ? Aberrant conduction, hold further B Blocker, appreciate cards input, likely no further changes needed.  Anemia.  Mild intermittent macrocytosis,  monitor.  AKI on CKD 3B.  Baseline creatinine around 1.5.  Hydrate and monitor.  Urinary Retention - foley 10/07/22, Flomax once taking by mouth.  DM type II.  On Lantus along with every 4 sliding scale and every 4 NovoLog.  Monitor and adjust.  Lab Results  Component Value Date   HGBA1C 8.3 (H)  09/12/2022   CBG (last 3)  Recent Labs    10/11/22 2344 10/12/22 0330 10/12/22 0747  GLUCAP 61* 155* 157*         Condition - Extremely Guarded  Family Communication  : Daughter over the phone on 10/07/2022, 10/09/2022 now DNR, 10/11/22  Code Status : DNR  Consults  : PCCM, palliative care, cardiology  PUD Prophylaxis :        Disposition Plan  :    Status is: Inpatient  DVT Prophylaxis  :    apixaban (ELIQUIS) tablet 2.5 mg Start: 09/30/22 1000 apixaban (ELIQUIS) tablet 2.5 mg    Lab Results  Component Value Date   PLT 240 10/12/2022    Diet :  Diet Order             Diet NPO time specified  Diet effective now                    Inpatient Medications  Scheduled Meds:  amLODipine  10 mg Per Tube Daily   apixaban  2.5 mg Per Tube BID   bethanechol  5 mg Per Tube TID   Chlorhexidine Gluconate Cloth  6 each Topical Daily   cyanocobalamin  1,000 mcg Per Tube Daily   famotidine  10.4 mg Per Tube QODAY   feeding supplement (PROSource TF20)  45 mL Per Tube Daily   fidaxomicin  200 mg Per NG tube BID   folic acid  1 mg Per Tube Daily   free water  200 mL Per Tube Q4H   guaiFENesin  5 mL Per Tube Q12H   insulin aspart  0-15 Units Subcutaneous Q4H   insulin aspart  5 Units Subcutaneous Q4H   insulin glargine-yfgn  16 Units Subcutaneous Daily   mouth rinse  15 mL Mouth Rinse Q2H   scopolamine  1 patch Transdermal Q72H   Continuous Infusions:  sodium chloride Stopped (10/05/22 1236)   feeding supplement (OSMOLITE 1.5 CAL) 1,000 mL (10/12/22 0309)   PRN Meds:.sodium chloride, acetaminophen **OR** acetaminophen, albuterol, haloperidol lactate, morphine  injection, mouth rinse, polyvinyl alcohol   Objective:   Vitals:   10/11/22 1814 10/11/22 1948 10/11/22 2334 10/12/22 0749  BP: (!) 114/44 (!) 102/44 (!) 148/52 (!) 131/55  Pulse: 66 60 71 76  Resp: (!) _0 Temp:  98.5 F (36.9 C) 98.6 F (37 C) 98.4 F (36.9 C)  TempSrc:  Oral Oral Axillary  SpO2: 97% 91% 91% 92%  Weight:      Height:        Wt Readings from Last 3 Encounters:  10/10/22 95.1 kg  07/15/21 81.2 kg  05/20/21 81.6 kg     Intake/Output Summary (Last 24 hours) at 10/12/2022 0909 Last data filed at 10/11/2022 1700 Gross per 24 hour  Intake --  Output 1800 ml  Net -1800 ml     Physical Exam  Awake confused and agitated, restrained to the bed, core track in place, moves all 4 extremities [place].AT,PERRAL Supple Neck, No JVD,   Symmetrical Chest wall movement, Good air movement bilaterally, CTAB RRR,No Gallops, Rubs or new Murmurs,  +ve B.Sounds, Abd Soft, No tenderness,   No Cyanosis, Clubbing or edema     RN pressure injury documentation: Pressure Injury 09/30/22 Coccyx Mid Stage 2 -  Partial thickness loss of dermis presenting as a shallow open injury with a red, pink wound bed without slough. UTA (Active)  09/30/22 2000  Location: Coccyx  Location Orientation: Mid  Staging: Stage 2 -  Partial thickness loss of dermis presenting as a shallow open injury with a red, pink wound bed without slough.  Wound Description (Comments): UTA  Present on Admission:   Dressing Type Foam - Lift dressing to assess site every shift 10/09/22 2005      Data Review:    CBC Recent Labs  Lab 10/08/22 0903 10/09/22 0735 10/10/22 0316 10/11/22 0601 10/12/22 0222  WBC 12.5* 12.1* 14.4* 10.6* 9.3  HGB 10.5* 10.6* 10.5* 10.1* 9.7*  HCT 33.1* 34.5* 33.7* 32.1* 31.5*  PLT 213 236 182 243 240  MCV 102.5* 104.2* 103.4* 102.6* 103.3*  MCH 32.5 32.0 32.2 32.3 31.8  MCHC 31.7 30.7 31.2 31.5 30.8  RDW 15.0 15.0 15.0 15.2 15.2  LYMPHSABS 1.7 1.1 1.4 1.6 1.5   MONOABS 0.7 0.5 0.6 0.8 0.7  EOSABS 0.2 0.1 0.1 0.1 0.1  BASOSABS 0.1 0.1 0.1 0.1 0.1    Electrolytes Recent Labs  Lab 10/07/22 0842 10/08/22 0516 10/08/22 0903 10/08/22 1235 10/09/22 0735 10/10/22 0316 10/10/22 0648 10/11/22 0153 10/12/22 0222  NA 150*  --  145  --  146* 145  --  146* 146*  K 4.1  --  4.7  --  4.8 5.0  --  5.7* 4.9  CL 117*  --  111  --  113* 109  --  111 108  CO2 23  --  26  --  25 24  --  27 27  GLUCOSE 150*  --  258*  --  220* 200*  --  172* 119*  BUN 73*  --  67*  --  71* 74*  --  80* 85*  CREATININE 1.82*  --  1.69*  --  1.88* 1.80*  --  1.88* 1.96*  CALCIUM 9.5  --  9.3  --  9.6 9.5  --  9.5 9.3  AST  --   --  57*  --  63* 59*  --  56* 59*  ALT  --   --  43  --  45* 44  --  36 39  ALKPHOS  --   --  133*  --  146* 146*  --  144* 145*  BILITOT  --   --  1.3*  --  1.3* 1.6*  --  1.5* 1.3*  ALBUMIN  --   --  2.2*  --  2.2* 2.2*  --  2.1* 2.0*  MG 2.3  --  2.2  --  2.3 2.3  --  2.5*  --   PROCALCITON  --   --   --  0.33 0.27  --  0.26 0.31 0.25  BNP  --  649.7*  --   --  753.1* 496.9*  --  562.3*  --     Radiology Reports  DG Chest Port 1 View  Result Date: 10/11/2022 CLINICAL DATA:  88 year old male with history of shortness of breath. EXAM: PORTABLE CHEST 1 VIEW COMPARISON:  Chest x-ray 10/10/2022. FINDINGS: A feeding tube is seen extending into the abdomen, however, the tip of the feeding tube extends below the lower margin of the image. Lung volumes are low. Bibasilar opacities which may reflect areas of atelectasis and/or consolidation, with superimposed moderate right and small left pleural effusions. No pneumothorax. There is cephalization of the pulmonary vasculature and slight indistinctness of the interstitial markings suggestive of mild pulmonary edema. Mild cardiomegaly. The patient is rotated to the right on today's exam, resulting in distortion of the mediastinal contours and reduced  diagnostic sensitivity and specificity for mediastinal  pathology. Atherosclerotic calcifications are noted in the thoracic aorta. IMPRESSION: 1. The appearance the chest is suggestive of congestive heart failure, as above. 2. Aortic atherosclerosis. Electronically Signed   By: Vinnie Langton M.D.   On: 10/11/2022 06:28   DG Chest Port 1 View  Result Date: 10/10/2022 CLINICAL DATA:  Short of breath EXAM: PORTABLE CHEST 1 VIEW COMPARISON:  Prior chest x-ray 10/09/2022 FINDINGS: Limited visualization of enteric feeding tube. Extremely low inspiratory volumes with increased bibasilar airspace opacities. Diffuse chronic bronchitic changes are similar compared to prior. Stable cardiomegaly. IMPRESSION: 1. Increasing bibasilar airspace opacities favored to reflect atelectasis, possibly with small bilateral layering pleural effusions. 2. Stable cardiomegaly. 3. Partially imaged feeding tube. Electronically Signed   By: Jacqulynn Cadet M.D.   On: 10/10/2022 07:41   DG Abd Portable 1V  Result Date: 10/09/2022 CLINICAL DATA:  Shortness of breath and constipation. EXAM: PORTABLE ABDOMEN - 1 VIEW COMPARISON:  10/07/2022 FINDINGS: Unchanged position of the enteric tube with tip projecting over the mid upper abdomen in the expected location of the gastric body. Bowel gas pattern is nonobstructed. Normal stool burden. Signs of previous cholecystectomy and vertebral augmentation. IMPRESSION: Stable position of enteric tube with tip projecting over the gastric body. Nonobstructive bowel gas pattern. Electronically Signed   By: Kerby Moors M.D.   On: 10/09/2022 07:02   DG Chest Port 1 View  Result Date: 10/09/2022 CLINICAL DATA:  Shortness of breath. Altered mental status. Constipation. EXAM: PORTABLE CHEST 1 VIEW COMPARISON:  10/08/22 FINDINGS: There is a enteric tube with tip coursing below the hemidiaphragms. Heart size and mediastinal contours are stable. Right-sided thoracostomy tube has been removed. There is diffuse pulmonary vascular congestion. Atelectasis  is noted within the lung bases, left greater than right. IMPRESSION: 1. Interval removal of right thoracostomy tube. No pneumothorax. 2. Pulmonary vascular congestion. 3. Bibasilar atelectasis, left greater than right. Electronically Signed   By: Kerby Moors M.D.   On: 10/09/2022 07:01      Signature  Lala Lund M.D on 10/12/2022 at 9:09 AM   -  To page go to [email or web address]

## 2022-10-12 NOTE — TOC Progression Note (Signed)
Transition of Care [facility]) - Progression Note    Patient Details  Name: Daniel Cooke MRN: [id number] Date of Birth: [birth year]  [facility]) CM/SW [place], LCSW Phone Number: 10/12/2022, 8:48 AM  Clinical Narrative:    TOC continuing to follow for any potential needs.         Expected Discharge Plan and Services                                                 Social Determinants of Health (SDOH) Interventions    Readmission Risk Interventions    09/14/2022    1:30 PM  Readmission Risk Prevention Plan  Post Dischage Appt Complete  Medication Screening Complete  Transportation Screening Complete

## 2022-10-12 NOTE — Progress Notes (Signed)
Speech Language Pathology Treatment: Dysphagia  Patient Details Name: Daniel Cooke MRN: [id number] DOB: [birth year] Today's Date: 10/12/2022 Time: 0940-1000 SLP Time Calculation (min) (ACUTE ONLY): 20 min  Assessment / Plan / Recommendation Clinical Impression  Daniel Cooke shows no improvements with regard to swallowing.  He moaning in bed upon entering room.  Repositioned upright as much as he would tolerate.  Attempted to provide oral care, but he has a tendency to bite down on anything entering or nearing mouth ([place] equipment, spoon, washcloth), so oral care was limited. He accepted a few single ice chips, which he masticated.  Swallow response appeared to be delayed, followed by consistent coughing and moaning.  He expectorated thick, tan secretions, which I attempted to suction from mouth as he would allow. He did not follow commands nor respond verbally to clinician.   I called his daughter, Daniel Cooke, to introduce myself and update her about his overall swallowing, including concerns for his confusion and its obstacles for providing oral care and swallowing safely. We discussed the likelihood of aspiration of secretions and his overall poor progress with regard to swallowing. She verbalized understanding.  SLP will follow. D/W Dr. Candiss Cooke and Daniel Cooke nurse.    HPI HPI: 88 year old man with hx of CKD, HLD, DM2 but otherwise fully functional who presented with recurrent colitis found to have C difficile infection.  Hospital course complicated by delirium.  10/16 had possible aspiration event/ cardiac arrest.  ROSC within 10 mins. Intubated 10/16-22, when he self-extubated.  Swallow initially assessed 10/15.      SLP Plan  Continue with current plan of care      Recommendations for follow up therapy are one component of a multi-disciplinary discharge planning process, led by the attending physician.  Recommendations may be updated based on patient status, additional functional  criteria and insurance authorization.    Recommendations  Diet recommendations: NPO Medication Administration: Via alternative means                Oral Care Recommendations: Oral care QID Follow Up Recommendations: Skilled nursing-short term rehab (<3 hours/day) Assistance recommended at discharge: Frequent or constant Supervision/Assistance SLP Visit Diagnosis: Dysphagia, unspecified (R13.10) Plan: Continue with current plan of care        Daniel Cooke L. Daniel Ringer, MA CCC/SLP Clinical Specialist - Acute Care SLP Acute Rehabilitation Services Office number [phone]    Daniel Cooke Daniel Cooke  10/12/2022, 10:05 AM

## 2022-10-12 NOTE — Care Management Important Message (Signed)
Important Message  Patient Details  Name: Daniel Cooke MRN: [id number] Date of Birth: 1924/10/06   Medicare Important Message Given:  Yes     [name] 10/12/2022, 4:13 PM

## 2022-10-12 NOTE — TOC Progression Note (Signed)
Transition of Care [facility]) - Progression Note    Patient Details  Name: Daniel Cooke MRN: [id number] Date of Birth: [birth year]  Transition of Care [facility]) CM/SW [place], LCSW Phone Number: 10/12/2022, 8:47 AM  Clinical Narrative:    TOC continuing to follow for needs.        Expected Discharge Plan and Services                                                 Social Determinants of Health (SDOH) Interventions    Readmission Risk Interventions    09/14/2022    1:30 PM  Readmission Risk Prevention Plan  Post Dischage Appt Complete  Medication Screening Complete  Transportation Screening Complete

## 2022-10-12 NOTE — Progress Notes (Signed)
Palliative Medicine Progress Note   Patient Name: Daniel Cooke       Date: 10/12/2022 DOB: [birth year]  Age: 88 y.o. MRN#: [id number] Attending Physician: Leroy Sea, MD Primary Care Physician: Shon Hale, MD Admit Date: 09/25/2022  Reason for Consultation/Follow-up: {Reason for Consult:23484}  HPI/Patient Profile: 88 year old man with hx of CKD, HLD, DM2 but otherwise fully functional who presented with recurrent colitis found to have C difficile infection.  Hospital course complicated by delirium.  Unfortunately had what seems to be a aspiration event then had cardiac arrest.  ROSC within 10 mins.    10/13 admit with C. Diff. 10/16 cardiac arrest 10/17 pneumothorax> chest tube placed. EEG with burst suppression.  10/20 SBT but mental status precludes extubation. EEG/MRI negative.  10/21 Eyes open, squeezed left hand to command 10/22 Self Extubated, mildly agitated, on 2 L [place], required NTS for secretion management 10/23 Cortrack placed for feedings 10/25 transferred out of ICU to MedSurg unit  Subjective: Chart reviewed and patient assessed at bedside. He is restless and calling out, but speech is unintelligible.   Objective:  Physical Exam          Vital Signs: BP (!) 114/51 (BP Location: Left Arm)   Pulse 62   Temp 98.4 F (36.9 C) (Axillary)   Resp (!) 22   Ht 5\' 10"  (1.778 m)   Wt 95.1 kg   SpO2 98%   BMI 30.08 kg/m  SpO2: SpO2: 98 % O2 Device: O2 Device: Nasal Cannula O2 Flow Rate: O2 Flow Rate (L/min): 2 L/min     LBM: Last BM Date : 10/09/22     Palliative Assessment/Data: ***     Palliative Medicine Assessment & Plan   Assessment: Principal Problem:   C. difficile colitis Active Problems:   Anemia of chronic disease   HTN  (hypertension)   HLD (hyperlipidemia)   Type 2 diabetes mellitus with stage 3 chronic kidney disease, without long-term current use of insulin (HCC)   Persistent atrial fibrillation (HCC)   Leukocytosis   SIRS (systemic inflammatory response syndrome) (HCC)   Acute metabolic encephalopathy   Renal insufficiency   Compression fracture of body of thoracic vertebra (HCC)   GERD (gastroesophageal reflux disease)   Elevated troponin   Pressure injury of skin   Acute respiratory failure with  hypoxia (HCC)   Pneumothorax, traumatic    Recommendations/Plan: ***  Goals of Care and Additional Recommendations: Limitations on Scope of Treatment: {Recommended Scope and Preferences:21019}  Code Status:   Prognosis:  {Palliative Care Prognosis:23504}  Discharge Planning: {Palliative dispostion:23505}  Care plan was discussed with ***  Thank you for allowing the Palliative Medicine Team to assist in the care of this patient.   ***   Lavena Bullion, NP   Please contact Palliative Medicine Team phone at [phone] for questions and concerns.  For individual providers, please see AMION.

## 2022-10-13 ENCOUNTER — Inpatient Hospital Stay (HOSPITAL_COMMUNITY): Payer: Medicare Other

## 2022-10-13 DIAGNOSIS — R0600 Dyspnea, unspecified: Secondary | ICD-10-CM

## 2022-10-13 DIAGNOSIS — Z515 Encounter for palliative care: Secondary | ICD-10-CM | POA: Diagnosis not present

## 2022-10-13 DIAGNOSIS — R627 Adult failure to thrive: Secondary | ICD-10-CM | POA: Diagnosis not present

## 2022-10-13 DIAGNOSIS — N289 Disorder of kidney and ureter, unspecified: Secondary | ICD-10-CM | POA: Diagnosis not present

## 2022-10-13 DIAGNOSIS — A0472 Enterocolitis due to Clostridium difficile, not specified as recurrent: Secondary | ICD-10-CM | POA: Diagnosis not present

## 2022-10-13 DIAGNOSIS — G9341 Metabolic encephalopathy: Secondary | ICD-10-CM | POA: Diagnosis not present

## 2022-10-13 LAB — COMPREHENSIVE METABOLIC PANEL
ALT: 46 U/L — ABNORMAL HIGH (ref 0–44)
AST: 72 U/L — ABNORMAL HIGH (ref 15–41)
Albumin: 2 g/dL — ABNORMAL LOW (ref 3.5–5.0)
Alkaline Phosphatase: 218 U/L — ABNORMAL HIGH (ref 38–126)
Anion gap: 13 (ref 5–15)
BUN: 85 mg/dL — ABNORMAL HIGH (ref 8–23)
CO2: 28 mmol/L (ref 22–32)
Calcium: 9.2 mg/dL (ref 8.9–10.3)
Chloride: 103 mmol/L (ref 98–111)
Creatinine, Ser: 2.05 mg/dL — ABNORMAL HIGH (ref 0.61–1.24)
GFR, Estimated: 29 mL/min — ABNORMAL LOW (ref >60)
Glucose, Bld: 143 mg/dL — ABNORMAL HIGH (ref 70–99)
Potassium: 4.8 mmol/L (ref 3.5–5.1)
Sodium: 144 mmol/L (ref 135–145)
Total Bilirubin: 1.3 mg/dL — ABNORMAL HIGH (ref 0.3–1.2)
Total Protein: 5.1 g/dL — ABNORMAL LOW (ref 6.5–8.1)

## 2022-10-13 LAB — CBC
HCT: 30.3 % — ABNORMAL LOW (ref 39.0–52.0)
Hemoglobin: 9.5 g/dL — ABNORMAL LOW (ref 13.0–17.0)
MCH: 32.8 pg (ref 26.0–34.0)
MCHC: 31.4 g/dL (ref 30.0–36.0)
MCV: 104.5 fL — ABNORMAL HIGH (ref 80.0–100.0)
Platelets: 262 10*3/uL (ref 150–400)
RBC: 2.9 MIL/uL — ABNORMAL LOW (ref 4.22–5.81)
RDW: 14.8 % (ref 11.5–15.5)
WBC: 9 10*3/uL (ref 4.0–10.5)
nRBC: 0 % (ref 0.0–0.2)

## 2022-10-13 LAB — MAGNESIUM: Magnesium: 2.7 mg/dL — ABNORMAL HIGH (ref 1.7–2.4)

## 2022-10-13 LAB — GLUCOSE, CAPILLARY
Glucose-Capillary: 176 mg/dL — ABNORMAL HIGH (ref 70–99)
Glucose-Capillary: 179 mg/dL — ABNORMAL HIGH (ref 70–99)
Glucose-Capillary: 178 mg/dL — ABNORMAL HIGH (ref 70–99)

## 2022-10-13 LAB — BRAIN NATRIURETIC PEPTIDE: B Natriuretic Peptide: 1003 pg/mL — ABNORMAL HIGH (ref 0.0–100.0)

## 2022-10-13 MED ORDER — BIOTENE DRY MOUTH MT LIQD: 15.0000 mL | OROMUCOSAL | Status: DC | PRN

## 2022-10-13 MED ORDER — MORPHINE SULFATE (PF) 2 MG/ML IV SOLN
1.0000 mg | INTRAVENOUS | Status: DC | PRN
  Administered 2022-10-13 (×3): 1 mg via INTRAVENOUS
  Filled 2022-10-13 (×3): qty 1

## 2022-10-13 MED ORDER — GLYCOPYRROLATE 0.2 MG/ML IJ SOLN
0.2000 mg | INTRAMUSCULAR | Status: DC | PRN
  Administered 2022-10-13: 0.2 mg via INTRAVENOUS

## 2022-10-13 MED ORDER — GLYCOPYRROLATE 1 MG PO TABS: 1.0000 mg | ORAL_TABLET | ORAL | Status: DC | PRN

## 2022-10-13 MED ORDER — GLYCOPYRROLATE 0.2 MG/ML IJ SOLN
0.2000 mg | INTRAMUSCULAR | Status: DC | PRN
  Filled 2022-10-13: qty 1

## 2022-10-13 MED ORDER — SCOPOLAMINE 1 MG/3DAYS TD PT72: 1.0000 | MEDICATED_PATCH | TRANSDERMAL | 12 refills | Status: AC

## 2022-10-13 NOTE — Progress Notes (Addendum)
Manufacturing engineer [facility]) Hospital Liaison Note   Received request from [employer] [place] for family interest in [facility]. Spoke with daughter/Gayle to confirm interest and explain services.   Approval for [employer] is determined by [facility] MD. Once [facility] MD has determined [facility] eligibility, [place] will update hospital staff and family.   Addendum: Consent forms have been completed.   [facility] notified of patient D/C and transport arranged by TOC. Attending Physician/Dr. Candiss Norse also notified of  transport arrangement.    Please send signed DNR form with patient and RN call report to [phone].    Daphene Calamity, MSW [facility] Liaison [phone]

## 2022-10-13 NOTE — TOC Initial Note (Signed)
Transition of Care [facility]) - Initial/Assessment Note    Patient Details  Name: Daniel Cooke MRN: [id number] Date of Birth: 21-Oct-1924  [facility]) CM/SW Contact:    Benard Halsted, LCSW Phone Number: 10/13/2022, 1:13 PM  Clinical Narrative:                 CSW received consult for residential hospice placement. CSW sent referral to [facility] for review, as requested by daughters.   Expected Discharge Plan: [place] Barriers to Discharge: Hospice Bed not available   Patient Goals and CMS Choice Patient states their goals for this hospitalization and ongoing recovery are:: Comfort      Expected Discharge Plan and Services Expected Discharge Plan: [place]                                              Prior Living Arrangements/Services     Patient language and need for interpreter reviewed:: Yes Do you feel safe going back to the place where you live?: Yes      Need for Family Participation in Patient Care: Yes (Comment) Care giver support system in place?: Yes (comment)   Criminal Activity/Legal Involvement Pertinent to Current Situation/Hospitalization: No - Comment as needed  Activities of Daily Living      Permission Sought/Granted Permission sought to share information with : Facility Sport and exercise psychologist, Family Supports Permission granted to share information with : Yes, Verbal Permission Granted  Share Information with NAME: [name]  Permission granted to share info w AGENCY: Hospice  Permission granted to share info w Relationship: Daugher  Permission granted to share info w Contact Information: [phone]  Emotional Assessment Appearance:: Appears stated age Attitude/Demeanor/Rapport: Unable to Assess Affect (typically observed): Unable to Assess Orientation: :  (Disoriented x4) Alcohol / Substance Use: Not Applicable Psych Involvement: No (comment)  Admission diagnosis:  Diverticulitis  [K57.92] Elevated troponin [R79.89] AKI (acute kidney injury) ([place]) [N17.9] C. difficile colitis [A04.72] Altered mental status, unspecified altered mental status type [R41.82] Patient Active Problem List   Diagnosis Date Noted   Pneumothorax, traumatic    Acute respiratory failure with hypoxia ([place])    Pressure injury of skin 10/01/2022   C. difficile colitis 09/25/2022   Leukocytosis 09/25/2022   SIRS (systemic inflammatory response syndrome) ([place]) 47/65/4650   Acute metabolic encephalopathy 35/46/5681   Renal insufficiency 09/25/2022   Compression fracture of body of thoracic vertebra ([place]) 09/25/2022   GERD (gastroesophageal reflux disease) 09/25/2022   Elevated troponin 09/25/2022   Diverticulitis 09/12/2022   Acute respiratory disease due to COVID-19 virus 05/20/2021   Acute renal failure superimposed on stage 3b chronic kidney disease ([place]) 27/51/7001   Complicated urinary tract infection 11/24/2019   Back pain 11/24/2019   Generalized weakness 11/24/2019   Persistent atrial fibrillation ([place]) 11/15/2019   Acquired thrombophilia ([place]) 11/15/2019   Sensorineural hearing loss (SNHL) of both ears 11/08/2018   Pain in left knee 07/28/2018   Type II diabetes mellitus (HCC)    Shingles    Pneumonia    Pilonidal cyst    AF (paroxysmal atrial fibrillation) (HCC)    Osteoarthritis    Hypercholesterolemia    Hard of hearing    Essential hypertension    Dysrhythmia    Chronic diastolic CHF (congestive heart failure) (HCC)    Arthritis    Compression fracture of fourth lumbar  vertebra (HCC) 05/28/2017   Acute on chronic diastolic CHF (congestive heart failure) (HCC) 11/27/2016   Chronic kidney disease, stage 3b (HCC) 11/25/2016   Anemia of chronic disease 11/25/2016   HTN (hypertension) 11/25/2016   HLD (hyperlipidemia) 11/25/2016   Type 2 diabetes mellitus with stage 3 chronic kidney disease, without long-term current use of insulin (HCC) 11/25/2016   Bilateral impacted  cerumen 09/15/2016   Encounter for therapeutic drug monitoring 03/20/2016   Synovitis of knee 02/27/2014   OA (osteoarthritis) of knee 02/08/2012   PCP:  Shon Hale, MD Pharmacy:   [id number] ([place]), [place] - [address] [place] ([place]) [place] [address] Phone: [phone] Fax: [phone]  [id number] Ginette Otto [facility] - [address] [place] [address] Phone: [phone] Fax: [phone]     Social Determinants of Health (SDOH) Interventions    Readmission Risk Interventions    09/14/2022    1:30 PM  Readmission Risk Prevention Plan  Post Dischage Appt Complete  Medication Screening Complete  Transportation Screening Complete

## 2022-10-13 NOTE — Progress Notes (Signed)
Nutrition Brief Note  Chart reviewed. Patient now transitioning to comfort care. Noted tube feeds were stopped and Cortrak tube was removed.  No further nutrition interventions planned at this time.  Please re-consult RD as needed.   [name], MS, RD, LDN, CNSC Pager number available on Amion

## 2022-10-13 NOTE — TOC Transition Note (Signed)
Transition of Care [facility]) - CM/SW Discharge Note   Patient Details  Name: Daniel Cooke MRN: [id number] Date of Birth: 06-20-1924  Transition of Care [facility]) CM/SW Contact:  Benard Halsted, LCSW Phone Number: 10/13/2022, 4:20 PM   Clinical Narrative:    Patient will DC to: [facility] Anticipated DC date: 10/13/22 Family notified: Daughter, Company secretary by: Corey Harold   Per MD patient ready for DC to [facility]. RN to call report prior to discharge [phone]). RN, patient, patient's family, and facility notified of DC. Discharge Summary sent to facility. DC packet on chart including signed DNR. Ambulance transport requested for patient.   CSW will sign off for now as social work intervention is no longer needed. Please consult [place] again if new needs arise.     Final next level of care: [place] Barriers to Discharge: Barriers Resolved   Patient Goals and CMS Choice Patient states their goals for this hospitalization and ongoing recovery are:: Comfort      Discharge Placement                Patient to be transferred to facility by: [place] Name of family member notified: Daughter Patient and family notified of of transfer: 10/13/22  Discharge Plan and Services                                     Social Determinants of Health (SDOH) Interventions     Readmission Risk Interventions    09/14/2022    1:30 PM  Readmission Risk Prevention Plan  Post Dischage Appt Complete  Medication Screening Complete  Transportation Screening Complete

## 2022-10-13 NOTE — Discharge Instructions (Signed)
Disposition.  Residential hospice Condition.  Guarded CODE STATUS.  DNR Activity.  With assistance as tolerated, full fall precautions. Diet. NPO except Meds   Goal of care.  Comfort.

## 2022-10-13 NOTE — Discharge Summary (Signed)
Daniel Cooke ZOX:[id number] DOB: June 16, 1924 DOA: 09/25/2022  PCP: Shon Hale, MD  Admit date: 09/25/2022  Discharge date: 10/13/2022  Admitted From: Home   Disposition:  Residential Hospice   Recommendations for Outpatient Follow-up:   Follow up with PCP in 1-2 weeks  PCP Please obtain BMP/CBC, 2 view CXR in 1week,  (see Discharge instructions)   PCP Please follow up on the following pending results:    Home Health: None   Equipment/Devices: None  Consultations: PCCM, palliative care, cardiology Discharge Condition: Guarded CODE STATUS: DNR Diet Recommendation: NPO except Meds    Chief Complaint  Patient presents with   Back Pain     Brief history of present illness from the day of admission and additional interim summary    88 y/o M with hx of CKD 3B, HLD, DM2, Afib, HTN,  but otherwise fully functional who presented with recurrent colitis found to have C difficile infection.  Hospital course complicated by delirium.  Unfortunately he had what seems to be a aspiration event then had cardiac arrest.  He was admitted on 09/25/2022 and intubated for aspiration pneumonia related complications and cardiac arrest on 09/28/2022, he self extubated himself on 10/04/2022.  He also developed pneumothorax in ICU requiring a right-sided chest tube placement on 09/29/2022 which he still has, for his ongoing dysphagia and aspiration he has a core track tube, currently encephalopathic and restrained to the bed, overall prognosis poor, transferred to my service on 10/07/2022 on day 12 of his hospital stay.  Despite all appropriate care no significant improvement now full comfort care, DC to residential Hospce   Procedures  :      10/13 admit with C. Diff 10/16 cardiac arrest 10/16 echocardiogram as  below 10/17 pneumothorax> chest tube placed. EEG with burst suppression  10/20 SBT but mental status precludes extubation. EEG/MRI negative.  10/21 Eyes open, squeezed left hand to command 10/22 Self Extubated, mildly agitated, on 2 L [place], required NTS for secretion management 10/23 Cortrack placed for feedings 10/07/2022 transferred to hospitalist service  TTE -     1. Left ventricular ejection fraction, by estimation, is 65 to 70%. The left ventricle has normal function. The left ventricle has no regional wall motion abnormalities. Left ventricular diastolic parameters are indeterminate. There is the  interventricular septum is flattened in systole, consistent with right ventricular pressure overload.   2. Right ventricular systolic function is mildly reduced. The right ventricular size is moderately enlarged. There is moderately elevated pulmonary artery systolic pressure. The estimated right ventricular systolic pressure is 56.0 mmHg.   3. Right atrial size was severely dilated.   4. The mitral valve is grossly normal. Mild mitral valve regurgitation.   5. Tricuspid valve regurgitation is mild to moderate.   6. The aortic valve is tricuspid. There is severe calcifcation of the aortic valve. There is severe thickening of the aortic valve. Aortic valve regurgitation is not visualized. Aortic valve sclerosis/calcification is present, without any evidence of  aortic stenosis.   7. Aortic dilatation  noted. There is borderline dilatation of the aortic root, measuring 37 mm.   8. The inferior vena cava is dilated in size with <50% respiratory variability, suggesting right atrial pressure of 15 mmHg.                                                                  Hospital Course    Recurrent C. difficile colitis.  On Dificid, clinically resolved, no further diarrhea Flexi-Seal removed on 10/08/2022, finished Dificid, resolved.   Acute metabolic and toxic encephalopathy.  Due to #1 and 2 above.   Supportive care.  Currently requiring restraints to prevent self-harm.  Minimize narcotics and benzodiazepines.  Overall prognosis remains [place] poor, discussed in detail with daughter Dondra Spry on 10/07/2022 on 10/09/2022, patient now DNR.  No improvement despite full Rx now full comfort care.  Aspiration pneumonia causing PEA arrest, acute hypoxic respiratory failure requiring endotracheal intubation followed by self extubation.  Currently on supplemental oxygen, finished his antibiotics for aspiration pneumonia on 10/06/2022.  Currently on core track, ental status still not good enough for speech to advance diet, overall still has large amounts of oral secretions, coarse breath sounds and poor mental status making him at very high risk for repeat aspiration, overall extremely poor prognosis.  Continue oral suctioning for secretions    Right-sided pneumothorax.  Chest tube being managed by PCCM.  Ch. tube removed on 10/08/2022.   Shock liver post PEA arrest.  Trend stable    Moderate protein calorie malnutrition.  Protein supplementation given here.   Dehydration with hypernatremia.  Free water flushes while here   Hypertension.  now comfort meds only.   Paroxysmal Afib - in ICU and now with aberrant conduction, TTE stable, [place] VASC 2 will be > 2 at least, On Eliquis, some episodes of S. [place] Foley ? Aberrant conduction, hold further B Blocker, appreciate cards input, now comfort meds only. Echocardiogram gram done on the day of cardiac arrest showed a preserved EF around 65% without any wall motion abnormalities or acute changes.    Anemia.  Mild intermittent macrocytosis    AKI on CKD 3B.  Baseline creatinine around 1.5.   now comfort meds only.   Urinary Retention - foley 10/07/22, continue for comfort.   DM type II.  was on Insulin, now comfort meds only.   Discharge diagnosis     Principal Problem:   C. difficile colitis Active Problems:   Leukocytosis   SIRS (systemic inflammatory  response syndrome) (HCC)   Acute metabolic encephalopathy   Renal insufficiency   Persistent atrial fibrillation (HCC)   Type 2 diabetes mellitus with stage 3 chronic kidney disease, without long-term current use of insulin (HCC)   HTN (hypertension)   Anemia of chronic disease   HLD (hyperlipidemia)   Compression fracture of body of thoracic vertebra (HCC)   GERD (gastroesophageal reflux disease)   Elevated troponin   Pressure injury of skin   Acute respiratory failure with hypoxia (HCC)   Pneumothorax, traumatic    Discharge instructions    Discharge Instructions     Discharge instructions   Complete by: As directed    Disposition.  Residential hospice Condition.  Guarded CODE STATUS.  DNR Activity.  With assistance as tolerated, full fall precautions. Diet. NPO except  Meds   Goal of care.  Comfort.   No wound care   Complete by: As directed        Discharge Medications   Allergies as of 10/13/2022       Reactions   Codeine Nausea And Vomiting, Other (See Comments)   MAKES ME CRAZY   Oxycodone Nausea And Vomiting, Other (See Comments)   MAKES ME CRAZY   Hydrocodone Nausea Only, Other (See Comments)   Daughter states "messes him up mentally"   Nsaids    Other reaction(s): Contraindicated d/t CKD   Other Other (See Comments)   All narcotic pain medicine makes him crazy.    Oxybutynin    Other reaction(s): dry mouth   Tramadol Hcl    Other reaction(s): altered mental status        Medication List     STOP taking these medications    amLODipine 2.5 MG tablet Commonly known as: NORVASC   BD Pen Needle Nano U/F 32G X 4 MM Misc Generic drug: Insulin Pen Needle   CALCIUM PO   Eliquis 2.5 MG Tabs tablet Generic drug: apixaban   FreeStyle Libre 14 Day Reader Hardie Pulley   furosemide 40 MG tablet Commonly known as: LASIX   HumaLOG KwikPen 100 UNIT/ML KwikPen Generic drug: insulin lispro   Lantus SoloStar 100 UNIT/ML Solostar Pen Generic drug: insulin  glargine   simvastatin 20 MG tablet Commonly known as: ZOCOR   traZODone 50 MG tablet Commonly known as: DESYREL   VITAMIN D3 PO       TAKE these medications    acetaminophen 500 MG tablet Commonly known as: TYLENOL Take 500 mg by mouth 2 (two) times daily.   carboxymethylcellulose 0.5 % Soln Commonly known as: REFRESH PLUS Place 1 drop into both eyes daily as needed (dry eyes).   finasteride 5 MG tablet Commonly known as: PROSCAR Take 5 mg by mouth daily.   Myrbetriq 50 MG Tb24 tablet Generic drug: mirabegron ER Take 50 mg by mouth at bedtime.   omeprazole 20 MG capsule Commonly known as: PRILOSEC Take 20 mg by mouth every morning.   scopolamine 1 MG/3DAYS Commonly known as: TRANSDERM-SCOP Place 1 patch (1.5 mg total) onto the skin every 3 (three) days. Start taking on: October 15, 2022          Major procedures and Radiology Reports - PLEASE review detailed and final reports thoroughly  -      DG Abd Portable 1V  Result Date: 10/13/2022 CLINICAL DATA:  Nausea.  C difficile colitis. EXAM: PORTABLE ABDOMEN - 1 VIEW COMPARISON:  10/09/2022 FINDINGS: A feeding tube remains in place with the tip overlying the gastric antrum. Evidence of dilated bowel loops. Right upper quadrant surgical clips are seen from prior cholecystectomy. Prior lumbar vertebroplasties again noted. IMPRESSION: Feeding tube tip overlies the gastric antrum. Unremarkable bowel gas pattern. Electronically Signed   By: Danae Orleans M.D.   On: 10/13/2022 09:29   DG Chest Port 1 View  Result Date: 10/11/2022 CLINICAL DATA:  88 year old male with history of shortness of breath. EXAM: PORTABLE CHEST 1 VIEW COMPARISON:  Chest x-ray 10/10/2022. FINDINGS: A feeding tube is seen extending into the abdomen, however, the tip of the feeding tube extends below the lower margin of the image. Lung volumes are low. Bibasilar opacities which may reflect areas of atelectasis and/or consolidation, with  superimposed moderate right and small left pleural effusions. No pneumothorax. There is cephalization of the pulmonary vasculature and slight indistinctness of the interstitial  markings suggestive of mild pulmonary edema. Mild cardiomegaly. The patient is rotated to the right on today's exam, resulting in distortion of the mediastinal contours and reduced diagnostic sensitivity and specificity for mediastinal pathology. Atherosclerotic calcifications are noted in the thoracic aorta. IMPRESSION: 1. The appearance the chest is suggestive of congestive heart failure, as above. 2. Aortic atherosclerosis. Electronically Signed   By: Trudie Reed M.D.   On: 10/11/2022 06:28   DG Chest Port 1 View  Result Date: 10/10/2022 CLINICAL DATA:  Short of breath EXAM: PORTABLE CHEST 1 VIEW COMPARISON:  Prior chest x-ray 10/09/2022 FINDINGS: Limited visualization of enteric feeding tube. Extremely low inspiratory volumes with increased bibasilar airspace opacities. Diffuse chronic bronchitic changes are similar compared to prior. Stable cardiomegaly. IMPRESSION: 1. Increasing bibasilar airspace opacities favored to reflect atelectasis, possibly with small bilateral layering pleural effusions. 2. Stable cardiomegaly. 3. Partially imaged feeding tube. Electronically Signed   By: Malachy Moan M.D.   On: 10/10/2022 07:41   DG Abd Portable 1V  Result Date: 10/09/2022 CLINICAL DATA:  Shortness of breath and constipation. EXAM: PORTABLE ABDOMEN - 1 VIEW COMPARISON:  10/07/2022 FINDINGS: Unchanged position of the enteric tube with tip projecting over the mid upper abdomen in the expected location of the gastric body. Bowel gas pattern is nonobstructed. Normal stool burden. Signs of previous cholecystectomy and vertebral augmentation. IMPRESSION: Stable position of enteric tube with tip projecting over the gastric body. Nonobstructive bowel gas pattern. Electronically Signed   By: Signa Kell M.D.   On: 10/09/2022 07:02    DG Chest Port 1 View  Result Date: 10/09/2022 CLINICAL DATA:  Shortness of breath. Altered mental status. Constipation. EXAM: PORTABLE CHEST 1 VIEW COMPARISON:  10/08/22 FINDINGS: There is a enteric tube with tip coursing below the hemidiaphragms. Heart size and mediastinal contours are stable. Right-sided thoracostomy tube has been removed. There is diffuse pulmonary vascular congestion. Atelectasis is noted within the lung bases, left greater than right. IMPRESSION: 1. Interval removal of right thoracostomy tube. No pneumothorax. 2. Pulmonary vascular congestion. 3. Bibasilar atelectasis, left greater than right. Electronically Signed   By: Signa Kell M.D.   On: 10/09/2022 07:01   DG Chest Port 1 View  Result Date: 10/08/2022 CLINICAL DATA:  Shortness of breath, chest tube EXAM: PORTABLE CHEST 1 VIEW COMPARISON:  Radiograph 10/07/2022 FINDINGS: Unchanged cardiomediastinal silhouette. Feeding tube passes below the diaphragm, tip excluded by collimation. Right pigtail chest tube has migrated inferiorly towards the base of the hemithorax. There are mild diffuse interstitial opacities. Stable small pleural effusions. Persistent left basilar airspace disease, not significantly changed. No evidence of pneumothorax. Bones are unchanged. IMPRESSION: Right pigtail chest tube overlies the right lower hemithorax. No evidence of pneumothorax. Interstitial pulmonary edema.  Stable small pleural effusions. Persistent left basilar airspace disease, not significantly changed from prior. Electronically Signed   By: Caprice Renshaw M.D.   On: 10/08/2022 08:41   DG Chest Port 1 View  Result Date: 10/07/2022 CLINICAL DATA:  Follow-up pneumothorax EXAM: PORTABLE CHEST 1 VIEW COMPARISON:  10/07/2022 FINDINGS: Pigtail catheter is again noted on the right. No definitive pneumothorax is seen. Feeding catheter extends into the stomach. Cardiac shadow is within normal limits. Patchy increased airspace opacity is noted in  the left base relatively stable from the prior exam. No new focal abnormality is seen. IMPRESSION: Overall stable appearance of the chest.  No pneumothorax is noted. Electronically Signed   By: Alcide Clever M.D.   On: 10/07/2022 19:17   DG Chest [facility]  1 View  Result Date: 10/07/2022 CLINICAL DATA:  Shortness of breath.  Chest tube. EXAM: PORTABLE CHEST 1 VIEW COMPARISON:  Earlier same day chest radiograph at 0511 hours; CT chest 09/28/2022 FINDINGS: Interval retraction of the right pigtail pleural drainage catheter which now projects at the lateral aspect of the right lower hemithorax. Enteric tube tip is below the diaphragm but not included on the image. Persistent dense retrocardiac airspace opacity and interstitial opacities in the left mid and lower lung. Small left pleural effusion. No pneumothorax. Stable cardiomediastinal silhouette. Aortic calcifications. Surgical clips overlie the right chest wall as well as the right upper abdomen. Changes of vertebral body augmentation of the upper lumbar spine. IMPRESSION: Interval retraction of the right chest tube which is now located at the lateral aspect of the lower right hemithorax. The pigtail projects within the thoracic cavity. No pneumothorax. Unchanged left basilar airspace opacities, likely reflecting combination of small left pleural effusion and atelectasis, though superimposed infection cannot be excluded. Electronically Signed   By: Sherron Ales M.D.   On: 10/07/2022 14:46   DG Abd Portable 1V  Result Date: 10/07/2022 CLINICAL DATA:  Nausea EXAM: PORTABLE ABDOMEN - 1 VIEW COMPARISON:  10/05/2022 FINDINGS: Soft feeding tube tip is in the antrum of the stomach. The bowel gas pattern is normal without evidence of ileus or obstruction. Normal amount of fecal matter. Old spinal augmentation. Previous cholecystectomy. IMPRESSION: Soft feeding tube tip in the antrum of the stomach. Electronically Signed   By: Paulina Fusi M.D.   On: 10/07/2022 11:47    DG CHEST PORT 1 VIEW  Result Date: 10/07/2022 CLINICAL DATA:  Pneumothorax. EXAM: PORTABLE CHEST 1 VIEW COMPARISON:  October 05, 2022 FINDINGS: Since the previous exam there has been interval removal of a LEFT sided venous catheter. RIGHT-sided chest tube remains in place. Feeding tube courses through in off the field of the radiograph placed since previous imaging. EKG leads project over the chest. Cardiomediastinal contours and hilar structures are stable. There is a persistent amount of subcutaneous emphysema about the chest which appears mildly decreased. No visible pneumothorax. LEFT basilar airspace disease is similar to recent imaging likely associated with small LEFT pleural effusion. On limited assessment no acute skeletal findings. IMPRESSION: 1. Interval removal of LEFT-sided venous catheter. 2. RIGHT-sided chest tube remains in place. No visible pneumothorax. 3. Persistent LEFT basilar airspace disease likely associated with small LEFT pleural effusion. 4. Decreased subcutaneous emphysema. Electronically Signed   By: Donzetta Kohut M.D.   On: 10/07/2022 08:17   DG Abd Portable 1V  Result Date: 10/05/2022 CLINICAL DATA:  Feeding tube placement EXAM: PORTABLE ABDOMEN - 1 VIEW COMPARISON:  09/28/2022 FINDINGS: Soft feeding tube enters the abdomen has its tip in the antrum of the stomach. IMPRESSION: Soft feeding tube tip in the antrum of the stomach. Electronically Signed   By: Paulina Fusi M.D.   On: 10/05/2022 11:56   DG CHEST PORT 1 VIEW  Result Date: 10/05/2022 CLINICAL DATA:  88 year old male with recent sepsis. Colitis. Status post CPR on 09/28/2022 with rib fractures and small pneumothorax. Chest tube. EXAM: PORTABLE CHEST 1 VIEW COMPARISON:  Portable chest 10/03/2022 and earlier. FINDINGS: Portable AP semi upright view at 0507 hours. Extubated and enteric tube removed. Pigtail right chest tube and left IJ central line remain. Improved lung volumes. No pneumothorax identified.  Improved bilateral ventilation, especially on the left, with decreased but not fully resolved left lung base veiling opacity. Mediastinal contours are within normal limits. Visualized tracheal air  column is within normal limits. Stable visualized osseous structures. Paucity of bowel gas in the upper abdomen. Right upper quadrant surgical clips again noted. IMPRESSION: 1. Extubated and enteric tube removed with improved lung volumes and ventilation. Residual left lung base opacity. 2. Stable right chest tube.  No pneumothorax identified. Electronically Signed   By: Odessa Fleming M.D.   On: 10/05/2022 06:30   DG CHEST PORT 1 VIEW  Result Date: 10/03/2022 CLINICAL DATA:  Follow-up pneumothorax.  History of CHF. EXAM: PORTABLE CHEST 1 VIEW COMPARISON:  Chest x-ray dated 10/01/2022 FINDINGS: Tubes and lines appear stable in position, including RIGHT-sided chest tube with tip coiled adjacent to the RIGHT hilum. Heart size and mediastinal contours appear stable. Hazy opacity at the LEFT lung base, likely atelectasis and/or layering pleural effusion. No pneumothorax is seen. IMPRESSION: 1. No pneumothorax seen. RIGHT-sided chest tube appears stable in position. 2. Stable hazy opacity at the LEFT lung base, likely atelectasis and/or layering pleural effusion. Electronically Signed   By: Bary Richard M.D.   On: 10/03/2022 09:32   EEG adult  Result Date: 10/01/2022 Charlsie Quest, MD     10/01/2022  8:21 AM Patient Name: BENTZION DAURIA MRN: [id number] Epilepsy Attending: Charlsie Quest Referring Physician/Provider: Rejeana Brock, MD Date: 09/30/2022 Duration: 22.56 mins Patient history: 88 year old male with persistent severe encephalopathy 2 days status post cardiac arrest. EEG to evaluate for seizure. Level of alertness:  lethargic AEDs during EEG study: None Technical aspects: This EEG study was done with scalp electrodes positioned according to the 10-20 International system of electrode placement.  Electrical activity was reviewed with band pass filter of 1-70Hz , sensitivity of 7 uV/mm, display speed of 60mm/sec with a 60Hz  notched filter applied as appropriate. EEG data were recorded continuously and digitally stored.  Video monitoring was available and reviewed as appropriate. Description: EEG showed continuous generalized 3 to 6 Hz theta-delta slowing. Hyperventilation and photic stimulation were not performed.   ABNORMALITY - Continuous slow, generalized IMPRESSION: This study is suggestive of moderate to severe diffuse encephalopathy, nonspecific etiology. No seizures or epileptiform discharges were seen throughout the recording. Charlsie Quest   DG CHEST PORT 1 VIEW  Result Date: 10/01/2022 CLINICAL DATA:  Chest tube. EXAM: PORTABLE CHEST 1 VIEW COMPARISON:  September 30, 2022. FINDINGS: Stable cardiomediastinal silhouette. Endotracheal and nasogastric tubes are unchanged. Left internal jugular catheter is unchanged. Right-sided chest tube is noted without definite pneumothorax. Stable left lung opacity is noted. Bilateral subcutaneous emphysema is noted. IMPRESSION: Stable support apparatus. Stable right-sided chest tube without pneumothorax. Stable left basilar opacity. Bilateral subcutaneous emphysema. Electronically Signed   By: Lupita Raider M.D.   On: 10/01/2022 08:10   MR BRAIN WO CONTRAST  Result Date: 09/30/2022 CLINICAL DATA:  Anoxic brain injury. EXAM: MRI HEAD WITHOUT CONTRAST TECHNIQUE: Multiplanar, multiecho pulse sequences of the brain and surrounding structures were obtained without intravenous contrast. COMPARISON:  CT head 09/28/2022 FINDINGS: Brain: Negative for acute infarct. Generalized atrophy. Mild ventricular enlargement consistent with the degree of atrophy. Chronic microhemorrhage left cerebellum, no other hemorrhage or mass lesion. Mild white matter hyperintensity appears chronic. Vascular: Normal arterial flow voids Skull and upper cervical spine: No focal skeletal  lesion Sinuses/Orbits: Mild mucosal edema paranasal sinuses. Bilateral mastoid effusion. Bilateral ocular surgery Other: None IMPRESSION: 1. Negative for acute infarct. 2. Atrophy and mild chronic microvascular ischemic change in the white matter. Electronically Signed   By: Marlan Palau M.D.   On: 09/30/2022 19:52   DG  Chest Port 1 View  Result Date: 09/30/2022 CLINICAL DATA:  Acute respiratory failure. EXAM: PORTABLE CHEST 1 VIEW COMPARISON:  July 30, 2022. FINDINGS: Stable cardiomediastinal silhouette. Endotracheal and nasogastric tubes are unchanged in position. Right-sided chest tube is unchanged. Extensive subcutaneous emphysema is again noted bilaterally. No definite pneumothorax is noted currently. Stable left lung opacity is noted with associated effusion. IMPRESSION: Stable support apparatus. No definite pneumothorax is noted currently, although this may be obscured due to extensive overlying subcutaneous emphysema. Left basilar opacity as described above. Electronically Signed   By: Lupita Raider M.D.   On: 09/30/2022 08:35   EEG adult  Result Date: 09/29/2022 Charlsie Quest, MD     09/29/2022  9:09 AM Patient Name: TYQUEZ HOLLIBAUGH MRN: [id number] Epilepsy Attending: Charlsie Quest Referring Physician/Provider: Lorin Glass, MD Date: 09/28/2022 Duration: 24.01 mins Patient history: 88 year old male status post cardiac arrest.  EEG to evaluate for seizure. Level of alertness:  comatose AEDs during EEG study: Propofol Technical aspects: This EEG study was done with scalp electrodes positioned according to the 10-20 International system of electrode placement. Electrical activity was reviewed with band pass filter of 1-70Hz , sensitivity of 7 uV/mm, display speed of 27mm/sec with a 60Hz  notched filter applied as appropriate. EEG data were recorded continuously and digitally stored.  Video monitoring was available and reviewed as appropriate. Description: EEG showed burst suppression  pattern with bursts of 3 to 5 Hz theta- delta slowing admixed with 12 to 14 Hz beta activity lasting 2 to 3 seconds admixed with background suppression lasting 2 to 4 seconds. Hyperventilation and photic stimulation were not performed.   ABNORMALITY -Burst suppression, generalized IMPRESSION: This study is suggestive of profound diffuse encephalopathy, nonspecific etiology but could be secondary to sedation. No seizures or epileptiform discharges were seen throughout the recording. Charlsie Quest   DG Chest Port 1 View  Result Date: 09/29/2022 CLINICAL DATA:  Left chest tube placement EXAM: PORTABLE CHEST 1 VIEW COMPARISON:  Radiographs earlier today FINDINGS: Decreased size of the now moderate right pneumothorax after left chest tube placement. Remainder unchanged. Improved leftward mediastinal shift. Aortic atherosclerotic calcification. Left basilar airspace opacities.  Marked subcutaneous emphysema. ETT in the mid intrathoracic trachea. Left IJ CVC tip in the right atrium. Subdiaphragmatic enteric tube. Defibrillator pads. IMPRESSION: Decreased moderate right pneumothorax after chest tube placement. Improved leftward mediastinal shift. Additional lines and tubes are stable. Left basilar airspace opacities. Extensive chest wall emphysema. Electronically Signed   By: Minerva Fester M.D.   On: 09/29/2022 04:02   DG Chest Port 1 View  Result Date: 09/29/2022 CLINICAL DATA:  Follow-up pneumothorax EXAM: PORTABLE CHEST 1 VIEW COMPARISON:  Radiograph 09/28/2022 FINDINGS: Interval increase in size of the right-sided pneumothorax which is now moderate-large. Possible small bilateral pleural effusions. Stable left mid and lower lung airspace opacities. New mild leftward mediastinal shift. Stable cardiomegaly. Aortic atherosclerotic calcification. Marked subcutaneous emphysema. Rib fractures were better appreciated on CT. Subdiaphragmatic enteric tube. Endotracheal tube tip in the intrathoracic trachea  approximately 3.4 cm from the carina. Left IJ CVC tip at the superior cavoatrial junction. Defibrillator pads. IMPRESSION: Increased size of the right pneumothorax which is now moderate-large with signs of leftward mediastinal shift. Left mid and lower lung airspace opacities. Extensive chest wall emphysema. These results will be called to the ordering clinician or representative by the Radiologist Assistant, and communication documented in the PACS or [employer]. Electronically Signed   By: Minerva Fester M.D.   On: 09/29/2022  01:56   DG Chest 1V REPEAT Same Day  Result Date: 09/28/2022 CLINICAL DATA:  Pneumothorax EXAM: CHEST - 1 VIEW SAME DAY COMPARISON:  Chest x-ray and chest CT same day FINDINGS: Small right pneumothorax is visualized laterally as seen on recent CT. No mediastinal shift. Left mid and lower lung airspace disease persists. No pleural effusion. Cardiomediastinal silhouette is stable. Enteric tube extends below the diaphragm. Endotracheal tube tip is 4.3 cm above the carina. Right-sided rib fractures are again noted as seen on recent CT. Extensive chest wall emphysema is stable. IMPRESSION: 1. Small right pneumothorax as seen on recent CT. 2. Stable left mid and lower lung airspace disease. 3. Stable extensive chest wall emphysema. Electronically Signed   By: Darliss Cheney M.D.   On: 09/28/2022 18:24   CT CHEST LIMITED WO CONTRAST  Result Date: 09/28/2022 CLINICAL DATA:  Dyspnea. EXAM: CT CHEST WITHOUT CONTRAST TECHNIQUE: Multidetector CT imaging of the chest was performed following the standard protocol without IV contrast. RADIATION DOSE REDUCTION: This exam was performed according to the departmental dose-optimization program which includes automated exposure control, adjustment of the mA and/or kV according to patient size and/or use of iterative reconstruction technique. COMPARISON:  Chest x-ray same day. CT abdomen and pelvis 09/27/2022. FINDINGS: Cardiovascular: No  significant vascular findings. Normal heart size. No pericardial effusion. There are atherosclerotic calcifications of the aorta and coronary arteries. Left-sided central venous catheter is present with distal tip ending in the SVC. Mediastinum/Nodes: Enteric tube is present. Visualized thyroid gland is within normal limits. Esophagus is nondilated. No enlarged lymph nodes are identified allowing for lack of intravenous contrast. Lungs/Pleura: There is a small pneumothorax (10%) mainly seen in the lower anterior hemithorax. No mediastinal shift. There are small bilateral pleural effusions, right greater than left. There is airspace consolidation in the bilateral lower lobes, left greater than right as well as minimally in both upper lobes. Tracheostomy tube tip is 3.8 cm above the carina. Trachea and central airways are patent. Upper Abdomen: Cholecystectomy clips are present. Enteric tube is partially visualized in the stomach. Musculoskeletal: There are acute anterior right second, third, fourth, fifth, sixth and seventh rib fractures. There are acute left anterior second, third, fourth, fifth, sixth and seventh rib fractures. There is an acute nondisplaced manubrial fracture. There is a healed upper sternal fracture. There is extensive right sided chest wall emphysema extending minimally into the left anterior chest wall and upper neck. IMPRESSION: 1. Small left pneumothorax (10%). No mediastinal shift. 2. Multiple acute bilateral anterior rib fractures and acute nondisplaced manubrial fracture. 3. Small bilateral pleural effusions. 4. Bilateral lower lobe and to a lesser extent upper lobe airspace consolidation worrisome for multifocal pneumonia. 5. Extensive right-sided chest wall emphysema. Aortic Atherosclerosis (ICD10-I70.0). Electronically Signed   By: Darliss Cheney M.D.   On: 09/28/2022 17:32   CT HEAD WO CONTRAST ( )  Result Date: 09/28/2022 CLINICAL DATA:  Syncope/presyncope, cerebrovascular cause  suspected EXAM: CT HEAD WITHOUT CONTRAST TECHNIQUE: Contiguous axial images were obtained from the base of the skull through the vertex without intravenous contrast. RADIATION DOSE REDUCTION: This exam was performed according to the departmental dose-optimization program which includes automated exposure control, adjustment of the mA and/or kV according to patient size and/or use of iterative reconstruction technique. COMPARISON:  09/25/2022 FINDINGS: Brain: No evidence of acute infarction, hemorrhage, hydrocephalus, extra-axial collection or mass lesion/mass effect. Scattered low-density changes within the periventricular and subcortical white matter compatible with chronic microvascular ischemic change. Mild diffuse cerebral volume loss. Vascular: Atherosclerotic  calcifications involving the large vessels of the skull base. No unexpected hyperdense vessel. Skull: Normal. Negative for fracture or focal lesion. Sinuses/Orbits: No new or acute finding. Other: Subcutaneous emphysema of the right neck. Partially imaged endotracheal and orogastric tubes. IMPRESSION: 1. No acute intracranial abnormality. 2. Chronic microvascular ischemic change and cerebral volume loss. 3. Subcutaneous emphysema of the right neck. Please see concurrently obtained CT of the chest for further assessment. Electronically Signed   By: Davina Poke D.O.   On: 09/28/2022 17:26   ECHOCARDIOGRAM LIMITED  Result Date: 09/28/2022    ECHOCARDIOGRAM REPORT   Patient Name:   LADARIOUS KRESSE [facility] Date of Exam: 09/28/2022 Medical Rec #:  [id number]        Height:       70.0 in Accession #:    [id number]       Weight:       179.0 lb Date of Birth:  05-Aug-1924        BSA:          1.991 m Patient Age:    89 years         BP:           99/68 mmHg Patient Gender: M                HR:           78 bpm. Exam Location:  Inpatient Procedure: Limited Color Doppler, Cardiac Doppler and Limited Echo STAT ECHO Indications:     Cardiac arrest I46.9  History:          Patient has prior history of Echocardiogram examinations, most                  recent 11/26/2016. CHF, Arrythmias:Atrial Fibrillation; Risk                  Factors:Diabetes and Hypertension.  Sonographer:     Bernadene Person RDCS Referring Phys:  [id number] Candee Furbish Diagnosing Phys: Gwyndolyn Kaufman MD IMPRESSIONS  1. Left ventricular ejection fraction, by estimation, is 65 to 70%. The left ventricle has normal function. The left ventricle has no regional wall motion abnormalities. Left ventricular diastolic parameters are indeterminate. There is the interventricular septum is flattened in systole, consistent with right ventricular pressure overload.  2. Right ventricular systolic function is mildly reduced. The right ventricular size is moderately enlarged. There is moderately elevated pulmonary artery systolic pressure. The estimated right ventricular systolic pressure is 71.2 mmHg.  3. Right atrial size was severely dilated.  4. The mitral valve is grossly normal. Mild mitral valve regurgitation.  5. Tricuspid valve regurgitation is mild to moderate.  6. The aortic valve is tricuspid. There is severe calcifcation of the aortic valve. There is severe thickening of the aortic valve. Aortic valve regurgitation is not visualized. Aortic valve sclerosis/calcification is present, without any evidence of aortic stenosis.  7. Aortic dilatation noted. There is borderline dilatation of the aortic root, measuring 37 mm.  8. The inferior vena cava is dilated in size with <50% respiratory variability, suggesting right atrial pressure of 15 mmHg. Comparison(s): Compared to prior TTE report in 2017, there continues to be moderate pulmonary HTN, EF remains normal, there is significant aortic sclerosis/calcification without significant stenosis. FINDINGS  Left Ventricle: Left ventricular ejection fraction, by estimation, is 65 to 70%. The left ventricle has normal function. The left ventricle has no regional wall motion  abnormalities. The left ventricular internal cavity size was normal in size. There is  no left ventricular hypertrophy. The interventricular septum is flattened in systole, consistent with right ventricular pressure overload. Left ventricular diastolic parameters are indeterminate. Right Ventricle: The right ventricular size is moderately enlarged. No increase in right ventricular wall thickness. Right ventricular systolic function is mildly reduced. There is moderately elevated pulmonary artery systolic pressure. The tricuspid regurgitant velocity is 3.20 m/s, and with an assumed right atrial pressure of 15 mmHg, the estimated right ventricular systolic pressure is 56.0 mmHg. Left Atrium: Left atrial size was normal in size. Right Atrium: Right atrial size was severely dilated. Pericardium: There is no evidence of pericardial effusion. Mitral Valve: The mitral valve is grossly normal. There is mild thickening of the mitral valve leaflet(s). There is mild calcification of the mitral valve leaflet(s). Mild mitral annular calcification. Mild mitral valve regurgitation. Tricuspid Valve: The tricuspid valve is normal in structure. Tricuspid valve regurgitation is mild to moderate. Aortic Valve: The aortic valve is tricuspid. There is severe calcifcation of the aortic valve. There is severe thickening of the aortic valve. Aortic valve regurgitation is not visualized. Aortic valve sclerosis/calcification is present, without any evidence of aortic stenosis. Aortic valve mean gradient measures 5.0 mmHg. Aortic valve peak gradient measures 9.0 mmHg. Aortic valve area, by VTI measures 2.36 cm. Pulmonic Valve: The pulmonic valve was normal in structure. Pulmonic valve regurgitation is trivial. Aorta: Aortic dilatation noted. There is borderline dilatation of the aortic root, measuring 37 mm. Venous: The inferior vena cava is dilated in size with less than 50% respiratory variability, suggesting right atrial pressure of 15  mmHg. IAS/Shunts: There is left bowing of the interatrial septum, suggestive of elevated right atrial pressure. The atrial septum is grossly normal. Additional Comments: There is a small pleural effusion in the left lateral region.  LEFT VENTRICLE PLAX 2D LVIDd:         4.30 cm LVIDs:         2.60 cm LV PW:         0.90 cm LV IVS:        1.00 cm LVOT diam:     2.00 cm LV SV:         64 LV SV Index:   32 LVOT Area:     3.14 cm  RIGHT VENTRICLE TAPSE (M-mode): 1.3 cm LEFT ATRIUM         Index       RIGHT ATRIUM           Index LA diam:    4.60 cm 2.31 cm/m  RA Area:     20.40 cm                                 RA Volume:   57.40 ml  28.83 ml/m  AORTIC VALVE AV Area (Vmax):    2.18 cm AV Area (Vmean):   2.14 cm AV Area (VTI):     2.36 cm AV Vmax:           150.00 cm/s AV Vmean:          96.900 cm/s AV VTI:            0.270 m AV Peak Grad:      9.0 mmHg AV Mean Grad:      5.0 mmHg LVOT Vmax:         104.00 cm/s LVOT Vmean:        66.000 cm/s LVOT VTI:  0.203 m LVOT/AV VTI ratio: 0.75  AORTA Ao Root diam: 3.80 cm TRICUSPID VALVE TR Peak grad:   41.0 mmHg TR Vmax:        320.00 cm/s  SHUNTS Systemic VTI:  0.20 m Systemic Diam: 2.00 cm Laurance Flatten MD Electronically signed by Laurance Flatten MD Signature Date/Time: 09/28/2022/4:07:48 PM    Final (Updated)    DG Chest Port 1 View  Result Date: 09/28/2022 CLINICAL DATA:  Endotracheal tube placement EXAM: PORTABLE CHEST 1 VIEW COMPARISON:  09/28/2022 FINDINGS: Endotracheal tube, feeding tube and central venous line unchanged. Interval increase in subcutaneous gas in the RIGHT chest wall. No pneumothorax appreciated. IMPRESSION: 1. Increased subcutaneous gas along the RIGHT anterior chest wall and axilla. 2. Stable support apparatus. Electronically Signed   By: Genevive Bi M.D.   On: 09/28/2022 16:07   DG Abd 1 View  Result Date: 09/28/2022 CLINICAL DATA:  Feeding tube placement. EXAM: ABDOMEN - 1 VIEW COMPARISON:  None Available. FINDINGS:  Distal tip of nasogastric tube is seen in expected position of proximal stomach. IMPRESSION: Distal tip of nasogastric tube seen in expected position of proximal stomach. Electronically Signed   By: Lupita Raider M.D.   On: 09/28/2022 14:39   DG CHEST PORT 1 VIEW  Result Date: 09/28/2022 CLINICAL DATA:  Intubation. EXAM: PORTABLE CHEST 1 VIEW COMPARISON:  May 20, 2021. FINDINGS: Stable cardiomediastinal silhouette. Endotracheal and nasogastric tubes are in grossly good position. Bilateral perihilar and basilar opacities are noted concerning for edema or possibly pneumonia. Small left pleural effusion is noted. Bony thorax is unremarkable. IMPRESSION: Endotracheal and nasogastric tubes are in grossly good position. Bilateral lung opacities are noted concerning for edema or possibly pneumonia, with small left pleural effusion. Electronically Signed   By: Lupita Raider M.D.   On: 09/28/2022 14:38   CT ABDOMEN PELVIS WO CONTRAST  Result Date: 09/27/2022 CLINICAL DATA:  Sepsis with abdominal pain. EXAM: CT ABDOMEN AND PELVIS WITHOUT CONTRAST TECHNIQUE: Multidetector CT imaging of the abdomen and pelvis was performed following the standard protocol without IV contrast. RADIATION DOSE REDUCTION: This exam was performed according to the departmental dose-optimization program which includes automated exposure control, adjustment of the mA and/or kV according to patient size and/or use of iterative reconstruction technique. COMPARISON:  09/25/2022 FINDINGS: Lower chest: Bibasilar collapse/consolidation with small bilateral pleural effusions, progressive in the interval. Hepatobiliary: No suspicious focal abnormality in the liver on this study without intravenous contrast. Gallbladder surgically absent. No intrahepatic or extrahepatic biliary dilation. Pancreas: No focal mass lesion. No dilatation of the main duct. No intraparenchymal cyst. No peripancreatic edema. Spleen: No splenomegaly. No focal mass lesion.  Adrenals/Urinary Tract: No adrenal nodule or mass. Atrophic kidneys bilaterally without hydronephrosis. No evidence for hydroureter. Contrast material in the bladder lumen is compatible with recent contrast infused CT. Bladder wall trabeculation and right-sided bladder wall diverticuli suggest underlying component of bladder outlet obstruction. Stomach/Bowel: Stomach is decompressed. Duodenum is normally positioned as is the ligament of Treitz. No small bowel wall thickening. No small bowel dilatation. The terminal ileum is normal. The appendix is not well visualized, but there is no edema or inflammation in the region of the cecum. Left-sided colonic diverticular disease noted subtle pericolonic edema/inflammation associated with the sigmoid segment is similar to prior, likely reflecting diverticulitis. No evidence for abscess. Rectal tube noted. Vascular/Lymphatic: There is moderate atherosclerotic calcification of the abdominal aorta without aneurysm. There is no gastrohepatic or hepatoduodenal ligament lymphadenopathy. No retroperitoneal or mesenteric lymphadenopathy. No pelvic sidewall  lymphadenopathy. Reproductive: The prostate gland and seminal vesicles are unremarkable. Other: No intraperitoneal free fluid. Musculoskeletal: Bones are diffusely demineralized. Status post vertebral augmentation at L1 and L4. Compression deformity noted at T12 and L5, similar to prior. IMPRESSION: 1. Left-sided colonic diverticular disease with subtle pericolonic edema/inflammation associated with the sigmoid segment, similar to prior, likely reflecting diverticulitis. No evidence for abscess. 2. Bibasilar collapse/consolidation with small bilateral pleural effusions, progressive in the interval. 3. Bladder wall trabeculation and right-sided bladder wall diverticuli suggest underlying component of bladder outlet obstruction. 4.  Aortic Atherosclerosis (ICD10-I70.0). Electronically Signed   By: Kennith CenterEric  Mansell M.D.   On:  09/27/2022 13:24   CT ABDOMEN PELVIS W CONTRAST  Result Date: 09/25/2022 CLINICAL DATA:  Diverticulitis, complication suspected. New onset of urinary incontinence. EXAM: CT ABDOMEN AND PELVIS WITH CONTRAST TECHNIQUE: Multidetector CT imaging of the abdomen and pelvis was performed using the standard protocol following bolus administration of intravenous contrast. RADIATION DOSE REDUCTION: This exam was performed according to the departmental dose-optimization program which includes automated exposure control, adjustment of the mA and/or kV according to patient size and/or use of iterative reconstruction technique. CONTRAST:  50mL OMNIPAQUE IOHEXOL 350 MG/ML SOLN COMPARISON:  CT of the abdomen and pelvis without contrast 09/12/2022 and 09/10/2022 FINDINGS: Lower chest: Mild dependent atelectasis and small effusions have increased slightly. Heart size is normal. Coronary artery calcifications are present. Hepatobiliary: No focal liver abnormality is seen. Status post cholecystectomy. No biliary dilatation. Pancreas: Unremarkable. No pancreatic ductal dilatation or surrounding inflammatory changes. Spleen: Normal in size without focal abnormality. Adrenals/Urinary Tract: Adrenal glands are normal bilaterally. Thinning of the renal parenchyma noted. 15 mm simple cyst is present at the lower pole of the left kidney. No other focal lesions are present. No obstruction is present. Ureters are within normal limits. Right-sided bladder diverticuli are stable. Urinary bladder is otherwise within normal limits. Stomach/Bowel: The stomach and duodenum are within normal limits. Small bowel is unremarkable. Terminal ileum is normal. The appendix is not discretely visualized and may be surgically absent. Ascending and transverse colon are within normal limits. Descending colon is unremarkable. Inflammatory changes surrounding the sigmoid colon have increased since the prior exam. No free fluid or free air is present. A rectal  catheter is in place. Vascular/Lymphatic: Atherosclerotic calcifications are present in the abdominal aorta and branch vessels without aneurysm. No significant adenopathy is present. Reproductive: Prostate is unremarkable. Other: No abdominal wall hernia or abnormality. No abdominopelvic ascites. Musculoskeletal: Compression fractures at T12 and L5 are stable. Spinal augmentation noted at L1 and L4. No acute abnormalities are present. IMPRESSION: 1. Increasing inflammatory changes surrounding the sigmoid colon compatible with acute diverticulitis. No complicating features are present. 2. Stable right-sided bladder diverticuli. 3. Stable compression fractures at T12 and L5. 4. Coronary artery disease. 5.  Aortic Atherosclerosis (ICD10-I70.0). Electronically Signed   By: Marin Robertshristopher  Mattern M.D.   On: 09/25/2022 07:47   CT Head Wo Contrast  Result Date: 09/25/2022 CLINICAL DATA:  Altered mental status. New onset urinary incontinence. EXAM: CT HEAD WITHOUT CONTRAST TECHNIQUE: Contiguous axial images were obtained from the base of the skull through the vertex without intravenous contrast. RADIATION DOSE REDUCTION: This exam was performed according to the departmental dose-optimization program which includes automated exposure control, adjustment of the mA and/or kV according to patient size and/or use of iterative reconstruction technique. COMPARISON:  None Available. FINDINGS: Brain: Mild atrophy and white matter changes are within normal limits for age. No acute infarct, hemorrhage, or mass lesion is present.  Deep brain nuclei are within normal limits. The ventricles are proportionate to the degree of atrophy. No significant extraaxial fluid collection is present. The brainstem and cerebellum are within normal limits. Vascular: Atherosclerotic calcifications are present within the cavernous internal carotid arteries. No hyperdense vessel is present. Skull: No significant extracranial soft tissue lesion is  present. Calvarium is intact. No focal lytic or blastic lesions are present. Sinuses/Orbits: Right frontal and anterior ethmoid air cell disease appears chronic. Mild mucosal thickening is present in the anterior ethmoid air cells bilaterally. Maxillary sinuses and sphenoid sinuses are clear. Mastoid air cells are clear. Right-sided scleral banding is noted. Bilateral lens replacements are present. Globes and orbits are otherwise within normal limits. IMPRESSION: 1. Normal CT appearance of the brain for age. 2. Right frontal and anterior ethmoid air cell disease appears chronic. Electronically Signed   By: Marin Roberts M.D.   On: 09/25/2022 07:40    Micro Results     Today   Subjective    Bing [place]ighbors today remioans completely confused   Objective   Blood pressure (!) 142/76, pulse 85, temperature 98.5 F (36.9 C), temperature source Axillary, resp. rate 20, height  (1.778 m), weight 97.4 kg, SpO2 94 %.   Intake/Output Summary (Last 24 hours) at 10/13/2022 1601 Last data filed at 10/13/2022 1113 Gross per 24 hour  Intake 1600 ml  Output 950 ml  Net 650 ml    Exam  Awake confused and agitated, restrained to the bed, core track in place, moves all 4 extremities [place].AT,PERRAL Supple Neck, No JVD,   Symmetrical Chest wall movement, Good air movement bilaterally, CTAB RRR,No Gallops, Rubs or new Murmurs,  +ve B.Sounds, Abd Soft, No tenderness,   No Cyanosis, Clubbing or edema    Data Review   Recent Labs  Lab 10/08/22 0903 10/09/22 0735 10/10/22 0316 10/11/22 0601 10/12/22 0222 10/13/22 0632  WBC 12.5* 12.1* 14.4* 10.6* 9.3 9.0  HGB 10.5* 10.6* 10.5* 10.1* 9.7* 9.5*  HCT 33.1* 34.5* 33.7* 32.1* 31.5* 30.3*  PLT 213 236 182 243 240 262  MCV 102.5* 104.2* 103.4* 102.6* 103.3* 104.5*  MCH 32.5 32.0 32.2 32.3 31.8 32.8  MCHC 31.7 30.7 31.2 31.5 30.8 31.4  RDW 15.0 15.0 15.0 15.2 15.2 14.8  LYMPHSABS 1.7 1.1 1.4 1.6 1.5  --   MONOABS 0.7 0.5 0.6 0.8 0.7  --    EOSABS 0.2 0.1 0.1 0.1 0.1  --   BASOSABS 0.1 0.1 0.1 0.1 0.1  --     Recent Labs  Lab 10/07/22 0842 10/08/22 0516 10/08/22 0903 10/08/22 1235 10/09/22 0735 10/10/22 0316 10/10/22 0648 10/11/22 0153 10/12/22 0222 10/13/22 0632  NA  --   --  145  --  146* 145  --  146* 146* 144  K  --   --  4.7  --  4.8 5.0  --  5.7* 4.9 4.8  CL  --   --  111  --  113* 109  --  111 108 103  CO2  --   --  26  --  25 24  --  GLUCOSE  --   --  258*  --  220* 200*  --  172* 119* 143*  BUN  --   --  67*  --  71* 74*  --  80* 85* 85*  CREATININE  --   --  1.69*  --  1.88* 1.80*  --  1.88* 1.96* 2.05*  CALCIUM  --   --  9.3  --  9.6 9.5  --  9.5 9.3 9.2  AST   < >  --  57*  --  63* 59*  --  56* 59* 72*  ALT   < >  --  43  --  45* 44  --  36 39 46*  ALKPHOS   < >  --  133*  --  146* 146*  --  144* 145* 218*  BILITOT   < >  --  1.3*  --  1.3* 1.6*  --  1.5* 1.3* 1.3*  ALBUMIN   < >  --  2.2*  --  2.2* 2.2*  --  2.1* 2.0* 2.0*  MG  --   --  2.2  --  2.3 2.3  --  2.5*  --  2.7*  PROCALCITON  --   --   --  0.33 0.27  --  0.26 0.31 0.25  --   BNP  --  649.7*  --   --  753.1* 496.9*  --  562.3*  --  1,003.0*   < > = values in this interval not displayed.    Total Time in preparing paper work, data evaluation and todays exam - 35 minutes  Susa Raring M.D on 10/13/2022 at 4:01 PM  [facility]

## 2022-10-13 NOTE — Progress Notes (Addendum)
   10/13/22 1028  Urine Characteristics  Urinary Interventions Bladder scan  Bladder Scan Volume (mL) 473 mL   MD notified  Irrigated foley with 17mL per order

## 2022-10-13 NOTE — Progress Notes (Signed)
PROGRESS NOTE                                                                                                                                                                                                             Patient Demographics:    Daniel Cooke, is a 88 y.o. male, DOB - 08/25/1924, YQI:[id number]  Outpatient Primary MD for the patient is Daniel Smoker, MD    LOS - 74  Admit date - 09/25/2022    Chief Complaint  Patient presents with   Back Pain       Brief Narrative (HPI from H&P)   88 y/o M with hx of CKD 3B, HLD, DM2, Afib, HTN,  but otherwise fully functional who presented with recurrent colitis found to have C difficile infection.  Hospital course complicated by delirium.  Unfortunately he had what seems to be a aspiration event then had cardiac arrest.  He was admitted on 09/25/2022 and intubated for aspiration pneumonia related complications and cardiac arrest on 09/28/2022, he self extubated himself on 10/04/2022.  He also developed pneumothorax in ICU requiring a right-sided chest tube placement on 09/29/2022 which he still has, for his ongoing dysphagia and aspiration he has a core track tube, currently encephalopathic and restrained to the bed, overall prognosis poor, transferred to my service on 10/07/2022 on day 12 of his hospital stay.    Procedures  :     10/13 admit with C. Diff 10/16 cardiac arrest 10/16 echocardiogram as below 10/17 pneumothorax> chest tube placed. EEG with burst suppression  10/20 SBT but mental status precludes extubation. EEG/MRI negative.  10/21 Eyes open, squeezed left hand to command 10/22 Self Extubated, mildly agitated, on 2 L [place], required NTS for secretion management 10/23 Cortrack placed for feedings 10/07/2022 transferred to hospitalist service   TTE -    1. Left ventricular ejection fraction, by estimation, is 65 to 70%. The left ventricle has normal  function. The left ventricle has no regional wall motion abnormalities. Left ventricular diastolic parameters are indeterminate. There is the  interventricular septum is flattened in systole, consistent with right ventricular pressure overload.   2. Right ventricular systolic function is mildly reduced. The right ventricular size is moderately enlarged. There is moderately elevated pulmonary artery systolic pressure. The estimated right ventricular systolic pressure is 38.7 mmHg.   3. Right  atrial size was severely dilated.   4. The mitral valve is grossly normal. Mild mitral valve regurgitation.   5. Tricuspid valve regurgitation is mild to moderate.   6. The aortic valve is tricuspid. There is severe calcifcation of the aortic valve. There is severe thickening of the aortic valve. Aortic valve regurgitation is not visualized. Aortic valve sclerosis/calcification is present, without any evidence of  aortic stenosis.   7. Aortic dilatation noted. There is borderline dilatation of the aortic root, measuring 37 mm.   8. The inferior vena cava is dilated in size with <50% respiratory variability, suggesting right atrial pressure of 15 mmHg.    Subjective:    Daniel Cooke today in bed restrained to bed confused unable to answer questions or follow commands.   Assessment  & Plan :   Recurrent C. difficile colitis.  On Dificid, clinically resolved, no further diarrhea Flexi-Seal removed on 10/08/2022, continue Dificid, stop date 10/23/2022.  Aspiration pneumonia causing PEA arrest, acute hypoxic respiratory failure requiring endotracheal intubation followed by self extubation.  Currently on supplemental oxygen, finished his antibiotics for aspiration pneumonia on 10/06/2022.  Currently on core track, ental status still not good enough for speech to advance diet, overall still has large amounts of oral secretions, coarse breath sounds and poor mental status making him at very high risk for repeat  aspiration, overall extremely poor prognosis, unfortunately family still wants to persist with continued medical treatment.  Continue oral suctioning, elevation of head of the bed, intermittent chest x-rays and monitor clinically.  Empiric antibiotics will be used with extreme caution due to history of recurrent C. difficile.  Echocardiogram gram done on the day of cardiac arrest showed a preserved EF around 65% without any wall motion abnormalities or acute changes.   Right-sided pneumothorax.  Chest tube being managed by PCCM.  Ch. tube removed on 10/08/2022.  Acute metabolic and toxic encephalopathy.  Due to #1 and 2 above.  Supportive care.  Currently requiring restraints to prevent self-harm.  Minimize narcotics and benzodiazepines.  Overall prognosis remains [place] poor, discussed in detail with daughter Daniel Cooke on 10/07/2022 on 10/09/2022, patient now DNR.  Called her again on 10/11/2022 as patient is not progressing at all and in fact appears that he is actively suffering, daughter still wants to continue the present medical treatment including core track feeding, extensively counseled her.  I have reminded palliative care to see the patient again for goals of care discussion, they had seen the patient on 10/05/2022.  Shock liver post PEA arrest.  Trend stable will monitor.  Mild elevation in alk phos could be due to tube feeds.  Moderate protein calorie malnutrition.  Protein supplementation.  Dehydration with hypernatremia.  Free water increased further as hypernatremia is reoccurring  Hypertension.  In poor control added Norvasc close sinus bradycardia hence no beta-blocker.  Monitor and adjust..  Paroxysmal Afib - in ICU and now with aberrant conduction, TTE stable, [place] VASC 2 will be > 2 at least, On Eliquis, some episodes of S. Loletha Grayer ? Aberrant conduction, hold further B Blocker, appreciate cards input, likely no further changes needed.  Anemia.  Mild intermittent macrocytosis,  monitor.  AKI on CKD 3B.  Baseline creatinine around 1.5.  Hydrate and monitor.  Urinary Retention - foley 10/07/22, Flomax once taking by mouth.  DM type II.  On Lantus along with every 4 sliding scale and every 4 NovoLog.  Monitor and adjust.  Lab Results  Component Value Date   HGBA1C 8.3 (H)  09/12/2022   CBG (last 3)  Recent Labs    10/13/22 0225 10/13/22 0502 10/13/22 0745  GLUCAP 176* 179* 178*         Condition - Extremely Guarded  Family Communication  : Daughter over the phone on 10/07/2022, 10/09/2022 now DNR, 10/11/22  Code Status : DNR  Consults  : PCCM, palliative care, cardiology  PUD Prophylaxis :        Disposition Plan  :    Status is: Inpatient  DVT Prophylaxis  :    apixaban (ELIQUIS) tablet 2.5 mg Start: 09/30/22 1000 apixaban (ELIQUIS) tablet 2.5 mg    Lab Results  Component Value Date   PLT 262 10/13/2022    Diet :  Diet Order             Diet NPO time specified  Diet effective now                    Inpatient Medications  Scheduled Meds:  amLODipine  10 mg Per Tube Daily   apixaban  2.5 mg Per Tube BID   bethanechol  5 mg Per Tube TID   Chlorhexidine Gluconate Cloth  6 each Topical Daily   cyanocobalamin  1,000 mcg Per Tube Daily   famotidine  10.4 mg Per Tube QODAY   feeding supplement (PROSource TF20)  45 mL Per Tube Daily   fidaxomicin  200 mg Per NG tube BID   folic acid  1 mg Per Tube Daily   free water  200 mL Per Tube Q4H   guaiFENesin  5 mL Per Tube Q12H   insulin aspart  0-15 Units Subcutaneous Q4H   insulin aspart  5 Units Subcutaneous Q4H   insulin glargine-yfgn  16 Units Subcutaneous Daily   mouth rinse  15 mL Mouth Rinse Q2H   scopolamine  1 patch Transdermal Q72H   Continuous Infusions:  sodium chloride Stopped (10/05/22 1236)   feeding supplement (OSMOLITE 1.5 CAL) 1,000 mL (10/13/22 0531)   PRN Meds:.sodium chloride, acetaminophen **OR** acetaminophen, albuterol, haloperidol lactate,  morphine injection, mouth rinse, polyvinyl alcohol   Objective:   Vitals:   10/13/22 0000 10/13/22 0400 10/13/22 0435 10/13/22 0745  BP: (!) 131/50 (!) 122/56  (!) 142/76  Pulse: 85 (!) 55  85  Resp: _0 Temp: 98.5 F (36.9 C) 98.7 F (37.1 C)  98.5 F (36.9 C)  TempSrc: Axillary Axillary  Axillary  SpO2: 90% 95%  94%  Weight:   97.4 kg   Height:        Wt Readings from Last 3 Encounters:  10/13/22 97.4 kg  07/15/21 81.2 kg  05/20/21 81.6 kg     Intake/Output Summary (Last 24 hours) at 10/13/2022 1021 Last data filed at 10/13/2022 0700 Gross per 24 hour  Intake 6173.33 ml  Output 800 ml  Net 5373.33 ml     Physical Exam  Awake confused and agitated, restrained to the bed, core track in place, moves all 4 extremities [place].AT,PERRAL Supple Neck, No JVD,   Symmetrical Chest wall movement, Good air movement bilaterally, CTAB RRR,No Gallops, Rubs or new Murmurs,  +ve B.Sounds, Abd Soft, No tenderness,   No Cyanosis, Clubbing or edema      RN pressure injury documentation: Pressure Injury 09/30/22 Coccyx Mid Stage 2 -  Partial thickness loss of dermis presenting as a shallow open injury with a red, pink wound bed without slough. UTA (Active)  09/30/22 2000  Location: Coccyx  Location Orientation:  Mid  Staging: Stage 2 -  Partial thickness loss of dermis presenting as a shallow open injury with a red, pink wound bed without slough.  Wound Description (Comments): UTA  Present on Admission:   Dressing Type Foam - Lift dressing to assess site every shift 10/12/22 2039      Data Review:    CBC Recent Labs  Lab 10/08/22 0903 10/09/22 0735 10/10/22 0316 10/11/22 0601 10/12/22 0222 10/13/22 0632  WBC 12.5* 12.1* 14.4* 10.6* 9.3 9.0  HGB 10.5* 10.6* 10.5* 10.1* 9.7* 9.5*  HCT 33.1* 34.5* 33.7* 32.1* 31.5* 30.3*  PLT 213 236 182 243 240 262  MCV 102.5* 104.2* 103.4* 102.6* 103.3* 104.5*  MCH 32.5 32.0 32.2 32.3 31.8 32.8  MCHC 31.7 30.7 31.2 31.5 30.8  31.4  RDW 15.0 15.0 15.0 15.2 15.2 14.8  LYMPHSABS 1.7 1.1 1.4 1.6 1.5  --   MONOABS 0.7 0.5 0.6 0.8 0.7  --   EOSABS 0.2 0.1 0.1 0.1 0.1  --   BASOSABS 0.1 0.1 0.1 0.1 0.1  --     Electrolytes Recent Labs  Lab 10/07/22 0842 10/08/22 0516 10/08/22 0903 10/08/22 1235 10/09/22 0735 10/10/22 0316 10/10/22 0648 10/11/22 0153 10/12/22 0222 10/13/22 0632  NA  --   --  145  --  146* 145  --  146* 146* 144  K  --   --  4.7  --  4.8 5.0  --  5.7* 4.9 4.8  CL  --   --  111  --  113* 109  --  111 108 103  CO2  --   --  26  --  25 24  --  _0 GLUCOSE  --   --  258*  --  220* 200*  --  172* 119* 143*  BUN  --   --  67*  --  71* 74*  --  80* 85* 85*  CREATININE  --   --  1.69*  --  1.88* 1.80*  --  1.88* 1.96* 2.05*  CALCIUM  --   --  9.3  --  9.6 9.5  --  9.5 9.3 9.2  AST   < >  --  57*  --  63* 59*  --  56* 59* 72*  ALT   < >  --  43  --  45* 44  --  36 39 46*  ALKPHOS   < >  --  133*  --  146* 146*  --  144* 145* 218*  BILITOT   < >  --  1.3*  --  1.3* 1.6*  --  1.5* 1.3* 1.3*  ALBUMIN   < >  --  2.2*  --  2.2* 2.2*  --  2.1* 2.0* 2.0*  MG  --   --  2.2  --  2.3 2.3  --  2.5*  --  2.7*  PROCALCITON  --   --   --  0.33 0.27  --  0.26 0.31 0.25  --   BNP  --  649.7*  --   --  753.1* 496.9*  --  562.3*  --  1,003.0*   < > = values in this interval not displayed.    Radiology Reports  DG Abd Portable 1V  Result Date: 10/13/2022 CLINICAL DATA:  Nausea.  C difficile colitis. EXAM: PORTABLE ABDOMEN - 1 VIEW COMPARISON:  10/09/2022 FINDINGS: A feeding tube remains in place with the tip overlying the gastric antrum. Evidence of dilated  bowel loops. Right upper quadrant surgical clips are seen from prior cholecystectomy. Prior lumbar vertebroplasties again noted. IMPRESSION: Feeding tube tip overlies the gastric antrum. Unremarkable bowel gas pattern. Electronically Signed   By: Marlaine Hind M.D.   On: 10/13/2022 09:29   DG Chest Port 1 View  Result Date: 10/11/2022 CLINICAL DATA:   88 year old male with history of shortness of breath. EXAM: PORTABLE CHEST 1 VIEW COMPARISON:  Chest x-ray 10/10/2022. FINDINGS: A feeding tube is seen extending into the abdomen, however, the tip of the feeding tube extends below the lower margin of the image. Lung volumes are low. Bibasilar opacities which may reflect areas of atelectasis and/or consolidation, with superimposed moderate right and small left pleural effusions. No pneumothorax. There is cephalization of the pulmonary vasculature and slight indistinctness of the interstitial markings suggestive of mild pulmonary edema. Mild cardiomegaly. The patient is rotated to the right on today's exam, resulting in distortion of the mediastinal contours and reduced diagnostic sensitivity and specificity for mediastinal pathology. Atherosclerotic calcifications are noted in the thoracic aorta. IMPRESSION: 1. The appearance the chest is suggestive of congestive heart failure, as above. 2. Aortic atherosclerosis. Electronically Signed   By: Vinnie Langton M.D.   On: 10/11/2022 06:28   DG Chest Port 1 View  Result Date: 10/10/2022 CLINICAL DATA:  Short of breath EXAM: PORTABLE CHEST 1 VIEW COMPARISON:  Prior chest x-ray 10/09/2022 FINDINGS: Limited visualization of enteric feeding tube. Extremely low inspiratory volumes with increased bibasilar airspace opacities. Diffuse chronic bronchitic changes are similar compared to prior. Stable cardiomegaly. IMPRESSION: 1. Increasing bibasilar airspace opacities favored to reflect atelectasis, possibly with small bilateral layering pleural effusions. 2. Stable cardiomegaly. 3. Partially imaged feeding tube. Electronically Signed   By: Jacqulynn Cadet M.D.   On: 10/10/2022 07:41      Signature  Lala Lund M.D on 10/13/2022 at 10:21 AM   -  To page go to [email or web address]

## 2022-10-13 NOTE — Progress Notes (Signed)
Speech Language Pathology Treatment: Dysphagia  Patient Details Name: Daniel Cooke MRN: [id number] DOB: Jan 18, 1924 Today's Date: 10/13/2022 Time: 1219-7588 SLP Time Calculation (min) (ACUTE ONLY): 30 min  Assessment / Plan / Recommendation Clinical Impression  Per RN, family has decided to transition Daniel Cooke to comfort care.  Wrist restraints removed and RN present to remove cortrak. DanielMaeda was slightly more responsive today- he said hello upon entering the room.  Attempted to provide oral care but he bit off end of sponge from [place] - it was successfully removed from mouth. Offered a few ice chips, which he chewed and swallowed. He continues to cough but is unable to expel any of the mucus in his throat, which has been problematic since he was extubated.  It could not be retrieved with the yankauers using the traditional plastic end piece, and efforts to retrieve phlegm appeared to create more discomfort for him, so I ceased suctioning.  Repositioned and washed his hands and face. When leaving room he appeared to be resting comfortably.  Recommend offering ice and sips of drinks if Daniel Cooke appears to derive some benefit; but hold if it makes him uncomfortable.  SLP service will respectfully sign off. D/W RN.    HPI HPI: 88 year old man with hx of CKD, HLD, DM2 but otherwise fully functional who presented with recurrent colitis found to have C difficile infection.  Hospital course complicated by delirium.  10/16 had possible aspiration event/ cardiac arrest.  ROSC within 10 mins. Intubated 10/16-22, when he self-extubated.  Swallow initially assessed 10/15.      SLP Plan  Discharge SLP treatment due to (comment)      Recommendations for follow up therapy are one component of a multi-disciplinary discharge planning process, led by the attending physician.  Recommendations may be updated based on patient status, additional functional criteria and insurance authorization.     Recommendations  Diet recommendations:  (comfort POs)                Oral Care Recommendations: Oral care QID Follow Up Recommendations: No SLP follow up Plan: Discharge SLP treatment due to (comment)         Daniel Cooke L. Tivis Ringer, MA CCC/SLP Clinical Specialist - Acute Care SLP Acute Rehabilitation Services Office number [phone]   Daniel Cooke  10/13/2022, 12:05 PM

## 2022-10-13 NOTE — Progress Notes (Signed)
Patient ID: Daniel Cooke, male   DOB: 1924/07/22, 88 y.o.   MRN: [id number]    Progress Note from the Palliative Medicine Team at [facility]   Patient Name: Daniel Cooke        Date: 10/13/2022 DOB: March 15, 1924  Age: 88 y.o. MRN#: [id number] Attending Physician: Thurnell Lose, MD Primary Care Physician: Glenis Smoker, MD Admit Date: 09/25/2022   Medical records reviewed   88 year old man with hx of CKD, HLD, DM2 but otherwise fully functional who presented with recurrent colitis found to have C difficile infection.  Hospital course complicated by delirium.  Unfortunately  had what seems to be a aspiration event then had cardiac arrest.  ROSC within 10 mins.      Family face ongoing  treatment option decisions, advanced directive decisions and anticipatory care needs.   10/13 admit with C. Diff. 10/16 cardiac arrest 10/17 pneumothorax> chest tube placed. EEG with burst suppression.  10/20 SBT but mental status precludes extubation. EEG/MRI negative.  10/21 Eyes open, squeezed left hand to command 10/22 Self Extubated, mildly agitated, on 2 L [place], required NTS for secretion management 10/23 Cortrack placed for feedings 10/25 transferred out of ICU to [place] unit 10-13-22 unfortunately patient continues to decline within the context of full medical support    This NP assessed patient at the bedside as a follow up for palliative medicine needs support.    Patient is minimally responsive, appears generally uncomfortable   Patient with hand mittens.  Audible throat secretions.    Spoke to patient's daughter Daniel Cooke by phone continue conversation regarding current medical situation, specifically to concern for  likely long-term poor prognosis.    Education offered in detail the difference between an aggressive medical intervention path and a palliative comfort path for this patient, at this time in this situation.  Education offered on the limitations of medical  interventions to prolong quality of life when a body fails to thrive.  Education offered on hospice benefit; philosophy and eligibility.  Daughter Daniel Cooke very much understands the situation and is in agreement with a comfort path, she wishes to speak to her Sister Daniel Cooke first before making any definitive decisions.         One hour later [facility] calls me and we speak by phone regarding her father's current medical situation.  She communicates that she and her sister have spoken and that both are in agreement that the focus of care at this time should be comfort and dignity for [name].    I discussed with Dr. Candiss Norse and bedside RN that orders for full comfort were being placed.  Family also verbalizes desire for residential hospice facility, namely [facility].  I will place that order to case management.  I shared with family that patient is transitioning at end-of-life and that anything could happen at any time, prognosis is likely days.      Plan of Care: -DNR/DNI -No further artificial feeding or hydration -No further diagnostics, labs C BGs -Medication for symptom management, see MAR -Comfort and dignity are focus of care, allowing for natural death  Questions and concerns addressed     Total time spent on the unit was 75  minutes, greater than 50% of the time was spent in counseling coordination of care  PMT will continue to support holistically   Wadie Lessen NP  Palliative Medicine Team Team Phone # [phone] Pager [phone]

## 2022-10-13 NOTE — Progress Notes (Signed)
Report given to Jonelle Sidle, Therapist, sports at [facility].

## 2022-11-13 DEATH — deceased

## 2022-11-25 ENCOUNTER — Telehealth: Payer: Self-pay | Admitting: *Deleted

## 2022-11-25 NOTE — Patient Instructions (Signed)
Visit Information  Thank you for taking time to visit with me today. Please don't hesitate to contact me if I can be of assistance to you.   Following are the goals we discussed today:   Goals Addressed   None     Please call the care guide team at [phone] if you need to cancel or reschedule your appointment.   If you are experiencing a Mental Health or Behavioral Health Crisis or need someone to talk to, please  na  The patient verbalized understanding of instructions, educational materials, and care plan provided today and DECLINED offer to receive copy of patient instructions, educational materials, and care plan.  Note pt is deceased.  No further follow up required: No f/u required  Elliot Cousin, RN Care Management Coordinator [facility] Main Office [phone]

## 2022-11-25 NOTE — Patient Outreach (Signed)
  Care Coordination   Initial Visit Note   11/25/2022 Name: Daniel Cooke MRN: [id number] DOB: 06/18/1924  Daniel Cooke is a 88 y.o. year old male who sees Chanetta Marshall, Meridee Score, MD for primary care. I  spoke with a family member  What matters to the patients health and wellness today?  na    Goals Addressed   None     SDOH assessments and interventions completed:  No     Care Coordination Interventions:  No, not indicated   Follow up plan: No further intervention required.   Encounter Outcome:  Pt. Visit Completed. Pt deceased.  Elliot Cousin, RN Care Management Coordinator [facility] Main Office [phone]

## 2022-12-29 ENCOUNTER — Ambulatory Visit: Payer: Medicare Other | Admitting: Cardiology
